# Patient Record
Sex: Female | Born: 1935
Health system: Southern US, Community
[De-identification: ages and names within clinical notes are randomized; demographics above are authoritative.]

## PROBLEM LIST (undated history)

## (undated) DIAGNOSIS — H3530 Unspecified macular degeneration: Secondary | ICD-10-CM

## (undated) DIAGNOSIS — N200 Calculus of kidney: Secondary | ICD-10-CM

## (undated) DIAGNOSIS — IMO0002 Reserved for concepts with insufficient information to code with codable children: Secondary | ICD-10-CM

## (undated) DIAGNOSIS — K219 Gastro-esophageal reflux disease without esophagitis: Secondary | ICD-10-CM

## (undated) DIAGNOSIS — R0600 Dyspnea, unspecified: Secondary | ICD-10-CM

## (undated) DIAGNOSIS — I4891 Unspecified atrial fibrillation: Secondary | ICD-10-CM

## (undated) DIAGNOSIS — N390 Urinary tract infection, site not specified: Secondary | ICD-10-CM

## (undated) DIAGNOSIS — I509 Heart failure, unspecified: Secondary | ICD-10-CM

## (undated) DIAGNOSIS — H35039 Hypertensive retinopathy, unspecified eye: Secondary | ICD-10-CM

## (undated) DIAGNOSIS — I10 Essential (primary) hypertension: Secondary | ICD-10-CM

## (undated) DIAGNOSIS — R609 Edema, unspecified: Secondary | ICD-10-CM

## (undated) DIAGNOSIS — K8020 Calculus of gallbladder without cholecystitis without obstruction: Secondary | ICD-10-CM

## (undated) DIAGNOSIS — R229 Localized swelling, mass and lump, unspecified: Secondary | ICD-10-CM

## (undated) DIAGNOSIS — E119 Type 2 diabetes mellitus without complications: Secondary | ICD-10-CM

## (undated) DIAGNOSIS — E785 Hyperlipidemia, unspecified: Secondary | ICD-10-CM

## (undated) HISTORY — DX: Unspecified macular degeneration: H35.30

## (undated) HISTORY — DX: Heart failure, unspecified: I50.9

## (undated) HISTORY — PX: KNEE ARTHROSCOPY: SUR90

## (undated) HISTORY — DX: Hypertensive retinopathy, unspecified eye: H35.039

## (undated) HISTORY — PX: TONSILLECTOMY: SUR1361

---

## 2003-08-04 ENCOUNTER — Other Ambulatory Visit: Payer: Self-pay

## 2004-03-19 ENCOUNTER — Ambulatory Visit: Payer: Self-pay | Admitting: Family Medicine

## 2005-04-02 ENCOUNTER — Ambulatory Visit: Payer: Self-pay | Admitting: Family Medicine

## 2005-04-21 ENCOUNTER — Ambulatory Visit: Payer: Self-pay | Admitting: Gastroenterology

## 2005-05-26 ENCOUNTER — Emergency Department: Payer: Self-pay | Admitting: Emergency Medicine

## 2005-05-26 ENCOUNTER — Other Ambulatory Visit: Payer: Self-pay

## 2006-04-06 ENCOUNTER — Ambulatory Visit: Payer: Self-pay | Admitting: Family Medicine

## 2007-04-08 ENCOUNTER — Ambulatory Visit: Payer: Self-pay | Admitting: Family Medicine

## 2008-04-11 ENCOUNTER — Ambulatory Visit: Payer: Self-pay | Admitting: Family Medicine

## 2009-04-25 ENCOUNTER — Ambulatory Visit: Payer: Self-pay | Admitting: Family Medicine

## 2010-01-10 ENCOUNTER — Ambulatory Visit: Payer: Self-pay | Admitting: Gastroenterology

## 2010-01-14 LAB — PATHOLOGY REPORT

## 2010-05-16 ENCOUNTER — Ambulatory Visit: Payer: Self-pay | Admitting: Family Medicine

## 2011-07-22 ENCOUNTER — Ambulatory Visit: Payer: Self-pay | Admitting: Family Medicine

## 2012-08-12 ENCOUNTER — Ambulatory Visit: Payer: Self-pay | Admitting: Family Medicine

## 2013-08-29 ENCOUNTER — Emergency Department: Payer: Self-pay | Admitting: Emergency Medicine

## 2013-08-30 LAB — URINALYSIS, COMPLETE
RBC,UR: 11700 /HPF (ref 0–5)
Specific Gravity: 1.011 (ref 1.003–1.030)
Squamous Epithelial: NONE SEEN
WBC UR: 526 /HPF (ref 0–5)

## 2013-08-30 LAB — CBC
HCT: 37.4 % (ref 35.0–47.0)
HGB: 12.4 g/dL (ref 12.0–16.0)
MCH: 29.5 pg (ref 26.0–34.0)
MCHC: 33.1 g/dL (ref 32.0–36.0)
MCV: 89 fL (ref 80–100)
Platelet: 230 10*3/uL (ref 150–440)
RBC: 4.21 10*6/uL (ref 3.80–5.20)
RDW: 14.2 % (ref 11.5–14.5)
WBC: 15.8 10*3/uL — ABNORMAL HIGH (ref 3.6–11.0)

## 2013-08-30 LAB — BASIC METABOLIC PANEL
Anion Gap: 4 — ABNORMAL LOW (ref 7–16)
BUN: 18 mg/dL (ref 7–18)
Calcium, Total: 8.9 mg/dL (ref 8.5–10.1)
Chloride: 96 mmol/L — ABNORMAL LOW (ref 98–107)
Co2: 32 mmol/L (ref 21–32)
Creatinine: 1.14 mg/dL (ref 0.60–1.30)
EGFR (African American): 53 — ABNORMAL LOW
EGFR (Non-African Amer.): 45 — ABNORMAL LOW
Glucose: 107 mg/dL — ABNORMAL HIGH (ref 65–99)
Osmolality: 267 (ref 275–301)
Potassium: 3.3 mmol/L — ABNORMAL LOW (ref 3.5–5.1)
Sodium: 132 mmol/L — ABNORMAL LOW (ref 136–145)

## 2013-09-01 LAB — URINE CULTURE

## 2013-11-24 ENCOUNTER — Ambulatory Visit: Payer: Self-pay | Admitting: Family Medicine

## 2014-05-08 DIAGNOSIS — E119 Type 2 diabetes mellitus without complications: Secondary | ICD-10-CM | POA: Insufficient documentation

## 2014-05-08 DIAGNOSIS — K219 Gastro-esophageal reflux disease without esophagitis: Secondary | ICD-10-CM | POA: Insufficient documentation

## 2014-11-06 ENCOUNTER — Other Ambulatory Visit: Payer: Self-pay | Admitting: Family Medicine

## 2014-11-06 DIAGNOSIS — Z1231 Encounter for screening mammogram for malignant neoplasm of breast: Secondary | ICD-10-CM

## 2014-11-29 ENCOUNTER — Other Ambulatory Visit: Payer: Self-pay | Admitting: Family Medicine

## 2014-11-29 ENCOUNTER — Ambulatory Visit
Admission: RE | Admit: 2014-11-29 | Discharge: 2014-11-29 | Disposition: A | Discharge status: Home / Self Care | Payer: Medicare Other | Source: Ambulatory Visit | Attending: Family Medicine | Admitting: Family Medicine

## 2014-11-29 DIAGNOSIS — Z1231 Encounter for screening mammogram for malignant neoplasm of breast: Secondary | ICD-10-CM | POA: Diagnosis present

## 2015-05-23 DIAGNOSIS — E785 Hyperlipidemia, unspecified: Secondary | ICD-10-CM | POA: Insufficient documentation

## 2015-10-14 ENCOUNTER — Encounter: Payer: Self-pay | Admitting: Emergency Medicine

## 2015-10-14 ENCOUNTER — Emergency Department: Payer: Medicare Other

## 2015-10-14 ENCOUNTER — Emergency Department
Admission: EM | Admit: 2015-10-14 | Discharge: 2015-10-14 | Disposition: A | Discharge status: Home / Self Care | Payer: Medicare Other | Attending: Emergency Medicine | Admitting: Emergency Medicine

## 2015-10-14 DIAGNOSIS — R531 Weakness: Secondary | ICD-10-CM | POA: Diagnosis not present

## 2015-10-14 DIAGNOSIS — E119 Type 2 diabetes mellitus without complications: Secondary | ICD-10-CM | POA: Diagnosis not present

## 2015-10-14 DIAGNOSIS — R197 Diarrhea, unspecified: Secondary | ICD-10-CM | POA: Insufficient documentation

## 2015-10-14 DIAGNOSIS — I10 Essential (primary) hypertension: Secondary | ICD-10-CM | POA: Insufficient documentation

## 2015-10-14 HISTORY — DX: Urinary tract infection, site not specified: N39.0

## 2015-10-14 HISTORY — DX: Type 2 diabetes mellitus without complications: E11.9

## 2015-10-14 HISTORY — DX: Calculus of kidney: N20.0

## 2015-10-14 HISTORY — DX: Essential (primary) hypertension: I10

## 2015-10-14 LAB — LIPASE, BLOOD: Lipase: 33 U/L (ref 11–51)

## 2015-10-14 LAB — URINALYSIS COMPLETE WITH MICROSCOPIC (ARMC ONLY)
Bacteria, UA: NONE SEEN
Bilirubin Urine: NEGATIVE
Glucose, UA: NEGATIVE mg/dL
Hgb urine dipstick: NEGATIVE
Ketones, ur: NEGATIVE mg/dL
Leukocytes, UA: NEGATIVE
Nitrite: NEGATIVE
Protein, ur: NEGATIVE mg/dL
Specific Gravity, Urine: 1.004 — ABNORMAL LOW (ref 1.005–1.030)
WBC, UA: NONE SEEN WBC/hpf (ref 0–5)
pH: 6 (ref 5.0–8.0)

## 2015-10-14 LAB — COMPREHENSIVE METABOLIC PANEL
ALT: 20 U/L (ref 14–54)
AST: 24 U/L (ref 15–41)
Albumin: 3.6 g/dL (ref 3.5–5.0)
Alkaline Phosphatase: 144 U/L — ABNORMAL HIGH (ref 38–126)
Anion gap: 10 (ref 5–15)
BUN: 16 mg/dL (ref 6–20)
CO2: 26 mmol/L (ref 22–32)
Calcium: 9.1 mg/dL (ref 8.9–10.3)
Chloride: 91 mmol/L — ABNORMAL LOW (ref 101–111)
Creatinine, Ser: 1.06 mg/dL — ABNORMAL HIGH (ref 0.44–1.00)
GFR calc Af Amer: 55 mL/min — ABNORMAL LOW (ref >60)
GFR calc non Af Amer: 48 mL/min — ABNORMAL LOW (ref >60)
Glucose, Bld: 151 mg/dL — ABNORMAL HIGH (ref 65–99)
Potassium: 3.7 mmol/L (ref 3.5–5.1)
Sodium: 127 mmol/L — ABNORMAL LOW (ref 135–145)
Total Bilirubin: 0.5 mg/dL (ref 0.3–1.2)
Total Protein: 7.2 g/dL (ref 6.5–8.1)

## 2015-10-14 LAB — CBC
HCT: 36 % (ref 35.0–47.0)
Hemoglobin: 12.4 g/dL (ref 12.0–16.0)
MCH: 29.6 pg (ref 26.0–34.0)
MCHC: 34.4 g/dL (ref 32.0–36.0)
MCV: 86.2 fL (ref 80.0–100.0)
Platelets: 498 10*3/uL — ABNORMAL HIGH (ref 150–440)
RBC: 4.18 MIL/uL (ref 3.80–5.20)
RDW: 14.4 % (ref 11.5–14.5)
WBC: 13.4 10*3/uL — ABNORMAL HIGH (ref 3.6–11.0)

## 2015-10-14 MED ORDER — SODIUM CHLORIDE 0.9 % IV SOLN
1000.0000 mL | Freq: Once | INTRAVENOUS | Status: AC
  Administered 2015-10-14: 1000 mL via INTRAVENOUS

## 2015-10-14 NOTE — ED Notes (Signed)
MD Kinner at bedside  

## 2015-10-14 NOTE — ED Provider Notes (Signed)
[facility] Emergency Department Provider Note  ____________________________________________    I have reviewed the triage vital signs and the nursing notes.   HISTORY  Chief Complaint Abdominal Pain and Diarrhea    HPI Victoria Dalton is a 82 y.o. female who presents with complaints of fatigue, malaise for nearly 3 weeks. She reports she seen her doctor twice for this and has been on antibiotics for possible urinary tract infection with little improvement. She notes over the last several days she has also had diarrhea. She denies abdominal pain, no cough, no chest pain, no shortness of breath. No rash. No dysuria. No recent travel.     Past Medical History  Diagnosis Date  . Hypertension   . Diabetes mellitus without complication ([place])   . Kidney stone   . UTI (lower urinary tract infection)     There are no active problems to display for this patient.   Past Surgical History  Procedure Laterality Date  . Knee arthroscopy Left   . Tonsillectomy      No current outpatient prescriptions on file.  Allergies Penicillins and Sulfa antibiotics  History reviewed. No pertinent family history.  Social History Social History  Substance Use Topics  . Smoking status: Never Smoker   . Smokeless tobacco: None  . Alcohol Use: No    Review of Systems  Constitutional: Positive for chills Eyes: Negative for redness ENT: Negative for sore throat Cardiovascular: Negative for chest pain Respiratory: Negative for shortness of breath. Negative for cough Gastrointestinal: Negative for abdominal pain, positive for diarrhea Genitourinary: Negative for dysuria. Musculoskeletal: Negative for back pain. Skin: Negative for rash. Neurological: Negative for focal weakness Psychiatric: no anxiety    ____________________________________________   PHYSICAL EXAM:  VITAL SIGNS: ED Triage Vitals  Enc Vitals Group     BP 10/14/15 0646 124/76 mmHg      Pulse Rate 10/14/15 0646 78     Resp 10/14/15 0646 18     Temp 10/14/15 0646 97.7 F (36.5 C)     Temp Source 10/14/15 0646 Oral     SpO2 10/14/15 0646 96 %     Weight 10/14/15 0646 244 lb (110.678 kg)     Height 10/14/15 0646 5\' 9"  (1.753 m)     Head Cir --      Peak Flow --      Pain Score 10/14/15 0647 0     Pain Loc --      Pain Edu? --      Excl. in [place]? --      Constitutional: Alert and oriented. No acute distress Eyes: Conjunctivae are normal. No erythema or injection ENT   Head: Normocephalic and atraumatic.   Mouth/Throat: Mucous membranes are moist. Cardiovascular: Normal rate, regular rhythm. Normal and symmetric distal pulses are present in the upper extremities.  Respiratory: Normal respiratory effort without tachypnea nor retractions. Breath sounds are clear and equal bilaterally.  Gastrointestinal: Soft and non-tender in all quadrants. No distention. There is no CVA tenderness. Genitourinary: deferred Musculoskeletal: Nontender with normal range of motion in all extremities. No lower extremity tenderness nor edema. Neurologic:  Normal speech and language. No gross focal neurologic deficits are appreciated. Skin:  Skin is warm, dry and intact. No rash noted. Psychiatric: Mood and affect are normal. Patient exhibits appropriate insight and judgment.  ____________________________________________    LABS (pertinent positives/negatives)  Labs Reviewed  CBC - Abnormal; Notable for the following:    WBC 13.4 (*)    Platelets 498 (*)  All other components within normal limits  GASTROINTESTINAL PANEL BY PCR, STOOL (REPLACES STOOL CULTURE)  LIPASE, BLOOD  COMPREHENSIVE METABOLIC PANEL  URINALYSIS COMPLETEWITH MICROSCOPIC ([facility] ONLY)  TROPONIN I    ____________________________________________   EKG  None  ____________________________________________    RADIOLOGY  Chest x-ray is  benign  ____________________________________________   PROCEDURES  Procedure(s) performed: none  Critical Care performed: none  ____________________________________________   INITIAL IMPRESSION / ASSESSMENT AND PLAN / ED COURSE  Pertinent labs & imaging results that were available during my care of the patient were reviewed by me and considered in my medical decision making (see chart for details).  Patient presented with complaints of generalized malaise for nearly 3 weeks. She is also having diarrhea, and has been on antibiotics for possible urinary tract infection. We will check labs, urine, x-ray, stool and reevaluate  On reevaluation patient is feeling well and improved from initial. Her lab work is overall unremarkable. She does have a mildly low sodium but this is in keeping with prior sodiums. No evidence of infection on chest x-ray or urinalysis. Patient was unable to give [place] any stool. She has follow-up with her PCP in one day. Feel this is appropriate for discharge  ____________________________________________   FINAL CLINICAL IMPRESSION(S) / ED DIAGNOSES  Final diagnoses:  Weakness  Diarrhea, unspecified type          Lavonia Drafts, MD 10/14/15 1536

## 2015-10-14 NOTE — Discharge Instructions (Signed)

## 2015-10-14 NOTE — ED Notes (Signed)
Pt assisted into wheelchair upon arrival; says she's been feeling bad since 09/25/15; has been seen by her provider and is on her second round of antibiotics for UTI; took Cipro first and now taking levaquin-2 doses left; pt says last night she began having liquid stools, has had multiple since it started; burning abd pain; c/o generalized weakness; pt pale in triage; talkative;

## 2015-10-14 NOTE — ED Notes (Signed)
Pt's daughter states the pt has had significant weight loss since 5/3 when she weighed 262  Now weights 244 today.

## 2015-11-12 ENCOUNTER — Encounter: Payer: Self-pay | Admitting: Emergency Medicine

## 2015-11-12 ENCOUNTER — Other Ambulatory Visit: Payer: Self-pay

## 2015-11-12 ENCOUNTER — Emergency Department
Admission: EM | Admit: 2015-11-12 | Discharge: 2015-11-12 | Disposition: A | Discharge status: Home / Self Care | Payer: Medicare Other | Attending: Emergency Medicine | Admitting: Emergency Medicine

## 2015-11-12 DIAGNOSIS — Z048 Encounter for examination and observation for other specified reasons: Secondary | ICD-10-CM | POA: Insufficient documentation

## 2015-11-12 DIAGNOSIS — E119 Type 2 diabetes mellitus without complications: Secondary | ICD-10-CM | POA: Diagnosis not present

## 2015-11-12 DIAGNOSIS — R0989 Other specified symptoms and signs involving the circulatory and respiratory systems: Secondary | ICD-10-CM | POA: Diagnosis not present

## 2015-11-12 DIAGNOSIS — I10 Essential (primary) hypertension: Secondary | ICD-10-CM | POA: Diagnosis not present

## 2015-11-12 DIAGNOSIS — R198 Other specified symptoms and signs involving the digestive system and abdomen: Secondary | ICD-10-CM

## 2015-11-12 DIAGNOSIS — R109 Unspecified abdominal pain: Secondary | ICD-10-CM | POA: Diagnosis present

## 2015-11-12 LAB — CBC
HCT: 35.1 % (ref 35.0–47.0)
Hemoglobin: 11.7 g/dL — ABNORMAL LOW (ref 12.0–16.0)
MCH: 28.6 pg (ref 26.0–34.0)
MCHC: 33.4 g/dL (ref 32.0–36.0)
MCV: 85.6 fL (ref 80.0–100.0)
Platelets: 380 10*3/uL (ref 150–440)
RBC: 4.1 MIL/uL (ref 3.80–5.20)
RDW: 14.3 % (ref 11.5–14.5)
WBC: 13.6 10*3/uL — ABNORMAL HIGH (ref 3.6–11.0)

## 2015-11-12 LAB — COMPREHENSIVE METABOLIC PANEL
ALT: 339 U/L — ABNORMAL HIGH (ref 14–54)
AST: 436 U/L — ABNORMAL HIGH (ref 15–41)
Albumin: 3.8 g/dL (ref 3.5–5.0)
Alkaline Phosphatase: 263 U/L — ABNORMAL HIGH (ref 38–126)
Anion gap: 10 (ref 5–15)
BUN: 22 mg/dL — ABNORMAL HIGH (ref 6–20)
CO2: 27 mmol/L (ref 22–32)
Calcium: 9.2 mg/dL (ref 8.9–10.3)
Chloride: 98 mmol/L — ABNORMAL LOW (ref 101–111)
Creatinine, Ser: 1.04 mg/dL — ABNORMAL HIGH (ref 0.44–1.00)
GFR calc Af Amer: 56 mL/min — ABNORMAL LOW (ref >60)
GFR calc non Af Amer: 49 mL/min — ABNORMAL LOW (ref >60)
Glucose, Bld: 158 mg/dL — ABNORMAL HIGH (ref 65–99)
Potassium: 3.3 mmol/L — ABNORMAL LOW (ref 3.5–5.1)
Sodium: 135 mmol/L (ref 135–145)
Total Bilirubin: 3.4 mg/dL — ABNORMAL HIGH (ref 0.3–1.2)
Total Protein: 7.2 g/dL (ref 6.5–8.1)

## 2015-11-12 LAB — TROPONIN I: Troponin I: <0.03 ng/mL (ref <0.031)

## 2015-11-12 LAB — LIPASE, BLOOD: Lipase: 33 U/L (ref 11–51)

## 2015-11-12 NOTE — ED Provider Notes (Signed)
[facility] Emergency Department Provider Note  ____________________________________________  Time seen: Approximately 6:37 PM  I have reviewed the triage vital signs and the nursing notes.   HISTORY  Chief Complaint Abdominal Pain    HPI Victoria Dalton is a 82 y.o. female with a history of frequent abdominal issues who presents for evaluation of intermittent episodes of burping for several months.  Her primary care physician is Dr. Gwynneth Aliment and she has seen Dr. Rayann Heman with gastroenterology in the past.  She reports that after eating at a cookout last night she had a "full" feeling which she tried drinking some Pepsi but it did not resolve her feeling of needing to belch.  After eating a biscuit this morning she felt the same sensation and had the feeling that something was caught in her throat.  She has not been interested in eating today as a result.  She describes the symptoms as severe although she is in no acute distress at the moment.  She denies abdominal pain, chest pain, shortness of breath, fever/chills, cough.  She states it is similar to the symptoms she has had multiple times in the past but with the added sensation that something was stuck in her throat.  That sensation has since passed.  Nothing particular makes her symptoms better nor worse.   Past Medical History  Diagnosis Date  . Hypertension   . Diabetes mellitus without complication ([place])   . Kidney stone   . UTI (lower urinary tract infection)     There are no active problems to display for this patient.   Past Surgical History  Procedure Laterality Date  . Knee arthroscopy Left   . Tonsillectomy      No current outpatient prescriptions on file.  Allergies Penicillins and Sulfa antibiotics  No family history on file.  Social History Social History  Substance Use Topics  . Smoking status: Never Smoker   . Smokeless tobacco: None  . Alcohol Use: No    Review of  Systems Constitutional: No fever/chills Eyes: No visual changes. ENT: No sore throat. Cardiovascular: Denies chest pain. Respiratory: Denies shortness of breath. Gastrointestinal: No abdominal pain.  No nausea, no vomiting.  No diarrhea.  No constipation.  Feels like something was stuck in her throat and that she needed to belch but could not.   Genitourinary: Negative for dysuria. Musculoskeletal: Negative for back pain. Skin: Negative for rash. Neurological: Negative for headaches, focal weakness or numbness.  10-point ROS otherwise negative.  ____________________________________________   PHYSICAL EXAM:  VITAL SIGNS: ED Triage Vitals  Enc Vitals Group     BP 11/12/15 1639 112/56 mmHg     Pulse Rate 11/12/15 1639 86     Resp 11/12/15 1639 18     Temp 11/12/15 1639 98.7 F (37.1 C)     Temp Source 11/12/15 1639 Oral     SpO2 11/12/15 1639 95 %     Weight 11/12/15 1639 243 lb (110.224 kg)     Height 11/12/15 1639 5\' 8"  (1.727 m)     Head Cir --      Peak Flow --      Pain Score 11/12/15 1650 0     Pain Loc --      Pain Edu? --      Excl. in [place]? --     Constitutional: Alert and oriented. Well appearing and in no acute distress. Eyes: Conjunctivae are normal. PERRL. EOMI. Head: Atraumatic. Nose: No congestion/rhinnorhea. Mouth/Throat: Mucous membranes  are moist.  Oropharynx non-erythematous. Neck: No stridor.  No meningeal signs.   Cardiovascular: Normal rate, regular rhythm. Good peripheral circulation. Grossly normal heart sounds.   Respiratory: Normal respiratory effort.  No retractions. Lungs CTAB. Gastrointestinal: Obese.  Soft and nontender. No distention.  Musculoskeletal: No lower extremity tenderness nor edema. No gross deformities of extremities. Neurologic:  Normal speech and language. No gross focal neurologic deficits are appreciated.  Skin:  Skin is warm, dry and intact. No rash noted. Psychiatric: Mood and affect are normal. Speech and behavior are  normal.  ____________________________________________   LABS (all labs ordered are listed, but only abnormal results are displayed)  Labs Reviewed  COMPREHENSIVE METABOLIC PANEL - Abnormal; Notable for the following:    Potassium 3.3 (*)    Chloride 98 (*)    Glucose, Bld 158 (*)    BUN 22 (*)    Creatinine, Ser 1.04 (*)    AST 436 (*)    ALT 339 (*)    Alkaline Phosphatase 263 (*)    Total Bilirubin 3.4 (*)    GFR calc non Af Amer 49 (*)    GFR calc Af Amer 56 (*)    All other components within normal limits  CBC - Abnormal; Notable for the following:    WBC 13.6 (*)    Hemoglobin 11.7 (*)    All other components within normal limits  LIPASE, BLOOD  TROPONIN I   ____________________________________________  EKG  ED ECG REPORT I, Tyjah Hai, the attending physician, personally viewed and interpreted this ECG.  Date: 11/12/2015 EKG Time: 17:04 Rate: 84 Rhythm: normal sinus rhythm QRS Axis: normal Intervals: normal ST/T Wave abnormalities: normal Conduction Disturbances: none Narrative Interpretation: unremarkable  ____________________________________________  RADIOLOGY   No results found.  ____________________________________________   PROCEDURES  Procedure(s) performed:   Procedures   ____________________________________________   INITIAL IMPRESSION / ASSESSMENT AND PLAN / ED COURSE  Pertinent labs & imaging results that were available during my care of the patient were reviewed by me and considered in my medical decision making (see chart for details).  The patient is in no acute distress, tolerating her own secretions without difficulty, and cheerful and appropriately interactive during our interview.  There is no evidence of any esophageal obstruction at this time.  He has no abdominal pain nor tenderness.  I encouraged her to stick with a soft or liquid diet and follow up with her primary care doctor at the next available opportunity.  I am  also providing the name and contact information for Dr. Allen Norris since Dr. Rayann Heman is no longer in the area.  I encouraged her to follow up at the next available opportunity.  She and her daughter understand and agree with the plan.  ____________________________________________  FINAL CLINICAL IMPRESSION(S) / ED DIAGNOSES  Final diagnoses:  Globus sensation     MEDICATIONS GIVEN DURING THIS VISIT:  Medications - No data to display   NEW OUTPATIENT MEDICATIONS STARTED DURING THIS VISIT:  New Prescriptions   No medications on file      Note:  This document was prepared using Dragon voice recognition software and may include unintentional dictation errors.   Hinda Kehr, MD 11/12/15 [phone]

## 2015-11-12 NOTE — Discharge Instructions (Signed)
As we discussed, your workup today was reassuring.  Though we do not know exactly what is causing your symptoms, it appears that you have no emergent medical condition at this time are safe to go home and follow up as recommended in this paperwork.  We encourage you to stick with a soft or liquid diet until you have the opportunity to follow up.  Continue taking your regular medications.  Please return immediately to the Emergency Department if you develop any new or worsening symptoms that concern you.   Dysphagia Swallowing problems (dysphagia) occur when solids and liquids seem to stick in your throat on the way down to your stomach, or the food takes longer to get to the stomach. Other symptoms include regurgitating food, noises coming from the throat, chest discomfort with swallowing, and a feeling of fullness or the feeling of something being stuck in your throat when swallowing. When blockage in your throat is complete, it may be associated with drooling. CAUSES  Problems with swallowing may occur because of problems with the muscles. The food cannot be propelled in the usual manner into your stomach. You may have ulcers, scar tissue, or inflammation in the tube down which food travels from your mouth to your stomach (esophagus), which blocks food from passing normally into the stomach. Causes of inflammation include:  Acid reflux from your stomach into your esophagus.  Infection.  Radiation treatment for cancer.  Medicines taken without enough fluids to wash them down into your stomach. You may have nerve problems that prevent signals from being sent to the muscles of your esophagus to contract and move your food down to your stomach. Globus pharyngeus is a relatively common problem in which there is a sense of an obstruction or difficulty in swallowing, without any physical abnormalities of the swallowing passages being found. This problem usually improves over time with reassurance and  testing to rule out other causes. DIAGNOSIS Dysphagia can be diagnosed and its cause can be determined by tests in which you swallow a white substance that helps illuminate the inside of your throat (contrast medium) while X-rays are taken. Sometimes a flexible telescope that is inserted down your throat (endoscopy) to look at your esophagus and stomach is used. TREATMENT   If the dysphagia is caused by acid reflux or infection, medicines may be used.  If the dysphagia is caused by problems with your swallowing muscles, swallowing therapy may be used to help you strengthen your swallowing muscles.  If the dysphagia is caused by a blockage or mass, procedures to remove the blockage may be done. HOME CARE INSTRUCTIONS  Try to eat soft food that is easier to swallow and check your weight on a daily basis to be sure that it is not decreasing.  Be sure to drink liquids when sitting upright (not lying down). SEEK MEDICAL CARE IF:  You are losing weight because you are unable to swallow.  You are coughing when you drink liquids (aspiration).  You are coughing up partially digested food. SEEK IMMEDIATE MEDICAL CARE IF:  You are unable to swallow your own saliva .  You are having shortness of breath or a fever, or both.  You have a hoarse voice along with difficulty swallowing. MAKE SURE YOU:  Understand these instructions.  Will watch your condition.  Will get help right away if you are not doing well or get worse.   This information is not intended to replace advice given to you by your health care provider.  Make sure you discuss any questions you have with your health care provider.   Document Released: 05/09/2000 Document Revised: 06/02/2014 Document Reviewed: 10/29/2012 Elsevier Interactive Patient Education [employer].

## 2015-11-12 NOTE — ED Notes (Signed)
Patient has been having intermittent episodes of burping for the past several months.  Has been seen by  Dr. Gwynneth Aliment who is following her.  Patient went to a cookout last night and ate, patient felt a "full" feeling and went home and had some Pepsi, which did not resolve the feeling of needing to belch.  This morning patient had some biscuit and experienced the same sensation of feeling full and like "something was stuck" in esophagus.    Patient continue to c/o feeling "full"

## 2015-11-16 ENCOUNTER — Emergency Department: Payer: Medicare Other

## 2015-11-16 ENCOUNTER — Encounter: Payer: Self-pay | Admitting: *Deleted

## 2015-11-16 ENCOUNTER — Emergency Department
Admission: EM | Admit: 2015-11-16 | Discharge: 2015-11-16 | Disposition: A | Discharge status: Home / Self Care | Payer: Medicare Other | Attending: Student | Admitting: Student

## 2015-11-16 DIAGNOSIS — E119 Type 2 diabetes mellitus without complications: Secondary | ICD-10-CM | POA: Insufficient documentation

## 2015-11-16 DIAGNOSIS — R1310 Dysphagia, unspecified: Secondary | ICD-10-CM | POA: Diagnosis not present

## 2015-11-16 DIAGNOSIS — I10 Essential (primary) hypertension: Secondary | ICD-10-CM | POA: Diagnosis not present

## 2015-11-16 LAB — COMPREHENSIVE METABOLIC PANEL
ALT: 158 U/L — ABNORMAL HIGH (ref 14–54)
AST: 103 U/L — ABNORMAL HIGH (ref 15–41)
Albumin: 3.4 g/dL — ABNORMAL LOW (ref 3.5–5.0)
Alkaline Phosphatase: 281 U/L — ABNORMAL HIGH (ref 38–126)
Anion gap: 10 (ref 5–15)
BUN: 16 mg/dL (ref 6–20)
CO2: 28 mmol/L (ref 22–32)
Calcium: 9.1 mg/dL (ref 8.9–10.3)
Chloride: 93 mmol/L — ABNORMAL LOW (ref 101–111)
Creatinine, Ser: 1.01 mg/dL — ABNORMAL HIGH (ref 0.44–1.00)
GFR calc Af Amer: 58 mL/min — ABNORMAL LOW (ref >60)
GFR calc non Af Amer: 50 mL/min — ABNORMAL LOW (ref >60)
Glucose, Bld: 156 mg/dL — ABNORMAL HIGH (ref 65–99)
Potassium: 3.3 mmol/L — ABNORMAL LOW (ref 3.5–5.1)
Sodium: 131 mmol/L — ABNORMAL LOW (ref 135–145)
Total Bilirubin: 2.4 mg/dL — ABNORMAL HIGH (ref 0.3–1.2)
Total Protein: 7.1 g/dL (ref 6.5–8.1)

## 2015-11-16 LAB — CBC WITH DIFFERENTIAL/PLATELET
Basophils Absolute: 0.1 10*3/uL (ref 0–0.1)
Basophils Relative: 1 %
Eosinophils Absolute: 0.2 10*3/uL (ref 0–0.7)
Eosinophils Relative: 2 %
HCT: 33.3 % — ABNORMAL LOW (ref 35.0–47.0)
Hemoglobin: 11.6 g/dL — ABNORMAL LOW (ref 12.0–16.0)
Lymphocytes Relative: 11 %
Lymphs Abs: 1.1 10*3/uL (ref 1.0–3.6)
MCH: 29.3 pg (ref 26.0–34.0)
MCHC: 34.8 g/dL (ref 32.0–36.0)
MCV: 84.3 fL (ref 80.0–100.0)
Monocytes Absolute: 0.5 10*3/uL (ref 0.2–0.9)
Monocytes Relative: 5 %
Neutro Abs: 8.1 10*3/uL — ABNORMAL HIGH (ref 1.4–6.5)
Neutrophils Relative %: 81 %
Platelets: 369 10*3/uL (ref 150–440)
RBC: 3.95 MIL/uL (ref 3.80–5.20)
RDW: 14.6 % — ABNORMAL HIGH (ref 11.5–14.5)
WBC: 10 10*3/uL (ref 3.6–11.0)

## 2015-11-16 MED ORDER — GI COCKTAIL ~~LOC~~
30.0000 mL | Freq: Once | ORAL | Status: AC
  Administered 2015-11-16: 30 mL via ORAL
  Filled 2015-11-16: qty 30

## 2015-11-16 MED ORDER — LIDOCAINE VISCOUS 2 % MT SOLN: 20.0000 mL | OROMUCOSAL | Status: DC | PRN

## 2015-11-16 MED ORDER — METOCLOPRAMIDE HCL 10 MG PO TABS: 10.0000 mg | ORAL_TABLET | Freq: Three times a day (TID) | ORAL | Status: DC | PRN

## 2015-11-16 NOTE — ED Notes (Addendum)
Pt c/o dysphagia x56month, was seen here Monday and d/c with follow up but can't get in quickly. Pt hasn't been eating or drinking all week. Pt can swallow denies food getting stuck while eating.  Pt denies any CP or SOB.

## 2015-11-16 NOTE — ED Provider Notes (Signed)
[facility] Emergency Department Provider Note        Time seen: ----------------------------------------- 1:03 PM on 11/16/2015 -----------------------------------------    I have reviewed the triage vital signs and the nursing notes.   HISTORY  Chief Complaint Dysphagia    HPI Victoria Dalton is a 82 y.o. female who presents to ER for dysphagia for the last month. Patient was seen here on Monday was diagnosed with foreign body sensation. She was referred to gastroenterology for follow-up but she couldn't get in until July. She hasn't been eating or drinking well all week. She can swallow but complains of discomfort with swallowing and feels like something is in her throat. She denies any chest pain or difficulty breathing.   Past Medical History  Diagnosis Date  . Hypertension   . Diabetes mellitus without complication ([place])   . Kidney stone   . UTI (lower urinary tract infection)     There are no active problems to display for this patient.   Past Surgical History  Procedure Laterality Date  . Knee arthroscopy Left   . Tonsillectomy      Allergies Penicillins and Sulfa antibiotics  Social History Social History  Substance Use Topics  . Smoking status: Never Smoker   . Smokeless tobacco: None  . Alcohol Use: No    Review of Systems Constitutional: Negative for fever. ENT: Positive for dysphagia Cardiovascular: Negative for chest pain. Respiratory: Negative for shortness of breath. Gastrointestinal: Negative for abdominal pain, vomiting and diarrhea. Genitourinary: Negative for dysuria. Musculoskeletal: Negative for back pain. Skin: Negative for rash. Neurological: Negative for headaches, focal weakness or numbness.  10-point ROS otherwise negative.  ____________________________________________   PHYSICAL EXAM:  VITAL SIGNS: ED Triage Vitals  Enc Vitals Group     BP 11/16/15 1250 121/103 mmHg     Pulse Rate 11/16/15  1250 72     Resp 11/16/15 1250 20     Temp 11/16/15 1250 98.1 F (36.7 C)     Temp Source 11/16/15 1250 Oral     SpO2 11/16/15 1250 96 %     Weight 11/16/15 1250 242 lb (109.77 kg)     Height 11/16/15 1250 5\' 8"  (1.727 m)     Head Cir --      Peak Flow --      Pain Score --      Pain Loc --      Pain Edu? --      Excl. in [place]? --     Constitutional: Alert and oriented. Well appearing and in no distress. Eyes: Conjunctivae are normal. PERRL. Normal extraocular movements. ENT   Head: Normocephalic and atraumatic.   Nose: No congestion/rhinnorhea.   Mouth/Throat: Mucous membranes are moist.   Neck: No stridor. Cardiovascular: Normal rate, regular rhythm. No murmurs, rubs, or gallops. Respiratory: Normal respiratory effort without tachypnea nor retractions. Breath sounds are clear and equal bilaterally. No wheezes/rales/rhonchi. Gastrointestinal: Soft and nontender. Normal bowel sounds Musculoskeletal: Nontender with normal range of motion in all extremities. No lower extremity tenderness nor edema. Neurologic:  Normal speech and language. No gross focal neurologic deficits are appreciated.  Skin:  Skin is warm, dry and intact. No rash noted. Psychiatric: Mood and affect are normal. Speech and behavior are normal.  ____________________________________________  ED COURSE:  Pertinent labs & imaging results that were available during my care of the patient were reviewed by me and considered in my medical decision making (see chart for details). Patient resents to ER with persistent  dysphagia and trouble swallowing. She'll receive a GI cocktail, I will assess with upper GI. ____________________________________________    LABS (pertinent positives/negatives)  Labs Reviewed  CBC WITH DIFFERENTIAL/PLATELET - Abnormal; Notable for the following:    Hemoglobin 11.6 (*)    HCT 33.3 (*)    RDW 14.6 (*)    Neutro Abs 8.1 (*)    All other components within normal limits   COMPREHENSIVE METABOLIC PANEL - Abnormal; Notable for the following:    Sodium 131 (*)    Potassium 3.3 (*)    Chloride 93 (*)    Glucose, Bld 156 (*)    Creatinine, Ser 1.01 (*)    Albumin 3.4 (*)    AST 103 (*)    ALT 158 (*)    Alkaline Phosphatase 281 (*)    Total Bilirubin 2.4 (*)    GFR calc non Af Amer 50 (*)    GFR calc Af Amer 58 (*)    All other components within normal limits    RADIOLOGY Images were viewed by me  Upper GI IMPRESSION: Normal barium swallow. ____________________________________________  FINAL ASSESSMENT AND PLAN  Dysphagia  Plan: Patient with labs and imaging as dictated above. Patient with dysphagia of uncertain etiology. It is possible that the etiology of something other than a physical issue. She is stable for outpatient follow-up with gastroenterology. I will prescribe viscous lidocaine and ensure that she is in proper and acids.   Earleen Newport, MD   Note: This dictation was prepared with Dragon dictation. Any transcriptional errors that result from this process are unintentional   Earleen Newport, MD 11/16/15 [phone]

## 2015-11-16 NOTE — ED Notes (Signed)
Pt. Going home with family. 

## 2015-11-16 NOTE — Discharge Instructions (Signed)

## 2015-11-16 NOTE — ED Notes (Signed)
Pt seen on Monday for possible esophogeal stricture, pt reports being unable to swallow food and liquid

## 2015-11-28 DIAGNOSIS — I10 Essential (primary) hypertension: Secondary | ICD-10-CM | POA: Diagnosis not present

## 2015-11-28 DIAGNOSIS — E119 Type 2 diabetes mellitus without complications: Secondary | ICD-10-CM | POA: Diagnosis not present

## 2015-11-28 DIAGNOSIS — E785 Hyperlipidemia, unspecified: Secondary | ICD-10-CM | POA: Diagnosis not present

## 2015-11-28 DIAGNOSIS — K219 Gastro-esophageal reflux disease without esophagitis: Secondary | ICD-10-CM | POA: Diagnosis not present

## 2015-12-06 ENCOUNTER — Ambulatory Visit: Payer: Self-pay | Admitting: Gastroenterology

## 2015-12-06 ENCOUNTER — Encounter: Payer: Self-pay | Admitting: Gastroenterology

## 2015-12-06 ENCOUNTER — Other Ambulatory Visit: Payer: Self-pay

## 2015-12-06 ENCOUNTER — Ambulatory Visit (INDEPENDENT_AMBULATORY_CARE_PROVIDER_SITE_OTHER): Payer: Medicare Other | Admitting: Gastroenterology

## 2015-12-06 VITALS — BP 141/69 | HR 67 | Temp 98.9°F | Ht 68.0 in | Wt 241.0 lb

## 2015-12-06 DIAGNOSIS — K219 Gastro-esophageal reflux disease without esophagitis: Secondary | ICD-10-CM | POA: Diagnosis not present

## 2015-12-06 DIAGNOSIS — F458 Other somatoform disorders: Secondary | ICD-10-CM | POA: Diagnosis not present

## 2015-12-06 DIAGNOSIS — I10 Essential (primary) hypertension: Secondary | ICD-10-CM | POA: Insufficient documentation

## 2015-12-06 MED ORDER — PANTOPRAZOLE SODIUM 40 MG PO TBEC: 40.0000 mg | DELAYED_RELEASE_TABLET | Freq: Every day | ORAL | Status: DC

## 2015-12-06 NOTE — Progress Notes (Signed)
Gastroenterology Consultation  Referring Provider:     Juluis Pitch, MD Primary Care Physician:  Juluis Pitch, MD Primary Gastroenterologist:  Dr. Allen Norris     Reason for Consultation:     Globus        HPI:   Victoria Dalton is a 82 y.o. y/o female referred for consultation & management of Globus by Dr. Juluis Pitch, MD.  This patient comes today after being seen in the emergency room for a feeling of fullness in her throat.  The patient has been seen by Dr. Gustavo Lah in the past and most recently by Dr. Rayann Heman.  The patient reports that she had lost approximately 20 pounds because of being scared to eat.  The patient also reports that she was having epigastric discomfort and nausea.  The patient had an upper GI that did not show any signs of reflux or abnormalities in the esophagus.  There is no report of any black stools or bloody stools. The patient is very frustrated because her symptoms have not been  Resolved despite being afflicted with them for the last 2 months.  She does report that she was started on Carafate but it was so expensive she did not get it filled.  The patient is presently on  Omeprazole.  She does report that she has been feeling better over the last week or so and has been starting to eat.  Past Medical History  Diagnosis Date  . Hypertension   . Diabetes mellitus without complication ([place])   . Kidney stone   . UTI (lower urinary tract infection)     Past Surgical History  Procedure Laterality Date  . Knee arthroscopy Left   . Tonsillectomy      Prior to Admission medications   Medication Sig Start Date End Date Taking? Authorizing Provider  glimepiride (AMARYL) 2 MG tablet Take by mouth. 12/22/14  Yes Historical Provider, MD  losartan-hydrochlorothiazide (HYZAAR) 100-25 MG tablet Take by mouth. 04/24/15  Yes Historical Provider, MD  metFORMIN (GLUCOPHAGE) 500 MG tablet Take by mouth. 11/02/15  Yes Historical Provider, MD  metFORMIN (GLUCOPHAGE) 500 MG  tablet Take 500 mg by mouth 3 (three) times daily. 11/02/15  Yes Historical Provider, MD  metoCLOPramide (REGLAN) 10 MG tablet Take 1 tablet (10 mg total) by mouth every 8 (eight) hours as needed for nausea or vomiting. 11/16/15  Yes Earleen Newport, MD  metoprolol succinate (TOPROL-XL) 100 MG 24 hr tablet Take by mouth. 11/06/15  Yes Historical Provider, MD  omeprazole (PRILOSEC) 20 MG capsule Take by mouth. 05/08/14  Yes Historical Provider, MD  ONE TOUCH ULTRA TEST test strip  11/02/15  Yes Historical Provider, MD  promethazine (PHENERGAN) 12.5 MG tablet Take by mouth. 09/29/15  Yes Historical Provider, MD  promethazine (PHENERGAN) 25 MG tablet take 1/2 tablet by mouth every 8 hours if needed for nausea or vomiting 09/29/15  Yes Historical Provider, MD  simvastatin (ZOCOR) 40 MG tablet Take by mouth. 10/12/15  Yes Historical Provider, MD  simvastatin (ZOCOR) 40 MG tablet Take 40 mg by mouth at bedtime. 10/12/15  Yes Historical Provider, MD  lidocaine (XYLOCAINE) 2 % solution Use as directed 20 mLs in the mouth or throat as needed (For throat pain). Patient not taking: Reported on 12/06/2015 11/16/15   Earleen Newport, MD  pantoprazole (PROTONIX) 40 MG tablet Take 1 tablet (40 mg total) by mouth daily. 12/06/15   Lucilla Lame, MD  sucralfate (CARAFATE) 1 GM/10ML suspension Take by mouth. Reported on  12/06/2015 11/28/15 11/27/16  Historical Provider, MD    History reviewed. No pertinent family history.   Social History  Substance Use Topics  . Smoking status: Never Smoker   . Smokeless tobacco: Never Used  . Alcohol Use: No    Allergies as of 12/06/2015 - Review Complete 12/06/2015  Allergen Reaction Noted  . Ramipril Other (See Comments) 12/06/2015  . Penicillins Rash 10/14/2015  . Sulfa antibiotics Rash 10/14/2015    Review of Systems:    All systems reviewed and negative except where noted in HPI.   Physical Exam:  BP 141/69 mmHg  Pulse 67  Temp(Src) 98.9 F (37.2 C) (Oral)  Ht 5\' 8"   (1.727 m)  Wt 241 lb (109.317 kg)  BMI 36.65 kg/m2 No LMP recorded. Patient is postmenopausal. Psych:  Alert and cooperative. Normal mood and affect. General:   Alert,  Well-developed, well-nourished, pleasant and cooperative in NAD Head:  Normocephalic and atraumatic. Eyes:  Sclera clear, no icterus.   Conjunctiva pink. Ears:  Normal auditory acuity. Nose:  No deformity, discharge, or lesions. Mouth:  No deformity or lesions,oropharynx pink & moist. Neck:  Supple; no masses or thyromegaly. Lungs:  Respirations even and unlabored.  Clear throughout to auscultation.   No wheezes, crackles, or rhonchi. No acute distress. Heart:  Regular rate and rhythm; no murmurs, clicks, rubs, or gallops. Abdomen:  Normal bowel sounds.  No bruits.  Soft, non-tender and non-distended without masses, hepatosplenomegaly or hernias noted.  No guarding or rebound tenderness.  Negative Carnett sign.   Rectal:  Deferred.  Msk:  Symmetrical without gross deformities.  Good, equal movement & strength bilaterally. Pulses:  Normal pulses noted. Extremities:  No clubbing or edema.  No cyanosis. Neurologic:  Alert and oriented x3;  grossly normal neurologically. Walks with a walker Skin:  Intact without significant lesions or rashes.  No jaundice. Lymph Nodes:  No significant cervical adenopathy. Psych:  Alert and cooperative. Normal mood and affect.  Imaging Studies: Dg Esophagus  11/16/2015  CLINICAL DATA:  Difficulty swallowing.  Unable to eat. EXAM: ESOPHOGRAM / BARIUM SWALLOW / BARIUM TABLET STUDY TECHNIQUE: Combined double contrast and single contrast examination performed using effervescent crystals, thick barium liquid, and thin barium liquid. The patient was observed with fluoroscopy swallowing a 13 mm barium sulphate tablet. FLUOROSCOPY TIME:  Radiation Exposure Index (as provided by the fluoroscopic device): 9.9 mGy COMPARISON:  None. FINDINGS: There was normal pharyngeal anatomy and motility. Contrast flowed  freely through the esophagus without evidence of stricture or mass. There was normal esophageal mucosa without evidence of irregularity or ulceration. Esophageal motility was normal. No evidence of reflux. No definite hiatal hernia was demonstrated. At the end of the examination a 13 mm barium tablet was administered which transited through the esophagus and esophagogastric junction without delay. IMPRESSION: Normal barium swallow. Electronically Signed   By: Kathreen Devoid   On: 11/16/2015 14:40    Assessment and Plan:   SELEN FESS is a 82 y.o. y/o female Who comes today with a history of a fullness in her throat.  The patient has been on Prilosec for reflux.  Her symptoms were evaluated with a esophageal barium swallow without any dysmotility ulcerations or irregularities seen.  There is also no hiatal hernia or reflux noted.  The patient will be started on a trial of Dexilant for 10 days and has been given a prescription for Protonix to see if these help her better than her omeprazole.  The patient does report that her  symptoms have been improving over the last week.  She will contact me if her symptoms do not continue to improve and if they do not she may need evaluation by ENT and possibly esophageal manometry.  The patient has been explained the plan and agrees with it.   Note: This dictation was prepared with Dragon dictation along with smaller phrase technology. Any transcriptional errors that result from this process are unintentional.

## 2015-12-13 DIAGNOSIS — R1310 Dysphagia, unspecified: Secondary | ICD-10-CM | POA: Diagnosis not present

## 2015-12-13 DIAGNOSIS — E119 Type 2 diabetes mellitus without complications: Secondary | ICD-10-CM | POA: Diagnosis not present

## 2015-12-13 DIAGNOSIS — Z882 Allergy status to sulfonamides status: Secondary | ICD-10-CM | POA: Diagnosis not present

## 2015-12-13 DIAGNOSIS — R12 Heartburn: Secondary | ICD-10-CM | POA: Diagnosis not present

## 2015-12-24 DIAGNOSIS — E119 Type 2 diabetes mellitus without complications: Secondary | ICD-10-CM | POA: Diagnosis not present

## 2015-12-24 DIAGNOSIS — T360X5S Adverse effect of penicillins, sequela: Secondary | ICD-10-CM | POA: Diagnosis not present

## 2015-12-24 DIAGNOSIS — E785 Hyperlipidemia, unspecified: Secondary | ICD-10-CM | POA: Diagnosis not present

## 2016-01-02 ENCOUNTER — Other Ambulatory Visit: Payer: Self-pay | Admitting: Internal Medicine

## 2016-01-02 DIAGNOSIS — Z1231 Encounter for screening mammogram for malignant neoplasm of breast: Secondary | ICD-10-CM

## 2016-01-09 DIAGNOSIS — K8020 Calculus of gallbladder without cholecystitis without obstruction: Secondary | ICD-10-CM | POA: Diagnosis not present

## 2016-01-09 DIAGNOSIS — K810 Acute cholecystitis: Secondary | ICD-10-CM | POA: Diagnosis not present

## 2016-01-10 ENCOUNTER — Observation Stay: Payer: Medicare Other

## 2016-01-10 ENCOUNTER — Emergency Department: Payer: Medicare Other

## 2016-01-10 ENCOUNTER — Other Ambulatory Visit: Payer: Self-pay | Admitting: Internal Medicine

## 2016-01-10 ENCOUNTER — Inpatient Hospital Stay
Admission: EM | Admit: 2016-01-10 | Discharge: 2016-01-15 | DRG: 444 | Disposition: A | Discharge status: Home / Self Care | Payer: Medicare Other | Attending: Surgery | Admitting: Surgery

## 2016-01-10 DIAGNOSIS — K219 Gastro-esophageal reflux disease without esophagitis: Secondary | ICD-10-CM | POA: Diagnosis not present

## 2016-01-10 DIAGNOSIS — K819 Cholecystitis, unspecified: Secondary | ICD-10-CM

## 2016-01-10 DIAGNOSIS — E876 Hypokalemia: Secondary | ICD-10-CM | POA: Diagnosis present

## 2016-01-10 DIAGNOSIS — R1013 Epigastric pain: Secondary | ICD-10-CM | POA: Diagnosis not present

## 2016-01-10 DIAGNOSIS — K8011 Calculus of gallbladder with chronic cholecystitis with obstruction: Principal | ICD-10-CM | POA: Diagnosis present

## 2016-01-10 DIAGNOSIS — K30 Functional dyspepsia: Secondary | ICD-10-CM | POA: Diagnosis not present

## 2016-01-10 DIAGNOSIS — E43 Unspecified severe protein-calorie malnutrition: Secondary | ICD-10-CM | POA: Diagnosis not present

## 2016-01-10 DIAGNOSIS — E669 Obesity, unspecified: Secondary | ICD-10-CM | POA: Diagnosis present

## 2016-01-10 DIAGNOSIS — I10 Essential (primary) hypertension: Secondary | ICD-10-CM | POA: Diagnosis present

## 2016-01-10 DIAGNOSIS — R1310 Dysphagia, unspecified: Secondary | ICD-10-CM | POA: Diagnosis present

## 2016-01-10 DIAGNOSIS — R109 Unspecified abdominal pain: Secondary | ICD-10-CM

## 2016-01-10 DIAGNOSIS — K8309 Other cholangitis: Secondary | ICD-10-CM

## 2016-01-10 DIAGNOSIS — K830 Cholangitis: Secondary | ICD-10-CM | POA: Diagnosis not present

## 2016-01-10 DIAGNOSIS — K810 Acute cholecystitis: Secondary | ICD-10-CM

## 2016-01-10 DIAGNOSIS — Z6836 Body mass index (BMI) 36.0-36.9, adult: Secondary | ICD-10-CM

## 2016-01-10 DIAGNOSIS — E119 Type 2 diabetes mellitus without complications: Secondary | ICD-10-CM | POA: Diagnosis present

## 2016-01-10 DIAGNOSIS — R634 Abnormal weight loss: Secondary | ICD-10-CM | POA: Diagnosis present

## 2016-01-10 DIAGNOSIS — Z7984 Long term (current) use of oral hypoglycemic drugs: Secondary | ICD-10-CM

## 2016-01-10 DIAGNOSIS — K8010 Calculus of gallbladder with chronic cholecystitis without obstruction: Secondary | ICD-10-CM | POA: Diagnosis not present

## 2016-01-10 DIAGNOSIS — R935 Abnormal findings on diagnostic imaging of other abdominal regions, including retroperitoneum: Secondary | ICD-10-CM | POA: Diagnosis not present

## 2016-01-10 DIAGNOSIS — R1011 Right upper quadrant pain: Secondary | ICD-10-CM | POA: Diagnosis not present

## 2016-01-10 DIAGNOSIS — K8020 Calculus of gallbladder without cholecystitis without obstruction: Secondary | ICD-10-CM | POA: Diagnosis not present

## 2016-01-10 DIAGNOSIS — R5381 Other malaise: Secondary | ICD-10-CM | POA: Diagnosis not present

## 2016-01-10 LAB — URINALYSIS COMPLETE WITH MICROSCOPIC (ARMC ONLY)
Bacteria, UA: NONE SEEN
Bilirubin Urine: NEGATIVE
Glucose, UA: NEGATIVE mg/dL
Hgb urine dipstick: NEGATIVE
Ketones, ur: NEGATIVE mg/dL
Leukocytes, UA: NEGATIVE
Nitrite: NEGATIVE
Protein, ur: 30 mg/dL — AB
Specific Gravity, Urine: 1.014 (ref 1.005–1.030)
pH: 5 (ref 5.0–8.0)

## 2016-01-10 LAB — COMPREHENSIVE METABOLIC PANEL
ALT: 108 U/L — ABNORMAL HIGH (ref 14–54)
AST: 81 U/L — ABNORMAL HIGH (ref 15–41)
Albumin: 3.7 g/dL (ref 3.5–5.0)
Alkaline Phosphatase: 242 U/L — ABNORMAL HIGH (ref 38–126)
Anion gap: 6 (ref 5–15)
BUN: 14 mg/dL (ref 6–20)
CO2: 26 mmol/L (ref 22–32)
Calcium: 8.6 mg/dL — ABNORMAL LOW (ref 8.9–10.3)
Chloride: 106 mmol/L (ref 101–111)
Creatinine, Ser: 1.07 mg/dL — ABNORMAL HIGH (ref 0.44–1.00)
GFR calc Af Amer: 54 mL/min — ABNORMAL LOW (ref >60)
GFR calc non Af Amer: 47 mL/min — ABNORMAL LOW (ref >60)
Glucose, Bld: 183 mg/dL — ABNORMAL HIGH (ref 65–99)
Potassium: 3.4 mmol/L — ABNORMAL LOW (ref 3.5–5.1)
Sodium: 138 mmol/L (ref 135–145)
Total Bilirubin: 3.5 mg/dL — ABNORMAL HIGH (ref 0.3–1.2)
Total Protein: 6.8 g/dL (ref 6.5–8.1)

## 2016-01-10 LAB — CBC
HCT: 32.6 % — ABNORMAL LOW (ref 35.0–47.0)
Hemoglobin: 11.2 g/dL — ABNORMAL LOW (ref 12.0–16.0)
MCH: 30 pg (ref 26.0–34.0)
MCHC: 34.4 g/dL (ref 32.0–36.0)
MCV: 87.1 fL (ref 80.0–100.0)
Platelets: 176 10*3/uL (ref 150–440)
RBC: 3.74 MIL/uL — ABNORMAL LOW (ref 3.80–5.20)
RDW: 17.1 % — ABNORMAL HIGH (ref 11.5–14.5)
WBC: 8.1 10*3/uL (ref 3.6–11.0)

## 2016-01-10 LAB — LIPASE, BLOOD: Lipase: 94 U/L — ABNORMAL HIGH (ref 11–51)

## 2016-01-10 MED ORDER — TRAMADOL HCL 50 MG PO TABS
50.0000 mg | ORAL_TABLET | Freq: Four times a day (QID) | ORAL | Status: DC | PRN
  Administered 2016-01-11: 50 mg via ORAL
  Filled 2016-01-10: qty 1

## 2016-01-10 MED ORDER — DIATRIZOATE MEGLUMINE & SODIUM 66-10 % PO SOLN
15.0000 mL | Freq: Once | ORAL | Status: AC
  Administered 2016-01-10: 15 mL via ORAL

## 2016-01-10 MED ORDER — IOPAMIDOL (ISOVUE-300) INJECTION 61%
75.0000 mL | Freq: Once | INTRAVENOUS | Status: AC | PRN
  Administered 2016-01-10: 75 mL via INTRAVENOUS
  Filled 2016-01-10: qty 75

## 2016-01-10 MED ORDER — CIPROFLOXACIN IN D5W 400 MG/200ML IV SOLN
400.0000 mg | Freq: Two times a day (BID) | INTRAVENOUS | Status: DC
  Administered 2016-01-11 – 2016-01-15 (×9): 400 mg via INTRAVENOUS
  Filled 2016-01-10 (×11): qty 200

## 2016-01-10 MED ORDER — LOSARTAN POTASSIUM-HCTZ 100-25 MG PO TABS: 1.0000 | ORAL_TABLET | Freq: Every day | ORAL | Status: DC

## 2016-01-10 MED ORDER — ACETAMINOPHEN 325 MG PO TABS: 650.0000 mg | ORAL_TABLET | Freq: Four times a day (QID) | ORAL | Status: DC | PRN

## 2016-01-10 MED ORDER — MORPHINE SULFATE (PF) 4 MG/ML IV SOLN
4.0000 mg | Freq: Once | INTRAVENOUS | Status: AC
  Administered 2016-01-10: 4 mg via INTRAVENOUS
  Filled 2016-01-10: qty 1

## 2016-01-10 MED ORDER — METRONIDAZOLE IN NACL 5-0.79 MG/ML-% IV SOLN
500.0000 mg | Freq: Three times a day (TID) | INTRAVENOUS | Status: DC
  Administered 2016-01-11 – 2016-01-15 (×13): 500 mg via INTRAVENOUS
  Filled 2016-01-10 (×17): qty 100

## 2016-01-10 MED ORDER — ACETAMINOPHEN 650 MG RE SUPP
650.0000 mg | Freq: Four times a day (QID) | RECTAL | Status: DC | PRN
  Filled 2016-01-10: qty 1

## 2016-01-10 MED ORDER — ONDANSETRON 4 MG PO TBDP: 4.0000 mg | ORAL_TABLET | Freq: Four times a day (QID) | ORAL | Status: DC | PRN

## 2016-01-10 MED ORDER — GLIMEPIRIDE 2 MG PO TABS
2.0000 mg | ORAL_TABLET | Freq: Every day | ORAL | Status: DC
Start: 2016-01-11 — End: 2016-01-11
  Filled 2016-01-10: qty 1

## 2016-01-10 MED ORDER — PANTOPRAZOLE SODIUM 40 MG PO TBEC
40.0000 mg | DELAYED_RELEASE_TABLET | Freq: Every day | ORAL | Status: DC
  Administered 2016-01-10 – 2016-01-15 (×6): 40 mg via ORAL
  Filled 2016-01-10 (×6): qty 1

## 2016-01-10 MED ORDER — SIMVASTATIN 20 MG PO TABS
20.0000 mg | ORAL_TABLET | Freq: Every day | ORAL | Status: DC
  Administered 2016-01-11 – 2016-01-14 (×4): 20 mg via ORAL
  Filled 2016-01-10 (×4): qty 1

## 2016-01-10 MED ORDER — METOPROLOL SUCCINATE ER 100 MG PO TB24
100.0000 mg | ORAL_TABLET | Freq: Every day | ORAL | Status: DC
  Administered 2016-01-10 – 2016-01-15 (×6): 100 mg via ORAL
  Filled 2016-01-10 (×7): qty 1

## 2016-01-10 MED ORDER — ONDANSETRON HCL 4 MG/2ML IJ SOLN
4.0000 mg | Freq: Four times a day (QID) | INTRAMUSCULAR | Status: DC | PRN
  Administered 2016-01-12: 4 mg via INTRAVENOUS
  Filled 2016-01-10: qty 2

## 2016-01-10 MED ORDER — HYDROCHLOROTHIAZIDE 25 MG PO TABS
25.0000 mg | ORAL_TABLET | Freq: Every day | ORAL | Status: DC
  Administered 2016-01-10 – 2016-01-15 (×5): 25 mg via ORAL
  Filled 2016-01-10 (×6): qty 1

## 2016-01-10 MED ORDER — ENOXAPARIN SODIUM 40 MG/0.4ML ~~LOC~~ SOLN
40.0000 mg | SUBCUTANEOUS | Status: DC
  Administered 2016-01-10 – 2016-01-14 (×5): 40 mg via SUBCUTANEOUS
  Filled 2016-01-10 (×6): qty 0.4

## 2016-01-10 MED ORDER — LOSARTAN POTASSIUM 50 MG PO TABS
100.0000 mg | ORAL_TABLET | Freq: Every day | ORAL | Status: DC
  Administered 2016-01-10 – 2016-01-15 (×6): 100 mg via ORAL
  Filled 2016-01-10 (×6): qty 2

## 2016-01-10 MED ORDER — SODIUM CHLORIDE 0.9 % IV BOLUS (SEPSIS)
500.0000 mL | Freq: Once | INTRAVENOUS | Status: AC
  Administered 2016-01-10: 500 mL via INTRAVENOUS

## 2016-01-10 MED ORDER — MORPHINE SULFATE (PF) 4 MG/ML IV SOLN: 4.0000 mg | INTRAVENOUS | Status: DC | PRN

## 2016-01-10 MED ORDER — POTASSIUM CHLORIDE IN NACL 40-0.9 MEQ/L-% IV SOLN
INTRAVENOUS | Status: DC
  Administered 2016-01-10 – 2016-01-14 (×6): 75 mL/h via INTRAVENOUS
  Filled 2016-01-10 (×9): qty 1000

## 2016-01-10 NOTE — ED Provider Notes (Addendum)
[facility] Emergency Department Provider Note  ____________________________________________   I have reviewed the triage vital signs and the nursing notes.   HISTORY  Chief Complaint Emesis    HPI Victoria Dalton is a 82 y.o. female who presents today complaining of epigastric abdominal discomfort, as well as nausea and "burping". Patient states that she has had these symptoms since Tuesday. She had a fever 100.7 and 1.. Denies flank pain or dysuria. Denies frank vomiting but does feel nauseated. Has had decreased by mouth the last 2 days. Denies any diarrhea. Had normal bowel movements until today. Denies any recent surgical history. Denies headache, denies chest pain or shortness of breath.She received nausea medication from her primary care doctor and was to have an outpatient ultrasound but still feels bad and would like to coming here. The pain as a tightness. It is a mild to moderate discomfort. Seems that he might be worse with food although difficult for her to say.    Past Medical History:  Diagnosis Date  . Diabetes mellitus without complication ([place])   . Hypertension   . Kidney stone   . UTI (lower urinary tract infection)     Patient Active Problem List   Diagnosis Date Noted  . Benign essential HTN 12/06/2015  . HLD (hyperlipidemia) 05/23/2015  . Gastro-esophageal reflux disease without esophagitis 05/08/2014  . Controlled type 2 diabetes mellitus without complication ([place]) [id number]    Past Surgical History:  Procedure Laterality Date  . KNEE ARTHROSCOPY Left   . TONSILLECTOMY      Current Outpatient Rx  . Order #: [id number] Class: Historical Med  . Order #: [id number] Class: Print  . Order #: [id number] Class: Historical Med  . Order #: [id number] Class: Historical Med  . Order #: [id number] Class: Historical Med  . Order #: [id number] Class: Print  . Order #: [id number] Class: Historical Med  . Order #: [id number] Class: Historical Med  .  Order #: [id number] Class: Historical Med  . Order #: [id number] Class: Normal  . Order #: [id number] Class: Historical Med  . Order #: [id number] Class: Historical Med  . Order #: [id number] Class: Historical Med  . Order #: [id number] Class: Historical Med  . Order #: [id number] Class: Historical Med    Allergies Ramipril; Penicillins; and Sulfa antibiotics  No family history on file.  Social History Social History  Substance Use Topics  . Smoking status: Never Smoker  . Smokeless tobacco: Never Used  . Alcohol use No    Review of Systems Constitutional: Positive fever/chills Eyes: No visual changes. ENT: No sore throat. No stiff neck no neck pain Cardiovascular: Denies chest pain. Respiratory: Denies shortness of breath. Gastrointestinal:  See history of present illness  No diarrhea.  No constipation. Genitourinary: Negative for dysuria. Musculoskeletal: Negative lower extremity swelling Skin: Negative for rash. Neurological: Negative for severe headaches, focal weakness or numbness. 10-point ROS otherwise negative.  ____________________________________________   PHYSICAL EXAM:  VITAL SIGNS: ED Triage Vitals  Enc Vitals Group     BP 01/10/16 1302 (!) 155/86     Pulse Rate 01/10/16 1302 63     Resp 01/10/16 1302 18     Temp 01/10/16 1302 98.4 F (36.9 C)     Temp Source 01/10/16 1302 Oral     SpO2 01/10/16 1302 95 %     Weight 01/10/16 1303 243 lb (110.2 kg)     Height 01/10/16 1303 5\' 8"  (1.727 m)     Head Circumference --      Peak Flow --      Pain  Score --      Pain Loc --      Pain Edu? --      Excl. in [place]? --     Constitutional: Alert and oriented. Well appearing and in no acute distress. Eyes: Conjunctivae are normal. PERRL. EOMI. Head: Atraumatic. Nose: No congestion/rhinnorhea. Mouth/Throat: Mucous membranes are moist.  Oropharynx non-erythematous. Neck: No stridor.   Nontender with no meningismus Cardiovascular: Normal rate, regular rhythm. Grossly  normal heart sounds.  Good peripheral circulation. Respiratory: Normal respiratory effort.  No retractions. Lungs CTAB. Abdominal: Soft Obese, there is tenderness palpation in epigastric right upper quadrant otherwise nontender. No distention. No guarding no rebound Back:  There is no focal tenderness or step off.  there is no midline tenderness there are no lesions noted. there is no CVA tenderness Musculoskeletal: No lower extremity tenderness, no upper extremity tenderness. No joint effusions, no DVT signs strong distal pulses no edema Neurologic:  Normal speech and language. No gross focal neurologic deficits are appreciated.  Skin:  Skin is warm, dry and intact. No rash noted. Psychiatric: Mood and affect are normal. Speech and behavior are normal.  ____________________________________________   LABS (all labs ordered are listed, but only abnormal results are displayed)  Labs Reviewed  LIPASE, BLOOD - Abnormal; Notable for the following:       Result Value   Lipase 94 (*)    All other components within normal limits  COMPREHENSIVE METABOLIC PANEL - Abnormal; Notable for the following:    Potassium 3.4 (*)    Glucose, Bld 183 (*)    Creatinine, Ser 1.07 (*)    Calcium 8.6 (*)    AST 81 (*)    ALT 108 (*)    Alkaline Phosphatase 242 (*)    Total Bilirubin 3.5 (*)    GFR calc non Af Amer 47 (*)    GFR calc Af Amer 54 (*)    All other components within normal limits  CBC - Abnormal; Notable for the following:    RBC 3.74 (*)    Hemoglobin 11.2 (*)    HCT 32.6 (*)    RDW 17.1 (*)    All other components within normal limits  URINALYSIS COMPLETEWITH MICROSCOPIC ([facility] ONLY)   ____________________________________________  EKG  I personally interpreted any EKGs ordered by me or triageSinus regarding rate 50 bpm no acute ST elevation or acute ST depression normal axis ____________________________________________  RADIOLOGY  I reviewed any imaging ordered by me or triage  that were performed during my shift and, if possible, patient and/or family made aware of any abnormal findings. ____________________________________________   PROCEDURES  Procedure(s) performed: None  Procedures  Critical Care performed: None  ____________________________________________   INITIAL IMPRESSION / ASSESSMENT AND PLAN / ED COURSE  Pertinent labs & imaging results that were available during my care of the patient were reviewed by me and considered in my medical decision making (see chart for details).  Patient with right upper quadrant epigastric discomfort and vomiting for several days. Nontoxic in appearance however liver function tests and lipase are elevated suggestive of probably a CBD stone. We will obtain ultrasound and reassessment no evidence of obstruction.  Clinical Course   _______ ----------------------------------------- 3:52 PM on 01/10/2016 -----------------------------------------  [place] noted. D/w Dr. Pat Patrick who will come see pt.  _____________________________________   FINAL CLINICAL IMPRESSION(S) / ED DIAGNOSES  Final diagnoses:  Abdominal pain      This chart was dictated using voice recognition software.  Despite best efforts to proofread,  errors can occur which can change meaning.      Schuyler Amor, MD 01/10/16 1445    Schuyler Amor, MD 01/10/16 [place], MD 01/10/16 [phone]

## 2016-01-10 NOTE — H&P (Signed)
Victoria Dalton is a 82 y.o. female  with a three-month history of abdominal pain.  HPI: She has a complicated presentation. She's been complaining of dysphagia upper abdominal pain for approximately 3-4 months. She was evaluated in May and June by her primary care physician and has been seen by the gastroenterologist. She was complaining of marked burping and indigestion. She was unable to eat much and lost approximately 25 pounds. She has CT scan performed in 2015 for kidney stones was noted to have gallstones at that time. A barium swallow with upper GI evaluation demonstrated no significant abnormalities. She was placed on a variety of PPI drugs without any real improvement in her symptoms. She did notice significant change in her urine during June with her urine became quite dark.  Over the last week to 10 days she's had increasing symptoms worsening in the last 24 hours. She presented to the emergency room for further evaluation. Ultrasound demonstrated multiple stones slightly elevated lipase and bilirubin of 3.5 elevated transaminase. There was no stitch change in bile duct size on ultrasound.  She denies any history of hepatitis yellow jaundice pancreatitis peptic ulcer disease previous diagnosis of gallbladder disease or diverticulitis. She has had diverticulosis. She has had a recent colonoscopy. She's had no previous abdominal surgery.  She has no cardiac disease or thyroid problems. She is hypertensive and diabetic currently on oral medications. Because of the findings demonstrated on ultrasound her clinical presentation and laboratory workup the surgical service was consulted to consider possible biliary tract disease possible acute cholecystitis.  Past Medical History:  Diagnosis Date  . Diabetes mellitus without complication ([place])   . Hypertension   . Kidney stone   . UTI (lower urinary tract infection)    Past Surgical History:  Procedure Laterality Date  . KNEE ARTHROSCOPY Left    . TONSILLECTOMY     Social History   Social History  . Marital status: Widowed    Spouse name: N/A  . Number of children: N/A  . Years of education: N/A   Social History Main Topics  . Smoking status: Never Smoker  . Smokeless tobacco: Never Used  . Alcohol use No  . Drug use: No  . Sexual activity: Not Asked   Other Topics Concern  . None   Social History Narrative  . None     Review of Systems  Constitutional: Positive for malaise/fatigue and weight loss. Negative for chills, diaphoresis and fever.  HENT: Negative.   Eyes: Negative.   Respiratory: Negative for cough, shortness of breath and wheezing.   Cardiovascular: Negative for chest pain and palpitations.  Gastrointestinal: Positive for abdominal pain, heartburn and nausea. Negative for constipation, diarrhea and vomiting.  Genitourinary: Negative for dysuria, frequency and urgency.  Musculoskeletal: Negative.   Skin: Positive for itching and rash.  Neurological: Negative.   Psychiatric/Behavioral: Negative.      PHYSICAL EXAM: BP (!) 155/86 (BP Location: Left Arm)   Pulse 63   Temp 98.4 F (36.9 C) (Oral)   Resp 18   Ht 5\' 8"  (1.727 m)   Wt 110.2 kg (243 lb)   SpO2 95%   BMI 36.95 kg/m   Physical Exam  Constitutional: She is oriented to person, place, and time. She appears well-developed and well-nourished. No distress.  HENT:  Head: Normocephalic and atraumatic.  Her sclerae do not appear icteric  Eyes: EOM are normal. Pupils are equal, round, and reactive to light. No scleral icterus.  Neck: Normal range of motion. Neck  supple.  Cardiovascular: Normal rate, regular rhythm and normal heart sounds.   Pulmonary/Chest: Effort normal and breath sounds normal. No respiratory distress.  Abdominal: Soft. Bowel sounds are normal. She exhibits no distension. There is tenderness. There is no rebound and no guarding. No hernia.  Musculoskeletal: Normal range of motion. She exhibits no edema or deformity.   Neurological: She is alert and oriented to person, place, and time.  Skin: Skin is warm and dry. She is not diaphoretic.  Mildly jaundiced  Psychiatric: She has a normal mood and affect. Her behavior is normal.   Her abdomen is generally soft with minimal abdominal distention. She has some mild midepigastric right upper quadrant sub costal tenderness. There is no guarding. She has no rebound. She does have active bowel sounds. I cannot palpate her gallbladder.    Impression/Plan: With her elevated bilirubin slightly elevated lipase pericholecystic fluid multiple stones working diagnosis here would be cholelithiasis with cholecystitis. Her common duct to the upper limits of normal at 6.8 mm. She is mildly jaundiced clinically but I do not see any evidence of jaundice in her sclerae. With his ongoing problem I suspect she's had subacute biliary tract disease for several months. We'll get a CT scan tonight to rule out any other significant abdominal pathology that might account for her weight loss and if nothing else is identified we will consider surgical intervention. This plans been discussed with the patient and she is in agreement. We'll keep her nothing by mouth while we complete this workup. We will also get an internal medicine consult to assist with her hypertension and diabetes.   Dia Crawford III, MD  01/10/2016, 4:45 PM

## 2016-01-10 NOTE — ED Notes (Signed)
Pt unable to urinate at this moment,given specimen cup for when is able to void. Pt sent back to lobby.

## 2016-01-10 NOTE — ED Notes (Signed)
Patient transported to Ultrasound 

## 2016-01-10 NOTE — ED Triage Notes (Signed)
Pt c/o belching with N/V.Marland Kitchen Denies diarrhea or abd pain.. States she has an apt for ultrasound on Tuesday but is feeling worse.Marland Kitchen

## 2016-01-10 NOTE — ED Notes (Signed)
Attempted to call report. Floor nurse in pt room.

## 2016-01-11 ENCOUNTER — Observation Stay: Payer: Medicare Other

## 2016-01-11 ENCOUNTER — Encounter: Payer: Self-pay | Admitting: Internal Medicine

## 2016-01-11 ENCOUNTER — Encounter
Admission: EM | Disposition: A | Discharge status: Home / Self Care | Payer: Self-pay | Source: Home / Self Care | Attending: Surgery

## 2016-01-11 DIAGNOSIS — K8020 Calculus of gallbladder without cholecystitis without obstruction: Secondary | ICD-10-CM | POA: Diagnosis not present

## 2016-01-11 DIAGNOSIS — R935 Abnormal findings on diagnostic imaging of other abdominal regions, including retroperitoneum: Secondary | ICD-10-CM | POA: Diagnosis not present

## 2016-01-11 DIAGNOSIS — K8010 Calculus of gallbladder with chronic cholecystitis without obstruction: Secondary | ICD-10-CM | POA: Diagnosis not present

## 2016-01-11 DIAGNOSIS — E43 Unspecified severe protein-calorie malnutrition: Secondary | ICD-10-CM | POA: Insufficient documentation

## 2016-01-11 LAB — GLUCOSE, CAPILLARY
Glucose-Capillary: 98 mg/dL (ref 65–99)
Glucose-Capillary: 117 mg/dL — ABNORMAL HIGH (ref 65–99)
Glucose-Capillary: 137 mg/dL — ABNORMAL HIGH (ref 65–99)

## 2016-01-11 LAB — CBC
HCT: 32 % — ABNORMAL LOW (ref 35.0–47.0)
Hemoglobin: 10.9 g/dL — ABNORMAL LOW (ref 12.0–16.0)
MCH: 29.9 pg (ref 26.0–34.0)
MCHC: 33.9 g/dL (ref 32.0–36.0)
MCV: 88.2 fL (ref 80.0–100.0)
Platelets: 163 10*3/uL (ref 150–440)
RBC: 3.62 MIL/uL — ABNORMAL LOW (ref 3.80–5.20)
RDW: 17 % — ABNORMAL HIGH (ref 11.5–14.5)
WBC: 6.9 10*3/uL (ref 3.6–11.0)

## 2016-01-11 LAB — COMPREHENSIVE METABOLIC PANEL
ALT: 90 U/L — ABNORMAL HIGH (ref 14–54)
AST: 68 U/L — ABNORMAL HIGH (ref 15–41)
Albumin: 3.6 g/dL (ref 3.5–5.0)
Alkaline Phosphatase: 216 U/L — ABNORMAL HIGH (ref 38–126)
Anion gap: 6 (ref 5–15)
BUN: 11 mg/dL (ref 6–20)
CO2: 28 mmol/L (ref 22–32)
Calcium: 8.7 mg/dL — ABNORMAL LOW (ref 8.9–10.3)
Chloride: 108 mmol/L (ref 101–111)
Creatinine, Ser: 1.02 mg/dL — ABNORMAL HIGH (ref 0.44–1.00)
GFR calc Af Amer: 58 mL/min — ABNORMAL LOW (ref >60)
GFR calc non Af Amer: 50 mL/min — ABNORMAL LOW (ref >60)
Glucose, Bld: 80 mg/dL (ref 65–99)
Potassium: 3.3 mmol/L — ABNORMAL LOW (ref 3.5–5.1)
Sodium: 142 mmol/L (ref 135–145)
Total Bilirubin: 2.6 mg/dL — ABNORMAL HIGH (ref 0.3–1.2)
Total Protein: 6.4 g/dL — ABNORMAL LOW (ref 6.5–8.1)

## 2016-01-11 LAB — PROTIME-INR
INR: 1.08
Prothrombin Time: 14 seconds (ref 11.4–15.2)

## 2016-01-11 SURGERY — LAPAROSCOPIC CHOLECYSTECTOMY WITH INTRAOPERATIVE CHOLANGIOGRAM
Anesthesia: Choice

## 2016-01-11 MED ORDER — HYDRALAZINE HCL 20 MG/ML IJ SOLN
5.0000 mg | Freq: Four times a day (QID) | INTRAMUSCULAR | Status: DC | PRN
  Administered 2016-01-11: 5 mg via INTRAVENOUS
  Filled 2016-01-11: qty 1

## 2016-01-11 MED ORDER — INSULIN ASPART 100 UNIT/ML ~~LOC~~ SOLN
0.0000 [IU] | Freq: Three times a day (TID) | SUBCUTANEOUS | Status: DC
  Administered 2016-01-12 (×2): 1 [IU] via SUBCUTANEOUS
  Administered 2016-01-13: 3 [IU] via SUBCUTANEOUS
  Administered 2016-01-13: 1 [IU] via SUBCUTANEOUS
  Administered 2016-01-14: 2 [IU] via SUBCUTANEOUS
  Administered 2016-01-14: 1 [IU] via SUBCUTANEOUS
  Administered 2016-01-14 – 2016-01-15 (×2): 2 [IU] via SUBCUTANEOUS
  Administered 2016-01-15: 3 [IU] via SUBCUTANEOUS
  Filled 2016-01-11: qty 3
  Filled 2016-01-11: qty 1
  Filled 2016-01-11: qty 3
  Filled 2016-01-11 (×2): qty 2
  Filled 2016-01-11 (×2): qty 1
  Filled 2016-01-11: qty 2
  Filled 2016-01-11: qty 1

## 2016-01-11 MED ORDER — GADOBENATE DIMEGLUMINE 529 MG/ML IV SOLN
20.0000 mL | Freq: Once | INTRAVENOUS | Status: AC | PRN
  Administered 2016-01-11: 20 mL via INTRAVENOUS

## 2016-01-11 MED ORDER — INSULIN ASPART 100 UNIT/ML ~~LOC~~ SOLN
0.0000 [IU] | Freq: Every day | SUBCUTANEOUS | Status: DC
  Administered 2016-01-12: 2 [IU] via SUBCUTANEOUS
  Filled 2016-01-11: qty 2

## 2016-01-11 NOTE — Consult Note (Signed)
[facility] NAME: Victoria Dalton    MR#:  [id number]  DATE OF BIRTH:  July 29, 1930  DATE OF ADMISSION:  01/10/2016  PRIMARY CARE PHYSICIAN: Juluis Pitch, MD   REQUESTING/REFERRING PHYSICIAN: Dia Crawford III, MD  CHIEF COMPLAINT:   Chief Complaint  Patient presents with  . Emesis   Medical management. HISTORY OF PRESENT ILLNESS:  Victoria Dalton  is a 82 y.o. female with a known history of Hypertension, diabetes kidney stone. The patient was admitted for gallbladder stones and biliary dilatation. Dr. Pat Patrick requested medical clearance for surgery and medical management. The patient has had dysphagia and upper abdominal fullness for 3-4 months. She denies any nausea vomiting or diarrhea.  PAST MEDICAL HISTORY:   Past Medical History:  Diagnosis Date  . Diabetes mellitus without complication ([place])   . Hypertension   . Kidney stone   . UTI (lower urinary tract infection)     PAST SURGICAL HISTOIRY:   Past Surgical History:  Procedure Laterality Date  . KNEE ARTHROSCOPY Left   . TONSILLECTOMY      SOCIAL HISTORY:   Social History  Substance Use Topics  . Smoking status: Never Smoker  . Smokeless tobacco: Never Used  . Alcohol use No    FAMILY HISTORY:   Family History  Problem Relation Age of Onset  . Cancer Father   . Heart attack Father     DRUG ALLERGIES:   Allergies  Allergen Reactions  . Ramipril Other (See Comments)    IRREGULAR HEART BEAT  . Penicillins Rash    Has patient had a PCN reaction causing immediate rash, facial/tongue/throat swelling, SOB or lightheadedness with hypotension: Yes Has patient had a PCN reaction causing severe rash involving mucus membranes or skin necrosis: Yes Has patient had a PCN reaction that required hospitalization No Has patient had a PCN reaction occurring within the last 10 years: No If all of the above answers are "NO", then may proceed with Cephalosporin  use.   . Sulfa Antibiotics Rash    REVIEW OF SYSTEMS:  CONSTITUTIONAL: No fever, fatigue or weakness.  EYES: No blurred or double vision.  EARS, NOSE, AND THROAT: No tinnitus or ear pain.  RESPIRATORY: No cough, shortness of breath, wheezing or hemoptysis.  CARDIOVASCULAR: No chest pain, orthopnea, edema.  GASTROINTESTINAL: No nausea, vomiting, diarrhea or abdominal pain.  GENITOURINARY: No dysuria, hematuria.  ENDOCRINE: No polyuria, nocturia,  HEMATOLOGY: No anemia, easy bruising or bleeding SKIN: No rash or lesion. MUSCULOSKELETAL: No joint pain or arthritis.   NEUROLOGIC: No tingling, numbness, weakness.  PSYCHIATRY: No anxiety or depression.   MEDICATIONS AT HOME:   Prior to Admission medications   Medication Sig Start Date End Date Taking? Authorizing Provider  aspirin 81 MG chewable tablet Chew 81 mg by mouth daily.   Yes Historical Provider, MD  glimepiride (AMARYL) 2 MG tablet Take 1 mg by mouth every morning.  12/22/14  Yes Historical Provider, MD  lidocaine (XYLOCAINE) 2 % solution Use as directed 20 mLs in the mouth or throat as needed (For throat pain). 11/16/15  Yes Earleen Newport, MD  losartan-hydrochlorothiazide [facility]) 100-25 MG tablet Take by mouth. 04/24/15  Yes Historical Provider, MD  metoprolol succinate (TOPROL-XL) 100 MG 24 hr tablet Take by mouth. 11/06/15  Yes Historical Provider, MD  Multiple Vitamins-Minerals (PRESERVISION AREDS PO) Take by mouth.   Yes Historical Provider, MD  pantoprazole (PROTONIX) 40 MG tablet Take 1 tablet (40 mg total)  by mouth daily. 12/06/15  Yes Lucilla Lame, MD  promethazine (PHENERGAN) 25 MG tablet take 1/2 tablet by mouth every 8 hours if needed for nausea or vomiting 09/29/15  Yes Historical Provider, MD  simvastatin (ZOCOR) 40 MG tablet Take 40 mg by mouth at bedtime. 10/12/15  Yes Historical Provider, MD  sitaGLIPtin (JANUVIA) 50 MG tablet Take 50 mg by mouth daily.   Yes Historical Provider, MD  metoCLOPramide (REGLAN) 10 MG  tablet Take 1 tablet (10 mg total) by mouth every 8 (eight) hours as needed for nausea or vomiting. Patient not taking: Reported on 01/10/2016 11/16/15   Earleen Newport, MD  ONE TOUCH ULTRA TEST test strip  11/02/15   Historical Provider, MD      VITAL SIGNS:  Blood pressure (!) 166/60, pulse (!) 53, temperature 98.1 F (36.7 C), temperature source Oral, resp. rate 17, height 5\' 8"  (1.727 m), weight 243 lb (110.2 kg), SpO2 95 %.  PHYSICAL EXAMINATION:  GENERAL:  82 y.o.-year-old patient lying in the bed with no acute distress. Obese. EYES: Pupils equal, round, reactive to light and accommodation. No scleral icterus. Extraocular muscles intact.  HEENT: Head atraumatic, normocephalic. Oropharynx and nasopharynx clear.  NECK:  Supple, no jugular venous distention. No thyroid enlargement, no tenderness.  LUNGS: Normal breath sounds bilaterally, no wheezing, rales,rhonchi or crepitation. No use of accessory muscles of respiration.  CARDIOVASCULAR: S1, S2 normal. No murmurs, rubs, or gallops.  ABDOMEN: Soft, nontender, nondistended. Bowel sounds present. No organomegaly or mass.  EXTREMITIES: No pedal edema, cyanosis, or clubbing.  NEUROLOGIC: Cranial nerves II through XII are intact. Muscle strength 5/5 in all extremities. Sensation intact. Gait not checked.  PSYCHIATRIC: The patient is alert and oriented x 3.  SKIN: No obvious rash, lesion, or ulcer.  LABORATORY PANEL:   CBC  Recent Labs Lab 01/11/16 0413  WBC 6.9  HGB 10.9*  HCT 32.0*  PLT 163   ------------------------------------------------------------------------------------------------------------------  Chemistries   Recent Labs Lab 01/11/16 0413  NA 142  K 3.3*  CL 108  CO2 28  GLUCOSE 80  BUN 11  CREATININE 1.02*  CALCIUM 8.7*  AST 68*  ALT 90*  ALKPHOS 216*  BILITOT 2.6*   ------------------------------------------------------------------------------------------------------------------  Cardiac  Enzymes No results for input(s): TROPONINI in the last 168 hours. ------------------------------------------------------------------------------------------------------------------  RADIOLOGY:    EKG:   Orders placed or performed during the hospital encounter of 01/10/16  . ED EKG  . ED EKG    IMPRESSION AND PLAN:    Cholelithiasis. Follow-up with Dr.Ely for possible surgery. The patient has low to moderate risk for surgery.  Hypokalemia. Give potassium supplement, follow-up BMP and magnesium level.  Abnormal liver function test. Possible due to Cholelithiasis.  Hypertension, controlled Continue hypertension medication.  Diabetes. Controlled, hold glimepiride and start sliding scale.   All the records are reviewed and case discussed with Consulting provider. Management plans discussed with the patient, Her daughter and they are in agreement.  CODE STATUS: Full code  TOTAL TIME TAKING CARE OF THIS PATIENT: 50 minutes.    Demetrios Loll M.D on 01/11/2016 at 5:48 PM  Between 7am to 6pm - Pager - [phone]  After 6pm go to [email or web address] - Proofreader  [facility]  Office  [phone]  CC: Primary care Physician: Juluis Pitch, MD   Note: This dictation was prepared with Dragon dictation along with smaller phrase technology. Any transcriptional errors that result from this process are unintentional.

## 2016-01-11 NOTE — Progress Notes (Signed)
Subjective:   She is feeling better this morning with minimal abdominal discomfort and no nausea. She still feels some dysphagia. Her bilirubin is down today to 2.5. CT scan last night demonstrated some changes in her bile duct which could be consistent with biliary obstruction on a bile duct level. Her gallbladder was small but still demonstrated signs of acute cholecystitis.  Vital signs in last 24 hours: Temp:  [97.5 F (36.4 C)-98.6 F (37 C)] 98.4 F (36.9 C) (08/18 0513) Pulse Rate:  [51-63] 57 (08/18 0700) Resp:  [18-20] 19 (08/18 0513) BP: (154-193)/(55-86) 193/55 (08/18 0700) SpO2:  [92 %-95 %] 92 % (08/18 0700) Weight:  [110.2 kg (243 lb)] 110.2 kg (243 lb) (08/17 1303) Last BM Date: 01/10/16  Intake/Output from previous day: 08/17 0701 - 08/18 0700 In: 348 [I.V.:248; IV Piggyback:100] Out: 2600 [Urine:2600]  Exam:  Her abdomen is soft nontender with minimal abdominal distention. She has some very mild right upper quadrant tenderness. She has active bowel sounds. She's breathing easily with no respiratory distress with no wheezing and no rales.  Lab Results:  CBC  Recent Labs  01/10/16 1305 01/11/16 0413  WBC 8.1 6.9  HGB 11.2* 10.9*  HCT 32.6* 32.0*  PLT 176 163   CMP     Component Value Date/Time   NA 142 01/11/2016 0413   NA 132 (L) 08/30/2013 0053   K 3.3 (L) 01/11/2016 0413   K 3.3 (L) 08/30/2013 0053   CL 108 01/11/2016 0413   CL 96 (L) 08/30/2013 0053   CO2 28 01/11/2016 0413   CO2 32 08/30/2013 0053   GLUCOSE 80 01/11/2016 0413   GLUCOSE 107 (H) 08/30/2013 0053   BUN 11 01/11/2016 0413   BUN 18 08/30/2013 0053   CREATININE 1.02 (H) 01/11/2016 0413   CREATININE 1.14 08/30/2013 0053   CALCIUM 8.7 (L) 01/11/2016 0413   CALCIUM 8.9 08/30/2013 0053   PROT 6.4 (L) 01/11/2016 0413   ALBUMIN 3.6 01/11/2016 0413   AST 68 (H) 01/11/2016 0413   ALT 90 (H) 01/11/2016 0413   ALKPHOS 216 (H) 01/11/2016 0413   BILITOT 2.6 (H) 01/11/2016 0413   GFRNONAA  50 (L) 01/11/2016 0413   GFRNONAA 45 (L) 08/30/2013 0053   GFRAA 58 (L) 01/11/2016 0413   GFRAA 53 (L) 08/30/2013 0053   PT/INR  Recent Labs  01/11/16 0413  LABPROT 14.0  INR 1.08    Studies/Results:   Assessment/Plan: I am a bit concerned about her biliary tree with the 25 pound weight loss to 4 month history of symptoms the small truck gallbladder and her biliary dilatation. We're obtaining an MRCP today to help retract our intervention. I discussed this plan with the patient and her daughter. They're in agreement.

## 2016-01-11 NOTE — Progress Notes (Signed)
Called Dr. Adonis Huguenin regarding patient's high blood pressure.  Appropriate orders were placed.  Christene Slates  01/11/2016 5:28 AM

## 2016-01-11 NOTE — Progress Notes (Signed)
Initial Nutrition Assessment  DOCUMENTATION CODES:   Severe malnutrition in context of acute illness/injury  INTERVENTION:  -Monitor diet progression and poc. -Once pt able to take po diet recommend boost breeze TID (appropriate on clear liquids) or Ensure Enlive po BID, each supplement provides 350 kcal and 20 grams of protein    NUTRITION DIAGNOSIS:   Malnutrition related to poor appetite, altered GI function as evidenced by percent weight loss, energy intake < or equal to 50% for > or equal to 5 days.    GOAL:   Patient will meet greater than or equal to 90% of their needs    MONITOR:   Diet advancement, Weight trends  REASON FOR ASSESSMENT:   Malnutrition Screening Tool    ASSESSMENT:      Pt admitted with abdominal pain for the past 3-4 months, burping, indigestion.  Has been seen by GI as an outpatient. Pt with cholelithasis with cholecystitis.    Past Medical History:  Diagnosis Date  . Diabetes mellitus without complication ([place])   . Hypertension   . Kidney stone   . UTI (lower urinary tract infection)    Pt reports since May decreased intake due to abdominal pain, bloating, unable to hold much food.  Reports has been eating bites of meals, jello, applesause. Has been drinking some boost just prior to admission but burped it up.  Medications reviewed: glimepiride, protonix, Ns with KCL at 20ml/hr Labs reviewed: K 3.3, creatinine 1.02, glucose 216  Nutrition-Focused physical exam completed. Findings are WDL for fat depletion, muscle depletion, and edema.    Diet Order:  Diet NPO time specified Except for: Ice Chips  Skin:  Reviewed, no issues  Last BM:  8/17  Height:   Ht Readings from Last 1 Encounters:  01/10/16 5\' 8"  (1.727 m)    Weight: 25 pound wt loss in the last 3 months (9% wt loss in the last 3 months)  Wt Readings from Last 1 Encounters:  01/10/16 243 lb (110.2 kg)    Ideal Body Weight:     BMI:  Body mass index is 36.95  kg/m.  Estimated Nutritional Needs:   Kcal:  1600-2000 kcals/d  Protein:  80-100 g/d  Fluid:  1600-2064ml/d  EDUCATION NEEDS:   No education needs identified at this time  Boris Engelmann B. Zenia Resides, [place], [place] (pager) Weekend/On-Call pager [phone])

## 2016-01-12 DIAGNOSIS — K819 Cholecystitis, unspecified: Secondary | ICD-10-CM | POA: Diagnosis not present

## 2016-01-12 DIAGNOSIS — R1011 Right upper quadrant pain: Secondary | ICD-10-CM

## 2016-01-12 DIAGNOSIS — K8010 Calculus of gallbladder with chronic cholecystitis without obstruction: Secondary | ICD-10-CM | POA: Diagnosis not present

## 2016-01-12 DIAGNOSIS — K830 Cholangitis: Secondary | ICD-10-CM | POA: Diagnosis not present

## 2016-01-12 LAB — GLUCOSE, CAPILLARY
Glucose-Capillary: 134 mg/dL — ABNORMAL HIGH (ref 65–99)
Glucose-Capillary: 126 mg/dL — ABNORMAL HIGH (ref 65–99)
Glucose-Capillary: 106 mg/dL — ABNORMAL HIGH (ref 65–99)
Glucose-Capillary: 120 mg/dL — ABNORMAL HIGH (ref 65–99)
Glucose-Capillary: 202 mg/dL — ABNORMAL HIGH (ref 65–99)

## 2016-01-12 LAB — HEMOGLOBIN A1C: Hgb A1c MFr Bld: 6 % (ref 4.0–6.0)

## 2016-01-12 MED ORDER — HYDRALAZINE HCL 20 MG/ML IJ SOLN
10.0000 mg | Freq: Four times a day (QID) | INTRAMUSCULAR | Status: DC | PRN
  Administered 2016-01-12: 10 mg via INTRAVENOUS
  Filled 2016-01-12: qty 1

## 2016-01-12 MED ORDER — POTASSIUM CHLORIDE 10 MEQ/100ML IV SOLN
10.0000 meq | INTRAVENOUS | Status: AC
  Administered 2016-01-12 (×3): 10 meq via INTRAVENOUS
  Filled 2016-01-12 (×3): qty 100

## 2016-01-12 NOTE — Consult Note (Signed)
Victoria Lame, MD Victoria Stratford., [place], Victoria Dalton Phone: [phone] Fax : [phone]  Consultation  Referring Provider:     No ref. provider found Primary Care Physician:  Victoria Pitch, MD Primary Gastroenterologist:  Dr. Allen Norris         Reason for Consultation:     Abnormal liver enzymes  Date of Admission:  01/10/2016 Date of Consultation:  01/12/2016         HPI:   Victoria Dalton is a 82 y.o. female who has been seeing me in the past for fullness in her throat. When she had seen me in the office she had reported 20 pound weight loss because she was afraid to eat.  The patient on the day of the office visit states that the symptoms had been getting better over the week prior to seeing me in the office and states that she was eating again.  The patient was switched to [place] as a trial and then given Protonix because it was generic.  The patient states that she had been doing well until recently when she started to have darkening of her urine.  The patient had an ultrasound that showed multiple stones with a slightly elevated lipase and a bilirubin of 3.5 on admission.  The patient also reports a lot of dyspepsia with constant belching.  The patient's CT scan showed a contracted gallbladder with a thickened wall.  The common bile duct Was 6.8 but her Bilirubin was elevated at 2.6 with increased AST and MALT at 68 and 90 respectively..  The patient alk phosphatase is also increased that to 116. The patient denies any history of hepatitis or alcohol abuse.  Past Medical History:  Diagnosis Date  . Diabetes mellitus without complication ([place])   . Hypertension   . Kidney stone   . UTI (lower urinary tract infection)     Past Surgical History:  Procedure Laterality Date  . KNEE ARTHROSCOPY Left   . TONSILLECTOMY      Prior to Admission medications   Medication Sig Start Date End Date Taking? Authorizing Provider  aspirin 81 MG chewable tablet Chew 81 mg by  mouth daily.   Yes Historical Provider, MD  glimepiride (AMARYL) 2 MG tablet Take 1 mg by mouth every morning.  12/22/14  Yes Historical Provider, MD  lidocaine (XYLOCAINE) 2 % solution Use as directed 20 mLs in the mouth or throat as needed (For throat pain). 11/16/15  Yes Earleen Newport, MD  losartan-hydrochlorothiazide [facility]) 100-25 MG tablet Take by mouth. 04/24/15  Yes Historical Provider, MD  metoprolol succinate (TOPROL-XL) 100 MG 24 hr tablet Take by mouth. 11/06/15  Yes Historical Provider, MD  Multiple Vitamins-Minerals (PRESERVISION AREDS PO) Take by mouth.   Yes Historical Provider, MD  pantoprazole (PROTONIX) 40 MG tablet Take 1 tablet (40 mg total) by mouth daily. 12/06/15  Yes Victoria Lame, MD  promethazine (PHENERGAN) 25 MG tablet take 1/2 tablet by mouth every 8 hours if needed for nausea or vomiting 09/29/15  Yes Historical Provider, MD  simvastatin (ZOCOR) 40 MG tablet Take 40 mg by mouth at bedtime. 10/12/15  Yes Historical Provider, MD  sitaGLIPtin (JANUVIA) 50 MG tablet Take 50 mg by mouth daily.   Yes Historical Provider, MD  metoCLOPramide (REGLAN) 10 MG tablet Take 1 tablet (10 mg total) by mouth every 8 (eight) hours as needed for nausea or vomiting. Patient not taking: Reported on 01/10/2016 11/16/15   Earleen Newport, MD  ONE  TOUCH ULTRA TEST test strip  11/02/15   Historical Provider, MD    Family History  Problem Relation Age of Onset  . Cancer Father   . Heart attack Father      Social History  Substance Use Topics  . Smoking status: Never Smoker  . Smokeless tobacco: Never Used  . Alcohol use No    Allergies as of 01/10/2016 - Review Complete 01/10/2016  Allergen Reaction Noted  . Ramipril Other (See Comments) 12/06/2015  . Penicillins Rash 10/14/2015  . Sulfa antibiotics Rash 10/14/2015    Review of Systems:    All systems reviewed and negative except where noted in HPI.   Physical Exam:  Vital signs in last 24 hours: Temp:  [97.6 F (36.4  C)-98.4 F (36.9 C)] 97.7 F (36.5 C) (08/19 1329) Pulse Rate:  [59-66] 66 (08/19 1329) Resp:  [16-20] 16 (08/19 1329) BP: (146-178)/(64-85) 146/78 (08/19 1329) SpO2:  [92 %-95 %] 95 % (08/19 1329) Last BM Date: 01/11/16 General:   Pleasant, cooperative in NAD Head:  Normocephalic and atraumatic. Eyes:   Positive scleral icterus.   Conjunctiva Jaundice. PERRLA. Ears:  Normal auditory acuity. Neck:  Supple; no masses or thyroidomegaly Lungs: Respirations even and unlabored. Lungs clear to auscultation bilaterally.   No wheezes, crackles, or rhonchi.  Heart:  Regular rate and rhythm;  Without murmur, clicks, rubs or gallops Abdomen:  Soft, nondistended, nontender. Normal bowel sounds. No appreciable masses or hepatomegaly.  No rebound or guarding.  Rectal:  Not performed. Msk:  Symmetrical without gross deformities.    Extremities:  Without edema, cyanosis or clubbing. Neurologic:  Alert and oriented x3;  grossly normal neurologically. Skin:  Intact without significant lesions or rashes. Cervical Nodes:  No significant cervical adenopathy. Psych:  Alert and cooperative. Normal affect.  LAB RESULTS:  Recent Labs  01/10/16 1305 01/11/16 0413  WBC 8.1 6.9  HGB 11.2* 10.9*  HCT 32.6* 32.0*  PLT 176 163   BMET  Recent Labs  01/10/16 1305 01/11/16 0413  NA 138 142  K 3.4* 3.3*  CL 106 108  CO2 26 28  GLUCOSE 183* 80  BUN 14 11  CREATININE 1.07* 1.02*  CALCIUM 8.6* 8.7*   LFT  Recent Labs  01/11/16 0413  PROT 6.4*  ALBUMIN 3.6  AST 68*  ALT 90*  ALKPHOS 216*  BILITOT 2.6*   PT/INR  Recent Labs  01/11/16 0413  LABPROT 14.0  INR 1.08    STUDIES:    Impression / Plan:   Victoria Dalton is a 82 y.o. y/o female with Who is seen me in the past for a globus sensation.  The patient had reported when she saw me that she was getting better and I had switched her medication with the instructions to contact me if she was not getting better.  At that time she  was recommended if she was not getting better to see ENT and possibly go through an esophageal manometry.  Now the patient gets admitted with symptoms of gallbladder disease.  The patient has an elevated bilirubin that has slightly gone down since admission.  The patient may need an ERCP in the future but I would recommend a laparoscopic cholecystectomy with IOC at the present time.  Shows the common bile duct to be normal then the patient may avoid having an ERCP.  If the [place] shows an abnormality than a ERCP can be done subsequent to the cholecystectomy.  The patient has been explained the plan and  agrees with it.   Thank you for involving me in the care of this patient.      LOS: 0 days   Victoria Lame, MD  01/12/2016, 2:43 PM   Note: This dictation was prepared with Dragon dictation along with smaller phrase technology. Any transcriptional errors that result from this process are unintentional.

## 2016-01-12 NOTE — Progress Notes (Signed)
[facility] - [place] at [facility]   PATIENT NAME: Nikisha Messmer    MR#:  [id number]  DATE OF BIRTH:  12/18/1930  SUBJECTIVE:   Pt admitted with ongoing symptoms of nause,burping, indigestion and weight for 2 months Feels better today Found to have gallstones REVIEW OF SYSTEMS:   Review of Systems  Constitutional: Positive for weight loss. Negative for chills and fever.  HENT: Negative for ear discharge, ear pain and nosebleeds.   Eyes: Negative for blurred vision, pain and discharge.  Respiratory: Negative for sputum production, shortness of breath, wheezing and stridor.   Cardiovascular: Negative for chest pain, palpitations, orthopnea and PND.  Gastrointestinal: Positive for nausea. Negative for abdominal pain, diarrhea and vomiting.  Genitourinary: Negative for frequency and urgency.  Musculoskeletal: Negative for back pain and joint pain.  Neurological: Positive for weakness. Negative for sensory change, speech change and focal weakness.  Psychiatric/Behavioral: Negative for depression and hallucinations. The patient is not nervous/anxious.    Tolerating Diet:yes Tolerating PT: pending  DRUG ALLERGIES:   Allergies  Allergen Reactions  . Ramipril Other (See Comments)    IRREGULAR HEART BEAT  . Penicillins Rash    Has patient had a PCN reaction causing immediate rash, facial/tongue/throat swelling, SOB or lightheadedness with hypotension: Yes Has patient had a PCN reaction causing severe rash involving mucus membranes or skin necrosis: Yes Has patient had a PCN reaction that required hospitalization No Has patient had a PCN reaction occurring within the last 10 years: No If all of the above answers are "NO", then may proceed with Cephalosporin use.   . Sulfa Antibiotics Rash    VITALS:  Blood pressure (!) 146/78, pulse 66, temperature 97.7 F (36.5 C), temperature source Oral, resp. rate 16, height 5\' 8"  (1.727 m), weight 243 lb (110.2 kg),  SpO2 95 %.  PHYSICAL EXAMINATION:   Physical Exam  GENERAL:  82 y.o.-year-old patient lying in the bed with no acute distress. obese EYES: Pupils equal, round, reactive to light and accommodation. No scleral icterus. Extraocular muscles intact.  HEENT: Head atraumatic, normocephalic. Oropharynx and nasopharynx clear.  NECK:  Supple, no jugular venous distention. No thyroid enlargement, no tenderness.  LUNGS: Normal breath sounds bilaterally, no wheezing, rales, rhonchi. No use of accessory muscles of respiration.  CARDIOVASCULAR: S1, S2 normal. No murmurs, rubs, or gallops.  ABDOMEN: Soft, nontender, nondistended. Bowel sounds present. No organomegaly or mass.  EXTREMITIES: No cyanosis, clubbing or edema b/l.    NEUROLOGIC: Cranial nerves II through XII are intact. No focal Motor or sensory deficits b/l.   PSYCHIATRIC:  patient is alert and oriented x 3.  SKIN: No obvious rash, lesion, or ulcer.   LABORATORY PANEL:  CBC  Recent Labs Lab 01/11/16 0413  WBC 6.9  HGB 10.9*  HCT 32.0*  PLT 163    Chemistries   Recent Labs Lab 01/11/16 0413  NA 142  K 3.3*  CL 108  CO2 28  GLUCOSE 80  BUN 11  CREATININE 1.02*  CALCIUM 8.7*  AST 68*  ALT 90*  ALKPHOS 216*  BILITOT 2.6*   Cardiac Enzymes No results for input(s): TROPONINI in the last 168 hours. RADIOLOGY:  Ct Abdomen Pelvis W Contrast  Addendum Date: 01/10/2016   ADDENDUM REPORT: 01/10/2016 21:03 ADDENDUM: Gallbladder wall thickening is more impressive on the coronal images. Stones are seen at the neck of the gallbladder. The common bile duct measures up to 11 mm, increased from the prior study. As suggested by ultrasound, this may  represent acute cholecystitis. At this age, gallbladder carcinoma is also considered. These results were called by telephone at the time of interpretation on 01/10/2016 at 8:59 pm to Dr. Tonita Cong , who verbally acknowledged these results. Electronically Signed   By: Marin Roberts M.D.    On: 01/10/2016 21:03   Result Date: 01/10/2016 CLINICAL DATA:  Epigastric abdominal pain. Nausea and burping. Symptoms for 2 days. Elevated temperature of 100.7 degrees. EXAM: CT ABDOMEN AND PELVIS WITH CONTRAST TECHNIQUE: Multidetector CT imaging of the abdomen and pelvis was performed using the standard protocol following bolus administration of intravenous contrast. CONTRAST:  75mL ISOVUE-300 IOPAMIDOL (ISOVUE-300) INJECTION 61% COMPARISON:  CT of the abdomen and pelvis 08/30/2013 FINDINGS: Lower chest: Small bilateral pleural effusions are present. There is associated atelectasis. Focal atelectasis is present in the lingula. No other focal nodule, mass, or airspace disease is present. The heart size is upper limits of normal. No significant pericardial effusion is present. Hepatobiliary: No focal hepatic lesions are present. Liver contour is within normal limits. The common bile duct is within normal limits. Multiple layering stones are again seen within the gallbladder. No inflammatory changes are present to suggest cholecystitis. Pancreas: No significant inflammatory changes are present. No solid or cystic mass lesion is present. There is no significant ductal dilation. Spleen: Within normal limits Adrenals/Urinary Tract: The adrenal glands are normal bilaterally. The kidneys are unremarkable. There are no renal calculi. There is no nephrolithiasis or ureteral obstruction. The urinary bladder is within normal limits. Stomach/Bowel: Stomach and duodenum are within normal limits. The small bowel is unremarkable. The appendix is visualized and normal. The ascending and transverse colon is within normal limits. Diverticular changes are present in the descending and sigmoid colon without focal inflammation to suggest diverticulitis. Vascular/Lymphatic: Minimal atherosclerotic calcifications are present in the aorta without aneurysm. No significant adenopathy is present. Reproductive: The uterus and adnexa are  within normal limits. Other: No significant free fluid or free air is present. Musculoskeletal: Rightward curvature of the lumbar spine is centered at L2-3. There is a vacuum disc at L2-3, L3-4,, L4-5, and L5-S1. Grade 1 anterolisthesis is present at L4-5 and L5-S1. Asymmetric endplate sclerotic changes are noted on the left at L2-3. Vertebral body heights are maintained. The bony pelvis is intact. Degenerative changes are noted at both hips, right greater than left. IMPRESSION: 1. No acute or focal lesion to explain the patient's symptoms. 2. Small bilateral pleural effusions and associated atelectasis. 3. Cholelithiasis without evidence for cholecystitis. 4. Sigmoid diverticulosis without diverticulitis. 5. Atherosclerosis. Electronically Signed: By: Marin Roberts M.D. On: 01/10/2016 17:58   Mr Roe Coombs W/wo Cm/mrcp  Result Date: 01/11/2016 CLINICAL DATA:  Hyperbilirubinemia. Dysphagia. Belching. Weight loss. EXAM: MRI ABDOMEN WITHOUT AND WITH CONTRAST (INCLUDING MRCP) TECHNIQUE: Multiplanar multisequence MR imaging of the abdomen was performed both before and after the administration of intravenous contrast. Heavily T2-weighted images of the biliary and pancreatic ducts were obtained, and three-dimensional MRCP images were rendered by post processing. CONTRAST:  20mL MULTIHANCE GADOBENATE DIMEGLUMINE 529 MG/ML IV SOLN COMPARISON:  01/10/2016 FINDINGS: Despite efforts by the technologist and patient, motion artifact is present on today's exam and could not be eliminated. The severely degraded images reduce exam sensitivity and specificity. Lower chest: Small bilateral pleural effusions with passive atelectasis. Mild cardiomegaly. Hepatobiliary: Features blurred or obscured by motion artifact on many sequences. Layering gallstones in the gallbladder. Irregular and somewhat thickened gallbladder wall suspected. There is probably a layer of sludge in the gallbladder. No biliary dilatation. No filling  defect in  the common bile duct or common hepatic duct. I do not see an obvious liver lesion, but sensitivity is dramatically reduced by the severe degree of motion artifact on the postcontrast images, causing a zebra-stripe pattern across the liver parenchyma which is severe. The patient was unable to breath hold on today's exam. Pancreas: 6 by 5 mm T2 hyperintense lesion medially in the pancreatic head on image 16/6, probably previously present and of similar size on 08/30/2013. Spleen: Unremarkable Adrenals/Urinary Tract: Unremarkable Stomach/Bowel: Unremarkable Vascular/Lymphatic: Unremarkable Other: No supplemental non-categorized findings. Musculoskeletal: Lumbar spondylosis. IMPRESSION: 1. Layering gallstones in the gallbladder with gallbladder wall thickening which is somewhat irregular, and likely a layer of sludge in the gallbladder. Findings are obscured by severe motion artifact. This could represent chronic cholecystitis, but could also be due to the patient's hypoproteinemia. Gallbladder mass is considered less likely but not totally excluded, particularly along the fundus. 2. Small bilateral pleural effusions with passive atelectasis. 3. Mild cardiomegaly. 4. Lumbar spondylosis. 5. Tiny fluid signal intensity lesion medially in the pancreatic head, about 5 mm diameter, probably stable from 08/30/2013 and likely clinically inconsequential. 6. Images on today's exam are severely degraded by motion artifact, reducing diagnostic sensitivity and specificity. The patient was unable to breath hold. Electronically Signed   By: Gaylyn Rong M.D.   On: 01/11/2016 12:26   US Abdomen Limited Ruq  Result Date: 01/10/2016 CLINICAL DATA:  Initial evaluation for acute abdominal pain for 2 months. History of cholelithiasis. EXAM: US ABDOMEN LIMITED - RIGHT UPPER QUADRANT COMPARISON:  Prior CT from 08/30/2016. FINDINGS: Gallbladder: Multiple calculi present within the gallbladder lumen, largest of which measured  approximately 1 cm. Gallbladder wall thickened up to 3.7 mm. Small amount of adjacent free fluid within the abdomen. No sonographic Murphy sign elicited on exam. Common bile duct: Diameter: 6.8 mm Liver: No focal lesion identified. Within normal limits in parenchymal echogenicity. No made of a right pleural effusion. IMPRESSION: 1. Cholelithiasis with mild gallbladder wall thickening. Small amount of adjacent free fluid within the abdomen. Unclear whether the free fluid is related to intrinsic gallbladder pathology or overall volume status. Correlation with physical exam and laboratory values for possible acute cholecystitis recommended. No biliary dilatation. 2. Small right pleural effusion. Electronically Signed   By: Rise Mu M.D.   On: 01/10/2016 15:35   ASSESSMENT AND PLAN:  Moselle Younghans  is a 82 y.o. female with a known history of Hypertension, diabetes and kidney stone  * Subacute Cholelithiasis. Follow-up with Dr.Ely for possible surgery. The patient has low to moderate risk for surgery.  *Hypokalemia. Give potassium supplement, follow-up BMP and magnesium level.  *Abnormal liver function test. Possible due to Cholelithiasis.  *Hypertension, controlled Continue home meds  *Diabetes. Controlled, hold glimepiride and start sliding scale.  Case discussed with Care Management/Social Worker. Management plans discussed with the patient, family and they are in agreement.  CODE STATUS: full  DVT Prophylaxis: lovenox  TOTAL TIME TAKING CARE OF THIS PATIENT:25 minutes.  >50% time spent on counselling and coordination of care  POSSIBLE D/C IN 2-3 DAYS, DEPENDING ON CLINICAL CONDITION.  Note: This dictation was prepared with Dragon dictation along with smaller phrase technology. Any transcriptional errors that result from this process are unintentional.  Tulsi Crossett M.D on 01/12/2016 at 3:20 PM  Between 7am to 6pm - Pager - [phone]  After 6pm go to [email or web address] -  password [email or web address] [facility]  [facility]  Office  [phone]  CC: Primary care physician; DAVID Terance Hart,  MD

## 2016-01-12 NOTE — Care Management Obs Status (Signed)
[place] NOTIFICATION   Patient Details  Name: [name] MRN: [id number] Date of Birth: 09-Jan-1931   Medicare Observation Status Notification Given:  Yes, discussed MOON with Ms Senko and with daughter [name] at bedside.     Eryn Krejci A, RN 01/12/2016, 1:06 PM

## 2016-01-12 NOTE — Progress Notes (Signed)
Subjective:   She is feeling much better today with less abdominal discomfort no fever and no dysphagia. She was up in a chair when I saw her. She's tolerating her medications without difficulty. Her lab work is pending from this morning.  Vital signs in last 24 hours: Temp:  [97.6 F (36.4 C)-98.4 F (36.9 C)] 97.6 F (36.4 C) (08/19 0438) Pulse Rate:  [53-64] 64 (08/19 0438) Resp:  [16-20] 16 (08/19 0438) BP: (158-178)/(60-85) 178/85 (08/19 0438) SpO2:  [92 %-95 %] 95 % (08/19 0438) Last BM Date: 01/11/16  Intake/Output from previous day: 08/18 0701 - 08/19 0700 In: 1793 [I.V.:1293; IV Piggyback:500] Out: 4250 [Urine:4250]  Exam:  Abdominal exam is unremarkable. She is slightly jaundiced although her skin changes have improved since hospitalization. She has no rebound or guarding. She's breathing easily with no rales or wheezing.  Lab Results:  CBC  Recent Labs  01/10/16 1305 01/11/16 0413  WBC 8.1 6.9  HGB 11.2* 10.9*  HCT 32.6* 32.0*  PLT 176 163   CMP     Component Value Date/Time   NA 142 01/11/2016 0413   NA 132 (L) 08/30/2013 0053   K 3.3 (L) 01/11/2016 0413   K 3.3 (L) 08/30/2013 0053   CL 108 01/11/2016 0413   CL 96 (L) 08/30/2013 0053   CO2 28 01/11/2016 0413   CO2 32 08/30/2013 0053   GLUCOSE 80 01/11/2016 0413   GLUCOSE 107 (H) 08/30/2013 0053   BUN 11 01/11/2016 0413   BUN 18 08/30/2013 0053   CREATININE 1.02 (H) 01/11/2016 0413   CREATININE 1.14 08/30/2013 0053   CALCIUM 8.7 (L) 01/11/2016 0413   CALCIUM 8.9 08/30/2013 0053   PROT 6.4 (L) 01/11/2016 0413   ALBUMIN 3.6 01/11/2016 0413   AST 68 (H) 01/11/2016 0413   ALT 90 (H) 01/11/2016 0413   ALKPHOS 216 (H) 01/11/2016 0413   BILITOT 2.6 (H) 01/11/2016 0413   GFRNONAA 50 (L) 01/11/2016 0413   GFRNONAA 45 (L) 08/30/2013 0053   GFRAA 58 (L) 01/11/2016 0413   GFRAA 53 (L) 08/30/2013 0053   PT/INR  Recent Labs  01/11/16 0413  LABPROT 14.0  INR 1.08     Studies/Results:   Assessment/Plan: We talked with her about the options this point. She's agreed to see Dr. Allen Norris gastroenterology again to discuss the need for possible ERCP. We've also talk with her about considering surgical intervention depending on the outcome of that procedure. I also discussed possibility bowel side referral. Her family was present for the interview.

## 2016-01-12 NOTE — Progress Notes (Signed)
I have discussed this case with Dr. Allen Norris of gastroenterology. He does not feel ERCP is the appropriate choice for primary procedure. I am very concerned about possibility of biliary cancer and the situation but she continues to be acutely symptomatic. For that reason we will plan cholecystectomy tomorrow initially with laparoscopy possible open cholecystectomy. We will attempt to cholangiogram possible. The risks of this procedure been outlined to the patient and her family detail. They are in agreement.

## 2016-01-13 ENCOUNTER — Inpatient Hospital Stay: Payer: Medicare Other | Admitting: Anesthesiology

## 2016-01-13 ENCOUNTER — Encounter
Admission: EM | Disposition: A | Discharge status: Home / Self Care | Payer: Self-pay | Source: Home / Self Care | Attending: Surgery

## 2016-01-13 ENCOUNTER — Encounter: Payer: Self-pay | Admitting: Anesthesiology

## 2016-01-13 DIAGNOSIS — K8011 Calculus of gallbladder with chronic cholecystitis with obstruction: Secondary | ICD-10-CM | POA: Diagnosis present

## 2016-01-13 DIAGNOSIS — E43 Unspecified severe protein-calorie malnutrition: Secondary | ICD-10-CM | POA: Diagnosis present

## 2016-01-13 DIAGNOSIS — R1310 Dysphagia, unspecified: Secondary | ICD-10-CM | POA: Diagnosis present

## 2016-01-13 DIAGNOSIS — E669 Obesity, unspecified: Secondary | ICD-10-CM | POA: Diagnosis present

## 2016-01-13 DIAGNOSIS — R634 Abnormal weight loss: Secondary | ICD-10-CM | POA: Diagnosis present

## 2016-01-13 DIAGNOSIS — K8010 Calculus of gallbladder with chronic cholecystitis without obstruction: Secondary | ICD-10-CM | POA: Diagnosis not present

## 2016-01-13 DIAGNOSIS — K830 Cholangitis: Secondary | ICD-10-CM | POA: Diagnosis present

## 2016-01-13 DIAGNOSIS — Z6836 Body mass index (BMI) 36.0-36.9, adult: Secondary | ICD-10-CM | POA: Diagnosis not present

## 2016-01-13 DIAGNOSIS — E119 Type 2 diabetes mellitus without complications: Secondary | ICD-10-CM | POA: Diagnosis present

## 2016-01-13 DIAGNOSIS — Z7984 Long term (current) use of oral hypoglycemic drugs: Secondary | ICD-10-CM | POA: Diagnosis not present

## 2016-01-13 DIAGNOSIS — R1011 Right upper quadrant pain: Secondary | ICD-10-CM | POA: Diagnosis present

## 2016-01-13 DIAGNOSIS — I10 Essential (primary) hypertension: Secondary | ICD-10-CM | POA: Diagnosis present

## 2016-01-13 DIAGNOSIS — K30 Functional dyspepsia: Secondary | ICD-10-CM | POA: Diagnosis present

## 2016-01-13 DIAGNOSIS — K8309 Other cholangitis: Secondary | ICD-10-CM

## 2016-01-13 DIAGNOSIS — K219 Gastro-esophageal reflux disease without esophagitis: Secondary | ICD-10-CM | POA: Diagnosis present

## 2016-01-13 DIAGNOSIS — E876 Hypokalemia: Secondary | ICD-10-CM | POA: Diagnosis present

## 2016-01-13 HISTORY — PX: ERCP: SHX5425

## 2016-01-13 LAB — CBC
HCT: 37 % (ref 35.0–47.0)
Hemoglobin: 12.5 g/dL (ref 12.0–16.0)
MCH: 29.4 pg (ref 26.0–34.0)
MCHC: 33.7 g/dL (ref 32.0–36.0)
MCV: 87.3 fL (ref 80.0–100.0)
Platelets: 234 10*3/uL (ref 150–440)
RBC: 4.24 MIL/uL (ref 3.80–5.20)
RDW: 16.8 % — ABNORMAL HIGH (ref 11.5–14.5)
WBC: 9.9 10*3/uL (ref 3.6–11.0)

## 2016-01-13 LAB — COMPREHENSIVE METABOLIC PANEL
ALT: 115 U/L — ABNORMAL HIGH (ref 14–54)
AST: 148 U/L — ABNORMAL HIGH (ref 15–41)
Albumin: 3.4 g/dL — ABNORMAL LOW (ref 3.5–5.0)
Alkaline Phosphatase: 371 U/L — ABNORMAL HIGH (ref 38–126)
Anion gap: 9 (ref 5–15)
BUN: 7 mg/dL (ref 6–20)
CO2: 24 mmol/L (ref 22–32)
Calcium: 8.5 mg/dL — ABNORMAL LOW (ref 8.9–10.3)
Chloride: 100 mmol/L — ABNORMAL LOW (ref 101–111)
Creatinine, Ser: 0.85 mg/dL (ref 0.44–1.00)
GFR calc Af Amer: >60 mL/min (ref >60)
GFR calc non Af Amer: >60 mL/min (ref >60)
Glucose, Bld: 156 mg/dL — ABNORMAL HIGH (ref 65–99)
Potassium: 3.9 mmol/L (ref 3.5–5.1)
Sodium: 133 mmol/L — ABNORMAL LOW (ref 135–145)
Total Bilirubin: 5.5 mg/dL — ABNORMAL HIGH (ref 0.3–1.2)
Total Protein: 6.6 g/dL (ref 6.5–8.1)

## 2016-01-13 LAB — GLUCOSE, CAPILLARY
Glucose-Capillary: 124 mg/dL — ABNORMAL HIGH (ref 65–99)
Glucose-Capillary: 234 mg/dL — ABNORMAL HIGH (ref 65–99)
Glucose-Capillary: 81 mg/dL (ref 65–99)

## 2016-01-13 SURGERY — ERCP, WITH INTERVENTION IF INDICATED
Anesthesia: General

## 2016-01-13 SURGERY — LAPAROSCOPIC CHOLECYSTECTOMY WITH INTRAOPERATIVE CHOLANGIOGRAM
Anesthesia: General

## 2016-01-13 MED ORDER — FENTANYL CITRATE (PF) 100 MCG/2ML IJ SOLN
25.0000 ug | INTRAMUSCULAR | Status: DC | PRN
Start: 2016-01-13 — End: 2016-01-13

## 2016-01-13 MED ORDER — SODIUM CHLORIDE 0.9 % IV SOLN
INTRAVENOUS | Status: DC | PRN
  Administered 2016-01-13: 11:00:00 via INTRAVENOUS

## 2016-01-13 MED ORDER — ONDANSETRON HCL 4 MG/2ML IJ SOLN: 4.0000 mg | Freq: Once | INTRAMUSCULAR | Status: DC | PRN

## 2016-01-13 MED ORDER — FENTANYL CITRATE (PF) 100 MCG/2ML IJ SOLN
INTRAMUSCULAR | Status: DC | PRN
  Administered 2016-01-13: 50 ug via INTRAVENOUS

## 2016-01-13 MED ORDER — SUGAMMADEX SODIUM 500 MG/5ML IV SOLN
INTRAVENOUS | Status: DC | PRN
  Administered 2016-01-13: 220 mg via INTRAVENOUS

## 2016-01-13 MED ORDER — INDOMETHACIN 50 MG RE SUPP
100.0000 mg | Freq: Once | RECTAL | Status: AC
  Administered 2016-01-13: 100 mg via RECTAL
  Filled 2016-01-13: qty 2

## 2016-01-13 MED ORDER — DIPHENHYDRAMINE HCL 25 MG PO CAPS
25.0000 mg | ORAL_CAPSULE | Freq: Four times a day (QID) | ORAL | Status: DC | PRN
  Administered 2016-01-13 – 2016-01-14 (×2): 25 mg via ORAL
  Filled 2016-01-13 (×2): qty 1

## 2016-01-13 MED ORDER — ROCURONIUM BROMIDE 100 MG/10ML IV SOLN
INTRAVENOUS | Status: DC | PRN
  Administered 2016-01-13: 20 mg via INTRAVENOUS

## 2016-01-13 MED ORDER — ONDANSETRON HCL 4 MG/2ML IJ SOLN
INTRAMUSCULAR | Status: DC | PRN
  Administered 2016-01-13: 4 mg via INTRAVENOUS

## 2016-01-13 MED ORDER — SUCCINYLCHOLINE CHLORIDE 20 MG/ML IJ SOLN
INTRAMUSCULAR | Status: DC | PRN
  Administered 2016-01-13: 100 mg via INTRAVENOUS

## 2016-01-13 MED ORDER — FENTANYL CITRATE (PF) 100 MCG/2ML IJ SOLN: 25.0000 ug | INTRAMUSCULAR | Status: DC | PRN

## 2016-01-13 MED ORDER — PROPOFOL 10 MG/ML IV BOLUS
INTRAVENOUS | Status: DC | PRN
  Administered 2016-01-13: 30 mg via INTRAVENOUS
  Administered 2016-01-13: 50 mg via INTRAVENOUS
  Administered 2016-01-13: 100 mg via INTRAVENOUS

## 2016-01-13 SURGICAL SUPPLY — 40 items
APPLIER CLIP ROT 10 11.4 M/L (STAPLE)
BAG COUNTER SPONGE EZ (MISCELLANEOUS) IMPLANT
CANISTER SUCT 1200ML W/VALVE (MISCELLANEOUS) IMPLANT
CATH REDDICK CHOLANGI 4FR 50CM (CATHETERS) IMPLANT
CHLORAPREP W/TINT 26ML (MISCELLANEOUS) IMPLANT
CLIP APPLIE ROT 10 11.4 M/L (STAPLE) IMPLANT
CONRAY 60ML FOR OR (MISCELLANEOUS) IMPLANT
COUNTER SPONGE BAG EZ (MISCELLANEOUS)
DRAPE SHEET LG 3/4 BI-LAMINATE (DRAPES) IMPLANT
DRSG TEGADERM 2-3/8X2-3/4 SM (GAUZE/BANDAGES/DRESSINGS) IMPLANT
DRSG TELFA 3X8 NADH (GAUZE/BANDAGES/DRESSINGS) IMPLANT
ELECT REM PT RETURN 9FT ADLT (ELECTROSURGICAL)
ELECTRODE REM PT RTRN 9FT ADLT (ELECTROSURGICAL) IMPLANT
GLOVE BIO SURGEON STRL SZ7.5 (GLOVE) IMPLANT
GLOVE INDICATOR 8.0 STRL GRN (GLOVE) IMPLANT
GOWN STRL REUS W/ TWL LRG LVL3 (GOWN DISPOSABLE) IMPLANT
GOWN STRL REUS W/TWL LRG LVL3 (GOWN DISPOSABLE)
GRASPER SUT TROCAR 14GX15 (MISCELLANEOUS) IMPLANT
IRRIGATION STRYKERFLOW (MISCELLANEOUS) IMPLANT
IRRIGATOR STRYKERFLOW (MISCELLANEOUS)
IV NS 1000ML (IV SOLUTION)
IV NS 1000ML BAXH (IV SOLUTION) IMPLANT
LABEL OR SOLS (LABEL) IMPLANT
NDL SAFETY 18GX1.5 (NEEDLE) IMPLANT
NEEDLE HYPO 25X1 1.5 SAFETY (NEEDLE) IMPLANT
NEEDLE INSUFFLATION 14GA 120MM (NEEDLE) IMPLANT
NS IRRIG 500ML POUR BTL (IV SOLUTION) IMPLANT
PACK LAP CHOLECYSTECTOMY (MISCELLANEOUS) IMPLANT
POUCH ENDO CATCH 10MM SPEC (MISCELLANEOUS) IMPLANT
SCISSORS METZENBAUM CVD 33 (INSTRUMENTS) IMPLANT
SEAL FOR SCOPE WARMER C3101 (MISCELLANEOUS) IMPLANT
SLEEVE ADV FIXATION 5X100MM (TROCAR) IMPLANT
SUT ETHILON 5-0 FS-2 18 BLK (SUTURE) IMPLANT
SUT VIC AB 0 CT2 27 (SUTURE) IMPLANT
SYR 3ML LL SCALE MARK (SYRINGE) IMPLANT
TROCAR Z-THREAD FIOS 11X100 BL (TROCAR) IMPLANT
TROCAR Z-THREAD OPTICAL 5X100M (TROCAR) IMPLANT
TROCAR Z-THREAD SLEEVE 11X100 (TROCAR) IMPLANT
TUBING INSUFFLATOR HI FLOW (MISCELLANEOUS) IMPLANT
WATER STERILE IRR 1000ML POUR (IV SOLUTION) IMPLANT

## 2016-01-13 NOTE — Anesthesia Procedure Notes (Signed)
Procedure Name: Intubation Date/Time: 01/13/2016 10:45 AM Performed by: Jonna Clark Pre-anesthesia Checklist: Patient identified, Patient being monitored, Timeout performed, Emergency Drugs available and Suction available Patient Re-evaluated:Patient Re-evaluated prior to inductionOxygen Delivery Method: Circle system utilized Preoxygenation: Pre-oxygenation with 100% oxygen Intubation Type: IV induction Ventilation: Mask ventilation without difficulty Laryngoscope Size: 3 and McGraph Grade View: Grade I Tube type: Oral Tube size: 7.0 mm Number of attempts: 1 Airway Equipment and Method: Stylet Placement Confirmation: ETT inserted through vocal cords under direct vision,  positive ETCO2 and breath sounds checked- equal and bilateral Secured at: 21 cm Tube secured with: Tape Dental Injury: Teeth and Oropharynx as per pre-operative assessment

## 2016-01-13 NOTE — Transfer of Care (Signed)
Immediate Anesthesia Transfer of Care Note  Patient: Victoria Dalton  Procedure(s) Performed: Procedure(s): ENDOSCOPIC RETROGRADE CHOLANGIOPANCREATOGRAPHY (ERCP) (N/A)  Patient Location: PACU  Anesthesia Type:General  Level of Consciousness: patient cooperative and lethargic  Airway & Oxygen Therapy: Patient Spontanous Breathing and Patient connected to face mask oxygen  Post-op Assessment: Report given to RN and Post -op Vital signs reviewed and stable  Post vital signs: Reviewed and stable  Last Vitals:  Vitals:   01/13/16 1001 01/13/16 1148  BP: (!) 166/66 (!) 125/55  Pulse: 62   Resp:  20  Temp: 36.6 C     Last Pain:  Vitals:   01/13/16 1001  TempSrc: Tympanic  PainSc:          Complications: No apparent anesthesia complications

## 2016-01-13 NOTE — Progress Notes (Signed)
NS IV fluids PACU total 50 ml

## 2016-01-13 NOTE — Anesthesia Postprocedure Evaluation (Signed)
Anesthesia Post Note  Patient: Victoria Dalton  Procedure(s) Performed: Procedure(s) (LRB): ENDOSCOPIC RETROGRADE CHOLANGIOPANCREATOGRAPHY (ERCP) (N/A)  Patient location during evaluation: PACU Anesthesia Type: General Level of consciousness: awake and alert Pain management: pain level controlled Vital Signs Assessment: post-procedure vital signs reviewed and stable Respiratory status: spontaneous breathing, nonlabored ventilation, respiratory function stable and patient connected to nasal cannula oxygen Cardiovascular status: blood pressure returned to baseline and stable Postop Assessment: no signs of nausea or vomiting Anesthetic complications: no    Last Vitals:  Vitals:   01/13/16 1242 01/13/16 1348  BP: (!) 159/61 (!) 150/50  Pulse: (!) 54 (!) 52  Resp: 18 18  Temp: 36.5 C 36.5 C    Last Pain:  Vitals:   01/13/16 1348  TempSrc: Oral  PainSc:                  Keshawn Sundberg S

## 2016-01-13 NOTE — Brief Op Note (Signed)
Pt. Intubated successfully for ERCP by J.Weatherly, Immunologist. Dr. Marcello Moores started new IV left lower arm.

## 2016-01-13 NOTE — Anesthesia Preprocedure Evaluation (Deleted)
Anesthesia Evaluation  Patient identified by MRN, date of birth, ID band Patient awake    Reviewed: Allergy & Precautions, NPO status , Patient's Chart, lab work & pertinent test results, reviewed documented beta blocker date and time   Airway Mallampati: III  TM Distance: >3 FB     Dental  (+) Chipped   Pulmonary           Cardiovascular hypertension, Pt. on medications and Pt. on home beta blockers      Neuro/Psych    GI/Hepatic GERD  ,  Endo/Other  diabetes, Type 2  Renal/GU Renal disease     Musculoskeletal   Abdominal   Peds  Hematology   Anesthesia Other Findings   Reproductive/Obstetrics                             Anesthesia Physical Anesthesia Plan  ASA: III  Anesthesia Plan: General   Post-op Pain Management:    Induction: Intravenous  Airway Management Planned: Oral ETT  Additional Equipment:   Intra-op Plan:   Post-operative Plan:   Informed Consent: I have reviewed the patients History and Physical, chart, labs and discussed the procedure including the risks, benefits and alternatives for the proposed anesthesia with the patient or authorized representative who has indicated his/her understanding and acceptance.     Plan Discussed with: CRNA  Anesthesia Plan Comments:         Anesthesia Quick Evaluation

## 2016-01-13 NOTE — Progress Notes (Signed)
[facility] [place] [facility]   PATIENT NAME: Victoria Dalton    MR#:  [id number]  DATE OF BIRTH:  June 29, 1930  SUBJECTIVE:   Pt admitted with ongoing symptoms of nause,burping, indigestion and weight for 2 months Feels better today Found to have gallstones REVIEW OF SYSTEMS:   Review of Systems  Constitutional: Positive for weight loss. Negative for chills and fever.  HENT: Negative for ear discharge, ear pain and nosebleeds.   Eyes: Negative for blurred vision, pain and discharge.  Respiratory: Negative for sputum production, shortness of breath, wheezing and stridor.   Cardiovascular: Negative for chest pain, palpitations, orthopnea and PND.  Gastrointestinal: Positive for nausea. Negative for abdominal pain, diarrhea and vomiting.  Genitourinary: Negative for frequency and urgency.  Musculoskeletal: Negative for back pain and joint pain.  Neurological: Positive for weakness. Negative for sensory change, speech change and focal weakness.  Psychiatric/Behavioral: Negative for depression and hallucinations. The patient is not nervous/anxious.    Tolerating Diet:yes Tolerating PT: pending  DRUG ALLERGIES:   Allergies  Allergen Reactions  . Ramipril Other (See Comments)    IRREGULAR HEART BEAT  . Penicillins Rash    Has patient had a PCN reaction causing immediate rash, facial/tongue/throat swelling, SOB or lightheadedness with hypotension: Yes Has patient had a PCN reaction causing severe rash involving mucus membranes or skin necrosis: Yes Has patient had a PCN reaction that required hospitalization No Has patient had a PCN reaction occurring within the last 10 years: No If all of the above answers are "NO", then may proceed with Cephalosporin use.   . Sulfa Antibiotics Rash    VITALS:  Blood pressure (!) 150/50, pulse (!) 52, temperature 97.7 F (36.5 C), temperature source Oral, resp. rate 18, height 5\' 8"  (1.727 m), weight 243 lb (110.2  kg), SpO2 91 %.  PHYSICAL EXAMINATION:   Physical Exam  GENERAL:  82 y.o.-year-old patient lying in the bed with no acute distress. obese EYES: Pupils equal, round, reactive to light and accommodation. No scleral icterus. Extraocular muscles intact.  HEENT: Head atraumatic, normocephalic. Oropharynx and nasopharynx clear.  NECK:  Supple, no jugular venous distention. No thyroid enlargement, no tenderness.  LUNGS: Normal breath sounds bilaterally, no wheezing, rales, rhonchi. No use of accessory muscles of respiration.  CARDIOVASCULAR: S1, S2 normal. No murmurs, rubs, or gallops.  ABDOMEN: Soft, nontender, nondistended. Bowel sounds present. No organomegaly or mass.  EXTREMITIES: No cyanosis, clubbing or edema b/l.    NEUROLOGIC: Cranial nerves II through XII are intact. No focal Motor or sensory deficits b/l.   PSYCHIATRIC:  patient is alert and oriented x 3.  SKIN: No obvious rash, lesion, or ulcer.   LABORATORY PANEL:  CBC  Recent Labs Lab 01/13/16 0625  WBC 9.9  HGB 12.5  HCT 37.0  PLT 234    Chemistries   Recent Labs Lab 01/13/16 0556  NA 133*  K 3.9  CL 100*  CO2 24  GLUCOSE 156*  BUN 7  CREATININE 0.85  CALCIUM 8.5*  AST 148*  ALT 115*  ALKPHOS 371*  BILITOT 5.5*   Cardiac Enzymes No results for input(s): TROPONINI in the last 168 hours. RADIOLOGY:  Ct Abdomen Pelvis W Contrast  Addendum Date: 01/10/2016   ADDENDUM REPORT: 01/10/2016 21:03 ADDENDUM: Gallbladder wall thickening is more impressive on the coronal images. Stones are seen at the neck of the gallbladder. The common bile duct measures up to 11 mm, increased from the prior study. As suggested by ultrasound, this  may represent acute cholecystitis. At 82 years old, gallbladder carcinoma is also considered. These results were called by telephone at the time of interpretation on 01/10/2016 at 8:59 pm to Dr. Tonita Cong , who verbally acknowledged these results. Electronically Signed   By: Marin Roberts  M.D.   On: 01/10/2016 21:03   Result Date: 01/10/2016 CLINICAL DATA:  Epigastric abdominal pain. Nausea and burping. Symptoms for 2 days. Elevated temperature of 100.7 degrees. EXAM: CT ABDOMEN AND PELVIS WITH CONTRAST TECHNIQUE: Multidetector CT imaging of the abdomen and pelvis was performed using the standard protocol following bolus administration of intravenous contrast. CONTRAST:  75mL ISOVUE-300 IOPAMIDOL (ISOVUE-300) INJECTION 61% COMPARISON:  CT of the abdomen and pelvis 08/30/2013 FINDINGS: Lower chest: Small bilateral pleural effusions are present. There is associated atelectasis. Focal atelectasis is present in the lingula. No other focal nodule, mass, or airspace disease is present. The heart size is upper limits of normal. No significant pericardial effusion is present. Hepatobiliary: No focal hepatic lesions are present. Liver contour is within normal limits. The common bile duct is within normal limits. Multiple layering stones are again seen within the gallbladder. No inflammatory changes are present to suggest cholecystitis. Pancreas: No significant inflammatory changes are present. No solid or cystic mass lesion is present. There is no significant ductal dilation. Spleen: Within normal limits Adrenals/Urinary Tract: The adrenal glands are normal bilaterally. The kidneys are unremarkable. There are no renal calculi. There is no nephrolithiasis or ureteral obstruction. The urinary bladder is within normal limits. Stomach/Bowel: Stomach and duodenum are within normal limits. The small bowel is unremarkable. The appendix is visualized and normal. The ascending and transverse colon is within normal limits. Diverticular changes are present in the descending and sigmoid colon without focal inflammation to suggest diverticulitis. Vascular/Lymphatic: Minimal atherosclerotic calcifications are present in the aorta without aneurysm. No significant adenopathy is present. Reproductive: The uterus and adnexa  are within normal limits. Other: No significant free fluid or free air is present. Musculoskeletal: Rightward curvature of the lumbar spine is centered at L2-3. There is a vacuum disc at L2-3, L3-4,, L4-5, and L5-S1. Grade 1 anterolisthesis is present at L4-5 and L5-S1. Asymmetric endplate sclerotic changes are noted on the left at L2-3. Vertebral body heights are maintained. The bony pelvis is intact. Degenerative changes are noted at both hips, right greater than left. IMPRESSION: 1. No acute or focal lesion to explain the patient's symptoms. 2. Small bilateral pleural effusions and associated atelectasis. 3. Cholelithiasis without evidence for cholecystitis. 4. Sigmoid diverticulosis without diverticulitis. 5. Atherosclerosis. Electronically Signed: By: Marin Roberts M.D. On: 01/10/2016 17:58   Mr Roe Coombs W/wo Cm/mrcp  Result Date: 01/11/2016 CLINICAL DATA:  Hyperbilirubinemia. Dysphagia. Belching. Weight loss. EXAM: MRI ABDOMEN WITHOUT AND WITH CONTRAST (INCLUDING MRCP) TECHNIQUE: Multiplanar multisequence MR imaging of the abdomen was performed both before and after the administration of intravenous contrast. Heavily T2-weighted images of the biliary and pancreatic ducts were obtained, and three-dimensional MRCP images were rendered by post processing. CONTRAST:  20mL MULTIHANCE GADOBENATE DIMEGLUMINE 529 MG/ML IV SOLN COMPARISON:  01/10/2016 FINDINGS: Despite efforts by the technologist and patient, motion artifact is present on today's exam and could not be eliminated. The severely degraded images reduce exam sensitivity and specificity. Lower chest: Small bilateral pleural effusions with passive atelectasis. Mild cardiomegaly. Hepatobiliary: Features blurred or obscured by motion artifact on many sequences. Layering gallstones in the gallbladder. Irregular and somewhat thickened gallbladder wall suspected. There is probably a layer of sludge in the gallbladder. No biliary dilatation. No  filling defect  in the common bile duct or common hepatic duct. I do not see an obvious liver lesion, but sensitivity is dramatically reduced by the severe degree of motion artifact on the postcontrast images, causing a zebra-stripe pattern across the liver parenchyma which is severe. The patient was unable to breath hold on today's exam. Pancreas: 6 by 5 mm T2 hyperintense lesion medially in the pancreatic head on image 16/6, probably previously present and of similar size on 08/30/2013. Spleen: Unremarkable Adrenals/Urinary Tract: Unremarkable Stomach/Bowel: Unremarkable Vascular/Lymphatic: Unremarkable Other: No supplemental non-categorized findings. Musculoskeletal: Lumbar spondylosis. IMPRESSION: 1. Layering gallstones in the gallbladder with gallbladder wall thickening which is somewhat irregular, and likely a layer of sludge in the gallbladder. Findings are obscured by severe motion artifact. This could represent chronic cholecystitis, but could also be due to the patient's hypoproteinemia. Gallbladder mass is considered less likely but not totally excluded, particularly along the fundus. 2. Small bilateral pleural effusions with passive atelectasis. 3. Mild cardiomegaly. 4. Lumbar spondylosis. 5. Tiny fluid signal intensity lesion medially in the pancreatic head, about 5 mm diameter, probably stable from 08/30/2013 and likely clinically inconsequential. 6. Images on today's exam are severely degraded by motion artifact, reducing diagnostic sensitivity and specificity. The patient was unable to breath hold. Electronically Signed   By: Gaylyn Rong M.D.   On: 01/11/2016 12:26   US Abdomen Limited Ruq  Result Date: 01/10/2016 CLINICAL DATA:  Initial evaluation for acute abdominal pain for 2 months. History of cholelithiasis. EXAM: US ABDOMEN LIMITED - RIGHT UPPER QUADRANT COMPARISON:  Prior CT from 08/30/2016. FINDINGS: Gallbladder: Multiple calculi present within the gallbladder lumen, largest of which measured  approximately 1 cm. Gallbladder wall thickened up to 3.7 mm. Small amount of adjacent free fluid within the abdomen. No sonographic Murphy sign elicited on exam. Common bile duct: Diameter: 6.8 mm Liver: No focal lesion identified. Within normal limits in parenchymal echogenicity. No made of a right pleural effusion. IMPRESSION: 1. Cholelithiasis with mild gallbladder wall thickening. Small amount of adjacent free fluid within the abdomen. Unclear whether the free fluid is related to intrinsic gallbladder pathology or overall volume status. Correlation with physical exam and laboratory values for possible acute cholecystitis recommended. No biliary dilatation. 2. Small right pleural effusion. Electronically Signed   By: Rise Mu M.D.   On: 01/10/2016 15:35   ASSESSMENT AND PLAN:  Mahveen Leeder  is a 82 y.o. female with a known history of Hypertension, diabetes and kidney stone  * Subacute Cholelithiasis. Follow-up with Dr.Ely for possible surgery. The patient has low to moderate risk for surgery.  *Hypokalemia. Give potassium supplement, follow-up BMP and magnesium level.  *Abnormal liver function test. Possible due to Cholelithiasis. -with rising bilirubin pt underwent ERCP that showed biliary sludge. Stent x1 was placed  *Hypertension, controlled Continue home meds Prn hydralazine  *Diabetes. Controlled, hold glimepiride and start sliding scale.  Case discussed with Care Management/Social Worker. Management plans discussed with the patient, family and they are in agreement.  CODE STATUS: full  DVT Prophylaxis: lovenox  TOTAL TIME TAKING CARE OF THIS PATIENT:25 minutes.  >50% time spent on counselling and coordination of care  POSSIBLE D/C IN 2-3 DAYS, DEPENDING ON CLINICAL CONDITION.  Note: This dictation was prepared with Dragon dictation along with smaller phrase technology. Any transcriptional errors that result from this process are unintentional.  Emya Picado  M.D on 01/13/2016 at 2:44 PM  Between 7am to 6pm - Pager - [phone]  After 6pm go to [email or web address] - password  [facility]  [facility]  Office  [phone]  CC: Primary care physician; Dorothey Baseman, MD

## 2016-01-13 NOTE — Anesthesia Preprocedure Evaluation (Addendum)
Anesthesia Evaluation  Patient identified by MRN, date of birth, ID band Patient awake    Reviewed: Allergy & Precautions, NPO status , Patient's Chart, lab work & pertinent test results, reviewed documented beta blocker date and time   Airway Mallampati: II  TM Distance: >3 FB     Dental  (+) Chipped, Poor Dentition, Dental Advisory Given   Pulmonary           Cardiovascular hypertension, Pt. on medications and Pt. on home beta blockers      Neuro/Psych    GI/Hepatic GERD  ,  Endo/Other  diabetes  Renal/GU Renal disease     Musculoskeletal   Abdominal   Peds  Hematology   Anesthesia Other Findings   Reproductive/Obstetrics                            Anesthesia Physical Anesthesia Plan  ASA: III  Anesthesia Plan: General   Post-op Pain Management:    Induction: Intravenous  Airway Management Planned: Oral ETT  Additional Equipment:   Intra-op Plan:   Post-operative Plan:   Informed Consent: I have reviewed the patients History and Physical, chart, labs and discussed the procedure including the risks, benefits and alternatives for the proposed anesthesia with the patient or authorized representative who has indicated his/her understanding and acceptance.     Plan Discussed with: CRNA  Anesthesia Plan Comments:         Anesthesia Quick Evaluation

## 2016-01-13 NOTE — Op Note (Addendum)
[facility] Patient Name: Victoria Dalton Procedure Date: 01/13/2016 10:33 AM MRN: [id number] Account #: [id number] Date of Birth: 03/28/1931 Admit Type: Inpatient Age: 82 Room: [facility] ENDO ROOM 4 Gender: Female Note Status: Supervisor Override Procedure:            ERCP Indications:          Suspected ascending cholangitis Providers:            Lucilla Lame MD, MD Medicines:            General Anesthesia Complications:        No immediate complications. Procedure:            Pre-Anesthesia Assessment:                       - Prior to the procedure, a History and Physical was                        performed, and patient medications and allergies were                        reviewed. The patient's tolerance of previous                        anesthesia was also reviewed. The risks and benefits of                        the procedure and the sedation options and risks were                        discussed with the patient. All questions were                        answered, and informed consent was obtained. Prior                        Anticoagulants: The patient has taken no previous                        anticoagulant or antiplatelet agents. ASA Grade                        Assessment: II - A patient with mild systemic disease.                        After reviewing the risks and benefits, the patient was                        deemed in satisfactory condition to undergo the                        procedure.                       After obtaining informed consent, the scope was passed                        under direct vision. Throughout the procedure, the                        patient's blood pressure, pulse, and  oxygen saturations                        were monitored continuously. The Endosonoscope was                        introduced through the mouth, and used to inject                        contrast into and used to cannulate the bile  duct. The                        ERCP was accomplished without difficulty. The patient                        tolerated the procedure well. Findings:      The scout film was normal. The esophagus was successfully intubated       under direct vision. The scope was advanced to a normal major papilla in       the descending duodenum without detailed examination of the pharynx,       larynx and associated structures, and upper GI tract. The upper GI tract       was grossly normal. The bile duct was deeply cannulated. Contrast was       injected. I personally interpreted the bile duct images. There was brisk       flow of contrast through the ducts. Image quality was excellent.       Contrast extended to the entire biliary tree. A wire was passed into the       biliary tree. Biliary sphincterotomy was made with a traction (standard)       sphincterotome using ERBE electrocautery. There was no       post-sphincterotomy bleeding. To discover objects, the biliary tree was       swept with a 15 mm balloon starting at the bifurcation. Sludge was swept       from the duct. One 10 Fr by 7 cm plastic stent with a single external       flap and a single internal flap was placed 5 cm into the common bile       duct. Bile flowed through the stent. The stent was in good position. Impression:           - A biliary sphincterotomy was performed.                       - The biliary tree was swept and sludge was found.                       - One plastic stent was placed into the common bile                        duct. Recommendation:       - Watch for pancreatitis, bleeding, perforation, and                        cholangitis.                       - Clear liquid diet. Procedure Code(s):    --- Professional ---  [id number], Endoscopic retrograde cholangiopancreatography                        (ERCP); with placement of endoscopic stent into biliary                        or pancreatic duct,  including pre- and post-dilation                        and guide wire passage, when performed, including                        sphincterotomy, when performed, each stent                       [id number], Endoscopic retrograde cholangiopancreatography                        (ERCP); with removal of calculi/debris from                        biliary/pancreatic duct(s)                       [id number], Endoscopic catheterization of the biliary ductal                        system, radiological supervision and interpretation Diagnosis Code(s):    --- Professional ---                       K83.0, Cholangitis CPT copyright 2016 American Medical Association. All rights reserved. The codes documented in this report are preliminary and upon coder review may  be revised to meet current compliance requirements. Lucilla Lame MD, MD 01/13/2016 11:39:56 AM This report has been signed electronically. Number of Addenda: 0 Note Initiated On: 01/13/2016 10:33 AM      [facility]

## 2016-01-13 NOTE — Progress Notes (Signed)
Subjective:   She had a episode of significant abdominal pain last evening with mild nausea. She became diaphoretic most uncomfortable and her symptoms resolved over a couple of hours. This morning her white blood cell count remains normal but her bilirubin is gone from 2.5-5.5. Clinically she feels better but the increase in her bilirubin is alarming.  Vital signs in last 24 hours: Temp:  [97.7 F (36.5 C)-99.4 F (37.4 C)] 99.4 F (37.4 C) (08/20 0618) Pulse Rate:  [63-76] 63 (08/20 0618) Resp:  [16-20] 20 (08/20 0618) BP: (146-180)/(60-78) 157/60 (08/20 0618) SpO2:  [94 %-96 %] 96 % (08/20 0618) Last BM Date: 01/11/16  Intake/Output from previous day: 08/19 0701 - 08/20 0700 In: 3244 [P.O.:240; I.V.:2503; IV Piggyback:501] Out: 3250 [Urine:3250]  Exam:  Her exam is unchanged. She has some mild right upper quadrant pain. She does appear to be jaundiced looking at her skin. Her lungs are clear with no adventitious sounds and no wheezing.  Lab Results:  CBC  Recent Labs  01/11/16 0413 01/13/16 0625  WBC 6.9 9.9  HGB 10.9* 12.5  HCT 32.0* 37.0  PLT 163 234   CMP     Component Value Date/Time   NA 133 (L) 01/13/2016 0556   NA 132 (L) 08/30/2013 0053   K 3.9 01/13/2016 0556   K 3.3 (L) 08/30/2013 0053   CL 100 (L) 01/13/2016 0556   CL 96 (L) 08/30/2013 0053   CO2 24 01/13/2016 0556   CO2 32 08/30/2013 0053   GLUCOSE 156 (H) 01/13/2016 0556   GLUCOSE 107 (H) 08/30/2013 0053   BUN 7 01/13/2016 0556   BUN 18 08/30/2013 0053   CREATININE 0.85 01/13/2016 0556   CREATININE 1.14 08/30/2013 0053   CALCIUM 8.5 (L) 01/13/2016 0556   CALCIUM 8.9 08/30/2013 0053   PROT 6.6 01/13/2016 0556   ALBUMIN 3.4 (L) 01/13/2016 0556   AST 148 (H) 01/13/2016 0556   ALT 115 (H) 01/13/2016 0556   ALKPHOS 371 (H) 01/13/2016 0556   BILITOT 5.5 (H) 01/13/2016 0556   GFRNONAA >60 01/13/2016 0556   GFRNONAA 45 (L) 08/30/2013 0053   GFRAA >60 01/13/2016 0556   GFRAA 53 (L) 08/30/2013 0053    PT/INR  Recent Labs  01/11/16 0413  LABPROT 14.0  INR 1.08    Studies/Results: Mr Abd W/wo Cm/mrcp  Result Date: 01/11/2016 CLINICAL DATA:  Hyperbilirubinemia. Dysphagia. Belching. Weight loss. EXAM: MRI ABDOMEN WITHOUT AND WITH CONTRAST (INCLUDING MRCP) TECHNIQUE: Multiplanar multisequence MR imaging of the abdomen was performed both before and after the administration of intravenous contrast. Heavily T2-weighted images of the biliary and pancreatic ducts were obtained, and three-dimensional MRCP images were rendered by post processing. CONTRAST:  29mL MULTIHANCE GADOBENATE DIMEGLUMINE 529 MG/ML IV SOLN COMPARISON:  01/10/2016 FINDINGS: Despite efforts by the technologist and patient, motion artifact is present on today's exam and could not be eliminated. The severely degraded images reduce exam sensitivity and specificity. Lower chest: Small bilateral pleural effusions with passive atelectasis. Mild cardiomegaly. Hepatobiliary: Features blurred or obscured by motion artifact on many sequences. Layering gallstones in the gallbladder. Irregular and somewhat thickened gallbladder wall suspected. There is probably a layer of sludge in the gallbladder. No biliary dilatation. No filling defect in the common bile duct or common hepatic duct. I do not see an obvious liver lesion, but sensitivity is dramatically reduced by the severe degree of motion artifact on the postcontrast images, causing a zebra-stripe pattern across the liver parenchyma which is severe. The patient was unable  to breath hold on today's exam. Pancreas: 6 by 5 mm T2 hyperintense lesion medially in the pancreatic head on image 16/6, probably previously present and of similar size on 08/30/2013. Spleen: Unremarkable Adrenals/Urinary Tract: Unremarkable Stomach/Bowel: Unremarkable Vascular/Lymphatic: Unremarkable Other: No supplemental non-categorized findings. Musculoskeletal: Lumbar spondylosis. IMPRESSION: 1. Layering gallstones in the  gallbladder with gallbladder wall thickening which is somewhat irregular, and likely a layer of sludge in the gallbladder. Findings are obscured by severe motion artifact. This could represent chronic cholecystitis, but could also be due to the patient's hypoproteinemia. Gallbladder mass is considered less likely but not totally excluded, particularly along the fundus. 2. Small bilateral pleural effusions with passive atelectasis. 3. Mild cardiomegaly. 4. Lumbar spondylosis. 5. Tiny fluid signal intensity lesion medially in the pancreatic head, about 5 mm diameter, probably stable from 08/30/2013 and likely clinically inconsequential. 6. Images on today's exam are severely degraded by motion artifact, reducing diagnostic sensitivity and specificity. The patient was unable to breath hold. Electronically Signed   By: Van Clines M.D.   On: 01/11/2016 12:26    Assessment/Plan: I have discussed situation with the patient and family and Dr. Allen Norris. I am concerned with the change in her bilirubin that she does have ductal obstruction. We will delay surgical intervention this morning and see if we can proceed with an ERCP. I talk to the family at length and I believe they are in agreement with this plan. Dr. Allen Norris will see her later this morning to make final decisions. We will then make a decision about surgical intervention in the next 24-48 hours depending on the results of her ERCP.

## 2016-01-14 ENCOUNTER — Encounter: Payer: Self-pay | Admitting: Gastroenterology

## 2016-01-14 LAB — CBC
HCT: 33.8 % — ABNORMAL LOW (ref 35.0–47.0)
Hemoglobin: 11.5 g/dL — ABNORMAL LOW (ref 12.0–16.0)
MCH: 30 pg (ref 26.0–34.0)
MCHC: 34.1 g/dL (ref 32.0–36.0)
MCV: 87.8 fL (ref 80.0–100.0)
Platelets: 197 10*3/uL (ref 150–440)
RBC: 3.85 MIL/uL (ref 3.80–5.20)
RDW: 16.8 % — ABNORMAL HIGH (ref 11.5–14.5)
WBC: 8.1 10*3/uL (ref 3.6–11.0)

## 2016-01-14 LAB — GLUCOSE, CAPILLARY
Glucose-Capillary: 128 mg/dL — ABNORMAL HIGH (ref 65–99)
Glucose-Capillary: 158 mg/dL — ABNORMAL HIGH (ref 65–99)
Glucose-Capillary: 154 mg/dL — ABNORMAL HIGH (ref 65–99)
Glucose-Capillary: 133 mg/dL — ABNORMAL HIGH (ref 65–99)

## 2016-01-14 LAB — COMPREHENSIVE METABOLIC PANEL
ALT: 101 U/L — ABNORMAL HIGH (ref 14–54)
AST: 108 U/L — ABNORMAL HIGH (ref 15–41)
Albumin: 3.1 g/dL — ABNORMAL LOW (ref 3.5–5.0)
Alkaline Phosphatase: 324 U/L — ABNORMAL HIGH (ref 38–126)
Anion gap: 6 (ref 5–15)
BUN: 9 mg/dL (ref 6–20)
CO2: 27 mmol/L (ref 22–32)
Calcium: 8.4 mg/dL — ABNORMAL LOW (ref 8.9–10.3)
Chloride: 103 mmol/L (ref 101–111)
Creatinine, Ser: 0.97 mg/dL (ref 0.44–1.00)
GFR calc Af Amer: >60 mL/min (ref >60)
GFR calc non Af Amer: 53 mL/min — ABNORMAL LOW (ref >60)
Glucose, Bld: 121 mg/dL — ABNORMAL HIGH (ref 65–99)
Potassium: 4 mmol/L (ref 3.5–5.1)
Sodium: 136 mmol/L (ref 135–145)
Total Bilirubin: 3.7 mg/dL — ABNORMAL HIGH (ref 0.3–1.2)
Total Protein: 6.3 g/dL — ABNORMAL LOW (ref 6.5–8.1)

## 2016-01-14 LAB — LIPASE, BLOOD: Lipase: 37 U/L (ref 11–51)

## 2016-01-14 NOTE — Progress Notes (Signed)
[facility] - [place] [facility]   PATIENT NAME: Victoria Dalton    MR#:  [id number]  DATE OF BIRTH:  04/26/1931  SUBJECTIVE:   Pt admitted with ongoing symptoms of nause,burping, indigestion and weight for 2 months Feels better today S/p CBD stent placement REVIEW OF SYSTEMS:   Review of Systems  Constitutional: Positive for weight loss. Negative for chills and fever.  HENT: Negative for ear discharge, ear pain and nosebleeds.   Eyes: Negative for blurred vision, pain and discharge.  Respiratory: Negative for sputum production, shortness of breath, wheezing and stridor.   Cardiovascular: Negative for chest pain, palpitations, orthopnea and PND.  Gastrointestinal: Positive for nausea. Negative for abdominal pain, diarrhea and vomiting.  Genitourinary: Negative for frequency and urgency.  Musculoskeletal: Negative for back pain and joint pain.  Neurological: Positive for weakness. Negative for sensory change, speech change and focal weakness.  Psychiatric/Behavioral: Negative for depression and hallucinations. The patient is not nervous/anxious.    Tolerating Diet:yes Tolerating PT: pending  DRUG ALLERGIES:   Allergies  Allergen Reactions  . Ramipril Other (See Comments)    IRREGULAR HEART BEAT  . Penicillins Rash    Has patient had a PCN reaction causing immediate rash, facial/tongue/throat swelling, SOB or lightheadedness with hypotension: Yes Has patient had a PCN reaction causing severe rash involving mucus membranes or skin necrosis: Yes Has patient had a PCN reaction that required hospitalization No Has patient had a PCN reaction occurring within the last 10 years: No If all of the above answers are "NO", then may proceed with Cephalosporin use.   . Sulfa Antibiotics Rash    VITALS:  Blood pressure (!) 117/36, pulse 60, temperature 97.8 F (36.6 C), temperature source Oral, resp. rate (!) 22, height 5\' 8"  (1.727 m), weight 243 lb (110.2  kg), SpO2 97 %.  PHYSICAL EXAMINATION:   Physical Exam  GENERAL:  82 y.o.-year-old patient lying in the bed with no acute distress. obese EYES: Pupils equal, round, reactive to light and accommodation. No scleral icterus. Extraocular muscles intact.  HEENT: Head atraumatic, normocephalic. Oropharynx and nasopharynx clear.  NECK:  Supple, no jugular venous distention. No thyroid enlargement, no tenderness.  LUNGS: Normal breath sounds bilaterally, no wheezing, rales, rhonchi. No use of accessory muscles of respiration.  CARDIOVASCULAR: S1, S2 normal. No murmurs, rubs, or gallops.  ABDOMEN: Soft, nontender, nondistended. Bowel sounds present. No organomegaly or mass.  EXTREMITIES: No cyanosis, clubbing or edema b/l.    NEUROLOGIC: Cranial nerves II through XII are intact. No focal Motor or sensory deficits b/l.   PSYCHIATRIC:  patient is alert and oriented x 3.  SKIN: No obvious rash, lesion, or ulcer.   LABORATORY PANEL:  CBC  Recent Labs Lab 01/14/16 0618  WBC 8.1  HGB 11.5*  HCT 33.8*  PLT 197    Chemistries   Recent Labs Lab 01/14/16 0618  NA 136  K 4.0  CL 103  CO2 27  GLUCOSE 121*  BUN 9  CREATININE 0.97  CALCIUM 8.4*  AST 108*  ALT 101*  ALKPHOS 324*  BILITOT 3.7*   Cardiac Enzymes No results for input(s): TROPONINI in the last 168 hours. RADIOLOGY:  Ct Abdomen Pelvis W Contrast  Addendum Date: 01/10/2016   ADDENDUM REPORT: 01/10/2016 21:03 ADDENDUM: Gallbladder wall thickening is more impressive on the coronal images. Stones are seen at the neck of the gallbladder. The common bile duct measures up to 11 mm, increased from the prior study. As suggested by ultrasound, this  may represent acute cholecystitis. At this age, gallbladder carcinoma is also considered. These results were called by telephone at the time of interpretation on 01/10/2016 at 8:59 pm to Dr. Tonita Cong , who verbally acknowledged these results. Electronically Signed   By: Marin Roberts  M.D.   On: 01/10/2016 21:03   Result Date: 01/10/2016 CLINICAL DATA:  Epigastric abdominal pain. Nausea and burping. Symptoms for 2 days. Elevated temperature of 100.7 degrees. EXAM: CT ABDOMEN AND PELVIS WITH CONTRAST TECHNIQUE: Multidetector CT imaging of the abdomen and pelvis was performed using the standard protocol following bolus administration of intravenous contrast. CONTRAST:  75mL ISOVUE-300 IOPAMIDOL (ISOVUE-300) INJECTION 61% COMPARISON:  CT of the abdomen and pelvis 08/30/2013 FINDINGS: Lower chest: Small bilateral pleural effusions are present. There is associated atelectasis. Focal atelectasis is present in the lingula. No other focal nodule, mass, or airspace disease is present. The heart size is upper limits of normal. No significant pericardial effusion is present. Hepatobiliary: No focal hepatic lesions are present. Liver contour is within normal limits. The common bile duct is within normal limits. Multiple layering stones are again seen within the gallbladder. No inflammatory changes are present to suggest cholecystitis. Pancreas: No significant inflammatory changes are present. No solid or cystic mass lesion is present. There is no significant ductal dilation. Spleen: Within normal limits Adrenals/Urinary Tract: The adrenal glands are normal bilaterally. The kidneys are unremarkable. There are no renal calculi. There is no nephrolithiasis or ureteral obstruction. The urinary bladder is within normal limits. Stomach/Bowel: Stomach and duodenum are within normal limits. The small bowel is unremarkable. The appendix is visualized and normal. The ascending and transverse colon is within normal limits. Diverticular changes are present in the descending and sigmoid colon without focal inflammation to suggest diverticulitis. Vascular/Lymphatic: Minimal atherosclerotic calcifications are present in the aorta without aneurysm. No significant adenopathy is present. Reproductive: The uterus and adnexa  are within normal limits. Other: No significant free fluid or free air is present. Musculoskeletal: Rightward curvature of the lumbar spine is centered at L2-3. There is a vacuum disc at L2-3, L3-4,, L4-5, and L5-S1. Grade 1 anterolisthesis is present at L4-5 and L5-S1. Asymmetric endplate sclerotic changes are noted on the left at L2-3. Vertebral body heights are maintained. The bony pelvis is intact. Degenerative changes are noted at both hips, right greater than left. IMPRESSION: 1. No acute or focal lesion to explain the patient's symptoms. 2. Small bilateral pleural effusions and associated atelectasis. 3. Cholelithiasis without evidence for cholecystitis. 4. Sigmoid diverticulosis without diverticulitis. 5. Atherosclerosis. Electronically Signed: By: Marin Roberts M.D. On: 01/10/2016 17:58   Mr Roe Coombs W/wo Cm/mrcp  Result Date: 01/11/2016 CLINICAL DATA:  Hyperbilirubinemia. Dysphagia. Belching. Weight loss. EXAM: MRI ABDOMEN WITHOUT AND WITH CONTRAST (INCLUDING MRCP) TECHNIQUE: Multiplanar multisequence MR imaging of the abdomen was performed both before and after the administration of intravenous contrast. Heavily T2-weighted images of the biliary and pancreatic ducts were obtained, and three-dimensional MRCP images were rendered by post processing. CONTRAST:  20mL MULTIHANCE GADOBENATE DIMEGLUMINE 529 MG/ML IV SOLN COMPARISON:  01/10/2016 FINDINGS: Despite efforts by the technologist and patient, motion artifact is present on today's exam and could not be eliminated. The severely degraded images reduce exam sensitivity and specificity. Lower chest: Small bilateral pleural effusions with passive atelectasis. Mild cardiomegaly. Hepatobiliary: Features blurred or obscured by motion artifact on many sequences. Layering gallstones in the gallbladder. Irregular and somewhat thickened gallbladder wall suspected. There is probably a layer of sludge in the gallbladder. No biliary dilatation. No  filling defect  in the common bile duct or common hepatic duct. I do not see an obvious liver lesion, but sensitivity is dramatically reduced by the severe degree of motion artifact on the postcontrast images, causing a zebra-stripe pattern across the liver parenchyma which is severe. The patient was unable to breath hold on today's exam. Pancreas: 6 by 5 mm T2 hyperintense lesion medially in the pancreatic head on image 16/6, probably previously present and of similar size on 08/30/2013. Spleen: Unremarkable Adrenals/Urinary Tract: Unremarkable Stomach/Bowel: Unremarkable Vascular/Lymphatic: Unremarkable Other: No supplemental non-categorized findings. Musculoskeletal: Lumbar spondylosis. IMPRESSION: 1. Layering gallstones in the gallbladder with gallbladder wall thickening which is somewhat irregular, and likely a layer of sludge in the gallbladder. Findings are obscured by severe motion artifact. This could represent chronic cholecystitis, but could also be due to the patient's hypoproteinemia. Gallbladder mass is considered less likely but not totally excluded, particularly along the fundus. 2. Small bilateral pleural effusions with passive atelectasis. 3. Mild cardiomegaly. 4. Lumbar spondylosis. 5. Tiny fluid signal intensity lesion medially in the pancreatic head, about 5 mm diameter, probably stable from 08/30/2013 and likely clinically inconsequential. 6. Images on today's exam are severely degraded by motion artifact, reducing diagnostic sensitivity and specificity. The patient was unable to breath hold. Electronically Signed   By: Gaylyn Rong M.D.   On: 01/11/2016 12:26   US Abdomen Limited Ruq  Result Date: 01/10/2016 CLINICAL DATA:  Initial evaluation for acute abdominal pain for 2 months. History of cholelithiasis. EXAM: US ABDOMEN LIMITED - RIGHT UPPER QUADRANT COMPARISON:  Prior CT from 08/30/2016. FINDINGS: Gallbladder: Multiple calculi present within the gallbladder lumen, largest of which measured  approximately 1 cm. Gallbladder wall thickened up to 3.7 mm. Small amount of adjacent free fluid within the abdomen. No sonographic Murphy sign elicited on exam. Common bile duct: Diameter: 6.8 mm Liver: No focal lesion identified. Within normal limits in parenchymal echogenicity. No made of a right pleural effusion. IMPRESSION: 1. Cholelithiasis with mild gallbladder wall thickening. Small amount of adjacent free fluid within the abdomen. Unclear whether the free fluid is related to intrinsic gallbladder pathology or overall volume status. Correlation with physical exam and laboratory values for possible acute cholecystitis recommended. No biliary dilatation. 2. Small right pleural effusion. Electronically Signed   By: Rise Mu M.D.   On: 01/10/2016 15:35   ASSESSMENT AND PLAN:  Tiphani Villasana  is a 82 y.o. female with a known history of Hypertension, diabetes and kidney stone  * Subacute Cholelithiasis. Follow-up with Dr.Ely for possible surgery. The patient has low to moderate risk for surgery.  *Hypokalemia. Give potassium supplement, follow-up BMP and magnesium level.  *Abnormal liver function test. With Obstructive jaundice -with rising bilirubin pt underwent ERCP that showed biliary sludge. Stent x1 was placed  *Hypertension, controlled Continue home meds Prn hydralazine  *Diabetes. Controlled, hold glimepiride and start sliding scale.  Case discussed with Care Management/Social Worker. Management plans discussed with the patient, family and they are in agreement.  CODE STATUS: full  DVT Prophylaxis: lovenox  TOTAL TIME TAKING CARE OF THIS PATIENT:25 minutes.  >50% time spent on counselling and coordination of care  POSSIBLE D/C IN 2-3 DAYS, DEPENDING ON CLINICAL CONDITION.  Note: This dictation was prepared with Dragon dictation along with smaller phrase technology. Any transcriptional errors that result from this process are unintentional.  Nieves Barberi M.D on  01/14/2016 at 4:34 PM  Between 7am to 6pm - Pager - [phone]  After 6pm go to [email or web address] - password [email or web address]  [facility]  [facility]  Office  [phone]  CC: Primary care physician; Dorothey Baseman, MD

## 2016-01-14 NOTE — Plan of Care (Signed)
Problem: Physical Regulation: Goal: Will remain free from infection Outcome: Progressing Pt receiving antibiotics  Problem: Activity: Goal: Risk for activity intolerance will decrease Outcome: Progressing Pt ambulating to the BR  Problem: Bowel/Gastric: Goal: Will not experience complications related to bowel motility Outcome: Progressing Pt had BM today

## 2016-01-14 NOTE — Care Management Important Message (Signed)
Important Message  Patient Details  Name: Victoria Dalton MRN: [id number] Date of Birth: 27-Mar-1931   Medicare Important Message Given:  Yes    Katrina Stack, RN 01/14/2016, 1:44 PM

## 2016-01-14 NOTE — Progress Notes (Addendum)
CC: sympt cholelithiaisis Subjective: ERCP w/o stones or obstruction She feels better, taking clears, no N/V  Objective: Vital signs in last 24 hours: Temp:  [97.8 F (36.6 C)-98 F (36.7 C)] 97.8 F (36.6 C) (08/21 1335) Pulse Rate:  [57-60] 60 (08/21 1335) Resp:  [19-22] 22 (08/21 1335) BP: (114-146)/(36-62) 117/36 (08/21 1335) SpO2:  [94 %-97 %] 97 % (08/21 1335) Last BM Date: 01/13/16  Intake/Output from previous day: 08/20 0701 - 08/21 0700 In: 4100.1 [P.O.:1290; I.V.:2366.3; IV Piggyback:443.8] Out: 3350 [Urine:3350] Intake/Output this shift: Total I/O In: 1368.6 [P.O.:960; I.V.:313.7; IV Piggyback:94.9] Out: 1950 [Urine:1950]  Physical exam: Elderly female in NAD, awake alert Abd: soft, NT, no peritontiis Ext: well perfused, warm to touch  Lab Results: CBC   Recent Labs  01/13/16 0625 01/14/16 0618  WBC 9.9 8.1  HGB 12.5 11.5*  HCT 37.0 33.8*  PLT 234 197   BMET  Recent Labs  01/13/16 0556 01/14/16 0618  NA 133* 136  K 3.9 4.0  CL 100* 103  CO2 24 27  GLUCOSE 156* 121*  BUN 7 9  CREATININE 0.85 0.97  CALCIUM 8.5* 8.4*   PT/INR No results for input(s): LABPROT, INR in the last 72 hours. ABG No results for input(s): PHART, HCO3 in the last 72 hours.  Invalid input(s): PCO2, PO2  Studies/Results: No results found.  Anti-infectives: Anti-infectives    Start     Dose/Rate Route Frequency Ordered Stop   01/10/16 1800  ciprofloxacin (CIPRO) IVPB 400 mg     400 mg 200 mL/hr over 60 Minutes Intravenous Every 12 hours 01/10/16 1643     01/10/16 1730  metroNIDAZOLE (FLAGYL) IVPB 500 mg     500 mg 100 mL/hr over 60 Minutes Intravenous Every 8 hours 01/10/16 1643        Assessment/Plan: Symptomatic cholelithiasis. Increased bilirubin but now trending down. We'll check indirect and direct bilirubin. Discussed with her in detail about the nature of her disease. ERCP did not show any evidence of bile duct stones or cholangitis. There is still a   question about whether these might be related to her gallbladder, where show malignancy versus cholelithiasis.. Discussed with the patient due to she was to wait for now . We will continue current therapy, will heplock her fluids and advance her diet. No need for immediate surgical intervention. Caroleen Hamman, MD, [facility]  01/14/2016

## 2016-01-14 NOTE — Care Management (Signed)
Patient originally placed in observation but admission order obtained 01/13/2016.  Had ERCP 8/20 with stent.  Patient tearful regarding  her current health status.  "Three weeks ago I felt great and now I can not even get out of bed by myself."  Discussed during progression  of need to mobilize patient outside of her room. CM obtained order for physical therapy evaluation.  Patient prior to this illness was independent in all her adls.  No issues accessing medical care, obtaining medications or with transportation.

## 2016-01-15 ENCOUNTER — Ambulatory Visit: Admission: RE | Admit: 2016-01-15 | Payer: Medicare Other | Source: Ambulatory Visit

## 2016-01-15 LAB — GLUCOSE, CAPILLARY
Glucose-Capillary: 165 mg/dL — ABNORMAL HIGH (ref 65–99)
Glucose-Capillary: 213 mg/dL — ABNORMAL HIGH (ref 65–99)

## 2016-01-15 LAB — CBC
HCT: 34.7 % — ABNORMAL LOW (ref 35.0–47.0)
Hemoglobin: 11.7 g/dL — ABNORMAL LOW (ref 12.0–16.0)
MCH: 30.1 pg (ref 26.0–34.0)
MCHC: 33.7 g/dL (ref 32.0–36.0)
MCV: 89.2 fL (ref 80.0–100.0)
Platelets: 217 10*3/uL (ref 150–440)
RBC: 3.89 MIL/uL (ref 3.80–5.20)
RDW: 16.9 % — ABNORMAL HIGH (ref 11.5–14.5)
WBC: 8.1 10*3/uL (ref 3.6–11.0)

## 2016-01-15 LAB — COMPREHENSIVE METABOLIC PANEL
ALT: 78 U/L — ABNORMAL HIGH (ref 14–54)
AST: 62 U/L — ABNORMAL HIGH (ref 15–41)
Albumin: 3.1 g/dL — ABNORMAL LOW (ref 3.5–5.0)
Alkaline Phosphatase: 292 U/L — ABNORMAL HIGH (ref 38–126)
Anion gap: 7 (ref 5–15)
BUN: 10 mg/dL (ref 6–20)
CO2: 27 mmol/L (ref 22–32)
Calcium: 8.7 mg/dL — ABNORMAL LOW (ref 8.9–10.3)
Chloride: 104 mmol/L (ref 101–111)
Creatinine, Ser: 0.96 mg/dL (ref 0.44–1.00)
GFR calc Af Amer: >60 mL/min (ref >60)
GFR calc non Af Amer: 54 mL/min — ABNORMAL LOW (ref >60)
Glucose, Bld: 176 mg/dL — ABNORMAL HIGH (ref 65–99)
Potassium: 4 mmol/L (ref 3.5–5.1)
Sodium: 138 mmol/L (ref 135–145)
Total Bilirubin: 1.8 mg/dL — ABNORMAL HIGH (ref 0.3–1.2)
Total Protein: 6.3 g/dL — ABNORMAL LOW (ref 6.5–8.1)

## 2016-01-15 NOTE — Progress Notes (Signed)
Okay Per Dr. Dahlia Byes to discontinue Simvastatin to try be able to print AVS

## 2016-01-15 NOTE — Progress Notes (Signed)
Pt A and O x 4. VSS. Pt tolerating diet well. No complaints of pain or nausea. IV removed intact, prescriptions given. Pt voiced understanding of discharge instructions with no further questions. Pt discharged via wheelchair with nursing student.   

## 2016-01-15 NOTE — Evaluation (Signed)
Physical Therapy Evaluation Patient Details Name: Victoria Dalton MRN: [id number] DOB: [birth year] Today's Date: 01/15/2016   History of Present Illness  Pt is a 82 y.o. female  with a three-month history of abdominal pain. Pt diagnosed with subacute cholelithiasis with abnormal liver tests/rising bilirubin.  Pt is s/p ERCP with bile duct stent placement wtih asymptomatic cholelithiasis.   Clinical Impression  Pt alert, pleasant, and in good spirits and reports feeling much better this date.  Pt independent with bed mobility, transfers, and gait and was able to ambulate 100' with RW independently without SOB or instability. Pt and daughter agree that pt is currently at baseline functionally and pt requests no further PT services.  Pt's orders completed at this time and will reassess pt as requested pending a change in status.     Follow Up Recommendations No PT follow up    Equipment Recommendations       Recommendations for Other Services       Precautions / Restrictions Precautions Precautions: Fall Restrictions Weight Bearing Restrictions: No      Mobility  Bed Mobility Overal bed mobility: Independent                Transfers Overall transfer level: Modified independent Equipment used: Rolling walker (2 wheeled)                Ambulation/Gait Ambulation/Gait assistance: Modified independent (Device/Increase time) Ambulation Distance (Feet): 100 Feet Assistive device: Rolling walker (2 wheeled) Gait Pattern/deviations: WFL(Within Functional Limits)        Stairs            Wheelchair Mobility    Modified Rankin (Stroke Patients Only)       Balance Overall balance assessment: No apparent balance deficits (not formally assessed)                                           Pertinent Vitals/Pain Pain Assessment: No/denies pain    Home Living Family/patient expects to be discharged to:: Private residence Living  Arrangements: Alone Available Help at Discharge: Family Type of Home: House Home Access: Ramped entrance     Home Layout: One level Home Equipment: Other (comment) (rollator)      Prior Function Level of Independence: Independent               Hand Dominance        Extremity/Trunk Assessment   Upper Extremity Assessment: Overall WFL for tasks assessed           Lower Extremity Assessment: Overall WFL for tasks assessed         Communication   Communication: No difficulties  Cognition Arousal/Alertness: Awake/alert Behavior During Therapy: WFL for tasks assessed/performed Overall Cognitive Status: Within Functional Limits for tasks assessed                      General Comments      Exercises        Assessment/Plan    PT Assessment Patent does not need any further PT services  PT Diagnosis     PT Problem List    PT Treatment Interventions     PT Goals (Current goals can be found in the Care Plan section)      Frequency     Barriers to discharge        Co-evaluation  End of Session Equipment Utilized During Treatment: Gait belt Activity Tolerance: Patient tolerated treatment well Patient left: in bed;with call bell/phone within reach;with family/visitor present           Time: 1545-1610 PT Time Calculation (min) (ACUTE ONLY): 25 min   Charges:   PT Evaluation $PT Eval Low Complexity: 1 Procedure     PT G Codes:        DRoyetta Asal PT, DPT 01/15/16, 4:52 PM

## 2016-01-15 NOTE — Progress Notes (Signed)
[facility] [place] [facility]   PATIENT NAME: Victoria Dalton    MR#:  [id number]  DATE OF BIRTH:  1930/12/03  SUBJECTIVE:   Pt admitted with ongoing symptoms of nause,burping, indigestion and weight for 2 months Feels better today S/p CBD stent placement day 2 No abd pain REVIEW OF SYSTEMS:   Review of Systems  Constitutional: Positive for weight loss. Negative for chills and fever.  HENT: Negative for ear discharge, ear pain and nosebleeds.   Eyes: Negative for blurred vision, pain and discharge.  Respiratory: Negative for sputum production, shortness of breath, wheezing and stridor.   Cardiovascular: Negative for chest pain, palpitations, orthopnea and PND.  Gastrointestinal: Negative for abdominal pain, diarrhea and vomiting.  Genitourinary: Negative for frequency and urgency.  Musculoskeletal: Negative for back pain and joint pain.  Neurological: Positive for weakness. Negative for sensory change, speech change and focal weakness.  Psychiatric/Behavioral: Negative for depression and hallucinations. The patient is not nervous/anxious.    Tolerating Diet:yes  DRUG ALLERGIES:   Allergies  Allergen Reactions  . Ramipril Other (See Comments)    IRREGULAR HEART BEAT  . Penicillins Rash    Has patient had a PCN reaction causing immediate rash, facial/tongue/throat swelling, SOB or lightheadedness with hypotension: Yes Has patient had a PCN reaction causing severe rash involving mucus membranes or skin necrosis: Yes Has patient had a PCN reaction that required hospitalization No Has patient had a PCN reaction occurring within the last 10 years: No If all of the above answers are "NO", then may proceed with Cephalosporin use.   . Sulfa Antibiotics Rash    VITALS:  Blood pressure (!) 141/43, pulse 68, temperature 98 F (36.7 C), temperature source Oral, resp. rate 17, height 5\' 8"  (1.727 m), weight 243 lb (110.2 kg), SpO2 95 %.  PHYSICAL  EXAMINATION:   Physical Exam  GENERAL:  82 y.o.-year-old patient lying in the bed with no acute distress. obese EYES: Pupils equal, round, reactive to light and accommodation. No scleral icterus. Extraocular muscles intact.  HEENT: Head atraumatic, normocephalic. Oropharynx and nasopharynx clear.  NECK:  Supple, no jugular venous distention. No thyroid enlargement, no tenderness.  LUNGS: Normal breath sounds bilaterally, no wheezing, rales, rhonchi. No use of accessory muscles of respiration.  CARDIOVASCULAR: S1, S2 normal. No murmurs, rubs, or gallops.  ABDOMEN: Soft, nontender, nondistended. Bowel sounds present. No organomegaly or mass.  EXTREMITIES: No cyanosis, clubbing or edema b/l.    NEUROLOGIC: Cranial nerves II through XII are intact. No focal Motor or sensory deficits b/l.   PSYCHIATRIC:  patient is alert and oriented x 3.  SKIN: No obvious rash, lesion, or ulcer.   LABORATORY PANEL:  CBC  Recent Labs Lab 01/15/16 0647  WBC 8.1  HGB 11.7*  HCT 34.7*  PLT 217    Chemistries   Recent Labs Lab 01/15/16 0647  NA 138  K 4.0  CL 104  CO2 27  GLUCOSE 176*  BUN 10  CREATININE 0.96  CALCIUM 8.7*  AST 62*  ALT 78*  ALKPHOS 292*  BILITOT 1.8*   Cardiac Enzymes No results for input(s): TROPONINI in the last 168 hours. RADIOLOGY:  Ct Abdomen Pelvis W Contrast  Addendum Date: 01/10/2016   ADDENDUM REPORT: 01/10/2016 21:03 ADDENDUM: Gallbladder wall thickening is more impressive on the coronal images. Stones are seen at the neck of the gallbladder. The common bile duct measures up to 11 mm, increased from the prior study. As suggested by ultrasound, this may represent  acute cholecystitis. At this age, gallbladder carcinoma is also considered. These results were called by telephone at the time of interpretation on 01/10/2016 at 8:59 pm to Dr. Tonita Cong , who verbally acknowledged these results. Electronically Signed   By: Marin Roberts M.D.   On: 01/10/2016 21:03    Result Date: 01/10/2016 CLINICAL DATA:  Epigastric abdominal pain. Nausea and burping. Symptoms for 2 days. Elevated temperature of 100.7 degrees. EXAM: CT ABDOMEN AND PELVIS WITH CONTRAST TECHNIQUE: Multidetector CT imaging of the abdomen and pelvis was performed using the standard protocol following bolus administration of intravenous contrast. CONTRAST:  75mL ISOVUE-300 IOPAMIDOL (ISOVUE-300) INJECTION 61% COMPARISON:  CT of the abdomen and pelvis 08/30/2013 FINDINGS: Lower chest: Small bilateral pleural effusions are present. There is associated atelectasis. Focal atelectasis is present in the lingula. No other focal nodule, mass, or airspace disease is present. The heart size is upper limits of normal. No significant pericardial effusion is present. Hepatobiliary: No focal hepatic lesions are present. Liver contour is within normal limits. The common bile duct is within normal limits. Multiple layering stones are again seen within the gallbladder. No inflammatory changes are present to suggest cholecystitis. Pancreas: No significant inflammatory changes are present. No solid or cystic mass lesion is present. There is no significant ductal dilation. Spleen: Within normal limits Adrenals/Urinary Tract: The adrenal glands are normal bilaterally. The kidneys are unremarkable. There are no renal calculi. There is no nephrolithiasis or ureteral obstruction. The urinary bladder is within normal limits. Stomach/Bowel: Stomach and duodenum are within normal limits. The small bowel is unremarkable. The appendix is visualized and normal. The ascending and transverse colon is within normal limits. Diverticular changes are present in the descending and sigmoid colon without focal inflammation to suggest diverticulitis. Vascular/Lymphatic: Minimal atherosclerotic calcifications are present in the aorta without aneurysm. No significant adenopathy is present. Reproductive: The uterus and adnexa are within normal limits.  Other: No significant free fluid or free air is present. Musculoskeletal: Rightward curvature of the lumbar spine is centered at L2-3. There is a vacuum disc at L2-3, L3-4,, L4-5, and L5-S1. Grade 1 anterolisthesis is present at L4-5 and L5-S1. Asymmetric endplate sclerotic changes are noted on the left at L2-3. Vertebral body heights are maintained. The bony pelvis is intact. Degenerative changes are noted at both hips, right greater than left. IMPRESSION: 1. No acute or focal lesion to explain the patient's symptoms. 2. Small bilateral pleural effusions and associated atelectasis. 3. Cholelithiasis without evidence for cholecystitis. 4. Sigmoid diverticulosis without diverticulitis. 5. Atherosclerosis. Electronically Signed: By: Marin Roberts M.D. On: 01/10/2016 17:58   Mr Roe Coombs W/wo Cm/mrcp  Result Date: 01/11/2016 CLINICAL DATA:  Hyperbilirubinemia. Dysphagia. Belching. Weight loss. EXAM: MRI ABDOMEN WITHOUT AND WITH CONTRAST (INCLUDING MRCP) TECHNIQUE: Multiplanar multisequence MR imaging of the abdomen was performed both before and after the administration of intravenous contrast. Heavily T2-weighted images of the biliary and pancreatic ducts were obtained, and three-dimensional MRCP images were rendered by post processing. CONTRAST:  20mL MULTIHANCE GADOBENATE DIMEGLUMINE 529 MG/ML IV SOLN COMPARISON:  01/10/2016 FINDINGS: Despite efforts by the technologist and patient, motion artifact is present on today's exam and could not be eliminated. The severely degraded images reduce exam sensitivity and specificity. Lower chest: Small bilateral pleural effusions with passive atelectasis. Mild cardiomegaly. Hepatobiliary: Features blurred or obscured by motion artifact on many sequences. Layering gallstones in the gallbladder. Irregular and somewhat thickened gallbladder wall suspected. There is probably a layer of sludge in the gallbladder. No biliary dilatation. No filling defect  in the common bile duct  or common hepatic duct. I do not see an obvious liver lesion, but sensitivity is dramatically reduced by the severe degree of motion artifact on the postcontrast images, causing a zebra-stripe pattern across the liver parenchyma which is severe. The patient was unable to breath hold on today's exam. Pancreas: 6 by 5 mm T2 hyperintense lesion medially in the pancreatic head on image 16/6, probably previously present and of similar size on 08/30/2013. Spleen: Unremarkable Adrenals/Urinary Tract: Unremarkable Stomach/Bowel: Unremarkable Vascular/Lymphatic: Unremarkable Other: No supplemental non-categorized findings. Musculoskeletal: Lumbar spondylosis. IMPRESSION: 1. Layering gallstones in the gallbladder with gallbladder wall thickening which is somewhat irregular, and likely a layer of sludge in the gallbladder. Findings are obscured by severe motion artifact. This could represent chronic cholecystitis, but could also be due to the patient's hypoproteinemia. Gallbladder mass is considered less likely but not totally excluded, particularly along the fundus. 2. Small bilateral pleural effusions with passive atelectasis. 3. Mild cardiomegaly. 4. Lumbar spondylosis. 5. Tiny fluid signal intensity lesion medially in the pancreatic head, about 5 mm diameter, probably stable from 08/30/2013 and likely clinically inconsequential. 6. Images on today's exam are severely degraded by motion artifact, reducing diagnostic sensitivity and specificity. The patient was unable to breath hold. Electronically Signed   By: Gaylyn Rong M.D.   On: 01/11/2016 12:26   US Abdomen Limited Ruq  Result Date: 01/10/2016 CLINICAL DATA:  Initial evaluation for acute abdominal pain for 2 months. History of cholelithiasis. EXAM: US ABDOMEN LIMITED - RIGHT UPPER QUADRANT COMPARISON:  Prior CT from 08/30/2016. FINDINGS: Gallbladder: Multiple calculi present within the gallbladder lumen, largest of which measured approximately 1 cm.  Gallbladder wall thickened up to 3.7 mm. Small amount of adjacent free fluid within the abdomen. No sonographic Murphy sign elicited on exam. Common bile duct: Diameter: 6.8 mm Liver: No focal lesion identified. Within normal limits in parenchymal echogenicity. No made of a right pleural effusion. IMPRESSION: 1. Cholelithiasis with mild gallbladder wall thickening. Small amount of adjacent free fluid within the abdomen. Unclear whether the free fluid is related to intrinsic gallbladder pathology or overall volume status. Correlation with physical exam and laboratory values for possible acute cholecystitis recommended. No biliary dilatation. 2. Small right pleural effusion. Electronically Signed   By: Rise Mu M.D.   On: 01/10/2016 15:35   ASSESSMENT AND PLAN:  Carinne Barina  is a 82 y.o. female with a known history of Hypertension, diabetes and kidney stone  * Subacute Cholelithiasis. -asymptomatic  *Abnormal liver function test. With Obstructive jaundice -with rising bilirubin pt underwent ERCP that showed biliary sludge. Stent x1 was placed -bilirubin down to 1.8 (was 5.5) -no surgical plans to remove GB for now per Dr Everlene Farrier  *Hypertension, controlled Continue home meds Prn hydralazine  *Diabetes. Resumed home meds and cont ssi  Medically stable for d/c Will sign off  Call if needed. D/w pt and family  Case discussed with Care Management/Social Worker. Management plans discussed with the patient, family and they are in agreement.  CODE STATUS: full  DVT Prophylaxis: lovenox  TOTAL TIME TAKING CARE OF THIS PATIENT:25 minutes.  >50% time spent on counselling and coordination of care   Note: This dictation was prepared with Dragon dictation along with smaller phrase technology. Any transcriptional errors that result from this process are unintentional.  Richardine Peppers M.D on 01/15/2016 at 12:29 PM  Between 7am to 6pm - Pager - [phone]  After 6pm go to  [email or web address] - password [email or web address] [facility]  [facility]  Office  [phone]  CC: Primary care physician; Dorothey Baseman, MD

## 2016-01-15 NOTE — Progress Notes (Signed)
Nutrition Follow-up  DOCUMENTATION CODES:   Severe malnutrition in context of acute illness/injury  INTERVENTION:  -Monitor intake and cater to pt preferences. Discussed small frequent meals with pt and family.  -Recommend adding boost BID between meals. Family will bring into pt as only have ensure and pt does not like.   NUTRITION DIAGNOSIS:   Malnutrition related to poor appetite, altered GI function as evidenced by percent weight loss, energy intake < or equal to 50% for > or equal to 5 days.    GOAL:   Patient will meet greater than or equal to 90% of their needs    MONITOR:   Diet advancement, Weight trends  REASON FOR ASSESSMENT:   Malnutrition Screening Tool    ASSESSMENT:     Pt s/p ERCP with CBD stent placement. No surgical plans to remove GB at this time  Pt reports tolerated ate least 2/3s of lunch today.   Medications reviewed: aspart, protonix Labs reviewed: glucose 176    Diet Order:  Diet Carb Modified Fluid consistency: Thin; Room service appropriate? Yes  Skin:  Reviewed, no issues  Last BM:  8/17  Height:   Ht Readings from Last 1 Encounters:  01/10/16 5\' 8"  (1.727 m)    Weight:   Wt Readings from Last 1 Encounters:  01/10/16 243 lb (110.2 kg)    Ideal Body Weight:     BMI:  Body mass index is 36.95 kg/m.  Estimated Nutritional Needs:   Kcal:  1600-2000 kcals/d  Protein:  80-100 g/d  Fluid:  1600-2058ml/d  EDUCATION NEEDS:   No education needs identified at this time  Achol Azpeitia B. Zenia Resides, [place], [place] (pager) Weekend/On-Call pager [phone])

## 2016-01-15 NOTE — Plan of Care (Signed)
Problem: Activity: Goal: Risk for activity intolerance will decrease Outcome: Progressing Pt walked to the bathroom this morning with stand by assist

## 2016-01-15 NOTE — Plan of Care (Signed)
Problem: Bowel/Gastric: Goal: Will not experience complications related to bowel motility Outcome: Progressing Pt passed a bowel movement this morning

## 2016-01-16 LAB — GLUCOSE, CAPILLARY: Glucose-Capillary: 137 mg/dL — ABNORMAL HIGH (ref 65–99)

## 2016-01-16 NOTE — Discharge Summary (Signed)
Patient ID: CHARELL PIEDRAHITA MRN: [id number] DOB/AGE: 07/17/1930 82 y.o.  Admit date: 01/10/2016 Discharge date: 01/16/2016   Discharge Diagnoses:  Active Problems:   Cholecystitis with cholelithiasis   Protein-calorie malnutrition, severe   Cholecystitis   Right upper quadrant pain   Hyperbilirubinemia   Cholangitis   Procedures:ERCP by Dr. Shary Key Course: This is an 82 year old female admitted I Dr. Pat Patrick for an abdominal pain workup revealed evidence of cholelithiasis and the patient became jaundiced suspected common bile duct obstruction. 40 workup including an MRCP that showed a potential artifact within the gallbladder towards deftly stones and her common bile duct was dilated. She also underwent an ERCP by [facility] without any evidence of obstructive lesions. The stent was placed. She did very well after the ERCP than did not develop any complications. She was On antibiotics which she responded very well. Her diet was advanced to clear liquid diet and regular diet and her bilirubin trended back to normal as well as her alkaline phosphatase. I her LFTs were also improving. Data a lengthy discussion with the family and with the patient about whether or not to perform a laparoscopic cholecystectomy during this admission. She was pain free when I examined her and found before attempting any cholecystectomy I wanted to delineate more her BUN anatomy to make sure this was not a gallbladder cancer. I'll see her back in the office in 2 weeks and I will repeat an ultrasound and I also will repeat an MRCP probably in the next few weeks this will give me a better idea about whether this beery disease was caused by gallstones or is in fact a more serious in nature such as cholangiocarcinoma that may need a tertiary referral. I did explain this to the patient in detail and as I stated above at the time of discharge she was asymptomatic with clinical improvement of  her condition and without any  evidence of cholangitis. I will see her back in the office in an a couple weeks and the antibiotics are to be discontinued. Her physical exam at the time of discharge she was awake alert and in no acute distress her abdomen was soft, nontender no evidence of peritonitis. And she was anicteric. Condition of the patient AT DC is stable  Consults: Dr. Allen Norris  Disposition: 01-Home or Self Care  Discharge Instructions    Call MD for:  difficulty breathing, headache or visual disturbances    Complete by:  As directed   Call MD for:  extreme fatigue    Complete by:  As directed   Call MD for:  hives    Complete by:  As directed   Call MD for:  persistant dizziness or light-headedness    Complete by:  As directed   Call MD for:  persistant nausea and vomiting    Complete by:  As directed   Call MD for:  redness, tenderness, or signs of infection (pain, swelling, redness, odor or green/yellow discharge around incision site)    Complete by:  As directed   Call MD for:  severe uncontrolled pain    Complete by:  As directed   Call MD for:  temperature >100.4    Complete by:  As directed   Diet - low sodium heart healthy    Complete by:  As directed   Increase activity slowly    Complete by:  As directed       Medication List    TAKE these medications   aspirin  81 MG chewable tablet Chew 81 mg by mouth daily.   glimepiride 2 MG tablet Commonly known as:  AMARYL Take 1 mg by mouth every morning.   lidocaine 2 % solution Commonly known as:  XYLOCAINE Use as directed 20 mLs in the mouth or throat as needed (For throat pain).   losartan-hydrochlorothiazide 100-25 MG tablet Commonly known as:  HYZAAR Take by mouth.   metoCLOPramide 10 MG tablet Commonly known as:  REGLAN Take 1 tablet (10 mg total) by mouth every 8 (eight) hours as needed for nausea or vomiting.   metoprolol succinate 100 MG 24 hr tablet Commonly known as:  TOPROL-XL Take by mouth.   ONE TOUCH ULTRA TEST test strip Generic  drug:  glucose blood   pantoprazole 40 MG tablet Commonly known as:  PROTONIX Take 1 tablet (40 mg total) by mouth daily.   PRESERVISION AREDS PO Take by mouth.   promethazine 25 MG tablet Commonly known as:  PHENERGAN take 1/2 tablet by mouth every 8 hours if needed for nausea or vomiting   simvastatin 40 MG tablet Commonly known as:  ZOCOR Take 40 mg by mouth at bedtime.   sitaGLIPtin 50 MG tablet Commonly known as:  JANUVIA Take 50 mg by mouth daily.      Follow-up Information    Jules Husbands, MD. Go on 02/04/2016.   Specialty:  General Surgery Why:  @10 :45am Contact information: [address] [place]  [address] [phone]            Caroleen Hamman, MD FACS

## 2016-01-17 ENCOUNTER — Ambulatory Visit: Payer: Medicare Other

## 2016-01-21 ENCOUNTER — Telehealth: Payer: Self-pay

## 2016-01-21 ENCOUNTER — Other Ambulatory Visit: Payer: Self-pay

## 2016-01-21 NOTE — Telephone Encounter (Signed)
Left vm letting pt know she needs to be scheduled for a ERCP stent removal. Advised pt this will be on a Tuesday in November.

## 2016-01-24 ENCOUNTER — Other Ambulatory Visit: Payer: Self-pay

## 2016-01-29 DIAGNOSIS — R1310 Dysphagia, unspecified: Secondary | ICD-10-CM | POA: Diagnosis not present

## 2016-01-29 DIAGNOSIS — Z23 Encounter for immunization: Secondary | ICD-10-CM | POA: Diagnosis not present

## 2016-01-29 DIAGNOSIS — Z882 Allergy status to sulfonamides status: Secondary | ICD-10-CM | POA: Diagnosis not present

## 2016-01-29 DIAGNOSIS — E119 Type 2 diabetes mellitus without complications: Secondary | ICD-10-CM | POA: Diagnosis not present

## 2016-01-29 DIAGNOSIS — R12 Heartburn: Secondary | ICD-10-CM | POA: Diagnosis not present

## 2016-02-04 ENCOUNTER — Encounter: Payer: Self-pay | Admitting: Surgery

## 2016-02-04 ENCOUNTER — Ambulatory Visit (INDEPENDENT_AMBULATORY_CARE_PROVIDER_SITE_OTHER): Payer: Medicare Other | Admitting: Surgery

## 2016-02-04 VITALS — BP 169/82 | HR 57 | Temp 98.2°F | Ht 68.0 in | Wt 235.0 lb

## 2016-02-04 DIAGNOSIS — K8020 Calculus of gallbladder without cholecystitis without obstruction: Secondary | ICD-10-CM

## 2016-02-04 NOTE — Progress Notes (Signed)
Ms. Victoria Dalton is an 82 year old female recently admitted to the hospital for, bile duct stones. She did have an ERCP but no evidence of stones were found, bile duct. Her symptoms improved and she was placed on IV antibiotics and nothing by mouth. An she was discharged home and previous studies including MRCP and ultrasound show evidence of cholelithiasis but unable to definitively exclude any gallbladder malignancy. The MRCP study was severely limited due to motion artifact. Clinically she has been doing well with no evidence of biliary obstruction. No fevers no chills and no abdominal pain.  ROS: full ROS performed and is otherwise negative  PE NAD CHEst: CTA, NSR, s1,s2 Abd: soft, NT, no peritonitis Ext: well perfused , no edema  A/p hx CBD stones and cholecystitis on octagenarian. We will repeat U/S to evaluate her biliary anatomy once again and r/o GB malignancy. We will see her again in a few weeks and at that time d/w her about possible chole vs observation. Extensive counseling provided. Time spent in the encounter was 25 minutes with more than 50% of the time spent in coordinating her care and counseling.

## 2016-02-04 NOTE — Patient Instructions (Addendum)
We have scheduled you for an Ultrasound of your Abdomen. This has been scheduled for 02/06/16 at 1015am. Arrive at 1000am at our [facility] off of [address].   We will see you back in office right after your Ultrasound. Please see appointment below.  Bring a list of medications with you to your appointment.  Nothing to eat or drink after midnight prior to your Ultrasound.   If you need to reschedule your Scan, you may do so by calling [phone].

## 2016-02-06 ENCOUNTER — Ambulatory Visit
Admission: RE | Admit: 2016-02-06 | Discharge: 2016-02-06 | Disposition: A | Discharge status: Home / Self Care | Payer: Medicare Other | Source: Ambulatory Visit | Attending: Surgery | Admitting: Surgery

## 2016-02-06 ENCOUNTER — Encounter: Payer: Self-pay | Admitting: Surgery

## 2016-02-06 ENCOUNTER — Ambulatory Visit (INDEPENDENT_AMBULATORY_CARE_PROVIDER_SITE_OTHER): Payer: Medicare Other | Admitting: Surgery

## 2016-02-06 VITALS — BP 147/77 | HR 59 | Temp 98.1°F | Ht 69.0 in | Wt 228.0 lb

## 2016-02-06 DIAGNOSIS — K8020 Calculus of gallbladder without cholecystitis without obstruction: Secondary | ICD-10-CM

## 2016-02-06 NOTE — Patient Instructions (Signed)
We will Schedule your surgery to be done on 02/19/2016. If this date does not work, once [employer] gives you a call to confirm the date please let her know.  Please refer to your blue sheet if you have any questions.

## 2016-02-06 NOTE — Progress Notes (Signed)
Outpatient Surgical Follow Up  02/06/2016  Victoria Dalton is an 82 y.o. female.   Chief Complaint  Patient presents with  . Follow-up    Acute Cholecystitis ([place] scheduled 9/13)    HPI: Ms. Victoria Dalton is an 82 year old female recently admitted to the hospital for, bile duct stones. She did have an ERCP but no evidence of stones were found, a stent was placed. Her symptoms improved and she was placed on IV antibiotics and nothing by mouth.  she was discharged home and previous studies including MRCP and ultrasound showed evidence of cholelithiasis but unable to definitively exclude any gallbladder malignancy. The MRCP study was severely limited due to motion artifact. Clinically she has been doing well with no evidence of biliary obstruction. No fevers no chills and no abdominal pain. I have obtained a more recent U/S showing no evidence of GB mass or cancer, some persistent GS and mild GB thickening. NML CBD ( improvement when comprared to previous studies. Currently no evidence of cholangitis or any abdominal pain.     Past Medical History:  Diagnosis Date  . Diabetes mellitus without complication ([place])   . Hypertension   . Kidney stone   . UTI (lower urinary tract infection)     Past Surgical History:  Procedure Laterality Date  . ERCP N/A 01/13/2016   Procedure: ENDOSCOPIC RETROGRADE CHOLANGIOPANCREATOGRAPHY (ERCP);  Surgeon: Lucilla Lame, MD;  Location: [facility] ENDOSCOPY;  Service: Endoscopy;  Laterality: N/A;  . KNEE ARTHROSCOPY Left   . TONSILLECTOMY      Family History  Problem Relation Age of Onset  . Cancer Father   . Heart attack Father     Social History:  reports that she has never smoked. She has never used smokeless tobacco. She reports that she does not drink alcohol or use drugs.  Allergies:  Allergies  Allergen Reactions  . Ramipril Other (See Comments)    IRREGULAR HEART BEAT  . Penicillins Rash    Has patient had a PCN reaction causing immediate rash,  facial/tongue/throat swelling, SOB or lightheadedness with hypotension: Yes Has patient had a PCN reaction causing severe rash involving mucus membranes or skin necrosis: Yes Has patient had a PCN reaction that required hospitalization No Has patient had a PCN reaction occurring within the last 10 years: No If all of the above answers are "NO", then may proceed with Cephalosporin use.   . Sulfa Antibiotics Rash    Medications reviewed.    ROS  ROS: full ROS performed and is otherwise negative other than what is stated on the HPI    BP (!) 147/77   Pulse (!) 59   Temp 98.1 F (36.7 C) (Oral)   Ht 5\' 9"  (1.753 m)   Wt 103.4 kg (228 lb)   BMI 33.67 kg/m   Physical Exam  Constitutional: She is oriented to person, place, and time and well-developed, well-nourished, and in no distress. No distress.  Eyes: Right eye exhibits no discharge. Left eye exhibits no discharge. No scleral icterus.  Neck: Neck supple. No JVD present. No tracheal deviation present. No thyromegaly present.  Cardiovascular: Normal rate.  Exam reveals no friction rub.   Pulmonary/Chest: Effort normal and breath sounds normal. No respiratory distress.  Abdominal: Soft. She exhibits no distension and no mass. There is no tenderness. There is no rebound and no guarding.  Musculoskeletal: Normal range of motion. She exhibits no deformity.  Neurological: She is alert and oriented to person, place, and time. Gait normal.  Skin: Skin  is warm and dry.  Psychiatric: Mood, memory, affect and judgment normal.  Nursing note and vitals reviewed.     No results found for this or any previous visit (from the past 48 hour(s)). US Abdomen Limited Ruq  Result Date: 02/06/2016 CLINICAL DATA:  No cholecystitis. EXAM: US ABDOMEN LIMITED - RIGHT UPPER QUADRANT COMPARISON:  MRI and CT 18 2017 FINDINGS: Gallbladder: Small gallstones, the largest 7 mm. Gallbladder wall thickening, measuring 4 mm. Negative sonographic Murphy's.  Common bile duct: Diameter: Normal caliber, 4 mm Liver: No focal lesion identified. Within normal limits in parenchymal echogenicity. IMPRESSION: Cholelithiasis. Mild gallbladder wall thickening without sonographic Murphy sign. These findings could be reflective of chronic cholecystitis. Electronically Signed   By: Rolm Baptise M.D.   On: 02/06/2016 11:09    Assessment/Plan: Sympt cholelithiasis and a previous episode of CBD stones requiring ERCP/ I have personally reviewed all her images studies and d/w them with Dr. Ardeen Garland from radiology. We agree that GB malignancy would be extremely rare. D/W the pt in detail about results and options given of referral to tertiary center vs attempting a lap chole . She wishes to have her GB removed. I discussed the procedure in detail.  The patient was given Neurosurgeon.  We discussed the risks and benefits of a laparoscopic cholecystectomy and possible cholangiogram including, but not limited to bleeding, infection, injury to surrounding structures such as the intestine or liver, bile leak, retained gallstones, need to convert to an open procedure, prolonged diarrhea, blood clots such as  DVT, common bile duct injury, anesthesia risks, and possible need for additional procedures.  The likelihood of improvement in symptoms and return to the patient's normal status is good. We discussed the typical post-operative recovery course. I also d/w her specifically that if we found some concerns for malignancy I would probably do a biopsy and terminate the case. She understands. Extensive counseling provided. I have spent at least 40 min in this encounter with the majority of time spent in coordination of care and counseling. Caroleen Hamman, MD FACS General Surgeon  02/06/2016,12:19 PM

## 2016-02-07 ENCOUNTER — Telehealth: Payer: Self-pay | Admitting: Surgery

## 2016-02-07 NOTE — Telephone Encounter (Addendum)
Pt advised of pre op date/time and sx date. Sx: 02/28/16  With Dr Pabon--Laparoscopic cholecystectomy.  Pre op: 02/12/16 @ 11:15am--Office.   Patient made aware to call [phone], between 1-3:00pm the day before surgery, to find out what time to arrive.

## 2016-02-12 ENCOUNTER — Encounter
Admission: RE | Admit: 2016-02-12 | Discharge: 2016-02-12 | Disposition: A | Discharge status: Home / Self Care | Payer: Medicare Other | Source: Ambulatory Visit | Attending: Surgery | Admitting: Surgery

## 2016-02-12 HISTORY — DX: Hyperlipidemia, unspecified: E78.5

## 2016-02-12 NOTE — Patient Instructions (Addendum)
  Your procedure is scheduled on: February 28, 2016 Report to Same Day Surgery 2nd floor medical mall To find out your arrival time please call [phone] between 1PM - 3PM on February 27, 2016  Remember: Instructions that are not followed completely may result in serious medical risk, up to and including death, or upon the discretion of your surgeon and anesthesiologist your surgery may need to be rescheduled.    _x___ 1. Do not eat food or drink liquids after midnight. No gum chewing or hard candies.     __x__ 2. No Alcohol for 24 hours before or after surgery.   __x__3. No Smoking for 24 prior to surgery.   ____  4. Bring all medications with you on the day of surgery if instructed.    __x__ 5. Notify your doctor if there is any change in your medical condition     (cold, fever, infections).     Do not wear jewelry, make-up, hairpins, clips or nail polish.  Do not wear lotions, powders, or perfumes. You may wear deodorant.  Do not shave 48 hours prior to surgery. Men may shave face and neck.  Do not bring valuables to the hospital.    [facility] is not responsible for any belongings or valuables.               Contacts, dentures or bridgework may not be worn into surgery.  Leave your suitcase in the car. After surgery it may be brought to your room.  For patients admitted to the hospital, discharge time is determined by your treatment team.   Patients discharged the day of surgery will not be allowed to drive home.    Please read over the following fact sheets that you were given:   [facility] Preparing for Surgery   _x___ Take these medicines the morning of surgery with A SIP OF WATER:    1. losartan  2. metoprolol  3. pantoprazole  4.  5.  6.  ____ Fleet Enema (as directed)   _x___ Use CHG Soap or sage wipes as directed on instruction sheet   ____ Use inhalers on the day of surgery and bring to hospital day of surgery  ____ Stop metformin 2 days prior to  surgery    ____ Take 1/2 of usual insulin dose the night before surgery and none on the morning of  surgery.   __X__ Stop aspirin or coumadin, or plavix 7 days prior to surgery  _X__ Stop Anti-inflammatories such as Advil, Aleve, Ibuprofen, Motrin, Naproxen,          Naprosyn, Goodies powders or aspirin products. Ok to take Tylenol.   __X__ Stop supplements 1 week prior to surgery.    ____ Bring C-Pap to the hospital.

## 2016-02-28 ENCOUNTER — Encounter
Admission: RE | Disposition: A | Discharge status: Home / Self Care | Payer: Self-pay | Source: Ambulatory Visit | Attending: Surgery

## 2016-02-28 ENCOUNTER — Ambulatory Visit: Payer: Medicare Other | Admitting: Anesthesiology

## 2016-02-28 ENCOUNTER — Ambulatory Visit
Admission: RE | Admit: 2016-02-28 | Discharge: 2016-02-28 | Disposition: A | Discharge status: Home / Self Care | Payer: Medicare Other | Source: Ambulatory Visit | Attending: Surgery | Admitting: Surgery

## 2016-02-28 ENCOUNTER — Encounter: Payer: Self-pay | Admitting: *Deleted

## 2016-02-28 DIAGNOSIS — I10 Essential (primary) hypertension: Secondary | ICD-10-CM | POA: Insufficient documentation

## 2016-02-28 DIAGNOSIS — K828 Other specified diseases of gallbladder: Secondary | ICD-10-CM | POA: Diagnosis not present

## 2016-02-28 DIAGNOSIS — E119 Type 2 diabetes mellitus without complications: Secondary | ICD-10-CM | POA: Diagnosis not present

## 2016-02-28 DIAGNOSIS — K8070 Calculus of gallbladder and bile duct without cholecystitis without obstruction: Secondary | ICD-10-CM | POA: Diagnosis not present

## 2016-02-28 DIAGNOSIS — K219 Gastro-esophageal reflux disease without esophagitis: Secondary | ICD-10-CM | POA: Diagnosis not present

## 2016-02-28 DIAGNOSIS — K8020 Calculus of gallbladder without cholecystitis without obstruction: Secondary | ICD-10-CM | POA: Diagnosis not present

## 2016-02-28 DIAGNOSIS — K819 Cholecystitis, unspecified: Secondary | ICD-10-CM | POA: Diagnosis not present

## 2016-02-28 DIAGNOSIS — K660 Peritoneal adhesions (postprocedural) (postinfection): Secondary | ICD-10-CM | POA: Diagnosis not present

## 2016-02-28 HISTORY — PX: LAPAROSCOPY: SHX197

## 2016-02-28 HISTORY — PX: DIAGNOSTIC LAPAROSCOPIC LIVER BIOPSY: SHX5797

## 2016-02-28 LAB — GLUCOSE, CAPILLARY
Glucose-Capillary: 139 mg/dL — ABNORMAL HIGH (ref 65–99)
Glucose-Capillary: 141 mg/dL — ABNORMAL HIGH (ref 65–99)

## 2016-02-28 SURGERY — LAPAROSCOPY, DIAGNOSTIC
Anesthesia: General | Wound class: Clean Contaminated

## 2016-02-28 MED ORDER — LIDOCAINE HCL (CARDIAC) 20 MG/ML IV SOLN
INTRAVENOUS | Status: DC | PRN
  Administered 2016-02-28: 60 mg via INTRAVENOUS

## 2016-02-28 MED ORDER — ROCURONIUM BROMIDE 100 MG/10ML IV SOLN
INTRAVENOUS | Status: DC | PRN
  Administered 2016-02-28: 5 mg via INTRAVENOUS
  Administered 2016-02-28: 30 mg via INTRAVENOUS

## 2016-02-28 MED ORDER — BUPIVACAINE-EPINEPHRINE 0.25% -1:200000 IJ SOLN
INTRAMUSCULAR | Status: DC | PRN
  Administered 2016-02-28: 30 mL

## 2016-02-28 MED ORDER — ACETAMINOPHEN 10 MG/ML IV SOLN
INTRAVENOUS | Status: AC
  Filled 2016-02-28: qty 100

## 2016-02-28 MED ORDER — CHLORHEXIDINE GLUCONATE CLOTH 2 % EX PADS: 6.0000 | MEDICATED_PAD | Freq: Once | CUTANEOUS | Status: DC

## 2016-02-28 MED ORDER — ONDANSETRON HCL 4 MG/2ML IJ SOLN: 4.0000 mg | Freq: Once | INTRAMUSCULAR | Status: DC | PRN

## 2016-02-28 MED ORDER — PROPOFOL 10 MG/ML IV BOLUS
INTRAVENOUS | Status: DC | PRN
  Administered 2016-02-28: 90 mg via INTRAVENOUS

## 2016-02-28 MED ORDER — SUGAMMADEX SODIUM 200 MG/2ML IV SOLN
INTRAVENOUS | Status: DC | PRN
  Administered 2016-02-28: 204.2 mg via INTRAVENOUS

## 2016-02-28 MED ORDER — LACTATED RINGERS IV SOLN
INTRAVENOUS | Status: DC | PRN
  Administered 2016-02-28: 14:00:00 via INTRAVENOUS

## 2016-02-28 MED ORDER — SUCCINYLCHOLINE CHLORIDE 20 MG/ML IJ SOLN
INTRAMUSCULAR | Status: DC | PRN
  Administered 2016-02-28: 100 mg via INTRAVENOUS

## 2016-02-28 MED ORDER — FENTANYL CITRATE (PF) 100 MCG/2ML IJ SOLN: 25.0000 ug | INTRAMUSCULAR | Status: DC | PRN

## 2016-02-28 MED ORDER — METRONIDAZOLE IN NACL 5-0.79 MG/ML-% IV SOLN
500.0000 mg | INTRAVENOUS | Status: AC
  Administered 2016-02-28: 500 mg via INTRAVENOUS
  Filled 2016-02-28: qty 100

## 2016-02-28 MED ORDER — ONDANSETRON HCL 4 MG/2ML IJ SOLN
INTRAMUSCULAR | Status: DC | PRN
  Administered 2016-02-28: 4 mg via INTRAVENOUS

## 2016-02-28 MED ORDER — FENTANYL CITRATE (PF) 100 MCG/2ML IJ SOLN
INTRAMUSCULAR | Status: DC | PRN
  Administered 2016-02-28: 50 ug via INTRAVENOUS

## 2016-02-28 MED ORDER — EPHEDRINE SULFATE 50 MG/ML IJ SOLN
INTRAMUSCULAR | Status: DC | PRN
  Administered 2016-02-28: 10 mg via INTRAVENOUS

## 2016-02-28 MED ORDER — CIPROFLOXACIN IN D5W 400 MG/200ML IV SOLN
INTRAVENOUS | Status: AC
  Filled 2016-02-28: qty 200

## 2016-02-28 MED ORDER — SODIUM CHLORIDE 0.9 % IV SOLN
INTRAVENOUS | Status: DC
  Administered 2016-02-28: 12:00:00 via INTRAVENOUS

## 2016-02-28 MED ORDER — DEXAMETHASONE SODIUM PHOSPHATE 10 MG/ML IJ SOLN
INTRAMUSCULAR | Status: DC | PRN
  Administered 2016-02-28: 4 mg via INTRAVENOUS

## 2016-02-28 MED ORDER — CIPROFLOXACIN IN D5W 400 MG/200ML IV SOLN
400.0000 mg | INTRAVENOUS | Status: AC
  Administered 2016-02-28: 400 mg via INTRAVENOUS

## 2016-02-28 MED ORDER — HYDROCODONE-ACETAMINOPHEN 5-325 MG PO TABS: 1.0000 | ORAL_TABLET | Freq: Four times a day (QID) | ORAL | 0 refills | Status: DC | PRN

## 2016-02-28 MED ORDER — BUPIVACAINE-EPINEPHRINE (PF) 0.25% -1:200000 IJ SOLN
INTRAMUSCULAR | Status: AC
  Filled 2016-02-28: qty 30

## 2016-02-28 SURGICAL SUPPLY — 48 items
APPLICATOR COTTON TIP 6IN STRL (MISCELLANEOUS) ×4 IMPLANT
APPLIER CLIP 5 13 M/L LIGAMAX5 (MISCELLANEOUS) ×4
BLADE SURG 15 STRL LF DISP TIS (BLADE) ×2 IMPLANT
BLADE SURG 15 STRL SS (BLADE) ×2
CANISTER SUCT 1200ML W/VALVE (MISCELLANEOUS) ×4 IMPLANT
CHLORAPREP W/TINT 26ML (MISCELLANEOUS) ×4 IMPLANT
CHOLANGIOGRAM CATH TAUT (CATHETERS) IMPLANT
CLEANER CAUTERY TIP 5X5 PAD (MISCELLANEOUS) IMPLANT
CLIP APPLIE 5 13 M/L LIGAMAX5 (MISCELLANEOUS) ×2 IMPLANT
DECANTER SPIKE VIAL GLASS SM (MISCELLANEOUS) ×4 IMPLANT
DERMABOND ADVANCED (GAUZE/BANDAGES/DRESSINGS) ×2
DERMABOND ADVANCED .7 DNX12 (GAUZE/BANDAGES/DRESSINGS) ×2 IMPLANT
DEVICE TROCAR PUNCTURE CLOSURE (ENDOMECHANICALS) IMPLANT
DRAPE C-ARM XRAY 36X54 (DRAPES) IMPLANT
DRSG TELFA 3X8 NADH (GAUZE/BANDAGES/DRESSINGS) ×4 IMPLANT
ELECT REM PT RETURN 9FT ADLT (ELECTROSURGICAL) ×4
ELECTRODE REM PT RTRN 9FT ADLT (ELECTROSURGICAL) ×2 IMPLANT
ENDOPOUCH RETRIEVER 10 (MISCELLANEOUS) ×4 IMPLANT
GLOVE BIO SURGEON STRL SZ7 (GLOVE) ×20 IMPLANT
GOWN STRL REUS W/ TWL LRG LVL3 (GOWN DISPOSABLE) ×6 IMPLANT
GOWN STRL REUS W/TWL LRG LVL3 (GOWN DISPOSABLE) ×6
IRRIGATION STRYKERFLOW (MISCELLANEOUS) ×2 IMPLANT
IRRIGATOR STRYKERFLOW (MISCELLANEOUS) ×4
IV CATH ANGIO 12GX3 LT BLUE (NEEDLE) ×4 IMPLANT
IV NS 1000ML (IV SOLUTION) ×2
IV NS 1000ML BAXH (IV SOLUTION) ×2 IMPLANT
L-HOOK LAP DISP 36CM (ELECTROSURGICAL) ×8
LHOOK LAP DISP 36CM (ELECTROSURGICAL) ×4 IMPLANT
LIQUID BAND (GAUZE/BANDAGES/DRESSINGS) IMPLANT
NEEDLE HYPO 22GX1.5 SAFETY (NEEDLE) ×4 IMPLANT
PACK LAP CHOLECYSTECTOMY (MISCELLANEOUS) ×4 IMPLANT
PAD CLEANER CAUTERY TIP 5X5 (MISCELLANEOUS)
PENCIL ELECTRO HAND CTR (MISCELLANEOUS) ×4 IMPLANT
SCISSORS METZENBAUM CVD 33 (INSTRUMENTS) ×4 IMPLANT
SLEEVE ENDOPATH XCEL 5M (ENDOMECHANICALS) ×8 IMPLANT
SOL ANTI-FOG 6CC FOG-OUT (MISCELLANEOUS) ×2 IMPLANT
SOL FOG-OUT ANTI-FOG 6CC (MISCELLANEOUS) ×2
SPONGE LAP 18X18 5 PK (GAUZE/BANDAGES/DRESSINGS) IMPLANT
STOPCOCK 3 WAY  REPLAC (MISCELLANEOUS) IMPLANT
SUT ETHIBOND 0 MO6 C/R (SUTURE) IMPLANT
SUT MNCRL AB 4-0 PS2 18 (SUTURE) ×8 IMPLANT
SUT VIC AB 0 CT2 27 (SUTURE) IMPLANT
SUT VICRYL 0 AB UR-6 (SUTURE) ×4 IMPLANT
SYR 20CC LL (SYRINGE) IMPLANT
TROCAR XCEL BLUNT TIP 100MML (ENDOMECHANICALS) ×4 IMPLANT
TROCAR XCEL NON-BLD 5MMX100MML (ENDOMECHANICALS) ×4 IMPLANT
TUBING INSUFFLATOR HI FLOW (MISCELLANEOUS) ×4 IMPLANT
WATER STERILE IRR 1000ML POUR (IV SOLUTION) IMPLANT

## 2016-02-28 NOTE — Progress Notes (Signed)
Laparoscopic Cholecystectomy  Pre-operative Diagnosis: Symptomatic cholelithiasis  Post-operative Diagnosis: Gallbladder mass neoplastic vs Inflammatory  Procedure:1.  Laparoscopic Lysis of adhesions                     2 . Laparoscopic Liver biopsy, laparoscopic GB/ omental  biopsy  Surgeon: Caroleen Hamman, MD FACS  Anesthesia: Gen. with endotracheal tube  Findings: GB mass inflammatory vs neoplastic  Estimated Blood Loss: 10cc                 Specimens: Biopsy Liver and GB  Complications: none   Procedure Details  The patient was seen again in the Holding Room. The benefits, complications, treatment options, and expected outcomes were discussed with the patient. The risks of bleeding, infection, recurrence of symptoms, failure to resolve symptoms, bile duct damage, bile duct leak, retained common bile duct stone, bowel injury, any of which could require further surgery and/or ERCP, stent, or papillotomy were reviewed with the patient. The likelihood of improving the patient's symptoms with return to their baseline status is good.  The patient and/or family concurred with the proposed plan, giving informed consent.  The patient was taken to Operating Room, identified as RIELEY SNUFFER and the procedure verified as Laparoscopic Cholecystectomy.  A Time Out was held and the above information confirmed.  Prior to the induction of general anesthesia, antibiotic prophylaxis was administered. VTE prophylaxis was in place. General endotracheal anesthesia was then administered and tolerated well. After the induction, the abdomen was prepped with Chloraprep and draped in the sterile fashion. The patient was positioned in the supine position.  Local anesthetic  was injected into the skin near the umbilicus and an incision made. Cut down technique was used to enter the abdominal cavity and a Hasson trochar was placed after two vicryl stitches were anchored to the fascia. Pneumoperitoneum was then  created with CO2 and tolerated well without any adverse changes in the patient's vital signs.  Three 5-mm ports were placed in the right upper quadrant all under direct vision. All skin incisions  were infiltrated with a local anesthetic agent before making the incision and placing the trocars.   The patient was positioned  in reverse Trendelenburg, tilted slightly to the patient's left.  Significant inflammatory adhesions from the omentum all the way start to the gallbladder. There also significant adhesions from midline to the gallbladder wall. Doing laparoscopic adhesion with a combination of scissors and blunt dissection were able to take down some of the adhesions from the gallbladder to the duodenum. Some of the adhesions from the omentum to the gallbladder. However today is chronic inflammatory mass questionable neoplastic versus inflammatory in the gallbladder. There was really no good plane to proceed with cholecystectomy and given the fact that the chance of gallbladder cancer cyst I decided not to proceed with cholecystectomy but only to perform biopsies of both the liver on the gallbladder/omentum tissue. This was performed with electrocautery and also with biopsy forceps in the standard fashion. Electrocautery was used to obtain hemostasis. There is no evidence of metastatic disease or any Gross Liver involvement  Inspection of the right upper quadrant was performed. No bleeding, bile duct injury or leak, or bowel injury was noted. Pneumoperitoneum was released.  The periumbilical port site was closed with figure-of-eight 0 Vicryl sutures. 4-0 subcuticular Monocryl was used to close the skin. Dermabond was  applied.  The patient was then extubated and brought to the recovery room in stable condition. Sponge, lap, and  needle counts were correct at closure and at the conclusion of the case.               Caroleen Hamman, MD, FACS

## 2016-02-28 NOTE — Transfer of Care (Signed)
Immediate Anesthesia Transfer of Care Note  Patient: Victoria Dalton  Procedure(s) Performed: Procedure(s): Diagnotic laparoscopy with omental biopsy DIAGNOSTIC LAPAROSCOPIC LIVER BIOPSY  Patient Location: PACU  Anesthesia Type:General  Level of Consciousness: sedated  Airway & Oxygen Therapy: Patient connected to face mask oxygen  Post-op Assessment: Post -op Vital signs reviewed and stable  Post vital signs: stable  Last Vitals:  Vitals:   02/28/16 1128 02/28/16 1452  BP: (!) 147/83 (!) 157/62  Pulse:  60  Resp: 16 20  Temp: 36.6 C     Last Pain:  Vitals:   02/28/16 1128  TempSrc: Oral         Complications: No apparent anesthesia complications

## 2016-02-28 NOTE — Interval H&P Note (Signed)
History and Physical Interval Note:  02/28/2016 11:17 AM  [name]  has presented today for surgery, with the diagnosis of GS  The various methods of treatment have been discussed with the patient and family. After consideration of risks, benefits and other options for treatment, the patient has consented to  Procedure(s): LAPAROSCOPIC CHOLECYSTECTOMY (N/A) as a surgical intervention .  The patient's history has been reviewed, patient examined, no change in status, stable for surgery.  I have reviewed the patient's chart and labs.  Questions were answered to the patient's satisfaction.     Victoria Dalton

## 2016-02-28 NOTE — Anesthesia Preprocedure Evaluation (Signed)
Anesthesia Evaluation  Patient identified by MRN, date of birth, ID band Patient awake    Reviewed: Allergy & Precautions, NPO status , Patient's Chart, lab work & pertinent test results, reviewed documented beta blocker date and time   Airway Mallampati: III  TM Distance: <3 FB     Dental  (+) Chipped, Caps   Pulmonary neg pulmonary ROS,    Pulmonary exam normal        Cardiovascular hypertension, Pt. on medications and Pt. on home beta blockers Normal cardiovascular exam     Neuro/Psych negative neurological ROS     GI/Hepatic GERD  ,  Endo/Other  diabetes, Well Controlled, Type 2  Renal/GU stone     Musculoskeletal negative musculoskeletal ROS (+)   Abdominal Normal abdominal exam  (+)   Peds negative pediatric ROS (+)  Hematology negative hematology ROS (+)   Anesthesia Other Findings   Reproductive/Obstetrics                             Anesthesia Physical Anesthesia Plan  ASA: II  Anesthesia Plan: General   Post-op Pain Management:    Induction: Intravenous  Airway Management Planned: Oral ETT  Additional Equipment:   Intra-op Plan:   Post-operative Plan: Extubation in OR  Informed Consent: I have reviewed the patients History and Physical, chart, labs and discussed the procedure including the risks, benefits and alternatives for the proposed anesthesia with the patient or authorized representative who has indicated his/her understanding and acceptance.   Dental advisory given  Plan Discussed with: CRNA and Surgeon  Anesthesia Plan Comments:         Anesthesia Quick Evaluation

## 2016-02-28 NOTE — Anesthesia Postprocedure Evaluation (Signed)
Anesthesia Post Note  Patient: Victoria Dalton  Procedure(s) Performed: Procedure(s): Diagnotic laparoscopy with omental biopsy DIAGNOSTIC LAPAROSCOPIC LIVER BIOPSY  Patient location during evaluation: PACU Anesthesia Type: General Level of consciousness: awake and alert and oriented Pain management: pain level controlled Vital Signs Assessment: post-procedure vital signs reviewed and stable Respiratory status: spontaneous breathing Cardiovascular status: blood pressure returned to baseline Anesthetic complications: no    Last Vitals:  Vitals:   02/28/16 1450 02/28/16 1452  BP: (!) 157/62 (!) 157/62  Pulse: 64 60  Resp: 20 20  Temp: 36.8 C     Last Pain:  Vitals:   02/28/16 1128  TempSrc: Oral                 Hunter Bachar

## 2016-02-28 NOTE — Progress Notes (Signed)
chlorhexidine wash made patient skin itch. No skin reddness or patches noted. 2 cloth pre-op not done due to itching.

## 2016-02-28 NOTE — Anesthesia Procedure Notes (Signed)
Procedure Name: Intubation Date/Time: 02/28/2016 1:47 PM Performed by: [facility] Pre-anesthesia Checklist: Patient identified, Emergency Drugs available, Suction available and Patient being monitored Patient Re-evaluated:Patient Re-evaluated prior to inductionOxygen Delivery Method: Circle system utilized Preoxygenation: Pre-oxygenation with 100% oxygen Intubation Type: IV induction Ventilation: Mask ventilation without difficulty Laryngoscope Size: Mac and 3 Grade View: Grade I Tube type: Oral Tube size: 7.0 mm Number of attempts: 1 Airway Equipment and Method: Stylet Placement Confirmation: ETT inserted through vocal cords under direct vision,  positive ETCO2,  CO2 detector and breath sounds checked- equal and bilateral Secured at: 21 cm Tube secured with: Tape Dental Injury: Teeth and Oropharynx as per pre-operative assessment  Future Recommendations: Recommend- induction with short-acting agent, and alternative techniques readily available

## 2016-02-28 NOTE — H&P (View-Only) (Signed)
Outpatient Surgical Follow Up  02/06/2016  Victoria Dalton is an 82 y.o. female.   Chief Complaint  Patient presents with  . Follow-up    Acute Cholecystitis ([place] scheduled 9/13)    HPI: Ms. Maiorano is an 82 year old female recently admitted to the hospital for, bile duct stones. She did have an ERCP but no evidence of stones were found, a stent was placed. Her symptoms improved and she was placed on IV antibiotics and nothing by mouth.  she was discharged home and previous studies including MRCP and ultrasound showed evidence of cholelithiasis but unable to definitively exclude any gallbladder malignancy. The MRCP study was severely limited due to motion artifact. Clinically she has been doing well with no evidence of biliary obstruction. No fevers no chills and no abdominal pain. I have obtained a more recent U/S showing no evidence of GB mass or cancer, some persistent GS and mild GB thickening. NML CBD ( improvement when comprared to previous studies. Currently no evidence of cholangitis or any abdominal pain.     Past Medical History:  Diagnosis Date  . Diabetes mellitus without complication ([place])   . Hypertension   . Kidney stone   . UTI (lower urinary tract infection)     Past Surgical History:  Procedure Laterality Date  . ERCP N/A 01/13/2016   Procedure: ENDOSCOPIC RETROGRADE CHOLANGIOPANCREATOGRAPHY (ERCP);  Surgeon: Lucilla Lame, MD;  Location[facility] ENDOSCOPY;  Service: Endoscopy;  Laterality: N/A;  . KNEE ARTHROSCOPY Left   . TONSILLECTOMY      Family History  Problem Relation Age of Onset  . Cancer Father   . Heart attack Father     Social History:  reports that she has never smoked. She has never used smokeless tobacco. She reports that she does not drink alcohol or use drugs.  Allergies:  Allergies  Allergen Reactions  . Ramipril Other (See Comments)    IRREGULAR HEART BEAT  . Penicillins Rash    Has patient had a PCN reaction causing immediate rash,  facial/tongue/throat swelling, SOB or lightheadedness with hypotension: Yes Has patient had a PCN reaction causing severe rash involving mucus membranes or skin necrosis: Yes Has patient had a PCN reaction that required hospitalization No Has patient had a PCN reaction occurring within the last 10 years: No If all of the above answers are "NO", then may proceed with Cephalosporin use.   . Sulfa Antibiotics Rash    Medications reviewed.    ROS  ROS: full ROS performed and is otherwise negative other than what is stated on the HPI    BP (!) 147/77   Pulse (!) 59   Temp 98.1 F (36.7 C) (Oral)   Ht 5\' 9"  (1.753 m)   Wt 103.4 kg (228 lb)   BMI 33.67 kg/m   Physical Exam  Constitutional: She is oriented to person, place, and time and well-developed, well-nourished, and in no distress. No distress.  Eyes: Right eye exhibits no discharge. Left eye exhibits no discharge. No scleral icterus.  Neck: Neck supple. No JVD present. No tracheal deviation present. No thyromegaly present.  Cardiovascular: Normal rate.  Exam reveals no friction rub.   Pulmonary/Chest: Effort normal and breath sounds normal. No respiratory distress.  Abdominal: Soft. She exhibits no distension and no mass. There is no tenderness. There is no rebound and no guarding.  Musculoskeletal: Normal range of motion. She exhibits no deformity.  Neurological: She is alert and oriented to person, place, and time. Gait normal.  Skin: Skin  is warm and dry.  Psychiatric: Mood, memory, affect and judgment normal.  Nursing note and vitals reviewed.     No results found for this or any previous visit (from the past 48 hour(s)). US Abdomen Limited Ruq  Result Date: 02/06/2016 CLINICAL DATA:  No cholecystitis. EXAM: US ABDOMEN LIMITED - RIGHT UPPER QUADRANT COMPARISON:  MRI and CT 18 2017 FINDINGS: Gallbladder: Small gallstones, the largest 7 mm. Gallbladder wall thickening, measuring 4 mm. Negative sonographic Murphy's.  Common bile duct: Diameter: Normal caliber, 4 mm Liver: No focal lesion identified. Within normal limits in parenchymal echogenicity. IMPRESSION: Cholelithiasis. Mild gallbladder wall thickening without sonographic Murphy sign. These findings could be reflective of chronic cholecystitis. Electronically Signed   By: Rolm Baptise M.D.   On: 02/06/2016 11:09    Assessment/Plan: Sympt cholelithiasis and a previous episode of CBD stones requiring ERCP/ I have personally reviewed all her images studies and d/w them with Dr. Ardeen Garland from radiology. We agree that GB malignancy would be extremely rare. D/W the pt in detail about results and options given of referral to tertiary center vs attempting a lap chole . She wishes to have her GB removed. I discussed the procedure in detail.  The patient was given Neurosurgeon.  We discussed the risks and benefits of a laparoscopic cholecystectomy and possible cholangiogram including, but not limited to bleeding, infection, injury to surrounding structures such as the intestine or liver, bile leak, retained gallstones, need to convert to an open procedure, prolonged diarrhea, blood clots such as  DVT, common bile duct injury, anesthesia risks, and possible need for additional procedures.  The likelihood of improvement in symptoms and return to the patient's normal status is good. We discussed the typical post-operative recovery course. I also d/w her specifically that if we found some concerns for malignancy I would probably do a biopsy and terminate the case. She understands. Extensive counseling provided. I have spent at least 40 min in this encounter with the majority of time spent in coordination of care and counseling. Caroleen Hamman, MD FACS General Surgeon  02/06/2016,12:19 PM

## 2016-02-28 NOTE — Discharge Instructions (Signed)
AMBULATORY SURGERY  °DISCHARGE INSTRUCTIONS ° ° °1) The drugs that you were given will stay in your system until tomorrow so for the next 24 hours you should not: ° °A) Drive an automobile °B) Make any legal decisions °C) Drink any alcoholic beverage ° ° °2) You may resume regular meals tomorrow.  Today it is better to start with liquids and gradually work up to solid foods. ° °You may eat anything you prefer, but it is better to start with liquids, then soup and crackers, and gradually work up to solid foods. ° ° °3) Please notify your doctor immediately if you have any unusual bleeding, trouble breathing, redness and pain at the surgery site, drainage, fever, or pain not relieved by medication. ° ° ° °4) Additional Instructions: ° ° ° ° ° ° ° °Please contact your physician with any problems or Same Day Surgery at [phone], Monday through Friday 6 am to 4 pm, or [place] at [place] Main number at [phone]. °

## 2016-02-29 NOTE — Op Note (Signed)
Laparoscopic Cholecystectomy  Pre-operative Diagnosis: Symptomatic cholelithiasis  Post-operative Diagnosis: Gallbladder mass neoplastic vs Inflammatory  Procedure:1.  Laparoscopic Lysis of adhesions                     2 . Laparoscopic Liver biopsy, laparoscopic GB/ omental  biopsy  Surgeon: Caroleen Hamman, MD FACS  Anesthesia: Gen. with endotracheal tube  Findings: GB mass inflammatory vs neoplastic Unable to perform cholecystectomy because of concern of malignancy  Estimated Blood Loss: 10cc                 Specimens: Biopsy Liver and GB  Complications: none   Procedure Details  The patient was seen again in the Holding Room. The benefits, complications, treatment options, and expected outcomes were discussed with the patient. The risks of bleeding, infection, recurrence of symptoms, failure to resolve symptoms, bile duct damage, bile duct leak, retained common bile duct stone, bowel injury, any of which could require further surgery and/or ERCP, stent, or papillotomy were reviewed with the patient. The likelihood of improving the patient's symptoms with return to their baseline status is good.  The patient and/or family concurred with the proposed plan, giving informed consent.  The patient was taken to Operating Room, identified as Victoria Dalton and the procedure verified as Laparoscopic Cholecystectomy.  A Time Out was held and the above information confirmed.  Prior to the induction of general anesthesia, antibiotic prophylaxis was administered. VTE prophylaxis was in place. General endotracheal anesthesia was then administered and tolerated well. After the induction, the abdomen was prepped with Chloraprep and draped in the sterile fashion. The patient was positioned in the supine position.  Local anesthetic  was injected into the skin near the umbilicus and an incision made. Cut down technique was used to enter the abdominal cavity and a Hasson trochar was placed  after two vicryl stitches were anchored to the fascia. Pneumoperitoneum was then created with CO2 and tolerated well without any adverse changes in the patient's vital signs.  Three 5-mm ports were placed in the right upper quadrant all under direct vision. All skin incisions  were infiltrated with a local anesthetic agent before making the incision and placing the trocars.   The patient was positioned  in reverse Trendelenburg, tilted slightly to the patient's left.  Significant inflammatory adhesions from the omentum all the way start to the gallbladder. There also significant adhesions from midline to the gallbladder wall. Doing laparoscopic adhesion with a combination of scissors and blunt dissection were able to take down some of the adhesions from the gallbladder to the duodenum. Some of the adhesions from the omentum to the gallbladder. However today is chronic inflammatory mass questionable neoplastic versus inflammatory in the gallbladder. There was really no good plane to proceed with cholecystectomy and given the fact that the chance of gallbladder cancer cyst I decided not to proceed with cholecystectomy but only to perform biopsies of both the liver on the gallbladder/omentum tissue. This was performed with electrocautery and also with biopsy forceps in the standard fashion. Electrocautery was used to obtain hemostasis. There is no evidence of metastatic disease or any Gross Liver involvement  Inspection of the right upper quadrant was performed. No bleeding, bile duct injury or leak, or bowel injury was noted. Pneumoperitoneum was released.  The periumbilical port site was closed with figure-of-eight 0 Vicryl sutures. 4-0 subcuticular Monocryl was used to close the skin. Dermabond was  applied.  The patient was then extubated and brought to  the recovery room in stable condition. Sponge, lap, and needle counts were correct at closure and at the conclusion of the case.               Caroleen Hamman,  MD, FACS

## 2016-03-05 LAB — SURGICAL PATHOLOGY

## 2016-03-10 ENCOUNTER — Ambulatory Visit (INDEPENDENT_AMBULATORY_CARE_PROVIDER_SITE_OTHER): Payer: Medicare Other | Admitting: Surgery

## 2016-03-10 ENCOUNTER — Encounter: Payer: Self-pay | Admitting: Surgery

## 2016-03-10 VITALS — BP 142/67 | HR 59 | Temp 98.1°F | Wt 228.0 lb

## 2016-03-10 DIAGNOSIS — Z09 Encounter for follow-up examination after completed treatment for conditions other than malignant neoplasm: Secondary | ICD-10-CM

## 2016-03-10 DIAGNOSIS — K828 Other specified diseases of gallbladder: Secondary | ICD-10-CM

## 2016-03-10 MED ORDER — METRONIDAZOLE 500 MG PO TABS: 500.0000 mg | ORAL_TABLET | Freq: Three times a day (TID) | ORAL | 0 refills | Status: DC

## 2016-03-10 MED ORDER — CIPROFLOXACIN HCL 500 MG PO TABS: 500.0000 mg | ORAL_TABLET | Freq: Two times a day (BID) | ORAL | 0 refills | Status: DC

## 2016-03-10 NOTE — Progress Notes (Signed)
S/p dx lap with biopsies and LOA Doing well Biopsies failed to show any Cancer She is taking PO, afebrile  PE NAD Abd: soft, incisions healing well, no infection  A/p Chronic cholecystitis vs GB mass CT A/P to evaluate for mass ( last U/S failed to show mass but during operation it felt very hard) She will need an open chole but we will perform a CT A/P if GB cancer she will need to be referred to tertiary center for Radial cholecystectomy We will provide short course of cipro and flagyl Counseling provided

## 2016-03-10 NOTE — Patient Instructions (Addendum)
Please go and have your CT Scan. Then Dr. Dahlia Byes Would see you after.  CT Scan will be on 03/18/2016 at 10:00 AM but you will need to arrive at 9:45 AM. Nothing to eat or drink 4 hours prior. This will be at the [place] location. Please make sure to pick up your prep-kit before.

## 2016-03-17 ENCOUNTER — Telehealth: Payer: Self-pay | Admitting: Surgery

## 2016-03-17 NOTE — Telephone Encounter (Signed)
Victoria Dalton [phone], left a voice message for Cerinity Gadomski. She is taking pain medication for a abscess on her neck. Will it affect her CT scan tomorrow? Please call.

## 2016-03-17 NOTE — Telephone Encounter (Signed)
Patient called and is having A Ct scan tomorrow. However she has a tooth ache and wanted to know if taking the pain medicine that Dr.Pabon prescribed would interfere with the CT scan.  Patient was told no it would not, but do not have anything by mouth 4 hours prior to the CT scan.

## 2016-03-18 ENCOUNTER — Ambulatory Visit
Admission: RE | Admit: 2016-03-18 | Discharge: 2016-03-18 | Disposition: A | Discharge status: Home / Self Care | Payer: Medicare Other | Source: Ambulatory Visit | Attending: Surgery | Admitting: Surgery

## 2016-03-18 DIAGNOSIS — K828 Other specified diseases of gallbladder: Secondary | ICD-10-CM

## 2016-03-18 DIAGNOSIS — K8020 Calculus of gallbladder without cholecystitis without obstruction: Secondary | ICD-10-CM | POA: Diagnosis not present

## 2016-03-18 DIAGNOSIS — K829 Disease of gallbladder, unspecified: Secondary | ICD-10-CM | POA: Diagnosis present

## 2016-03-18 DIAGNOSIS — K449 Diaphragmatic hernia without obstruction or gangrene: Secondary | ICD-10-CM | POA: Diagnosis not present

## 2016-03-18 DIAGNOSIS — K838 Other specified diseases of biliary tract: Secondary | ICD-10-CM | POA: Diagnosis not present

## 2016-03-18 DIAGNOSIS — I700 Atherosclerosis of aorta: Secondary | ICD-10-CM | POA: Insufficient documentation

## 2016-03-18 DIAGNOSIS — K5730 Diverticulosis of large intestine without perforation or abscess without bleeding: Secondary | ICD-10-CM | POA: Diagnosis not present

## 2016-03-18 DIAGNOSIS — R933 Abnormal findings on diagnostic imaging of other parts of digestive tract: Secondary | ICD-10-CM | POA: Insufficient documentation

## 2016-03-18 LAB — POCT I-STAT CREATININE: Creatinine, Ser: 1.1 mg/dL — ABNORMAL HIGH (ref 0.44–1.00)

## 2016-03-18 MED ORDER — IOPAMIDOL (ISOVUE-300) INJECTION 61%
100.0000 mL | Freq: Once | INTRAVENOUS | Status: AC | PRN
  Administered 2016-03-18: 100 mL via INTRAVENOUS

## 2016-03-20 NOTE — Telephone Encounter (Signed)
Spoke with patient at this time and let her know the CT scan did not show cancer per Dr.Pabon. Patient stated she visited her dentist and he felt a swollen lymph node in her neck and referred her to her PCP, of which she will be seeing him tomorrow 03/21/16. She was reminded of her follow up appointment with Dr.Pabon on 04/02/16. Patient verbalized understanding.

## 2016-03-20 NOTE — Telephone Encounter (Signed)
LVM for patient to call office. Per Dr.Pabon please let patient know CT scan did not show cancer. She has a follow up appointment on 04/02/16.

## 2016-03-21 DIAGNOSIS — T360X5S Adverse effect of penicillins, sequela: Secondary | ICD-10-CM | POA: Diagnosis not present

## 2016-03-21 DIAGNOSIS — E119 Type 2 diabetes mellitus without complications: Secondary | ICD-10-CM | POA: Diagnosis not present

## 2016-03-21 DIAGNOSIS — E785 Hyperlipidemia, unspecified: Secondary | ICD-10-CM | POA: Diagnosis not present

## 2016-03-21 DIAGNOSIS — Z87448 Personal history of other diseases of urinary system: Secondary | ICD-10-CM | POA: Diagnosis not present

## 2016-03-28 DIAGNOSIS — Z882 Allergy status to sulfonamides status: Secondary | ICD-10-CM | POA: Diagnosis not present

## 2016-03-28 DIAGNOSIS — E119 Type 2 diabetes mellitus without complications: Secondary | ICD-10-CM | POA: Diagnosis not present

## 2016-03-28 DIAGNOSIS — N760 Acute vaginitis: Secondary | ICD-10-CM | POA: Diagnosis not present

## 2016-03-28 DIAGNOSIS — E785 Hyperlipidemia, unspecified: Secondary | ICD-10-CM | POA: Diagnosis not present

## 2016-04-02 ENCOUNTER — Encounter: Payer: Self-pay | Admitting: Surgery

## 2016-04-02 ENCOUNTER — Ambulatory Visit (INDEPENDENT_AMBULATORY_CARE_PROVIDER_SITE_OTHER): Payer: Medicare Other | Admitting: Surgery

## 2016-04-02 VITALS — BP 162/78 | HR 59 | Temp 98.8°F | Ht 69.0 in | Wt 226.4 lb

## 2016-04-02 DIAGNOSIS — Z09 Encounter for follow-up examination after completed treatment for conditions other than malignant neoplasm: Secondary | ICD-10-CM

## 2016-04-02 NOTE — Patient Instructions (Signed)
Continue with your ERCP and stent removal with Dr. Allen Norris on 04/08/16 as scheduled.  We will arrange your Gallbladder removal on 04/30/16 at [facility] with Dr. Dahlia Byes. Please see (blue) pre-care sheet for further information.  Call our office if you have any questions or concerns.    Open Cholecystectomy Open cholecystectomy is surgery to remove the gallbladder. The gallbladder is located in the upper right part of the abdomen, behind the liver. It is a storage sac for the bile produced in the liver. Bile aids in the digestion and absorption of fats. Cholecystectomy is often done for inflammation of the gallbladder (cholecystitis). This condition is usually caused by a buildup of gallstones (cholelithiasis) in your gallbladder. Gallstones can block the flow of bile, resulting in inflammation, pain, and possibly infection. In severe cases, emergency surgery may be required. When emergency surgery is not required, you will have time to prepare for the procedure. LET [facility] CARE PROVIDER KNOW ABOUT:  Any allergies you have.  All medicines you are taking, including vitamins, herbs, eye drops, creams, and over-the-counter medicines.  Previous problems you or members of your family have had with the use of anesthetics.  Any blood disorders you have.  Previous surgeries you have had.  Medical conditions you have. RISKS AND COMPLICATIONS Generally, this is a safe procedure. However, as with any procedure, complications can occur. Possible complications include:  Infection.  Damage to thecommon bile duct, nerves, arteries, veins, or other internal organs such as the stomach or intestines.  Bleeding after surgery.  A stone may remain in the common bile duct. BEFORE THE PROCEDURE  Ask your health care provider about changing or stopping any regular medicines. You will need to stop taking aspirin or blood thinners at least 5 days prior to surgery.  Do not eat or drink anything after midnight  the night before surgery.  Let your health care provider know if you develop a cold or other infectious problem before surgery. PROCEDURE   You will be given medicine that makes you sleep through the procedure (general anesthetic).  When you are asleep, your surgeon will make a cut (incision) in the upper abdomen to access your gallbladder.  The surgeon will examine the inside of your abdomen and make sure there are no other problems. Your gallbladder will then be removed.  An X-ray exam of your common bile duct may be done to look for stones that may have fallen into this duct.  After the removal of your gallbladder, your abdomen will be closed with stitches (sutures) or staples. AFTER THE PROCEDURE  You will be taken to a recovery area where your progress will be checked often.  You will likely need to stay in the hospital for 3-5 days.   This information is not intended to replace advice given to you by your health care provider. Make sure you discuss any questions you have with your health care provider.   Document Released: 02/01/2002 Document Revised: 03/02/2013 Document Reviewed: 12/22/2012 Elsevier Interactive Patient Education 2016 Elsevier Inc[employer]   Open Cholecystectomy, Care After Refer to this sheet in the next few weeks. These instructions provide you with information on caring for yourself after your procedure. Your health care provider may also give you more specific instructions. Your treatment has been planned according to current medical practices, but problems sometimes occur. Call your health care provider if you have any problems or questions after your procedure. WHAT TO EXPECT AFTER THE PROCEDURE After your procedure, it is typical to  have the following:  Pain at your incision site. You will be given medicines to control this pain.  Constipation. You may be given stool softeners to help prevent this. HOME CARE INSTRUCTIONS   Change bandages (dressings) as  directed by your health care provider.  Keep the wound dry and clean. You may wash the wound gently with soap and water. Gently blot or dab the wound dry.  It is okay to take showers 48 hours after surgery. Do not take baths or use swimming pools or hot tubs for 2 weeks or until your health care provider approves.  Only take over-the-counter or prescription medicines as directed by your health care provider.  Continue your normal diet as directed by your health care provider. Eat plenty of fruits and vegetables to help prevent constipation.  Drink enough fluids to keep your urine clear or pale yellow. This also helps prevent constipation.  Do not lift anything heavier than 10 pounds (4.5 kg) for 1 month or until your health care provider approves.  Do not play contact sports for 1 month or until your health care provider approves.  Do not return to work or school for 4 weeks or until your health care provider approves. SEEK MEDICAL CARE IF:   You have redness, swelling, or increasing pain in the wound.  You see a yellowish-white fluid (pus) coming from the wound.  You have drainage from the wound that lasts longer than 1 day.  You notice a bad smell coming from the wound or dressing.  Your wound breaks open after the stitches (sutures) or staples have been removed.  You have increasing pain in the shoulders (shoulder strap areas).  You have dizzy episodes or faint while standing.  You feel sick to your stomach (nauseous) or throw up (vomit). SEEK IMMEDIATE MEDICAL CARE IF:   You have a fever.  You develop a rash.  You have difficulty breathing.  You have chest pain.  You have severe abdominal pain.  You have leg pain.   This information is not intended to replace advice given to you by your health care provider. Make sure you discuss any questions you have with your health care provider.   Document Released: 08/28/2003 Document Revised: 03/02/2013 Document Reviewed:  12/22/2012 Elsevier Interactive Patient Education [employer].

## 2016-04-02 NOTE — Progress Notes (Signed)
Patient ID: Victoria Dalton, female   DOB: [birth year], 82 y.o.   MRN: [id number]  HPI Victoria Dalton is a 82 y.o. female f/u after  dx laparoscopy and biopsies showed no evidence of malignancy. Repeat CT scan showed evidence of chronic cholecystitis but no evidence of either gallbladder or liver mass 9 personally reviewed, stent in place and CBD only mildly dilated. The patient is feeling well and her only recent complaint was left submandibular swelling and pain. She reports that her symptoms are getting better and she had recently had an some dental treatment performed. She is able to eat and does not have any abdominal pain or complication for her diagnostic laparoscopy. No evidence of obstructive jaundice or cholangitis.  HPI  Past Medical History:  Diagnosis Date  . Diabetes mellitus without complication ([place])   . Hyperlipidemia   . Hypertension   . Kidney stone   . UTI (lower urinary tract infection)     Past Surgical History:  Procedure Laterality Date  . DIAGNOSTIC LAPAROSCOPIC LIVER BIOPSY  02/28/2016   Procedure: DIAGNOSTIC LAPAROSCOPIC LIVER BIOPSY;  Surgeon: Jules Husbands, MD;  Location: [facility] ORS;  Service: General;;  . ERCP N/A 01/13/2016   Procedure: ENDOSCOPIC RETROGRADE CHOLANGIOPANCREATOGRAPHY (ERCP);  Surgeon: Lucilla Lame, MD;  Location: [facility] ENDOSCOPY;  Service: Endoscopy;  Laterality: N/A;  . KNEE ARTHROSCOPY Left   . LAPAROSCOPY  02/28/2016   Procedure: Diagnotic laparoscopy with omental biopsy;  Surgeon: Jules Husbands, MD;  Location: [facility] ORS;  Service: General;;  . TONSILLECTOMY      Family History  Problem Relation Age of Onset  . Cancer Father   . Heart attack Father     Social History Social History  Substance Use Topics  . Smoking status: Never Smoker  . Smokeless tobacco: Never Used  . Alcohol use No    Allergies  Allergen Reactions  . Chlorhexidine Itching  . [place] Extract]   . Ramipril Other (See Comments)   IRREGULAR HEART BEAT  . Penicillins Rash    Has patient had a PCN reaction causing immediate rash, facial/tongue/throat swelling, SOB or lightheadedness with hypotension: Yes Has patient had a PCN reaction causing severe rash involving mucus membranes or skin necrosis: Yes Has patient had a PCN reaction that required hospitalization No Has patient had a PCN reaction occurring within the last 10 years: No If all of the above answers are "NO", then may proceed with Cephalosporin use.   . Sulfa Antibiotics Rash    Current Outpatient Prescriptions  Medication Sig Dispense Refill  . glimepiride (AMARYL) 2 MG tablet Take 1 mg by mouth every morning.     Marland Kitchen losartan (COZAAR) 100 MG tablet Take 100 mg by mouth daily.    . metoprolol succinate (TOPROL-XL) 100 MG 24 hr tablet Take 100 mg by mouth daily.     . Multiple Vitamins-Minerals (PRESERVISION AREDS PO) Take 1 capsule by mouth 2 (two) times daily.     Marland Kitchen nystatin (MYCOSTATIN) 100000 UNIT/ML suspension Take 15 mLs by mouth 3 (three) times daily.    . ONE TOUCH ULTRA TEST test strip   0  . pantoprazole (PROTONIX) 40 MG tablet Take 1 tablet (40 mg total) by mouth daily. 30 tablet 6  . simvastatin (ZOCOR) 40 MG tablet Take 40 mg by mouth at bedtime.  0  . sitaGLIPtin (JANUVIA) 50 MG tablet Take 50 mg by mouth daily.     No current facility-administered medications for this visit.  Review of Systems A 10 point review of systems was asked and was negative except for the information on the HPI  Physical Exam Blood pressure (!) 162/78, pulse (!) 59, temperature 98.8 F (37.1 C), temperature source Oral, height 5\' 9"  (1.753 m), weight 102.7 kg (226 lb 6.4 oz). CONSTITUTIONAL: NAD EYES: Pupils are equal, round, and reactive to light, Sclera are non-icteric. EARS, NOSE, MOUTH AND THROAT: The oropharynx is clear. The oral mucosa is pink and moist. Hearing is intact to voice. LYMPH NODES:  Lymph nodes in the neck are normal. RESPIRATORY:  Lungs  are clear. There is normal respiratory effort, with equal breath sounds bilaterally, and without pathologic use of accessory muscles. CARDIOVASCULAR: Heart is regular without murmurs, gallops, or rubs. GI: The abdomen is soft, nontender, and nondistended. There are no palpable masses. There is no hepatosplenomegaly. There are normal bowel sounds in all quadrants. Laparoscopic scars healing well, no infection. GU: Rectal deferred.   MUSCULOSKELETAL: Normal muscle strength and tone. No cyanosis or edema.   SKIN: Turgor is good and there are no pathologic skin lesions or ulcers. NEUROLOGIC: Motor and sensation is grossly normal. Cranial nerves are grossly intact. PSYCH:  Oriented to person, place and time. Affect is normal.  Data Reviewed  I have personally reviewed the patient's imaging, laboratory findings and medical records.    Assessment/ Plan Complicated case of chronic cholecystitis with a  questionable gallbladder mass. We have done everything from a diagnostic perspective to prove there is no gallbladder malignancy. Given previous laparoscopic findings and evidence of inflammation I think she is better served with an open cholecystectomy. Discussed with the patient in detail about my thought process. We will wait until she completes her ERCP and stent removal by Dr.Wohl. D/w her in detail about the risks of surgery, benefits and possible complications ( hearing but not limited to: Bleeding, infection, re-interventions, common bile duct exploration and common bile duct injury). She understands and wishes to proceed.   Caroleen Hamman, MD FACS General Surgeon 04/02/2016, 10:01 AM

## 2016-04-04 ENCOUNTER — Telehealth: Payer: Self-pay | Admitting: Surgery

## 2016-04-04 NOTE — Telephone Encounter (Signed)
Pt advised of pre op date/time and sx date. Sx: 04/30/16 with Dr Pabon--Open Cholecystectomy.  Pre op: 04/21/16 @ 9:30am--Office.   Patient made aware to call [phone], between 1-3:00pm the day before surgery, to find out what time to arrive.

## 2016-04-07 ENCOUNTER — Encounter: Payer: Self-pay | Admitting: *Deleted

## 2016-04-08 ENCOUNTER — Ambulatory Visit
Admission: RE | Admit: 2016-04-08 | Discharge: 2016-04-08 | Disposition: A | Discharge status: Home / Self Care | Payer: Medicare Other | Source: Ambulatory Visit | Attending: Gastroenterology | Admitting: Gastroenterology

## 2016-04-08 ENCOUNTER — Encounter
Admission: RE | Disposition: A | Discharge status: Home / Self Care | Payer: Self-pay | Source: Ambulatory Visit | Attending: Gastroenterology

## 2016-04-08 ENCOUNTER — Ambulatory Visit: Payer: Medicare Other | Admitting: Registered Nurse

## 2016-04-08 DIAGNOSIS — K219 Gastro-esophageal reflux disease without esophagitis: Secondary | ICD-10-CM | POA: Diagnosis not present

## 2016-04-08 DIAGNOSIS — Z6832 Body mass index (BMI) 32.0-32.9, adult: Secondary | ICD-10-CM | POA: Diagnosis not present

## 2016-04-08 DIAGNOSIS — I252 Old myocardial infarction: Secondary | ICD-10-CM | POA: Insufficient documentation

## 2016-04-08 DIAGNOSIS — Z4659 Encounter for fitting and adjustment of other gastrointestinal appliance and device: Secondary | ICD-10-CM | POA: Insufficient documentation

## 2016-04-08 DIAGNOSIS — E785 Hyperlipidemia, unspecified: Secondary | ICD-10-CM | POA: Insufficient documentation

## 2016-04-08 DIAGNOSIS — E119 Type 2 diabetes mellitus without complications: Secondary | ICD-10-CM | POA: Diagnosis not present

## 2016-04-08 DIAGNOSIS — I10 Essential (primary) hypertension: Secondary | ICD-10-CM | POA: Diagnosis not present

## 2016-04-08 DIAGNOSIS — K921 Melena: Secondary | ICD-10-CM

## 2016-04-08 DIAGNOSIS — K8030 Calculus of bile duct with cholangitis, unspecified, without obstruction: Secondary | ICD-10-CM | POA: Diagnosis not present

## 2016-04-08 DIAGNOSIS — K8050 Calculus of bile duct without cholangitis or cholecystitis without obstruction: Secondary | ICD-10-CM | POA: Insufficient documentation

## 2016-04-08 DIAGNOSIS — Z9689 Presence of other specified functional implants: Secondary | ICD-10-CM | POA: Insufficient documentation

## 2016-04-08 HISTORY — PX: ERCP: SHX5425

## 2016-04-08 HISTORY — DX: Gastro-esophageal reflux disease without esophagitis: K21.9

## 2016-04-08 LAB — GLUCOSE, CAPILLARY: Glucose-Capillary: 109 mg/dL — ABNORMAL HIGH (ref 65–99)

## 2016-04-08 SURGERY — ERCP, WITH INTERVENTION IF INDICATED
Anesthesia: General

## 2016-04-08 MED ORDER — SUGAMMADEX SODIUM 200 MG/2ML IV SOLN
INTRAVENOUS | Status: DC | PRN
  Administered 2016-04-08: 200 mg via INTRAVENOUS

## 2016-04-08 MED ORDER — ROCURONIUM BROMIDE 100 MG/10ML IV SOLN
INTRAVENOUS | Status: DC | PRN
  Administered 2016-04-08: 20 mg via INTRAVENOUS

## 2016-04-08 MED ORDER — SODIUM CHLORIDE 0.9 % IV SOLN
INTRAVENOUS | Status: DC
Start: 2016-04-08 — End: 2016-04-08

## 2016-04-08 MED ORDER — ACETAMINOPHEN 500 MG PO TABS: 1000.0000 mg | ORAL_TABLET | ORAL | Status: DC

## 2016-04-08 MED ORDER — EPHEDRINE SULFATE 50 MG/ML IJ SOLN
INTRAMUSCULAR | Status: DC | PRN
Start: 2016-04-08 — End: 2016-04-08
  Administered 2016-04-08 (×3): 5 mg via INTRAVENOUS

## 2016-04-08 MED ORDER — SODIUM CHLORIDE 0.9 % IV SOLN
INTRAVENOUS | Status: DC
  Administered 2016-04-08: 10:00:00 via INTRAVENOUS

## 2016-04-08 MED ORDER — GABAPENTIN 300 MG PO CAPS: 300.0000 mg | ORAL_CAPSULE | ORAL | Status: DC

## 2016-04-08 MED ORDER — PHENYLEPHRINE HCL 10 MG/ML IJ SOLN
INTRAMUSCULAR | Status: DC | PRN
  Administered 2016-04-08: 100 ug via INTRAVENOUS

## 2016-04-08 MED ORDER — GENTAMICIN SULFATE 40 MG/ML IJ SOLN
5.0000 mg/kg | INTRAVENOUS | Status: DC
  Filled 2016-04-08: qty 12.75

## 2016-04-08 MED ORDER — SUCCINYLCHOLINE CHLORIDE 20 MG/ML IJ SOLN
INTRAMUSCULAR | Status: DC | PRN
  Administered 2016-04-08: 100 mg via INTRAVENOUS

## 2016-04-08 MED ORDER — PROPOFOL 10 MG/ML IV BOLUS
INTRAVENOUS | Status: DC | PRN
  Administered 2016-04-08: 150 mg via INTRAVENOUS

## 2016-04-08 MED ORDER — ONDANSETRON HCL 4 MG/2ML IJ SOLN
INTRAMUSCULAR | Status: DC | PRN
Start: 2016-04-08 — End: 2016-04-08
  Administered 2016-04-08: 4 mg via INTRAVENOUS

## 2016-04-08 MED ORDER — CLINDAMYCIN PHOSPHATE 900 MG/50ML IV SOLN
900.0000 mg | INTRAVENOUS | Status: DC
  Filled 2016-04-08: qty 50

## 2016-04-08 MED ORDER — FENTANYL CITRATE (PF) 100 MCG/2ML IJ SOLN
INTRAMUSCULAR | Status: DC | PRN
  Administered 2016-04-08: 50 ug via INTRAVENOUS

## 2016-04-08 MED ORDER — LIDOCAINE HCL (CARDIAC) 20 MG/ML IV SOLN
INTRAVENOUS | Status: DC | PRN
  Administered 2016-04-08: 60 mg via INTRAVENOUS

## 2016-04-08 NOTE — OR Nursing (Signed)
Pt received from pacu at 1320.Marland Kitchen Report from emily rn

## 2016-04-08 NOTE — Brief Op Note (Signed)
Pt extubated successfully by D. Lorenza Chick, CRNA.

## 2016-04-08 NOTE — Transfer of Care (Signed)
Immediate Anesthesia Transfer of Care Note  Patient: Victoria Dalton  Procedure(s) Performed: Procedure(s): ENDOSCOPIC RETROGRADE CHOLANGIOPANCREATOGRAPHY (ERCP) Stent removal (N/A)  Patient Location: PACU  Anesthesia Type:General  Level of Consciousness: awake and alert   Airway & Oxygen Therapy: Patient Spontanous Breathing and Patient connected to face mask oxygen  Post-op Assessment: Report given to RN and Post -op Vital signs reviewed and stable  Post vital signs: Reviewed and stable  Last Vitals:  Vitals:   04/08/16 1031  BP: (!) 153/95  Pulse: (!) 59  Resp: 16  Temp: 36.2 C    Last Pain:  Vitals:   04/08/16 1031  TempSrc: Oral  PainSc: 2          Complications: No apparent anesthesia complications

## 2016-04-08 NOTE — Anesthesia Postprocedure Evaluation (Signed)
Anesthesia Post Note  Patient: Victoria Dalton  Procedure(s) Performed: Procedure(s) (LRB): ENDOSCOPIC RETROGRADE CHOLANGIOPANCREATOGRAPHY (ERCP) Stent removal (N/A)  Patient location during evaluation: PACU Anesthesia Type: General Level of consciousness: awake Pain management: satisfactory to patient Vital Signs Assessment: post-procedure vital signs reviewed and stable Respiratory status: nonlabored ventilation Cardiovascular status: stable Anesthetic complications: no    Last Vitals:  Vitals:   04/08/16 1300 04/08/16 1320  BP: (!) 159/58 130/65  Pulse: 60 62  Resp: 20 20  Temp: 36.6 C     Last Pain:  Vitals:   04/08/16 1031  TempSrc: Oral  PainSc: 2                  VAN STAVEREN,Eulogia Dismore

## 2016-04-08 NOTE — Anesthesia Preprocedure Evaluation (Signed)
Anesthesia Evaluation  Patient identified by MRN, date of birth, ID band Patient awake    Airway Mallampati: II       Dental  (+) Teeth Intact   Pulmonary neg pulmonary ROS,     + decreased breath sounds      Cardiovascular Exercise Tolerance: Poor hypertension, + Past MI   Rhythm:Regular Rate:Normal     Neuro/Psych negative neurological ROS  negative psych ROS   GI/Hepatic Neg liver ROS, GERD  Medicated,  Endo/Other  diabetes, Type 2Morbid obesity  Renal/GU      Musculoskeletal   Abdominal (+) + obese,   Peds negative pediatric ROS (+)  Hematology   Anesthesia Other Findings   Reproductive/Obstetrics                             Anesthesia Physical Anesthesia Plan  ASA: III  Anesthesia Plan: General   Post-op Pain Management:    Induction: Intravenous  Airway Management Planned: Natural Airway and Nasal Cannula  Additional Equipment:   Intra-op Plan:   Post-operative Plan:   Informed Consent: I have reviewed the patients History and Physical, chart, labs and discussed the procedure including the risks, benefits and alternatives for the proposed anesthesia with the patient or authorized representative who has indicated his/her understanding and acceptance.     Plan Discussed with: CRNA  Anesthesia Plan Comments:         Anesthesia Quick Evaluation

## 2016-04-08 NOTE — H&P (Signed)
Lucilla Lame, MD [facility] [address], [place], [place] [address] Phone: [phone] Fax : [phone]  Primary Care Physician:  Cletis Athens, MD Primary Gastroenterologist:  Dr. Allen Norris  Pre-Procedure History & Physical: HPI:  Victoria Dalton is a 82 y.o. female is here for an ERCP.   Past Medical History:  Diagnosis Date  . Diabetes mellitus without complication ([place])   . GERD (gastroesophageal reflux disease)   . Hyperlipidemia   . Hypertension   . Kidney stone   . UTI (lower urinary tract infection)     Past Surgical History:  Procedure Laterality Date  . DIAGNOSTIC LAPAROSCOPIC LIVER BIOPSY  02/28/2016   Procedure: DIAGNOSTIC LAPAROSCOPIC LIVER BIOPSY;  Surgeon: Jules Husbands, MD;  Location: [facility] ORS;  Service: General;;  . ERCP N/A 01/13/2016   Procedure: ENDOSCOPIC RETROGRADE CHOLANGIOPANCREATOGRAPHY (ERCP);  Surgeon: Lucilla Lame, MD;  Location: [facility] ENDOSCOPY;  Service: Endoscopy;  Laterality: N/A;  . KNEE ARTHROSCOPY Left   . LAPAROSCOPY  02/28/2016   Procedure: Diagnotic laparoscopy with omental biopsy;  Surgeon: Jules Husbands, MD;  Location: [facility] ORS;  Service: General;;  . TONSILLECTOMY      Prior to Admission medications   Medication Sig Start Date End Date Taking? Authorizing Provider  glimepiride (AMARYL) 2 MG tablet Take 1 mg by mouth every morning.  12/22/14  Yes Historical Provider, MD  losartan (COZAAR) 100 MG tablet Take 100 mg by mouth daily.   Yes Historical Provider, MD  metoprolol succinate (TOPROL-XL) 100 MG 24 hr tablet Take 100 mg by mouth daily.  11/06/15  Yes Historical Provider, MD  Multiple Vitamins-Minerals (PRESERVISION AREDS PO) Take 1 capsule by mouth 2 (two) times daily.    Yes Historical Provider, MD  nystatin (MYCOSTATIN) 100000 UNIT/ML suspension Take 15 mLs by mouth 3 (three) times daily. 03/21/16  Yes Historical Provider, MD  pantoprazole (PROTONIX) 40 MG tablet Take 1 tablet (40 mg total) by mouth daily. 12/06/15  Yes Lucilla Lame, MD   simvastatin (ZOCOR) 40 MG tablet Take 40 mg by mouth at bedtime. 10/12/15  Yes Historical Provider, MD  sitaGLIPtin (JANUVIA) 50 MG tablet Take 50 mg by mouth daily.   Yes Historical Provider, MD  ONE TOUCH ULTRA TEST test strip  11/02/15   Historical Provider, MD    Allergies as of 01/21/2016 - Review Complete 01/15/2016  Allergen Reaction Noted  . Ramipril Other (See Comments) 12/06/2015  . Penicillins Rash 10/14/2015  . Sulfa antibiotics Rash 10/14/2015    Family History  Problem Relation Age of Onset  . Cancer Father   . Heart attack Father     Social History   Social History  . Marital status: Widowed    Spouse name: N/A  . Number of children: N/A  . Years of education: N/A   Occupational History  . Not on file.   Social History Main Topics  . Smoking status: Never Smoker  . Smokeless tobacco: Never Used  . Alcohol use No  . Drug use: No  . Sexual activity: Not on file   Other Topics Concern  . Not on file   Social History Narrative  . No narrative on file    Review of Systems: See HPI, otherwise negative ROS  Physical Exam: BP (!) 153/95   Pulse (!) 59   Temp 97.1 F (36.2 C) (Oral)   Resp 16   Ht 5\' 9"  (1.753 m)   Wt 223 lb (101.2 kg)   SpO2 95%   BMI 32.93 kg/m  General:  Alert,  pleasant and cooperative in NAD Head:  Normocephalic and atraumatic. Neck:  Supple; no masses or thyromegaly. Lungs:  Clear throughout to auscultation.    Heart:  Regular rate and rhythm. Abdomen:  Soft, nontender and nondistended. Normal bowel sounds, without guarding, and without rebound.   Neurologic:  Alert and  oriented x4;  grossly normal neurologically.  Impression/Plan: Victoria Dalton is here for an ERCP to be performed for stent removal  Risks, benefits, limitations, and alternatives regarding  ERCP have been reviewed with the patient.  Questions have been answered.  All parties agreeable.   Lucilla Lame, MD  04/08/2016, 11:17 AM

## 2016-04-08 NOTE — Op Note (Signed)
[facility] Gastroenterology Patient Name: Victoria Dalton Procedure Date: 04/08/2016 11:31 AM MRN: [id number] Account #: [id number] Date of Birth: 1930/08/02 Admit Type: Outpatient Age: 82 Room: [facility] ENDO ROOM 4 Gender: Female Note Status: Finalized Procedure:            ERCP Indications:          Stent removal Providers:            Lucilla Lame MD, MD Referring MD:         Cletis Athens, MD (Referring MD) Medicines:            General Anesthesia Complications:        No immediate complications. Procedure:            Pre-Anesthesia Assessment:                       - Prior to the procedure, a History and Physical was                        performed, and patient medications and allergies were                        reviewed. The patient's tolerance of previous                        anesthesia was also reviewed. The risks and benefits of                        the procedure and the sedation options and risks were                        discussed with the patient. All questions were                        answered, and informed consent was obtained. Prior                        Anticoagulants: The patient has taken no previous                        anticoagulant or antiplatelet agents. ASA Grade                        Assessment: II - A patient with mild systemic disease.                        After reviewing the risks and benefits, the patient was                        deemed in satisfactory condition to undergo the                        procedure.                       After obtaining informed consent, the scope was passed                        under direct vision. Throughout the procedure, the  patient's blood pressure, pulse, and oxygen saturations                        were monitored continuously. The Endoscope was                        introduced through the mouth, and used to inject                        contrast into and used  to inject contrast into the bile                        duct. The ERCP was accomplished without difficulty. The                        patient tolerated the procedure well. Findings:      A biliary stent was visible on the scout film. One plastic stent       originating in the biliary tree was emerging from the major papilla. The       bile duct was deeply cannulated. Contrast was injected. I personally       interpreted the bile duct images. There was brisk flow of contrast       through the ducts. Image quality was excellent. Contrast extended to the       hepatic ducts. The lower third of the main bile duct contained Eight mm.       The main bile duct was diffusely dilated. One stent was removed from the       biliary tree using a snare. The biliary tree was swept with a 15 mm       balloon starting at the bifurcation. Eight stones were removed. No       stones remained. Impression:           - One stent from the biliary tree was seen in the major                        papilla.                       - The entire main bile duct was dilated.                       - Choledocholithiasis was found. Complete removal was                        accomplished by balloon extraction.                       - One stent was removed from the biliary tree.                       - The biliary tree was swept. Recommendation:       - Watch for pancreatitis, bleeding, perforation, and                        cholangitis. Procedure Code(s):    --- Professional ---                       [phone], Endoscopic retrograde cholangiopancreatography                        (  ERCP); with removal of foreign body(s) or stent(s)                        from biliary/pancreatic duct(s)                       43264, Endoscopic retrograde cholangiopancreatography                        (ERCP); with removal of calculi/debris from                        biliary/pancreatic duct(s)                       [phone], Endoscopic  catheterization of the biliary ductal                        system, radiological supervision and interpretation Diagnosis Code(s):    --- Professional ---                       K80.50, Calculus of bile duct without cholangitis or                        cholecystitis without obstruction                       Z96.89, Presence of other specified functional implants                       Z46.59, Encounter for fitting and adjustment of other                        gastrointestinal appliance and device                       K83.8, Other specified diseases of biliary tract CPT copyright 2016 American Medical Association. All rights reserved. The codes documented in this report are preliminary and upon coder review may  be revised to meet current compliance requirements. Lucilla Lame MD, MD 04/08/2016 12:15:50 PM This report has been signed electronically. Number of Addenda: 0 Note Initiated On: 04/08/2016 11:31 AM      [facility]

## 2016-04-08 NOTE — Brief Op Note (Signed)
Pt intubated for ERCP without difficulty by D. Lorenza Chick, CRNA and Dr. Boston Service.

## 2016-04-08 NOTE — Anesthesia Procedure Notes (Signed)
Procedure Name: Intubation Date/Time: 04/08/2016 11:36 AM Performed by: Hedda Slade Pre-anesthesia Checklist: Patient identified, Emergency Drugs available, Suction available, Patient being monitored and Timeout performed Patient Re-evaluated:Patient Re-evaluated prior to inductionOxygen Delivery Method: Circle system utilized Preoxygenation: Pre-oxygenation with 100% oxygen Intubation Type: IV induction Ventilation: Mask ventilation without difficulty Laryngoscope Size: Mac and 3 Grade View: Grade II Tube type: Oral Tube size: 7.0 mm Number of attempts: 1 Airway Equipment and Method: Stylet Placement Confirmation: ETT inserted through vocal cords under direct vision,  positive ETCO2 and breath sounds checked- equal and bilateral Secured at: 22 cm Tube secured with: Tape Dental Injury: Teeth and Oropharynx as per pre-operative assessment

## 2016-04-09 ENCOUNTER — Encounter: Payer: Self-pay | Admitting: Gastroenterology

## 2016-04-09 LAB — GLUCOSE, CAPILLARY: Glucose-Capillary: 104 mg/dL — ABNORMAL HIGH (ref 65–99)

## 2016-04-11 ENCOUNTER — Other Ambulatory Visit (HOSPITAL_COMMUNITY): Payer: Self-pay | Admitting: Pharmacy Technician

## 2016-04-21 ENCOUNTER — Encounter
Admission: RE | Admit: 2016-04-21 | Discharge: 2016-04-21 | Disposition: A | Discharge status: Home / Self Care | Payer: Medicare Other | Source: Ambulatory Visit | Attending: Surgery | Admitting: Surgery

## 2016-04-21 ENCOUNTER — Other Ambulatory Visit: Payer: Self-pay

## 2016-04-21 DIAGNOSIS — Z01812 Encounter for preprocedural laboratory examination: Secondary | ICD-10-CM | POA: Diagnosis not present

## 2016-04-21 DIAGNOSIS — K811 Chronic cholecystitis: Secondary | ICD-10-CM | POA: Insufficient documentation

## 2016-04-21 HISTORY — DX: Calculus of gallbladder without cholecystitis without obstruction: K80.20

## 2016-04-21 HISTORY — DX: Dyspnea, unspecified: R06.00

## 2016-04-21 HISTORY — DX: Reserved for concepts with insufficient information to code with codable children: IMO0002

## 2016-04-21 HISTORY — DX: Localized swelling, mass and lump, unspecified: R22.9

## 2016-04-21 HISTORY — DX: Edema, unspecified: R60.9

## 2016-04-21 LAB — CBC WITH DIFFERENTIAL/PLATELET
Basophils Absolute: 0.1 10*3/uL (ref 0–0.1)
Basophils Relative: 1 %
Eosinophils Absolute: 0.1 10*3/uL (ref 0–0.7)
Eosinophils Relative: 1 %
HCT: 42.8 % (ref 35.0–47.0)
Hemoglobin: 14.6 g/dL (ref 12.0–16.0)
Lymphocytes Relative: 19 %
Lymphs Abs: 1.9 10*3/uL (ref 1.0–3.6)
MCH: 29.1 pg (ref 26.0–34.0)
MCHC: 34.1 g/dL (ref 32.0–36.0)
MCV: 85.3 fL (ref 80.0–100.0)
Monocytes Absolute: 0.8 10*3/uL (ref 0.2–0.9)
Monocytes Relative: 7 %
Neutro Abs: 7.3 10*3/uL — ABNORMAL HIGH (ref 1.4–6.5)
Neutrophils Relative %: 72 %
Platelets: 224 10*3/uL (ref 150–440)
RBC: 5.01 MIL/uL (ref 3.80–5.20)
RDW: 14.6 % — ABNORMAL HIGH (ref 11.5–14.5)
WBC: 10.1 10*3/uL (ref 3.6–11.0)

## 2016-04-21 LAB — COMPREHENSIVE METABOLIC PANEL
ALT: 25 U/L (ref 14–54)
AST: 27 U/L (ref 15–41)
Albumin: 4.2 g/dL (ref 3.5–5.0)
Alkaline Phosphatase: 147 U/L — ABNORMAL HIGH (ref 38–126)
Anion gap: 7 (ref 5–15)
BUN: 21 mg/dL — ABNORMAL HIGH (ref 6–20)
CO2: 27 mmol/L (ref 22–32)
Calcium: 9.4 mg/dL (ref 8.9–10.3)
Chloride: 104 mmol/L (ref 101–111)
Creatinine, Ser: 1.03 mg/dL — ABNORMAL HIGH (ref 0.44–1.00)
GFR calc Af Amer: 57 mL/min — ABNORMAL LOW (ref >60)
GFR calc non Af Amer: 49 mL/min — ABNORMAL LOW (ref >60)
Glucose, Bld: 105 mg/dL — ABNORMAL HIGH (ref 65–99)
Potassium: 4.4 mmol/L (ref 3.5–5.1)
Sodium: 138 mmol/L (ref 135–145)
Total Bilirubin: 0.7 mg/dL (ref 0.3–1.2)
Total Protein: 7.5 g/dL (ref 6.5–8.1)

## 2016-04-21 NOTE — Patient Instructions (Signed)
  Your procedure is scheduled on: 04/30/16 Wed Report to Same Day Surgery 2nd floor medical mall [facility] Entrance-take elevator on left to 2nd floor.  Check in with surgery information desk.) To find out your arrival time please call [phone] between 1PM - 3PM on 04/29/16 Tues  Remember: Instructions that are not followed completely may result in serious medical risk, up to and including death, or upon the discretion of your surgeon and anesthesiologist your surgery may need to be rescheduled.    _x___ 1. Do not eat food or drink liquids after midnight. No gum chewing or hard candies.     __x__ 2. No Alcohol for 24 hours before or after surgery.   __x__3. No Smoking for 24 prior to surgery.   ____  4. Bring all medications with you on the day of surgery if instructed.    __x__ 5. Notify your doctor if there is any change in your medical condition     (cold, fever, infections).     Do not wear jewelry, make-up, hairpins, clips or nail polish.  Do not wear lotions, powders, or perfumes. You may wear deodorant.  Do not shave 48 hours prior to surgery. Men may shave face and neck.  Do not bring valuables to the hospital.    [facility] is not responsible for any belongings or valuables.               Contacts, dentures or bridgework may not be worn into surgery.  Leave your suitcase in the car. After surgery it may be brought to your room.  For patients admitted to the hospital, discharge time is determined by your treatment team.   Patients discharged the day of surgery will not be allowed to drive home.  You will need someone to drive you home and stay with you the night of your procedure.    Please read over the following fact sheets that you were given:   [facility] Preparing for Surgery and or MRSA Information   _x___ Take these medicines the morning of surgery with A SIP OF WATER:    1. losartan (COZAAR) 100 MG tablet  2.metoprolol succinate (TOPROL-XL) 100 MG 24 hr  tablet  3.pantoprazole (PROTONIX) 40 MG tablet  4.  5.  6.  ____Fleets enema or Magnesium Citrate as directed.   _x___ Use CHG Soap or sage wipes as directed on instruction sheet   ____ Use inhalers on the day of surgery and bring to hospital day of surgery  ____ Stop metformin 2 days prior to surgery    ____ Take 1/2 of usual insulin dose the night before surgery and none on the morning of           surgery.   ____ Stop Aspirin, Coumadin, Pllavix ,Eliquis, Effient, or Pradaxa  x__ Stop Anti-inflammatories such as Advil, Aleve, Ibuprofen, Motrin, Naproxen,          Naprosyn, Goodies powders or aspirin products. Ok to take Tylenol.   ____ Stop supplements until after surgery.    ____ Bring C-Pap to the hospital.

## 2016-04-22 NOTE — Progress Notes (Signed)
Reviewed Pre-op Labs. GFR is decreased to 49. Spoke with Dr. Dahlia Byes in regards to this. All other labs look great.  He has ordered to keep Clindamycin 600mg  IV x 1 for pre-op antibtiotic.

## 2016-04-29 DIAGNOSIS — K8020 Calculus of gallbladder without cholecystitis without obstruction: Secondary | ICD-10-CM | POA: Diagnosis not present

## 2016-04-29 DIAGNOSIS — A09 Infectious gastroenteritis and colitis, unspecified: Secondary | ICD-10-CM | POA: Diagnosis not present

## 2016-04-29 DIAGNOSIS — E785 Hyperlipidemia, unspecified: Secondary | ICD-10-CM | POA: Diagnosis not present

## 2016-04-29 DIAGNOSIS — E119 Type 2 diabetes mellitus without complications: Secondary | ICD-10-CM | POA: Diagnosis not present

## 2016-04-30 ENCOUNTER — Encounter: Payer: Self-pay | Admitting: Certified Registered Nurse Anesthetist

## 2016-04-30 ENCOUNTER — Inpatient Hospital Stay
Admission: AD | Admit: 2016-04-30 | Discharge: 2016-05-01 | DRG: 416 | Disposition: A | Discharge status: Home / Self Care | Payer: Medicare Other | Source: Ambulatory Visit | Attending: Surgery | Admitting: Surgery

## 2016-04-30 ENCOUNTER — Ambulatory Visit: Payer: Medicare Other

## 2016-04-30 ENCOUNTER — Ambulatory Visit: Payer: Medicare Other | Admitting: Certified Registered Nurse Anesthetist

## 2016-04-30 ENCOUNTER — Encounter
Admission: AD | Disposition: A | Discharge status: Home / Self Care | Payer: Self-pay | Source: Ambulatory Visit | Attending: Surgery

## 2016-04-30 DIAGNOSIS — I10 Essential (primary) hypertension: Secondary | ICD-10-CM | POA: Diagnosis present

## 2016-04-30 DIAGNOSIS — K219 Gastro-esophageal reflux disease without esophagitis: Secondary | ICD-10-CM | POA: Diagnosis present

## 2016-04-30 DIAGNOSIS — E785 Hyperlipidemia, unspecified: Secondary | ICD-10-CM | POA: Diagnosis present

## 2016-04-30 DIAGNOSIS — Z7984 Long term (current) use of oral hypoglycemic drugs: Secondary | ICD-10-CM

## 2016-04-30 DIAGNOSIS — K660 Peritoneal adhesions (postprocedural) (postinfection): Secondary | ICD-10-CM | POA: Diagnosis present

## 2016-04-30 DIAGNOSIS — Z0389 Encounter for observation for other suspected diseases and conditions ruled out: Secondary | ICD-10-CM | POA: Diagnosis not present

## 2016-04-30 DIAGNOSIS — E119 Type 2 diabetes mellitus without complications: Secondary | ICD-10-CM | POA: Diagnosis present

## 2016-04-30 DIAGNOSIS — K8018 Calculus of gallbladder with other cholecystitis without obstruction: Secondary | ICD-10-CM | POA: Diagnosis not present

## 2016-04-30 DIAGNOSIS — K811 Chronic cholecystitis: Secondary | ICD-10-CM | POA: Diagnosis not present

## 2016-04-30 DIAGNOSIS — K819 Cholecystitis, unspecified: Secondary | ICD-10-CM | POA: Diagnosis not present

## 2016-04-30 HISTORY — PX: INTRAOPERATIVE CHOLANGIOGRAM: SHX5230

## 2016-04-30 HISTORY — PX: CHOLECYSTECTOMY: SHX55

## 2016-04-30 LAB — CBC
HCT: 40.6 % (ref 35.0–47.0)
Hemoglobin: 13.6 g/dL (ref 12.0–16.0)
MCH: 29 pg (ref 26.0–34.0)
MCHC: 33.5 g/dL (ref 32.0–36.0)
MCV: 86.6 fL (ref 80.0–100.0)
Platelets: 163 10*3/uL (ref 150–440)
RBC: 4.69 MIL/uL (ref 3.80–5.20)
RDW: 14.8 % — ABNORMAL HIGH (ref 11.5–14.5)
WBC: 13.6 10*3/uL — ABNORMAL HIGH (ref 3.6–11.0)

## 2016-04-30 LAB — GLUCOSE, CAPILLARY
Glucose-Capillary: 120 mg/dL — ABNORMAL HIGH (ref 65–99)
Glucose-Capillary: 171 mg/dL — ABNORMAL HIGH (ref 65–99)
Glucose-Capillary: 218 mg/dL — ABNORMAL HIGH (ref 65–99)
Glucose-Capillary: 228 mg/dL — ABNORMAL HIGH (ref 65–99)
Glucose-Capillary: 163 mg/dL — ABNORMAL HIGH (ref 65–99)

## 2016-04-30 LAB — CREATININE, SERUM
Creatinine, Ser: 1.09 mg/dL — ABNORMAL HIGH (ref 0.44–1.00)
GFR calc Af Amer: 53 mL/min — ABNORMAL LOW (ref >60)
GFR calc non Af Amer: 46 mL/min — ABNORMAL LOW (ref >60)

## 2016-04-30 SURGERY — CHOLECYSTECTOMY
Anesthesia: General | Wound class: Clean Contaminated

## 2016-04-30 MED ORDER — MIDAZOLAM HCL 2 MG/2ML IJ SOLN
INTRAMUSCULAR | Status: DC | PRN
  Administered 2016-04-30: 1 mg via INTRAVENOUS

## 2016-04-30 MED ORDER — ONDANSETRON HCL 4 MG/2ML IJ SOLN: 4.0000 mg | Freq: Four times a day (QID) | INTRAMUSCULAR | Status: DC | PRN

## 2016-04-30 MED ORDER — GLYCOPYRROLATE 0.2 MG/ML IJ SOLN
INTRAMUSCULAR | Status: DC | PRN
  Administered 2016-04-30: 0.2 mg via INTRAVENOUS

## 2016-04-30 MED ORDER — LACTATED RINGERS IV SOLN
INTRAVENOUS | Status: DC
  Administered 2016-04-30: 14:00:00 via INTRAVENOUS

## 2016-04-30 MED ORDER — BUPIVACAINE LIPOSOME 1.3 % IJ SUSP
INTRAMUSCULAR | Status: AC
Start: 2016-04-30 — End: 2016-04-30
  Filled 2016-04-30: qty 20

## 2016-04-30 MED ORDER — GLIMEPIRIDE 2 MG PO TABS
2.0000 mg | ORAL_TABLET | Freq: Every day | ORAL | Status: DC
  Administered 2016-05-01: 2 mg via ORAL
  Filled 2016-04-30 (×2): qty 1

## 2016-04-30 MED ORDER — BUPIVACAINE-EPINEPHRINE (PF) 0.25% -1:200000 IJ SOLN
INTRAMUSCULAR | Status: AC
  Filled 2016-04-30: qty 30

## 2016-04-30 MED ORDER — INSULIN ASPART 100 UNIT/ML ~~LOC~~ SOLN: 0.0000 [IU] | Freq: Every day | SUBCUTANEOUS | Status: DC

## 2016-04-30 MED ORDER — INSULIN ASPART 100 UNIT/ML ~~LOC~~ SOLN: 0.0000 [IU] | Freq: Three times a day (TID) | SUBCUTANEOUS | Status: DC

## 2016-04-30 MED ORDER — SUGAMMADEX SODIUM 200 MG/2ML IV SOLN
INTRAVENOUS | Status: DC | PRN
  Administered 2016-04-30: 205 mg via INTRAVENOUS

## 2016-04-30 MED ORDER — SODIUM CHLORIDE 0.9 % IJ SOLN
INTRAMUSCULAR | Status: AC
  Filled 2016-04-30: qty 50

## 2016-04-30 MED ORDER — FENTANYL CITRATE (PF) 100 MCG/2ML IJ SOLN
25.0000 ug | INTRAMUSCULAR | Status: DC | PRN
  Administered 2016-04-30: 25 ug via INTRAVENOUS

## 2016-04-30 MED ORDER — BUPIVACAINE LIPOSOME 1.3 % IJ SUSP
INTRAMUSCULAR | Status: DC | PRN
  Administered 2016-04-30: 20 mL

## 2016-04-30 MED ORDER — SODIUM CHLORIDE 0.9 % IV SOLN
INTRAVENOUS | Status: DC
  Administered 2016-04-30: 08:00:00 via INTRAVENOUS

## 2016-04-30 MED ORDER — KETOROLAC TROMETHAMINE 30 MG/ML IJ SOLN
INTRAMUSCULAR | Status: AC
  Administered 2016-04-30: 15 mg
  Filled 2016-04-30: qty 1

## 2016-04-30 MED ORDER — OXYCODONE HCL 5 MG PO TABS: 5.0000 mg | ORAL_TABLET | ORAL | Status: DC | PRN

## 2016-04-30 MED ORDER — LINAGLIPTIN 5 MG PO TABS
5.0000 mg | ORAL_TABLET | Freq: Every day | ORAL | Status: DC
  Administered 2016-05-01: 5 mg via ORAL
  Filled 2016-04-30: qty 1

## 2016-04-30 MED ORDER — CLINDAMYCIN PHOSPHATE 600 MG/50ML IV SOLN
INTRAVENOUS | Status: AC
  Administered 2016-04-30: 600 mg via INTRAVENOUS
  Filled 2016-04-30: qty 50

## 2016-04-30 MED ORDER — FENTANYL CITRATE (PF) 100 MCG/2ML IJ SOLN
INTRAMUSCULAR | Status: DC | PRN
  Administered 2016-04-30 (×4): 50 ug via INTRAVENOUS

## 2016-04-30 MED ORDER — DEXTROSE IN LACTATED RINGERS 5 % IV SOLN
INTRAVENOUS | Status: DC
  Administered 2016-04-30: 1000 mL via INTRAVENOUS

## 2016-04-30 MED ORDER — SUCCINYLCHOLINE CHLORIDE 20 MG/ML IJ SOLN
INTRAMUSCULAR | Status: DC | PRN
  Administered 2016-04-30: 100 mg via INTRAVENOUS

## 2016-04-30 MED ORDER — ONDANSETRON 4 MG PO TBDP: 4.0000 mg | ORAL_TABLET | Freq: Four times a day (QID) | ORAL | Status: DC | PRN

## 2016-04-30 MED ORDER — PROPOFOL 10 MG/ML IV BOLUS
INTRAVENOUS | Status: DC | PRN
  Administered 2016-04-30: 120 mg via INTRAVENOUS
  Administered 2016-04-30: 80 mg via INTRAVENOUS

## 2016-04-30 MED ORDER — ONDANSETRON HCL 4 MG/2ML IJ SOLN
INTRAMUSCULAR | Status: DC | PRN
  Administered 2016-04-30: 4 mg via INTRAVENOUS

## 2016-04-30 MED ORDER — ENOXAPARIN SODIUM 40 MG/0.4ML ~~LOC~~ SOLN
40.0000 mg | SUBCUTANEOUS | Status: DC
  Administered 2016-05-01: 40 mg via SUBCUTANEOUS
  Filled 2016-04-30: qty 0.4

## 2016-04-30 MED ORDER — KETOROLAC TROMETHAMINE 15 MG/ML IJ SOLN
15.0000 mg | Freq: Four times a day (QID) | INTRAMUSCULAR | Status: DC
  Administered 2016-04-30 – 2016-05-01 (×3): 15 mg via INTRAVENOUS
  Filled 2016-04-30 (×3): qty 1

## 2016-04-30 MED ORDER — ROCURONIUM BROMIDE 100 MG/10ML IV SOLN
INTRAVENOUS | Status: DC | PRN
  Administered 2016-04-30: 30 mg via INTRAVENOUS
  Administered 2016-04-30: 5 mg via INTRAVENOUS
  Administered 2016-04-30: 15 mg via INTRAVENOUS
  Administered 2016-04-30 (×2): 10 mg via INTRAVENOUS

## 2016-04-30 MED ORDER — METOPROLOL TARTRATE 50 MG PO TABS
50.0000 mg | ORAL_TABLET | Freq: Two times a day (BID) | ORAL | Status: DC
  Filled 2016-04-30: qty 1

## 2016-04-30 MED ORDER — SODIUM CHLORIDE 0.9 % IR SOLN
Status: DC | PRN
Start: 2016-04-30 — End: 2016-04-30
  Administered 2016-04-30: 50 mL

## 2016-04-30 MED ORDER — ACETAMINOPHEN 10 MG/ML IV SOLN
INTRAVENOUS | Status: AC
  Filled 2016-04-30: qty 100

## 2016-04-30 MED ORDER — DEXAMETHASONE SODIUM PHOSPHATE 10 MG/ML IJ SOLN
INTRAMUSCULAR | Status: DC | PRN
  Administered 2016-04-30: 10 mg via INTRAVENOUS

## 2016-04-30 MED ORDER — ACETAMINOPHEN 10 MG/ML IV SOLN
INTRAVENOUS | Status: DC | PRN
  Administered 2016-04-30: 1000 mg via INTRAVENOUS

## 2016-04-30 MED ORDER — INSULIN ASPART 100 UNIT/ML ~~LOC~~ SOLN
0.0000 [IU] | Freq: Three times a day (TID) | SUBCUTANEOUS | Status: DC
  Administered 2016-04-30 (×2): 5 [IU] via SUBCUTANEOUS
  Filled 2016-04-30: qty 3
  Filled 2016-04-30: qty 2
  Filled 2016-04-30: qty 5

## 2016-04-30 MED ORDER — ONDANSETRON HCL 4 MG/2ML IJ SOLN: 4.0000 mg | Freq: Once | INTRAMUSCULAR | Status: DC | PRN

## 2016-04-30 MED ORDER — LIDOCAINE HCL (CARDIAC) 20 MG/ML IV SOLN
INTRAVENOUS | Status: DC | PRN
  Administered 2016-04-30: 50 mg via INTRAVENOUS

## 2016-04-30 MED ORDER — FENTANYL CITRATE (PF) 100 MCG/2ML IJ SOLN
INTRAMUSCULAR | Status: AC
  Filled 2016-04-30: qty 2

## 2016-04-30 MED ORDER — EPHEDRINE SULFATE 50 MG/ML IJ SOLN
INTRAMUSCULAR | Status: DC | PRN
  Administered 2016-04-30 (×4): 5 mg via INTRAVENOUS
  Administered 2016-04-30: 2.5 mg via INTRAVENOUS
  Administered 2016-04-30: 5 mg via INTRAVENOUS

## 2016-04-30 MED ORDER — BUPIVACAINE-EPINEPHRINE 0.25% -1:200000 IJ SOLN
INTRAMUSCULAR | Status: DC | PRN
  Administered 2016-04-30: 30 mL

## 2016-04-30 MED ORDER — CLINDAMYCIN PHOSPHATE 600 MG/50ML IV SOLN
600.0000 mg | Freq: Once | INTRAVENOUS | Status: AC
  Administered 2016-04-30: 600 mg via INTRAVENOUS

## 2016-04-30 MED ORDER — ACETAMINOPHEN 500 MG PO TABS
1000.0000 mg | ORAL_TABLET | Freq: Four times a day (QID) | ORAL | Status: DC
  Administered 2016-04-30 – 2016-05-01 (×3): 1000 mg via ORAL
  Filled 2016-04-30 (×3): qty 2

## 2016-04-30 MED ORDER — PANTOPRAZOLE SODIUM 40 MG IV SOLR
40.0000 mg | Freq: Every day | INTRAVENOUS | Status: DC
  Administered 2016-04-30: 40 mg via INTRAVENOUS
  Filled 2016-04-30: qty 40

## 2016-04-30 MED ORDER — MORPHINE SULFATE (PF) 4 MG/ML IV SOLN
2.0000 mg | INTRAVENOUS | Status: DC | PRN
  Administered 2016-04-30: 2 mg via INTRAVENOUS
  Filled 2016-04-30: qty 1

## 2016-04-30 SURGICAL SUPPLY — 53 items
BLADE SURG 15 STRL LF DISP TIS (BLADE) ×2 IMPLANT
BLADE SURG 15 STRL SS (BLADE) ×2
BULB RESERV EVAC DRAIN JP 100C (MISCELLANEOUS) IMPLANT
CATH REDDICK CHOLANGI 4FR 50CM (CATHETERS) ×4 IMPLANT
CATH ROBINSON RED A/P 16FR (CATHETERS) ×4 IMPLANT
CHLORAPREP W/TINT 26ML (MISCELLANEOUS) ×4 IMPLANT
CHOLANGIOGRAM CATH TAUT (CATHETERS) IMPLANT
CLIP TI LARGE 6 (CLIP) IMPLANT
CLIP TI MEDIUM 6 (CLIP) IMPLANT
CNTNR SPEC 2.5X3XGRAD LEK (MISCELLANEOUS) ×2
CONRAY 60ML FOR OR (MISCELLANEOUS) IMPLANT
CONT SPEC 4OZ STER OR WHT (MISCELLANEOUS) ×2
CONTAINER SPEC 2.5X3XGRAD LEK (MISCELLANEOUS) ×2 IMPLANT
DRAIN CHANNEL JP 19F (MISCELLANEOUS) IMPLANT
DRAPE C-ARM XRAY 36X54 (DRAPES) ×4 IMPLANT
DRAPE LAPAROTOMY TRNSV 106X77 (MISCELLANEOUS) ×4 IMPLANT
DRSG TELFA 3X8 NADH (GAUZE/BANDAGES/DRESSINGS) ×8 IMPLANT
ELECT BLADE 6.5 EXT (BLADE) ×4 IMPLANT
ELECT CAUTERY BLADE 6.4 (BLADE) ×4 IMPLANT
ELECT REM PT RETURN 9FT ADLT (ELECTROSURGICAL) ×4
ELECTRODE REM PT RTRN 9FT ADLT (ELECTROSURGICAL) ×2 IMPLANT
GAUZE SPONGE 4X4 12PLY STRL (GAUZE/BANDAGES/DRESSINGS) ×8 IMPLANT
GLOVE BIO SURGEON STRL SZ7 (GLOVE) ×12 IMPLANT
GLOVE BIOGEL PI IND STRL 7.0 (GLOVE) ×4 IMPLANT
GLOVE BIOGEL PI INDICATOR 7.0 (GLOVE) ×4
GOWN STRL REUS W/ TWL LRG LVL3 (GOWN DISPOSABLE) ×6 IMPLANT
GOWN STRL REUS W/TWL LRG LVL3 (GOWN DISPOSABLE) ×6
HANDLE YANKAUER SUCT BULB TIP (MISCELLANEOUS) ×4 IMPLANT
HEMOSTAT SURGICEL 2X14 (HEMOSTASIS) ×4 IMPLANT
HOLDER FOLEY CATH W/STRAP (MISCELLANEOUS) ×4 IMPLANT
KIT RM TURNOVER STRD PROC AR (KITS) ×4 IMPLANT
LABEL OR SOLS (LABEL) ×4 IMPLANT
NEEDLE HYPO 22GX1.5 SAFETY (NEEDLE) ×4 IMPLANT
NS IRRIG 1000ML POUR BTL (IV SOLUTION) ×4 IMPLANT
PACK BASIN MAJOR ARMC (MISCELLANEOUS) ×4 IMPLANT
SPONGE DRAIN TRACH 4X4 STRL 2S (GAUZE/BANDAGES/DRESSINGS) ×4 IMPLANT
SPONGE KITTNER 5P (MISCELLANEOUS) IMPLANT
SPONGE LAP 18X18 5 PK (GAUZE/BANDAGES/DRESSINGS) ×4 IMPLANT
STAPLER SKIN PROX 35W (STAPLE) ×4 IMPLANT
SUT ETHILON 3-0 FS-10 30 BLK (SUTURE)
SUT PDS PLUS 0 (SUTURE) ×4
SUT PDS PLUS AB 0 CT-2 (SUTURE) ×4 IMPLANT
SUT SILK 2 0 (SUTURE) ×2
SUT SILK 2 0SH CR/8 30 (SUTURE) ×8 IMPLANT
SUT SILK 2-0 18XBRD TIE 12 (SUTURE) ×2 IMPLANT
SUT SILK 3 0 (SUTURE) ×2
SUT SILK 3-0 18XBRD TIE 12 (SUTURE) ×2 IMPLANT
SUT VIC AB 2-0 CT2 27 (SUTURE) ×4 IMPLANT
SUTURE EHLN 3-0 FS-10 30 BLK (SUTURE) IMPLANT
SYR 20CC LL (SYRINGE) ×12 IMPLANT
TRAY FOLEY CATH SILVER 16FR LF (SET/KITS/TRAYS/PACK) ×4 IMPLANT
TUBING CONNECTING 10 (TUBING) ×6 IMPLANT
TUBING CONNECTING 10' (TUBING) ×2

## 2016-04-30 NOTE — Anesthesia Procedure Notes (Signed)
Procedure Name: Intubation Date/Time: 04/30/2016 9:07 AM Performed by: Johnna Acosta Pre-anesthesia Checklist: Patient identified, Emergency Drugs available, Suction available, Patient being monitored and Timeout performed Patient Re-evaluated:Patient Re-evaluated prior to inductionOxygen Delivery Method: Circle system utilized Preoxygenation: Pre-oxygenation with 100% oxygen Intubation Type: IV induction Ventilation: Mask ventilation with difficulty and Oral airway inserted - appropriate to patient size Laryngoscope Size: Miller, 2, Mac, 4, McGraph and 3 Grade View: Grade III Tube type: Oral Tube size: 7.0 mm Number of attempts: 3 Airway Equipment and Method: Stylet,  Video-laryngoscopy and Bougie stylet Placement Confirmation: ETT inserted through vocal cords under direct vision,  positive ETCO2 and breath sounds checked- equal and bilateral Secured at: 20 cm Tube secured with: Tape Dental Injury: Teeth and Oropharynx as per pre-operative assessment  Difficulty Due To: Difficulty was anticipated and Difficult Airway- due to anterior larynx Future Recommendations: Recommend- induction with short-acting agent, and alternative techniques readily available Comments: DVL x1 unable to visualize mask ventilation with oral airway. Dvl X1 with mac 4 by Dr Janeann Forehand with esophageal intubation  Mask ventilation with oral airway macgrath 3 vcv unable to pass tube  Bougie passed with ETT passed over top.  +etco2 +bbs

## 2016-04-30 NOTE — Op Note (Signed)
Open Cholecystectomy w IOC and extensive lysis of adhesions  Pre-operative Diagnosis: Chronic cholecystitis possible GB mass  Post-operative Diagnosis: Same  Procedure: Open chole, extensive LOA and IOC.  Surgeon: Caroleen Hamman, MD FACS  Anesthesia: Gen. with endotracheal tube  Assistant:Dr. Oaks  Findings: Chronic Cholecystitis  Dense and extensive adhesions from the omentum to the gallbladder, from the duodenum to the gallbladder. There was really no discernible plane of the gallbladder wall. There was significant and extensive thickening of the gallbladder wall. At cholangiogram revealed no evidence of common bile duct injury and contrast reached the duodenum and opacify the left and right hepatic ducts. Frozen section from the gallbladder revealed no evidence of cancer with only chronic inflammatory component. Complex and difficult case given the extensive nature of the adhesions and chronic inflammatory response. Approximately total operative time 2 1/2 hours  Estimated Blood Loss: 50cc         Drains: #19 FR drain RUQ         Specimens: Gallbladder           Complications: none   Procedure Details  The patient was seen again in the Holding Room. The benefits, complications, treatment options, and expected outcomes were discussed with the patient. The risks of bleeding, infection, recurrence of symptoms, failure to resolve symptoms, bile duct damage, bile duct leak, retained common bile duct stone, bowel injury, any of which could require further surgery and/or ERCP, stent, or papillotomy were reviewed with the patient. The likelihood of improving the patient's symptoms with return to their baseline status is good.  The patient and/or family concurred with the proposed plan, giving informed consent.  The patient was taken to Operating Room, identified as Victoria Dalton and the procedure verified as Cholecystectomy.  A Time Out was held and the above information  confirmed.  Prior to the induction of general anesthesia, antibiotic prophylaxis was administered. VTE prophylaxis was in place. General endotracheal anesthesia was then administered and tolerated well. After the induction, the abdomen was prepped with Chloraprep and draped in the sterile fashion. The patient was positioned in the supine position.  Right subcostal incision was created with a 10 blade knife and electrocautery was used to dissect the subcutaneous tissue the anterior fascia and the posterior fascia, we did a muscle sparing incision and a lid retracted the and rectus abdominal muscles and medially and lateral. The abdomen was entered under direct visualization. The patient was positioned  in reverse Trendelenburg, tilted slightly to the patient's left.    There was extensive inflammatory response on the gallbladder with significant thickened wall and distorted anatomy. The omentum was plastered to the gallbladder and the duodenum was also in close proximity. Using a combination of electrocautery and Metzenbaum scissors were able to lyse the adhesions that these were extensive. It took [place] at least 60 minutes 40 adhesions and to order to have adequate exposure. We did have to put the Bookwalter retractor to gain adequate exposure and and the portion of the gallbladder was intrahepatic. Once we identified the fundus were able to do a dome down technique and an identify the cystic duct. We stay away from the midline structures were the common bile duct lives and were able to open the gallbladder and follow its wall to prevent any injury to the common bile duct. Again her anatomy was really distorted this was a small and very thickened gallbladder. Frozen section was taken from the fundus of the gallbladder to make sure this was not a  cholangiocarcinoma. We identified the cystic duct and Perform a cholangiogram after injecting contrast into the biliary structures. We did see adequate opacification  antegrade and retrograde of the contrast. Contrast reached the duodenum without evidence of. Obstruction. There was a generous common bile duct measuring approximately 8 mm in size. There was no evidence of common bile duct leak or any other biliary leaks. The cholangiogram was removed and we actually closed the cystic duct using a pursestring suture because the stump was short and severely inflamed and I was afraid that a simple tie will not be able to hold it. And we did place some Surgicel in the liver bed and also cauterized liver bed was was seen. After making sure that we felt comfortable with the anatomy we irrigated the wound and perform a second look showing no evidence of active bleeding or bile leak. A 19 [place] and Blake drain was placed in the right upper quadrant under direct visualization. An ex Georges Lynch was injected in the incision and along the abdominal wall musculature. The anterior and posterior sheaths were closed with a running 0 PDS suture in standard fashion. Scarpa's fascia was closed with a 2-0 Vicryl in a running fashion. Subcutaneous this tissue was irrigated and the skin was closed with staples The patient was then extubated and brought to the recovery room in stable condition. Sponge, lap, and needle counts were correct at closure and at the conclusion of the case.               Caroleen Hamman, MD, FACS

## 2016-04-30 NOTE — Anesthesia Postprocedure Evaluation (Signed)
Anesthesia Post Note  Patient: Victoria Dalton  Procedure(s) Performed: Procedure(s) (LRB): CHOLECYSTECTOMY (N/A) INTRAOPERATIVE CHOLANGIOGRAM  Patient location during evaluation: PACU Anesthesia Type: General Level of consciousness: awake Pain management: pain level controlled Vital Signs Assessment: post-procedure vital signs reviewed and stable Respiratory status: spontaneous breathing Cardiovascular status: stable Anesthetic complications: no    Last Vitals:  Vitals:   04/30/16 0751 04/30/16 1204  BP: (!) 170/90 (!) 146/62  Pulse: 60 66  Resp: 20 19  Temp: 36.6 C 36.6 C    Last Pain:  Vitals:   04/30/16 1204  TempSrc:   PainSc: Asleep                 VAN STAVEREN,Raneisha Bress

## 2016-04-30 NOTE — Anesthesia Preprocedure Evaluation (Signed)
Anesthesia Evaluation  Patient identified by MRN, date of birth, ID band Patient awake    Reviewed: Allergy & Precautions, NPO status , Patient's Chart, lab work & pertinent test results  Airway Mallampati: II       Dental  (+) Teeth Intact   Pulmonary shortness of breath,     + decreased breath sounds      Cardiovascular Exercise Tolerance: Good hypertension, Pt. on home beta blockers  Rhythm:Regular Rate:Normal     Neuro/Psych negative neurological ROS  negative psych ROS   GI/Hepatic Neg liver ROS, GERD  Medicated,  Endo/Other  diabetes, Well Controlled, Type 2, Oral Hypoglycemic Agents  Renal/GU      Musculoskeletal   Abdominal (+) + obese,   Peds  Hematology negative hematology ROS (+)   Anesthesia Other Findings   Reproductive/Obstetrics                             Anesthesia Physical Anesthesia Plan  ASA: III  Anesthesia Plan: General   Post-op Pain Management:    Induction: Intravenous  Airway Management Planned: Natural Airway and Nasal Cannula  Additional Equipment:   Intra-op Plan:   Post-operative Plan: Extubation in OR  Informed Consent: I have reviewed the patients History and Physical, chart, labs and discussed the procedure including the risks, benefits and alternatives for the proposed anesthesia with the patient or authorized representative who has indicated his/her understanding and acceptance.     Plan Discussed with: CRNA  Anesthesia Plan Comments:         Anesthesia Quick Evaluation

## 2016-04-30 NOTE — Interval H&P Note (Signed)
History and Physical Interval Note:  04/30/2016 8:40 AM  Victoria Dalton  has presented today for surgery, with the diagnosis of Chronic cholecystitis  The various methods of treatment have been discussed with the patient and family. After consideration of risks, benefits and other options for treatment, the patient has consented to  Procedure(s): CHOLECYSTECTOMY (N/A) as a surgical intervention .  The patient's history has been reviewed, patient examined, no change in status, stable for surgery.  I have reviewed the patient's chart and labs.  Questions were answered to the patient's satisfaction.     [place]

## 2016-04-30 NOTE — H&P (View-Only) (Signed)
Patient ID: Victoria Dalton, female   DOB: 07-18-1930, 82 y.o.   MRN: [id number]  HPI Victoria Dalton is a 82 y.o. female f/u after  dx laparoscopy and biopsies showed no evidence of malignancy. Repeat CT scan showed evidence of chronic cholecystitis but no evidence of either gallbladder or liver mass 9 personally reviewed, stent in place and CBD only mildly dilated. The patient is feeling well and her only recent complaint was left submandibular swelling and pain. She reports that her symptoms are getting better and she had recently had an some dental treatment performed. She is able to eat and does not have any abdominal pain or complication for her diagnostic laparoscopy. No evidence of obstructive jaundice or cholangitis.  HPI  Past Medical History:  Diagnosis Date  . Diabetes mellitus without complication ([place])   . Hyperlipidemia   . Hypertension   . Kidney stone   . UTI (lower urinary tract infection)     Past Surgical History:  Procedure Laterality Date  . DIAGNOSTIC LAPAROSCOPIC LIVER BIOPSY  02/28/2016   Procedure: DIAGNOSTIC LAPAROSCOPIC LIVER BIOPSY;  Surgeon: Jules Husbands, MD;  Location: [facility] ORS;  Service: General;;  . ERCP N/A 01/13/2016   Procedure: ENDOSCOPIC RETROGRADE CHOLANGIOPANCREATOGRAPHY (ERCP);  Surgeon: Lucilla Lame, MD;  Location: [facility] ENDOSCOPY;  Service: Endoscopy;  Laterality: N/A;  . KNEE ARTHROSCOPY Left   . LAPAROSCOPY  02/28/2016   Procedure: Diagnotic laparoscopy with omental biopsy;  Surgeon: Jules Husbands, MD;  Location: [facility] ORS;  Service: General;;  . TONSILLECTOMY      Family History  Problem Relation Age of Onset  . Cancer Father   . Heart attack Father     Social History Social History  Substance Use Topics  . Smoking status: Never Smoker  . Smokeless tobacco: Never Used  . Alcohol use No    Allergies  Allergen Reactions  . Chlorhexidine Itching  . [place] Extract]   . Ramipril Other (See Comments)   IRREGULAR HEART BEAT  . Penicillins Rash    Has patient had a PCN reaction causing immediate rash, facial/tongue/throat swelling, SOB or lightheadedness with hypotension: Yes Has patient had a PCN reaction causing severe rash involving mucus membranes or skin necrosis: Yes Has patient had a PCN reaction that required hospitalization No Has patient had a PCN reaction occurring within the last 10 years: No If all of the above answers are "NO", then may proceed with Cephalosporin use.   . Sulfa Antibiotics Rash    Current Outpatient Prescriptions  Medication Sig Dispense Refill  . glimepiride (AMARYL) 2 MG tablet Take 1 mg by mouth every morning.     Marland Kitchen losartan (COZAAR) 100 MG tablet Take 100 mg by mouth daily.    . metoprolol succinate (TOPROL-XL) 100 MG 24 hr tablet Take 100 mg by mouth daily.     . Multiple Vitamins-Minerals (PRESERVISION AREDS PO) Take 1 capsule by mouth 2 (two) times daily.     Marland Kitchen nystatin (MYCOSTATIN) 100000 UNIT/ML suspension Take 15 mLs by mouth 3 (three) times daily.    . ONE TOUCH ULTRA TEST test strip   0  . pantoprazole (PROTONIX) 40 MG tablet Take 1 tablet (40 mg total) by mouth daily. 30 tablet 6  . simvastatin (ZOCOR) 40 MG tablet Take 40 mg by mouth at bedtime.  0  . sitaGLIPtin (JANUVIA) 50 MG tablet Take 50 mg by mouth daily.     No current facility-administered medications for this visit.  Review of Systems A 10 point review of systems was asked and was negative except for the information on the HPI  Physical Exam Blood pressure (!) 162/78, pulse (!) 59, temperature 98.8 F (37.1 C), temperature source Oral, height 5\' 9"  (1.753 m), weight 102.7 kg (226 lb 6.4 oz). CONSTITUTIONAL: NAD EYES: Pupils are equal, round, and reactive to light, Sclera are non-icteric. EARS, NOSE, MOUTH AND THROAT: The oropharynx is clear. The oral mucosa is pink and moist. Hearing is intact to voice. LYMPH NODES:  Lymph nodes in the neck are normal. RESPIRATORY:  Lungs  are clear. There is normal respiratory effort, with equal breath sounds bilaterally, and without pathologic use of accessory muscles. CARDIOVASCULAR: Heart is regular without murmurs, gallops, or rubs. GI: The abdomen is soft, nontender, and nondistended. There are no palpable masses. There is no hepatosplenomegaly. There are normal bowel sounds in all quadrants. Laparoscopic scars healing well, no infection. GU: Rectal deferred.   MUSCULOSKELETAL: Normal muscle strength and tone. No cyanosis or edema.   SKIN: Turgor is good and there are no pathologic skin lesions or ulcers. NEUROLOGIC: Motor and sensation is grossly normal. Cranial nerves are grossly intact. PSYCH:  Oriented to person, place and time. Affect is normal.  Data Reviewed  I have personally reviewed the patient's imaging, laboratory findings and medical records.    Assessment/ Plan Complicated case of chronic cholecystitis with a  questionable gallbladder mass. We have done everything from a diagnostic perspective to prove there is no gallbladder malignancy. Given previous laparoscopic findings and evidence of inflammation I think she is better served with an open cholecystectomy. Discussed with the patient in detail about my thought process. We will wait until she completes her ERCP and stent removal by Dr.Wohl. D/w her in detail about the risks of surgery, benefits and possible complications ( hearing but not limited to: Bleeding, infection, re-interventions, common bile duct exploration and common bile duct injury). She understands and wishes to proceed.   Caroleen Hamman, MD FACS General Surgeon 04/02/2016, 10:01 AM

## 2016-04-30 NOTE — Transfer of Care (Signed)
Immediate Anesthesia Transfer of Care Note  Patient: Victoria Dalton  Procedure(s) Performed: Procedure(s): CHOLECYSTECTOMY (N/A) INTRAOPERATIVE CHOLANGIOGRAM  Patient Location: PACU  Anesthesia Type:General  Level of Consciousness: sedated  Airway & Oxygen Therapy: Patient Spontanous Breathing and Patient connected to face mask oxygen  Post-op Assessment: Report given to RN and Post -op Vital signs reviewed and stable  Post vital signs: Reviewed and stable  Last Vitals:  Vitals:   04/30/16 0751  BP: (!) 170/90  Pulse: 60  Resp: 20  Temp: 36.6 C    Last Pain:  Vitals:   04/30/16 0751  TempSrc: Oral         Complications: No apparent anesthesia complications

## 2016-04-30 NOTE — Progress Notes (Signed)
Okay to D/c fluids with dextrose and make maintenance fluids LR at 48ml/hr

## 2016-05-01 LAB — CBC
HCT: 34.9 % — ABNORMAL LOW (ref 35.0–47.0)
Hemoglobin: 11.7 g/dL — ABNORMAL LOW (ref 12.0–16.0)
MCH: 29 pg (ref 26.0–34.0)
MCHC: 33.5 g/dL (ref 32.0–36.0)
MCV: 86.5 fL (ref 80.0–100.0)
Platelets: 153 10*3/uL (ref 150–440)
RBC: 4.03 MIL/uL (ref 3.80–5.20)
RDW: 14.6 % — ABNORMAL HIGH (ref 11.5–14.5)
WBC: 15.5 10*3/uL — ABNORMAL HIGH (ref 3.6–11.0)

## 2016-05-01 LAB — COMPREHENSIVE METABOLIC PANEL
ALT: 64 U/L — ABNORMAL HIGH (ref 14–54)
AST: 65 U/L — ABNORMAL HIGH (ref 15–41)
Albumin: 3.3 g/dL — ABNORMAL LOW (ref 3.5–5.0)
Alkaline Phosphatase: 104 U/L (ref 38–126)
Anion gap: 5 (ref 5–15)
BUN: 22 mg/dL — ABNORMAL HIGH (ref 6–20)
CO2: 26 mmol/L (ref 22–32)
Calcium: 8.6 mg/dL — ABNORMAL LOW (ref 8.9–10.3)
Chloride: 104 mmol/L (ref 101–111)
Creatinine, Ser: 1.05 mg/dL — ABNORMAL HIGH (ref 0.44–1.00)
GFR calc Af Amer: 55 mL/min — ABNORMAL LOW (ref >60)
GFR calc non Af Amer: 48 mL/min — ABNORMAL LOW (ref >60)
Glucose, Bld: 135 mg/dL — ABNORMAL HIGH (ref 65–99)
Potassium: 4.2 mmol/L (ref 3.5–5.1)
Sodium: 135 mmol/L (ref 135–145)
Total Bilirubin: 0.9 mg/dL (ref 0.3–1.2)
Total Protein: 5.7 g/dL — ABNORMAL LOW (ref 6.5–8.1)

## 2016-05-01 LAB — SURGICAL PATHOLOGY

## 2016-05-01 LAB — GLUCOSE, CAPILLARY: Glucose-Capillary: 120 mg/dL — ABNORMAL HIGH (ref 65–99)

## 2016-05-01 MED ORDER — HYDROCODONE-ACETAMINOPHEN 5-325 MG PO TABS
1.0000 | ORAL_TABLET | Freq: Four times a day (QID) | ORAL | 0 refills | Status: DC | PRN
Start: 2016-05-01 — End: 2016-05-08

## 2016-05-01 NOTE — Progress Notes (Signed)
Patient A&O, VSS.  Dressings intact.  JP drain with minimal serosanguinous drainage.  Discharge instructions reviewed with patient and daughter.  JP maintenance and draining taught in detail to patient and daughter; written instructions provided as well.  Understanding was verbalized and all questions were answered.  Patient discharged home via wheelchair in stable condition escorted by volunteers.

## 2016-05-01 NOTE — Care Management (Signed)
Patient admitted status post  Open Cholecystectomy  Per Nursing bedside nurse patient is mobile and tolerating diet.  Patient has JP drain in place.  Bedside RN to instruct patient and family on care.  RNCM signing off

## 2016-05-01 NOTE — Discharge Summary (Signed)
Patient ID: Victoria Dalton MRN: [id number] DOB/AGE: Jun 03, 1930 82 y.o.  Admit date: 04/30/2016 Discharge date: 05/01/2016   Discharge Diagnoses:  Active Problems:   Cholecystitis   Procedures:Open chole w drain placement  Hospital Course: 82-year-old with a history of chronic cholecystitis and questionable gallbladder mass. She was scheduled for an open cholecystectomy due to the fact that previously and laparoscopically was extremely difficult. She underwent an open cholecystectomy showing severe adhesions that were able to perform a cholecystectomy with cholangiogram in a safe fashion. And she was kept overnight and she did great postoperatively. At the time of discharge she was ambulating, she was taking regular diet. Her vital signs were stable and she was afebrile. Her physical exam reveals a female in no acute distress, awake, alert. Abdomen: Soft. Incision healing well without evidence of infection. JP was only serous fluid. No peritonitis. Extremities: Well perfused and warm to touch. Condition of the patient and time of discharge is stable   Disposition: 01-Home or Self Care  Discharge Instructions    Call MD for:  difficulty breathing, headache or visual disturbances    Complete by:  As directed    Call MD for:  extreme fatigue    Complete by:  As directed    Call MD for:  hives    Complete by:  As directed    Call MD for:  persistant dizziness or light-headedness    Complete by:  As directed    Call MD for:  persistant nausea and vomiting    Complete by:  As directed    Call MD for:  redness, tenderness, or signs of infection (pain, swelling, redness, odor or green/yellow discharge around incision site)    Complete by:  As directed    Call MD for:  severe uncontrolled pain    Complete by:  As directed    Call MD for:  temperature >100.4    Complete by:  As directed    Diet - low sodium heart healthy    Complete by:  As directed    Discharge instructions    Complete  by:  As directed    Please teach pt about JP care, shower tomorrow   Increase activity slowly    Complete by:  As directed    Lifting restrictions    Complete by:  As directed    20 lbs x 6 weeks       Medication List    TAKE these medications   acetaminophen 500 MG tablet Commonly known as:  TYLENOL Take 500 mg by mouth every 6 (six) hours as needed for mild pain.   calcium carbonate 500 MG chewable tablet Commonly known as:  TUMS - dosed in mg elemental calcium Chew 2 tablets by mouth daily as needed for indigestion or heartburn.   COZAAR 100 MG tablet Generic drug:  losartan Take 100 mg by mouth daily.   glimepiride 2 MG tablet Commonly known as:  AMARYL Take 1 mg by mouth every morning.   HYDROcodone-acetaminophen 5-325 MG tablet Commonly known as:  NORCO/VICODIN Take 1 tablet by mouth every 6 (six) hours as needed for moderate pain. What changed:  Another medication with the same name was added. Make sure you understand how and when to take each.   HYDROcodone-acetaminophen 5-325 MG tablet Commonly known as:  NORCO/VICODIN Take 1-2 tablets by mouth every 6 (six) hours as needed for moderate pain. What changed:  You were already taking a medication with the same name, and this prescription  was added. Make sure you understand how and when to take each.   metoprolol succinate 100 MG 24 hr tablet Commonly known as:  TOPROL-XL Take 100 mg by mouth daily.   ONE TOUCH ULTRA TEST test strip Generic drug:  glucose blood   pantoprazole 40 MG tablet Commonly known as:  PROTONIX Take 1 tablet (40 mg total) by mouth daily.   PRESERVISION AREDS PO Take 1 capsule by mouth 2 (two) times daily.   simvastatin 40 MG tablet Commonly known as:  ZOCOR Take 40 mg by mouth at bedtime.   sitaGLIPtin 50 MG tablet Commonly known as:  JANUVIA Take 50 mg by mouth daily.      Follow-up Information    Jules Husbands, MD. Go on 05/08/2016.   Specialty:  General Surgery Why:   Thursday at 1:30pm for hospital follow-up Contact information: [address] [place] [id number] [phone]            Caroleen Hamman, MD FACS

## 2016-05-08 ENCOUNTER — Encounter: Payer: Self-pay | Admitting: Surgery

## 2016-05-08 ENCOUNTER — Ambulatory Visit (INDEPENDENT_AMBULATORY_CARE_PROVIDER_SITE_OTHER): Payer: Medicare Other | Admitting: Surgery

## 2016-05-08 VITALS — BP 119/74 | HR 69 | Temp 98.3°F | Wt 235.0 lb

## 2016-05-08 DIAGNOSIS — Z09 Encounter for follow-up examination after completed treatment for conditions other than malignant neoplasm: Secondary | ICD-10-CM

## 2016-05-08 NOTE — Patient Instructions (Signed)

## 2016-05-08 NOTE — Progress Notes (Signed)
S/p open chole 12/6 Path XANTHOGRANULOMATOUS CHOLECYSTITIS  D/w pt  Doing well, taking Po, pain is controlled Minimal output from JP (serous)  PE NAD Abd: soft, staples removed and JP removed. No infection.   A/p doing well No heavy lifting Low fat diet No complications RTC prn

## 2016-06-05 ENCOUNTER — Emergency Department
Admission: EM | Admit: 2016-06-05 | Discharge: 2016-06-05 | Disposition: A | Discharge status: Home / Self Care | Payer: Medicare Other | Attending: Emergency Medicine | Admitting: Emergency Medicine

## 2016-06-05 ENCOUNTER — Encounter: Payer: Self-pay | Admitting: Occupational Medicine

## 2016-06-05 ENCOUNTER — Emergency Department: Payer: Medicare Other

## 2016-06-05 DIAGNOSIS — Z7984 Long term (current) use of oral hypoglycemic drugs: Secondary | ICD-10-CM | POA: Insufficient documentation

## 2016-06-05 DIAGNOSIS — I10 Essential (primary) hypertension: Secondary | ICD-10-CM | POA: Diagnosis not present

## 2016-06-05 DIAGNOSIS — R112 Nausea with vomiting, unspecified: Secondary | ICD-10-CM | POA: Diagnosis not present

## 2016-06-05 DIAGNOSIS — R197 Diarrhea, unspecified: Secondary | ICD-10-CM

## 2016-06-05 DIAGNOSIS — Z79899 Other long term (current) drug therapy: Secondary | ICD-10-CM | POA: Insufficient documentation

## 2016-06-05 DIAGNOSIS — E119 Type 2 diabetes mellitus without complications: Secondary | ICD-10-CM | POA: Insufficient documentation

## 2016-06-05 DIAGNOSIS — Z7982 Long term (current) use of aspirin: Secondary | ICD-10-CM | POA: Diagnosis not present

## 2016-06-05 DIAGNOSIS — H524 Presbyopia: Secondary | ICD-10-CM | POA: Diagnosis not present

## 2016-06-05 DIAGNOSIS — K529 Noninfective gastroenteritis and colitis, unspecified: Secondary | ICD-10-CM | POA: Diagnosis not present

## 2016-06-05 DIAGNOSIS — H353132 Nonexudative age-related macular degeneration, bilateral, intermediate dry stage: Secondary | ICD-10-CM | POA: Diagnosis not present

## 2016-06-05 DIAGNOSIS — R55 Syncope and collapse: Secondary | ICD-10-CM

## 2016-06-05 DIAGNOSIS — H2513 Age-related nuclear cataract, bilateral: Secondary | ICD-10-CM | POA: Diagnosis not present

## 2016-06-05 DIAGNOSIS — H18423 Band keratopathy, bilateral: Secondary | ICD-10-CM | POA: Diagnosis not present

## 2016-06-05 LAB — URINALYSIS, COMPLETE (UACMP) WITH MICROSCOPIC
Bacteria, UA: NONE SEEN
Bilirubin Urine: NEGATIVE
Glucose, UA: NEGATIVE mg/dL
Hgb urine dipstick: NEGATIVE
Ketones, ur: NEGATIVE mg/dL
Nitrite: NEGATIVE
Protein, ur: NEGATIVE mg/dL
Specific Gravity, Urine: 1.014 (ref 1.005–1.030)
pH: 5 (ref 5.0–8.0)

## 2016-06-05 LAB — LIPASE, BLOOD: Lipase: 32 U/L (ref 11–51)

## 2016-06-05 LAB — BASIC METABOLIC PANEL
Anion gap: 6 (ref 5–15)
BUN: 20 mg/dL (ref 6–20)
CO2: 27 mmol/L (ref 22–32)
Calcium: 9 mg/dL (ref 8.9–10.3)
Chloride: 106 mmol/L (ref 101–111)
Creatinine, Ser: 1.07 mg/dL — ABNORMAL HIGH (ref 0.44–1.00)
GFR calc Af Amer: 54 mL/min — ABNORMAL LOW (ref >60)
GFR calc non Af Amer: 47 mL/min — ABNORMAL LOW (ref >60)
Glucose, Bld: 127 mg/dL — ABNORMAL HIGH (ref 65–99)
Potassium: 4.5 mmol/L (ref 3.5–5.1)
Sodium: 139 mmol/L (ref 135–145)

## 2016-06-05 LAB — CBC
HCT: 40.6 % (ref 35.0–47.0)
Hemoglobin: 13.6 g/dL (ref 12.0–16.0)
MCH: 29.8 pg (ref 26.0–34.0)
MCHC: 33.5 g/dL (ref 32.0–36.0)
MCV: 89.1 fL (ref 80.0–100.0)
Platelets: 158 10*3/uL (ref 150–440)
RBC: 4.55 MIL/uL (ref 3.80–5.20)
RDW: 14.5 % (ref 11.5–14.5)
WBC: 20.1 10*3/uL — ABNORMAL HIGH (ref 3.6–11.0)

## 2016-06-05 LAB — TROPONIN I: Troponin I: <0.03 ng/mL (ref <0.03)

## 2016-06-05 MED ORDER — SODIUM CHLORIDE 0.9 % IV BOLUS (SEPSIS)
1000.0000 mL | Freq: Once | INTRAVENOUS | Status: AC
  Administered 2016-06-05: 1000 mL via INTRAVENOUS

## 2016-06-05 MED ORDER — ONDANSETRON HCL 4 MG PO TABS: 4.0000 mg | ORAL_TABLET | Freq: Three times a day (TID) | ORAL | 0 refills | Status: DC | PRN

## 2016-06-05 MED ORDER — ONDANSETRON HCL 4 MG/2ML IJ SOLN
4.0000 mg | Freq: Once | INTRAMUSCULAR | Status: AC
  Administered 2016-06-05: 4 mg via INTRAVENOUS
  Filled 2016-06-05: qty 2

## 2016-06-05 NOTE — ED Notes (Signed)
Pt resting in bed, eyes are closed, respirations even and unlabored. Denies any needs at this time. Will continue to monitor for further patient needs. Pt's family at bedside. This RN apologized for and explained delay to patient's family. Pt's family states understanding at this time.

## 2016-06-05 NOTE — ED Notes (Signed)
This RN to bedside to assist patient with ambulating to the bathroom. Pt c/o feeling weak at this time but is able to ambulate with assistance to the bathroom. Pt tolerated going to the bathroom well.

## 2016-06-05 NOTE — ED Notes (Signed)
Pt visualized in NAD at this time. Pt continues to receive 2nd liter bolus of fluids. Pt family remains at bedside. Will continue to monitor for further patient needs.

## 2016-06-05 NOTE — ED Notes (Signed)
Per daughter patient passed out again in front of ems after vomiting.

## 2016-06-05 NOTE — ED Provider Notes (Signed)
[facility] Emergency Department Provider Note  ____________________________________________  Time seen: Approximately 7:45 AM  I have reviewed the triage vital signs and the nursing notes.   HISTORY  Chief Complaint Loss of Consciousness and Emesis   HPI Victoria Dalton is a 83 y.o. female history of diabetes, hypertension, hyperlipidemia who presents for evaluation of a syncopal episode. Patient reports that she woke up at 4 AM with vomiting and diarrhea. She has had several episodes of nonbloody nonbilious emesis and watery diarrhea. No melena, hematemesis, hematochezia, chills, fever, abdominal pain. Patient reports that she was in the bathroom having a bowel movement and she noticed that the trashcan was far away. She got up and went to get the trash can. She was over the trash can and vomiting when she felt like she was going to pass out. She then woke up on the floor with vomit around her. Patient denies headache, neck pain, back pain, chest pain, abdominal pain, pain in her extremities. She is not any blood thinners. She reports that she feels very weak and dehydrated.  Past Medical History:  Diagnosis Date  . Diabetes mellitus without complication ([place])   . Dyspnea   . Gallbladder calculus   . GERD (gastroesophageal reflux disease)   . Hyperlipidemia   . Hypertension   . Kidney stone   . Mass   . Parotid swelling   . UTI (lower urinary tract infection)     Patient Active Problem List   Diagnosis Date Noted  . Blood in stool   . Gallbladder mass   . Cholangitis   . Cholecystitis   . Right upper quadrant pain   . Hyperbilirubinemia   . Protein-calorie malnutrition, severe 01/11/2016  . Cholecystitis with cholelithiasis 01/10/2016  . Benign essential HTN 12/06/2015  . HLD (hyperlipidemia) 05/23/2015  . Gastro-esophageal reflux disease without esophagitis 05/08/2014  . Controlled type 2 diabetes mellitus without complication ([place])  [id number]  . Type 2 diabetes mellitus without complication ([place]) [id number]    Past Surgical History:  Procedure Laterality Date  . CHOLECYSTECTOMY N/A 04/30/2016   Procedure: CHOLECYSTECTOMY;  Surgeon: Jules Husbands, MD;  Location: [facility] ORS;  Service: General;  Laterality: N/A;  . DIAGNOSTIC LAPAROSCOPIC LIVER BIOPSY  02/28/2016   Procedure: DIAGNOSTIC LAPAROSCOPIC LIVER BIOPSY;  Surgeon: Jules Husbands, MD;  Location: [facility] ORS;  Service: General;;  . ERCP N/A 01/13/2016   Procedure: ENDOSCOPIC RETROGRADE CHOLANGIOPANCREATOGRAPHY (ERCP);  Surgeon: Lucilla Lame, MD;  Location: [facility] ENDOSCOPY;  Service: Endoscopy;  Laterality: N/A;  . ERCP N/A 04/08/2016   Procedure: ENDOSCOPIC RETROGRADE CHOLANGIOPANCREATOGRAPHY (ERCP) Stent removal;  Surgeon: Lucilla Lame, MD;  Location: [facility] ENDOSCOPY;  Service: Endoscopy;  Laterality: N/A;  . INTRAOPERATIVE CHOLANGIOGRAM  04/30/2016   Procedure: INTRAOPERATIVE CHOLANGIOGRAM;  Surgeon: Jules Husbands, MD;  Location: [facility] ORS;  Service: General;;  . KNEE ARTHROSCOPY Left   . LAPAROSCOPY  02/28/2016   Procedure: Diagnotic laparoscopy with omental biopsy;  Surgeon: Jules Husbands, MD;  Location: [facility] ORS;  Service: General;;  . TONSILLECTOMY      Prior to Admission medications   Medication Sig Start Date End Date Taking? Authorizing Provider  aspirin EC 81 MG tablet Take 81 mg by mouth daily.   Yes Historical Provider, MD  glimepiride (AMARYL) 2 MG tablet Take 1 mg by mouth every morning.  12/22/14  Yes Historical Provider, MD  losartan (COZAAR) 100 MG tablet Take 100 mg by mouth daily.   Yes Historical Provider, MD  metoprolol succinate (  TOPROL-XL) 100 MG 24 hr tablet Take 100 mg by mouth daily.  11/06/15  Yes Historical Provider, MD  Multiple Vitamins-Minerals (PRESERVISION AREDS PO) Take 1 capsule by mouth 2 (two) times daily.    Yes Historical Provider, MD  pantoprazole (PROTONIX) 40 MG tablet Take 1 tablet (40 mg total) by mouth daily. 12/06/15  Yes Lucilla Lame,  MD  simvastatin (ZOCOR) 40 MG tablet Take 40 mg by mouth at bedtime. 10/12/15  Yes Historical Provider, MD  sitaGLIPtin (JANUVIA) 50 MG tablet Take 50 mg by mouth daily.   Yes Historical Provider, MD  acetaminophen (TYLENOL) 500 MG tablet Take 500 mg by mouth every 6 (six) hours as needed for mild pain.    Historical Provider, MD  calcium carbonate (TUMS - DOSED IN MG ELEMENTAL CALCIUM) 500 MG chewable tablet Chew 2 tablets by mouth daily as needed for indigestion or heartburn.    Historical Provider, MD  ondansetron (ZOFRAN) 4 MG tablet Take 1 tablet (4 mg total) by mouth every 8 (eight) hours as needed for nausea or vomiting. 06/05/16   Rudene Re, MD  ONE TOUCH ULTRA TEST test strip  11/02/15   Historical Provider, MD    Allergies Chlorhexidine; Poison oak extract [poison oak extract]; Ramipril; Penicillins; and Sulfa antibiotics  Family History  Problem Relation Age of Onset  . Cancer Father   . Heart attack Father   . Cancer Brother     Social History Social History  Substance Use Topics  . Smoking status: Never Smoker  . Smokeless tobacco: Never Used  . Alcohol use No    Review of Systems  Constitutional: Negative for fever. + syncope Eyes: Negative for visual changes. ENT: Negative for sore throat. Neck: No neck pain  Cardiovascular: Negative for chest pain. Respiratory: Negative for shortness of breath. Gastrointestinal: Negative for abdominal pain. + vomiting and diarrhea. Genitourinary: Negative for dysuria. Musculoskeletal: Negative for back pain. Skin: Negative for rash. Neurological: Negative for headaches, weakness or numbness. Psych: No SI or HI  ____________________________________________   PHYSICAL EXAM:  VITAL SIGNS: ED Triage Vitals  Enc Vitals Group     BP 06/05/16 0642 (!) 159/92     Pulse Rate 06/05/16 0642 68     Resp 06/05/16 0642 18     Temp 06/05/16 0642 98 F (36.7 C)     Temp Source 06/05/16 0642 Oral     SpO2 06/05/16 0632 94 %       Weight 06/05/16 0643 237 lb 9.6 oz (107.8 kg)     Height 06/05/16 0643 5\' 9"  (1.753 m)     Head Circumference --      Peak Flow --      Pain Score --      Pain Loc --      Pain Edu? --      Excl. in [place]? --     Constitutional: Alert and oriented. Well appearing and in no apparent distress. HEENT:      Head: Normocephalic and atraumatic.         Eyes: Conjunctivae are normal. Sclera is non-icteric. EOMI. PERRL      Mouth/Throat: Mucous membranes are moist.       Neck: Supple with no signs of meningismus.No C-spine tenderness Cardiovascular: Regular rate and rhythm. No murmurs, gallops, or rubs. 2+ symmetrical distal pulses are present in all extremities. No JVD. Respiratory: Normal respiratory effort. Lungs are clear to auscultation bilaterally. No wheezes, crackles, or rhonchi.  Gastrointestinal: Soft, non tender, and non distended  with positive bowel sounds. No rebound or guarding. Genitourinary: No CVA tenderness. Musculoskeletal: Nontender with normal range of motion in all extremities. No edema, cyanosis, or erythema of extremities. No tenderness in tenderness Neurologic: Normal speech and language. Face is symmetric. Moving all extremities. No gross focal neurologic deficits are appreciated. Skin: Skin is warm, dry and intact. No rash noted. Psychiatric: Mood and affect are normal. Speech and behavior are normal.  ____________________________________________   LABS (all labs ordered are listed, but only abnormal results are displayed)  Labs Reviewed  BASIC METABOLIC PANEL - Abnormal; Notable for the following:       Result Value   Glucose, Bld 127 (*)    Creatinine, Ser 1.07 (*)    GFR calc non Af Amer 47 (*)    GFR calc Af Amer 54 (*)    All other components within normal limits  CBC - Abnormal; Notable for the following:    WBC 20.1 (*)    All other components within normal limits  URINALYSIS, COMPLETE (UACMP) WITH MICROSCOPIC - Abnormal; Notable for the following:     Color, Urine YELLOW (*)    APPearance CLEAR (*)    Leukocytes, UA TRACE (*)    Squamous Epithelial / LPF 0-5 (*)    All other components within normal limits  TROPONIN I  LIPASE, BLOOD  CBG MONITORING, ED   ____________________________________________  EKG  ED ECG REPORT I, Rudene Re, the attending physician, personally viewed and interpreted this ECG.  Normal sinus rhythm, rate of 64, normal intervals, normal axis, no ST elevations or depressions. Unchanged from prior  ____________________________________________  RADIOLOGY  Head CT: Negative  ____________________________________________   PROCEDURES  Procedure(s) performed: None Procedures Critical Care performed:  None ____________________________________________   INITIAL IMPRESSION / ASSESSMENT AND PLAN / ED COURSE  83 y.o. female history of diabetes, hypertension, hyperlipidemia who presents for evaluation of a syncopal episode in the setting of multiple episodes of watery diarrhea nonbloody nonbilious emesis. Patient's vital signs are within normal limits. Physical exam with no acute findings. We'll check electrolytes, kidney function, EKG and troponin, head CT is negative, for any acute injuries. We'll give IV fluids and IV Zofran for gastroenteritis.  Clinical Course    Patient received 2 L of normal saline and feels markedly improved. She is tolerating by mouth. No longer having vomiting and diarrhea in the emergency room. Negative orthostatic vital signs after fluids. Patient is ambulating without difficulty. Urinalysis with no evidence of infection. CBC shows leukocytosis with a white count of 20K. Patient remained with no abdominal pain, no tenderness on serial exam. CMP, lipase, troponin all WNL. Head CT with no acute findings. Will dc home on zofran, close follow-up with primary care doctor, increase oral hydration, I also recommended that patient returns to the emergency room if she spikes a fever or  if she develops abdominal pain.  Pertinent labs & imaging results that were available during my care of the patient were reviewed by me and considered in my medical decision making (see chart for details).    ____________________________________________   FINAL CLINICAL IMPRESSION(S) / ED DIAGNOSES  Final diagnoses:  Nausea vomiting and diarrhea  Syncope, unspecified syncope type  Gastroenteritis      NEW MEDICATIONS STARTED DURING THIS VISIT:  New Prescriptions   ONDANSETRON (ZOFRAN) 4 MG TABLET    Take 1 tablet (4 mg total) by mouth every 8 (eight) hours as needed for nausea or vomiting.     Note:  This document was  prepared using Systems analyst and may include unintentional dictation errors.    Rudene Re, MD 06/05/16 1420

## 2016-06-05 NOTE — ED Notes (Signed)
Report given to Amy, RN.

## 2016-06-05 NOTE — ED Notes (Signed)
NAD Noted at this time. Pt resting in bed with family at bedside at this time. Denies any needs at this time. Will continue to monitor for further patient needs. This RN explained and apologized for delay to patient, patient states understanding at this time. VSS and WNL.

## 2016-06-05 NOTE — ED Triage Notes (Addendum)
Pt presents via ems from home with nvd since 4am 3-4 times vomiting and diarrhea. Pt report calling ems for public assist from falling after passing out into floor.  Pt on [place]. Unknown if hit head. EMS helped get up out of floor. Pt denies any pain. Pt denies any nasuea just having burping. Pt had gallbladder removed Dec6.

## 2016-06-05 NOTE — ED Notes (Signed)
Pt given a cup of water with MD permission. Will continue to monitor for further patient needs at this time.

## 2016-06-05 NOTE — ED Notes (Signed)
Patient transported to CT 

## 2016-06-05 NOTE — ED Notes (Signed)
Pt resting in bed, eyes are noted to be closed, respirations even and unlabored. Pt's family remains at bedside at this time. Will continue to monitor for further patient needs.

## 2016-06-10 DIAGNOSIS — E119 Type 2 diabetes mellitus without complications: Secondary | ICD-10-CM | POA: Diagnosis not present

## 2016-06-10 DIAGNOSIS — A09 Infectious gastroenteritis and colitis, unspecified: Secondary | ICD-10-CM | POA: Diagnosis not present

## 2016-06-10 DIAGNOSIS — R12 Heartburn: Secondary | ICD-10-CM | POA: Diagnosis not present

## 2016-06-10 DIAGNOSIS — E785 Hyperlipidemia, unspecified: Secondary | ICD-10-CM | POA: Diagnosis not present

## 2016-07-08 ENCOUNTER — Other Ambulatory Visit: Payer: Self-pay | Admitting: Gastroenterology

## 2016-07-30 ENCOUNTER — Ambulatory Visit
Admission: RE | Admit: 2016-07-30 | Discharge: 2016-07-30 | Disposition: A | Discharge status: Home / Self Care | Payer: Medicare Other | Source: Ambulatory Visit | Attending: Internal Medicine | Admitting: Internal Medicine

## 2016-07-30 DIAGNOSIS — Z1231 Encounter for screening mammogram for malignant neoplasm of breast: Secondary | ICD-10-CM | POA: Insufficient documentation

## 2016-07-31 ENCOUNTER — Telehealth: Payer: Self-pay

## 2016-07-31 NOTE — Telephone Encounter (Signed)
I spoke with Lic at [place] regarding The reconsideration request Letter received per [facility] office) and she stated [id number] has been paid to DR.Pabon for CPT [id number]. She stated someone by the name Coralyn Mark from [facility] had faxed in additional medical information for services rendered at the facility. No further action needed on our part.

## 2016-08-08 DIAGNOSIS — E119 Type 2 diabetes mellitus without complications: Secondary | ICD-10-CM | POA: Diagnosis not present

## 2016-08-08 DIAGNOSIS — T360X5S Adverse effect of penicillins, sequela: Secondary | ICD-10-CM | POA: Diagnosis not present

## 2016-08-08 DIAGNOSIS — I10 Essential (primary) hypertension: Secondary | ICD-10-CM | POA: Diagnosis not present

## 2016-08-08 DIAGNOSIS — E785 Hyperlipidemia, unspecified: Secondary | ICD-10-CM | POA: Diagnosis not present

## 2016-11-04 DIAGNOSIS — E119 Type 2 diabetes mellitus without complications: Secondary | ICD-10-CM | POA: Diagnosis not present

## 2016-11-04 DIAGNOSIS — E785 Hyperlipidemia, unspecified: Secondary | ICD-10-CM | POA: Diagnosis not present

## 2016-11-04 DIAGNOSIS — I10 Essential (primary) hypertension: Secondary | ICD-10-CM | POA: Diagnosis not present

## 2017-01-24 ENCOUNTER — Other Ambulatory Visit: Payer: Self-pay | Admitting: Gastroenterology

## 2017-03-16 ENCOUNTER — Emergency Department: Payer: Medicare Other

## 2017-03-16 ENCOUNTER — Emergency Department
Admission: EM | Admit: 2017-03-16 | Discharge: 2017-03-16 | Disposition: A | Discharge status: Home / Self Care | Payer: Medicare Other | Attending: Emergency Medicine | Admitting: Emergency Medicine

## 2017-03-16 DIAGNOSIS — N3001 Acute cystitis with hematuria: Secondary | ICD-10-CM | POA: Insufficient documentation

## 2017-03-16 DIAGNOSIS — I10 Essential (primary) hypertension: Secondary | ICD-10-CM | POA: Diagnosis not present

## 2017-03-16 DIAGNOSIS — R310 Gross hematuria: Secondary | ICD-10-CM | POA: Insufficient documentation

## 2017-03-16 DIAGNOSIS — Z7984 Long term (current) use of oral hypoglycemic drugs: Secondary | ICD-10-CM | POA: Insufficient documentation

## 2017-03-16 DIAGNOSIS — E119 Type 2 diabetes mellitus without complications: Secondary | ICD-10-CM | POA: Diagnosis not present

## 2017-03-16 DIAGNOSIS — Z7982 Long term (current) use of aspirin: Secondary | ICD-10-CM | POA: Diagnosis not present

## 2017-03-16 DIAGNOSIS — K5730 Diverticulosis of large intestine without perforation or abscess without bleeding: Secondary | ICD-10-CM | POA: Diagnosis not present

## 2017-03-16 DIAGNOSIS — R300 Dysuria: Secondary | ICD-10-CM | POA: Diagnosis not present

## 2017-03-16 DIAGNOSIS — Z79899 Other long term (current) drug therapy: Secondary | ICD-10-CM | POA: Insufficient documentation

## 2017-03-16 DIAGNOSIS — R319 Hematuria, unspecified: Secondary | ICD-10-CM | POA: Diagnosis present

## 2017-03-16 LAB — URINALYSIS, COMPLETE (UACMP) WITH MICROSCOPIC
Bacteria, UA: NONE SEEN
Specific Gravity, Urine: 1.02 (ref 1.005–1.030)
Squamous Epithelial / LPF: NONE SEEN

## 2017-03-16 LAB — CBC WITH DIFFERENTIAL/PLATELET
Basophils Absolute: 0.1 10*3/uL (ref 0–0.1)
Basophils Relative: 1 %
Eosinophils Absolute: 0.1 10*3/uL (ref 0–0.7)
Eosinophils Relative: 1 %
HCT: 43.1 % (ref 35.0–47.0)
Hemoglobin: 14.2 g/dL (ref 12.0–16.0)
Lymphocytes Relative: 11 %
Lymphs Abs: 1.4 10*3/uL (ref 1.0–3.6)
MCH: 30.4 pg (ref 26.0–34.0)
MCHC: 33 g/dL (ref 32.0–36.0)
MCV: 92 fL (ref 80.0–100.0)
Monocytes Absolute: 0.6 10*3/uL (ref 0.2–0.9)
Monocytes Relative: 5 %
Neutro Abs: 10.3 10*3/uL — ABNORMAL HIGH (ref 1.4–6.5)
Neutrophils Relative %: 82 %
Platelets: 186 10*3/uL (ref 150–440)
RBC: 4.68 MIL/uL (ref 3.80–5.20)
RDW: 13.3 % (ref 11.5–14.5)
WBC: 12.5 10*3/uL — ABNORMAL HIGH (ref 3.6–11.0)

## 2017-03-16 LAB — COMPREHENSIVE METABOLIC PANEL
ALT: 14 U/L (ref 14–54)
AST: 21 U/L (ref 15–41)
Albumin: 4.1 g/dL (ref 3.5–5.0)
Alkaline Phosphatase: 130 U/L — ABNORMAL HIGH (ref 38–126)
Anion gap: 5 (ref 5–15)
BUN: 20 mg/dL (ref 6–20)
CO2: 29 mmol/L (ref 22–32)
Calcium: 9.4 mg/dL (ref 8.9–10.3)
Chloride: 105 mmol/L (ref 101–111)
Creatinine, Ser: 1.2 mg/dL — ABNORMAL HIGH (ref 0.44–1.00)
GFR calc Af Amer: 47 mL/min — ABNORMAL LOW (ref >60)
GFR calc non Af Amer: 40 mL/min — ABNORMAL LOW (ref >60)
Glucose, Bld: 135 mg/dL — ABNORMAL HIGH (ref 65–99)
Potassium: 4.7 mmol/L (ref 3.5–5.1)
Sodium: 139 mmol/L (ref 135–145)
Total Bilirubin: 0.8 mg/dL (ref 0.3–1.2)
Total Protein: 7.4 g/dL (ref 6.5–8.1)

## 2017-03-16 LAB — LIPASE, BLOOD: Lipase: 40 U/L (ref 11–51)

## 2017-03-16 MED ORDER — OXYCODONE-ACETAMINOPHEN 5-325 MG PO TABS
1.0000 | ORAL_TABLET | Freq: Once | ORAL | Status: AC
  Administered 2017-03-16: 1 via ORAL
  Filled 2017-03-16: qty 1

## 2017-03-16 MED ORDER — SODIUM CHLORIDE 0.9 % IV BOLUS (SEPSIS)
1000.0000 mL | Freq: Once | INTRAVENOUS | Status: AC
  Administered 2017-03-16: 1000 mL via INTRAVENOUS

## 2017-03-16 MED ORDER — PHENAZOPYRIDINE HCL 100 MG PO TABS: 100.0000 mg | ORAL_TABLET | Freq: Three times a day (TID) | ORAL | 0 refills | Status: AC | PRN

## 2017-03-16 MED ORDER — CIPROFLOXACIN HCL 500 MG PO TABS: 500.0000 mg | ORAL_TABLET | Freq: Two times a day (BID) | ORAL | 0 refills | Status: DC

## 2017-03-16 MED ORDER — CIPROFLOXACIN HCL 500 MG PO TABS
500.0000 mg | ORAL_TABLET | Freq: Once | ORAL | Status: AC
Start: 2017-03-16 — End: 2017-03-16
  Administered 2017-03-16: 500 mg via ORAL
  Filled 2017-03-16: qty 1

## 2017-03-16 NOTE — ED Notes (Signed)
Pt sitting on toilet att; pt reports bloody clots when urinating and pain, started at 0300 today, daughter at bedside

## 2017-03-16 NOTE — Discharge Instructions (Signed)
Please follow up with urology for evaluation of your hematuria

## 2017-03-16 NOTE — ED Triage Notes (Signed)
Patient reports symptoms began approx 2 hours ago.  Reports burning and pain with urination.

## 2017-03-16 NOTE — ED Provider Notes (Signed)
[facility] Emergency Department Provider Note   ____________________________________________   First MD Initiated Contact with Patient 03/16/17 [phone]     (approximate)  I have reviewed the triage vital signs and the nursing notes.   HISTORY  Chief Complaint Dysuria    HPI Victoria Dalton is a 83 y.o. female who comes into the hospital today with hematuria with clots. The patient states it started at 3 AM. She called her daughter and told her that she needed to come in.the patient reports that she's been shaking and having some burning and pain with urination. She reports that she has had kidney stones in the past but she denies any back pain at this time. The patient has pelvic and genital pain. She denies any nausea or vomiting no dizziness or lightheadedness. She reports that she was taking due to the pain but the shaking has improved. She has not had any fevers and did well today. The patient rates her pain as 8 out of 10 in intensity. She does endorse some urgency.   Past Medical History:  Diagnosis Date  . Diabetes mellitus without complication ([place])   . Dyspnea   . Gallbladder calculus   . GERD (gastroesophageal reflux disease)   . Hyperlipidemia   . Hypertension   . Kidney stone   . Mass   . Parotid swelling   . UTI (lower urinary tract infection)     Patient Active Problem List   Diagnosis Date Noted  . Blood in stool   . Gallbladder mass   . Cholangitis   . Cholecystitis   . Right upper quadrant pain   . Hyperbilirubinemia   . Protein-calorie malnutrition, severe 01/11/2016  . Cholecystitis with cholelithiasis 01/10/2016  . Benign essential HTN 12/06/2015  . HLD (hyperlipidemia) 05/23/2015  . Gastro-esophageal reflux disease without esophagitis 05/08/2014  . Controlled type 2 diabetes mellitus without complication ([place]) 29/47/6546  . Type 2 diabetes mellitus without complication ([place]) 50/35/4656    Past Surgical History:    Procedure Laterality Date  . CHOLECYSTECTOMY N/A 04/30/2016   Procedure: CHOLECYSTECTOMY;  Surgeon: Jules Husbands, MD;  Location: [facility] ORS;  Service: General;  Laterality: N/A;  . DIAGNOSTIC LAPAROSCOPIC LIVER BIOPSY  02/28/2016   Procedure: DIAGNOSTIC LAPAROSCOPIC LIVER BIOPSY;  Surgeon: Jules Husbands, MD;  Location: [facility] ORS;  Service: General;;  . ERCP N/A 01/13/2016   Procedure: ENDOSCOPIC RETROGRADE CHOLANGIOPANCREATOGRAPHY (ERCP);  Surgeon: Lucilla Lame, MD;  Location: [facility] ENDOSCOPY;  Service: Endoscopy;  Laterality: N/A;  . ERCP N/A 04/08/2016   Procedure: ENDOSCOPIC RETROGRADE CHOLANGIOPANCREATOGRAPHY (ERCP) Stent removal;  Surgeon: Lucilla Lame, MD;  Location: [facility] ENDOSCOPY;  Service: Endoscopy;  Laterality: N/A;  . INTRAOPERATIVE CHOLANGIOGRAM  04/30/2016   Procedure: INTRAOPERATIVE CHOLANGIOGRAM;  Surgeon: Jules Husbands, MD;  Location: [facility] ORS;  Service: General;;  . KNEE ARTHROSCOPY Left   . LAPAROSCOPY  02/28/2016   Procedure: Diagnotic laparoscopy with omental biopsy;  Surgeon: Jules Husbands, MD;  Location: [facility] ORS;  Service: General;;  . TONSILLECTOMY      Prior to Admission medications   Medication Sig Start Date End Date Taking? Authorizing Provider  acetaminophen (TYLENOL) 500 MG tablet Take 500 mg by mouth every 6 (six) hours as needed for mild pain.   Yes [provider]  aspirin EC 81 MG tablet Take 81 mg by mouth daily.   Yes [provider]  calcium carbonate (TUMS - DOSED IN MG ELEMENTAL CALCIUM) 500 MG chewable tablet Chew 2 tablets by  mouth daily as needed for indigestion or heartburn.   Yes [provider]  glimepiride (AMARYL) 2 MG tablet Take 1 mg by mouth every morning.  12/22/14  Yes [provider]  losartan (COZAAR) 100 MG tablet Take 100 mg by mouth daily.   Yes [provider]  metoprolol succinate (TOPROL-XL) 100 MG 24 hr tablet Take 100 mg by mouth daily.  11/06/15  Yes [provider]  Multiple  Vitamins-Minerals (PRESERVISION AREDS PO) Take 1 capsule by mouth 2 (two) times daily.    Yes [provider]  ondansetron (ZOFRAN) 4 MG tablet Take 1 tablet (4 mg total) by mouth every 8 (eight) hours as needed for nausea or vomiting. 06/05/16  Yes Alfred Levins, [place], MD  ONE TOUCH ULTRA TEST test strip  11/02/15  Yes [provider]  pantoprazole (PROTONIX) 40 MG tablet take 1 tablet by mouth once daily 07/09/16  Yes Lucilla Lame, MD  simvastatin (ZOCOR) 40 MG tablet Take 40 mg by mouth at bedtime. 10/12/15  Yes [provider]  sitaGLIPtin (JANUVIA) 50 MG tablet Take 25 mg by mouth daily.    Yes [provider]  ciprofloxacin (CIPRO) 500 MG tablet Take 1 tablet (500 mg total) by mouth 2 (two) times daily. 03/16/17   Loney Hering, MD  phenazopyridine (PYRIDIUM) 100 MG tablet Take 1 tablet (100 mg total) by mouth 3 (three) times daily as needed for pain. 03/16/17 03/16/18  Loney Hering, MD    Allergies Chlorhexidine; Poison oak extract [poison oak extract]; Ramipril; Penicillins; and Sulfa antibiotics  Family History  Problem Relation Age of Onset  . Cancer Father   . Heart attack Father   . Cancer Brother     Social History Social History  Substance Use Topics  . Smoking status: Never Smoker  . Smokeless tobacco: Never Used  . Alcohol use No    Review of Systems  Constitutional: No fever/chills Eyes: No visual changes. ENT: No sore throat. Cardiovascular: Denies chest pain. Respiratory: Denies shortness of breath. Gastrointestinal: No abdominal pain.  No nausea, no vomiting.  No diarrhea.  No constipation. Genitourinary: he material and dysuria, pelvic pain Musculoskeletal: Negative for back pain. Skin: Negative for rash. Neurological: Negative for headaches, focal weakness or numbness.   ____________________________________________   PHYSICAL EXAM:  VITAL SIGNS: ED Triage Vitals  Enc Vitals Group     BP 03/16/17 0514 (!)  146/74     Pulse Rate 03/16/17 0514 (!) 56     Resp 03/16/17 0514 20     Temp 03/16/17 0514 98.8 F (37.1 C)     Temp Source 03/16/17 0514 Oral     SpO2 03/16/17 0514 95 %     Weight 03/16/17 0455 243 lb (110.2 kg)     Height 03/16/17 0455 5\' 8"  (1.727 m)     Head Circumference --      [place] --      Pain Score 03/16/17 0514 10     Pain Loc --      Pain Edu? --      Excl. in [place]? --     Constitutional: Alert and oriented. Well appearing and in mild distress. Eyes: Conjunctivae are normal. PERRL. EOMI. Head: Atraumatic. Nose: No congestion/rhinnorhea. Mouth/Throat: Mucous membranes are moist.  Oropharynx non-erythematous. Cardiovascular: Normal rate, regular rhythm. Grossly normal heart sounds.  Good peripheral circulation. Respiratory: Normal respiratory effort.  No retractions. Lungs CTAB. Gastrointestinal: Soft and nontender. No distention. positive bowel sounds Musculoskeletal: No lower extremity  tenderness nor edema.   Neurologic:  Normal speech and language.  Skin:  Skin is warm, dry and intact.  Psychiatric: Mood and affect are normal.   ____________________________________________   LABS (all labs ordered are listed, but only abnormal results are displayed)  Labs Reviewed  URINALYSIS, COMPLETE (UACMP) WITH MICROSCOPIC - Abnormal; Notable for the following:       Result Value   Color, Urine RED (*)    APPearance TURBID (*)    Glucose, UA   (*)    Value: TEST NOT REPORTED DUE TO COLOR INTERFERENCE OF URINE PIGMENT   Hgb urine dipstick   (*)    Value: TEST NOT REPORTED DUE TO COLOR INTERFERENCE OF URINE PIGMENT   Bilirubin Urine   (*)    Value: TEST NOT REPORTED DUE TO COLOR INTERFERENCE OF URINE PIGMENT   Ketones, ur   (*)    Value: TEST NOT REPORTED DUE TO COLOR INTERFERENCE OF URINE PIGMENT   Protein, ur   (*)    Value: TEST NOT REPORTED DUE TO COLOR INTERFERENCE OF URINE PIGMENT   Nitrite   (*)    Value: TEST NOT REPORTED DUE TO COLOR INTERFERENCE OF URINE  PIGMENT   Leukocytes, UA   (*)    Value: TEST NOT REPORTED DUE TO COLOR INTERFERENCE OF URINE PIGMENT   All other components within normal limits  COMPREHENSIVE METABOLIC PANEL - Abnormal; Notable for the following:    Glucose, Bld 135 (*)    Creatinine, Ser 1.20 (*)    Alkaline Phosphatase 130 (*)    GFR calc non Af Amer 40 (*)    GFR calc Af Amer 47 (*)    All other components within normal limits  CBC WITH DIFFERENTIAL/PLATELET - Abnormal; Notable for the following:    WBC 12.5 (*)    Neutro Abs 10.3 (*)    All other components within normal limits  LIPASE, BLOOD   ____________________________________________  EKG  none ____________________________________________  RADIOLOGY  Ct Renal Stone Study  Result Date: 03/16/2017 CLINICAL DATA:  Acute onset of dysuria. Bloody clots in urine. Initial encounter. EXAM: CT ABDOMEN AND PELVIS WITHOUT CONTRAST TECHNIQUE: Multidetector CT imaging of the abdomen and pelvis was performed following the standard protocol without IV contrast. COMPARISON:  MRI of the abdomen performed 01/11/2016, and CT of the abdomen and pelvis from 03/18/2016 FINDINGS: Lower chest: Scattered coronary artery calcifications are seen. A tiny hiatal hernia is noted. The visualized lung bases are grossly clear. Hepatobiliary: The liver is unremarkable in appearance. The patient is status post cholecystectomy. The common bile duct remains normal in caliber. Pancreas: The pancreas is within normal limits. Spleen: The spleen is unremarkable in appearance. Adrenals/Urinary Tract: The adrenal glands are unremarkable in appearance. The kidneys are within normal limits. There is no evidence of hydronephrosis. No renal or ureteral stones are identified. No perinephric stranding is seen. Set Stomach/Bowel: The stomach is unremarkable in appearance. The small bowel is within normal limits. The appendix is normal in caliber, without evidence of appendicitis. Mild diffuse diverticulosis  is noted along the entirety of the colon, without evidence of diverticulitis. Vascular/Lymphatic: Minimal calcification is seen along the abdominal aorta and its branches. No retroperitoneal or pelvic sidewall lymphadenopathy is seen. Reproductive: A 3.2 cm focus of high attenuation at the right side of the base of the bladder may reflect clot. Underlying mass cannot be excluded. The uterus is unremarkable in appearance. The ovaries are relatively symmetric. No suspicious adnexal masses are seen. Other: No  additional soft tissue abnormalities are seen. Musculoskeletal: No acute osseous abnormalities are identified. There is grade 1 anterolisthesis of L4 on L5, and of L5 on S1. Multilevel vacuum phenomenon is noted along the lumbar spine, with underlying facet disease. The visualized musculature is unremarkable in appearance. IMPRESSION: 1. 3.2 cm focus of high attenuation at the right side of the base of the bladder may reflect clot. Underlying mass cannot be excluded. Cystoscopy could be considered for further evaluation, as deemed clinically appropriate. 2. Mild diffuse diverticulosis along the entirety of the colon, without evidence of diverticulitis. 3. Scattered coronary artery calcifications seen. 4. Tiny hiatal hernia noted. 5. Mild degenerative change along the lumbar spine. Electronically Signed   By: Garald Balding M.D.   On: 03/16/2017 07:00    ____________________________________________   PROCEDURES  Procedure(s) performed: None  Procedures  Critical Care performed: No  ____________________________________________   INITIAL IMPRESSION / ASSESSMENT AND PLAN / ED COURSE  As part of my medical decision making, I reviewed the following data within the electronic MEDICAL RECORD NUMBER Notes from prior ED visits and [place] Controlled Substance Database   this is an 83 year old female who comes into the hospital today with hematuria dysuria and urgency.  The patient's differential diagnosis  includes kidney stone, bladder infection, bladder mass, kidney mass.  We did check some blood work which was unremarkable. The patient's white blood cell count is 12.5. I gave the patient a liter of normal saline as well as a dose of Percocet. The patient's creatinine is 1.2. I sent her for a CT scan and she does have an area of increased attenuation in her bladder which is consistent with either clot or a mass. I contacted Dr. Junious Silk who was on-call for urology. I did suggest treating the patient for possible infection given her symptoms and having her follow-up in the office. The patient's vital signs are unremarkable. He agreed with that plan. I did encourage the patient to continue drinking at home and to follow up. The patient does not have a kidney stone. She will be discharged. I will give her dose of ciprofloxacin in the emergency department.      ____________________________________________   FINAL CLINICAL IMPRESSION(S) / ED DIAGNOSES  Final diagnoses:  Gross hematuria  Acute cystitis with hematuria      NEW MEDICATIONS STARTED DURING THIS VISIT:  New Prescriptions   CIPROFLOXACIN (CIPRO) 500 MG TABLET    Take 1 tablet (500 mg total) by mouth 2 (two) times daily.   PHENAZOPYRIDINE (PYRIDIUM) 100 MG TABLET    Take 1 tablet (100 mg total) by mouth 3 (three) times daily as needed for pain.     Note:  This document was prepared using Dragon voice recognition software and may include unintentional dictation errors.    Loney Hering, MD 03/16/17 [id number]

## 2017-03-19 ENCOUNTER — Encounter: Payer: Self-pay | Admitting: Urology

## 2017-03-19 ENCOUNTER — Ambulatory Visit (INDEPENDENT_AMBULATORY_CARE_PROVIDER_SITE_OTHER): Payer: Medicare Other | Admitting: Urology

## 2017-03-19 VITALS — BP 133/71 | HR 64 | Ht 68.0 in | Wt 248.0 lb

## 2017-03-19 DIAGNOSIS — R310 Gross hematuria: Secondary | ICD-10-CM | POA: Diagnosis not present

## 2017-03-19 NOTE — Progress Notes (Signed)
03/19/2017 3:13 PM   [name] 10-27-1930 [id number]  Referring provider: Cletis Athens, MD [address] [place], [place] [address]  Chief Complaint  Patient presents with  . Hematuria    New Patient    HPI: Victoria Dalton is an 83 y.o. female who presented to the [facility] ED on 03/16/2017 with gross hematuria and dysuria associated with pelvic pain.  She noted occasional clots.  Denied fever though had some chills.  Noted no nausea, vomiting.  A renal stone protocol CT of the abdomen pelvis was performed which showed normal-appearing kidneys without masses, calculi or hydronephrosis.  There was a 3 cm hyperattenuating area at the right bladder base.  Urine culture was not performed.  She was discharged on empiric oral ciprofloxacin with urology follow-up recommended.  Since her discharge she is feeling much better.  Her hematuria has resolved.  Her dysuria has significantly improved.  She does have a prior history of nephrolithiasis that has not required surgical intervention.   PMH: Past Medical History:  Diagnosis Date  . Diabetes mellitus without complication ([place])   . Dyspnea   . Gallbladder calculus   . GERD (gastroesophageal reflux disease)   . Hyperlipidemia   . Hypertension   . Kidney stone   . Mass   . Parotid swelling   . UTI (lower urinary tract infection)     Surgical History: Past Surgical History:  Procedure Laterality Date  . CHOLECYSTECTOMY N/A 04/30/2016   Procedure: CHOLECYSTECTOMY;  Surgeon: Jules Husbands, MD;  Location: [facility] ORS;  Service: General;  Laterality: N/A;  . DIAGNOSTIC LAPAROSCOPIC LIVER BIOPSY  02/28/2016   Procedure: DIAGNOSTIC LAPAROSCOPIC LIVER BIOPSY;  Surgeon: Jules Husbands, MD;  Location: [facility] ORS;  Service: General;;  . ERCP N/A 01/13/2016   Procedure: ENDOSCOPIC RETROGRADE CHOLANGIOPANCREATOGRAPHY (ERCP);  Surgeon: Lucilla Lame, MD;  Location: [facility] ENDOSCOPY;  Service: Endoscopy;  Laterality: N/A;  . ERCP N/A 04/08/2016    Procedure: ENDOSCOPIC RETROGRADE CHOLANGIOPANCREATOGRAPHY (ERCP) Stent removal;  Surgeon: Lucilla Lame, MD;  Location: [facility] ENDOSCOPY;  Service: Endoscopy;  Laterality: N/A;  . INTRAOPERATIVE CHOLANGIOGRAM  04/30/2016   Procedure: INTRAOPERATIVE CHOLANGIOGRAM;  Surgeon: Jules Husbands, MD;  Location: [facility] ORS;  Service: General;;  . KNEE ARTHROSCOPY Left   . LAPAROSCOPY  02/28/2016   Procedure: Diagnotic laparoscopy with omental biopsy;  Surgeon: Jules Husbands, MD;  Location: [facility] ORS;  Service: General;;  . TONSILLECTOMY      Home Medications:  Allergies as of 03/19/2017      Reactions   Chlorhexidine Itching   Poison Oak Extract [poison Oak Extract]    Ramipril Other (See Comments)   IRREGULAR HEART BEAT   Penicillins Rash   Has patient had a PCN reaction causing immediate rash, facial/tongue/throat swelling, SOB or lightheadedness with hypotension: Yes Has patient had a PCN reaction causing severe rash involving mucus membranes or skin necrosis: Yes Has patient had a PCN reaction that required hospitalization No Has patient had a PCN reaction occurring within the last 10 years: No If all of the above answers are "NO", then may proceed with Cephalosporin use.   Sulfa Antibiotics Rash      Medication List       Accurate as of 03/19/17  3:13 PM. Always use your most recent med list.          acetaminophen 500 MG tablet Commonly known as:  TYLENOL Take 500 mg by mouth every 6 (six) hours as needed for mild pain.   aspirin  EC 81 MG tablet Take 81 mg by mouth daily.   calcium carbonate 500 MG chewable tablet Commonly known as:  TUMS - dosed in mg elemental calcium Chew 2 tablets by mouth daily as needed for indigestion or heartburn.   ciprofloxacin 500 MG tablet Commonly known as:  CIPRO Take 1 tablet (500 mg total) by mouth 2 (two) times daily.   COZAAR 100 MG tablet Generic drug:  losartan Take 100 mg by mouth daily.   glimepiride 2 MG tablet Commonly known as:   AMARYL Take 1 mg by mouth every morning.   metoprolol succinate 100 MG 24 hr tablet Commonly known as:  TOPROL-XL Take 100 mg by mouth daily.   ondansetron 4 MG tablet Commonly known as:  ZOFRAN Take 1 tablet (4 mg total) by mouth every 8 (eight) hours as needed for nausea or vomiting.   ONE TOUCH ULTRA TEST test strip Generic drug:  glucose blood   pantoprazole 40 MG tablet Commonly known as:  PROTONIX take 1 tablet by mouth once daily   phenazopyridine 100 MG tablet Commonly known as:  PYRIDIUM Take 1 tablet (100 mg total) by mouth 3 (three) times daily as needed for pain.   PRESERVISION AREDS PO Take 1 capsule by mouth 2 (two) times daily.   simvastatin 40 MG tablet Commonly known as:  ZOCOR Take 40 mg by mouth at bedtime.   sitaGLIPtin 50 MG tablet Commonly known as:  JANUVIA Take 25 mg by mouth daily.       Allergies:  Allergies  Allergen Reactions  . Chlorhexidine Itching  . [place] Extract]   . Ramipril Other (See Comments)    IRREGULAR HEART BEAT  . Penicillins Rash    Has patient had a PCN reaction causing immediate rash, facial/tongue/throat swelling, SOB or lightheadedness with hypotension: Yes Has patient had a PCN reaction causing severe rash involving mucus membranes or skin necrosis: Yes Has patient had a PCN reaction that required hospitalization No Has patient had a PCN reaction occurring within the last 10 years: No If all of the above answers are "NO", then may proceed with Cephalosporin use.   . Sulfa Antibiotics Rash    Family History: Family History  Problem Relation Age of Onset  . Cancer Father   . Heart attack Father   . Cancer Brother     Social History:  reports that she has never smoked. She has never used smokeless tobacco. She reports that she does not drink alcohol or use drugs.  ROS: UROLOGY Frequent Urination?: Yes Hard to postpone urination?: No Burning/pain with urination?: Yes Get up at  night to urinate?: Yes Leakage of urine?: Yes Urine stream starts and stops?: No Trouble starting stream?: Yes Do you have to strain to urinate?: No Blood in urine?: Yes Urinary tract infection?: Yes Sexually transmitted disease?: No Injury to kidneys or bladder?: No Painful intercourse?: No Weak stream?: No Currently pregnant?: No Vaginal bleeding?: No Last menstrual period?: n  Gastrointestinal Nausea?: No Vomiting?: No Indigestion/heartburn?: No Diarrhea?: No Constipation?: Yes  Constitutional Fever: No Night sweats?: No Weight loss?: No Fatigue?: Yes  Skin Skin rash/lesions?: No Itching?: No  Eyes Blurred vision?: No Double vision?: No  Ears/Nose/Throat Sore throat?: No Sinus problems?: No  Hematologic/Lymphatic Swollen glands?: Yes Easy bruising?: No  Cardiovascular Leg swelling?: No Chest pain?: No  Respiratory Cough?: No Shortness of breath?: Yes  Endocrine Excessive thirst?: No  Musculoskeletal Back pain?: No Joint pain?: Yes  Neurological Headaches?: No  Dizziness?: Yes  Psychologic Depression?: Yes Anxiety?: Yes  Physical Exam: BP 133/71   Pulse 64   Ht 5\' 8"  (1.727 m)   Wt 248 lb (112.5 kg)   BMI 37.71 kg/m   Constitutional:  Alert and oriented, No acute distress. HEENT: [place] AT, moist mucus membranes.  Trachea midline, no masses. Cardiovascular: No clubbing, cyanosis, or edema. Respiratory: Normal respiratory effort, no increased work of breathing. GI: Abdomen is soft, nontender, nondistended, no abdominal masses GU: No CVA tenderness.  Skin: No rashes, bruises or suspicious lesions. Lymph: No cervical or inguinal adenopathy. Neurologic: Grossly intact, no focal deficits, moving all 4 extremities. Psychiatric: Normal mood and affect.  Laboratory Data: Lab Results  Component Value Date   WBC 12.5 (H) 03/16/2017   HGB 14.2 03/16/2017   HCT 43.1 03/16/2017   MCV 92.0 03/16/2017   PLT 186 03/16/2017    Lab Results    Component Value Date   CREATININE 1.20 (H) 03/16/2017    No results found for: PSA1  No results found for: TESTOSTERONE  Lab Results  Component Value Date   HGBA1C 6.0 01/12/2016    Urinalysis Microscopy negative  Lab Results  Component Value Date   APPEARANCEUR TURBID (A) 03/16/2017   LEUKOCYTESUR (A) 03/16/2017    TEST NOT REPORTED DUE TO COLOR INTERFERENCE OF URINE PIGMENT   PROTEINUR (A) 03/16/2017    TEST NOT REPORTED DUE TO COLOR INTERFERENCE OF URINE PIGMENT   GLUCOSEU (A) 03/16/2017    TEST NOT REPORTED DUE TO COLOR INTERFERENCE OF URINE PIGMENT   RBCU TOO NUMEROUS TO COUNT 03/16/2017   BILIRUBINUR (A) 03/16/2017    TEST NOT REPORTED DUE TO COLOR INTERFERENCE OF URINE PIGMENT   NITRITE (A) 03/16/2017    TEST NOT REPORTED DUE TO COLOR INTERFERENCE OF URINE PIGMENT     Pertinent Imaging: CT images were personally reviewed  Results for orders placed during the hospital encounter of 03/16/17  CT Renal Stone Study   Narrative CLINICAL DATA:  Acute onset of dysuria. Bloody clots in urine. Initial encounter.  EXAM: CT ABDOMEN AND PELVIS WITHOUT CONTRAST  TECHNIQUE: Multidetector CT imaging of the abdomen and pelvis was performed following the standard protocol without IV contrast.  COMPARISON:  MRI of the abdomen performed 01/11/2016, and CT of the abdomen and pelvis from 03/18/2016  FINDINGS: Lower chest: Scattered coronary artery calcifications are seen. A tiny hiatal hernia is noted. The visualized lung bases are grossly clear.  Hepatobiliary: The liver is unremarkable in appearance. The patient is status post cholecystectomy. The common bile duct remains normal in caliber.  Pancreas: The pancreas is within normal limits.  Spleen: The spleen is unremarkable in appearance.  Adrenals/Urinary Tract: The adrenal glands are unremarkable in appearance. The kidneys are within normal limits. There is no evidence of hydronephrosis. No renal or ureteral  stones are identified. No perinephric stranding is seen. Set  Stomach/Bowel: The stomach is unremarkable in appearance. The small bowel is within normal limits. The appendix is normal in caliber, without evidence of appendicitis.  Mild diffuse diverticulosis is noted along the entirety of the colon, without evidence of diverticulitis.  Vascular/Lymphatic: Minimal calcification is seen along the abdominal aorta and its branches. No retroperitoneal or pelvic sidewall lymphadenopathy is seen.  Reproductive: A 3.2 cm focus of high attenuation at the right side of the base of the bladder may reflect clot. Underlying mass cannot be excluded. The uterus is unremarkable in appearance. The ovaries are relatively symmetric. No suspicious adnexal masses are seen.  Other: No additional soft tissue abnormalities are seen.  Musculoskeletal: No acute osseous abnormalities are identified. There is grade 1 anterolisthesis of L4 on L5, and of L5 on S1. Multilevel vacuum phenomenon is noted along the lumbar spine, with underlying facet disease. The visualized musculature is unremarkable in appearance.  IMPRESSION: 1. 3.2 cm focus of high attenuation at the right side of the base of the bladder may reflect clot. Underlying mass cannot be excluded. Cystoscopy could be considered for further evaluation, as deemed clinically appropriate. 2. Mild diffuse diverticulosis along the entirety of the colon, without evidence of diverticulitis. 3. Scattered coronary artery calcifications seen. 4. Tiny hiatal hernia noted. 5. Mild degenerative change along the lumbar spine.   Electronically Signed   By: Garald Balding M.D.   On: 03/16/2017 07:00     Assessment & Plan:   1. Gross hematuria Resolved.  Most likely secondary to UTI however urine culture was not ordered.  The bladder mass seen on CT may represent clot.  Cystoscopy recommended for full evaluation of the lower urinary tract.  She is in  agreement and desires to schedule.  - Urinalysis, Complete   Abbie Sons, MD  [facility] [address] [place], [place] [address] [phone]

## 2017-03-20 LAB — URINALYSIS, COMPLETE
Bilirubin, UA: NEGATIVE
Glucose, UA: NEGATIVE
Ketones, UA: NEGATIVE
Nitrite, UA: NEGATIVE
Protein, UA: NEGATIVE
Specific Gravity, UA: 1.015 (ref 1.005–1.030)
Urobilinogen, Ur: 0.2 mg/dL (ref 0.2–1.0)
pH, UA: 5.5 (ref 5.0–7.5)

## 2017-03-25 ENCOUNTER — Ambulatory Visit: Payer: Medicare Other | Admitting: Urology

## 2017-03-25 ENCOUNTER — Encounter: Payer: Self-pay | Admitting: Urology

## 2017-03-25 VITALS — BP 143/74 | HR 58 | Ht 68.0 in | Wt 248.6 lb

## 2017-03-25 DIAGNOSIS — R310 Gross hematuria: Secondary | ICD-10-CM

## 2017-03-25 LAB — URINALYSIS, COMPLETE
Bilirubin, UA: NEGATIVE
Glucose, UA: NEGATIVE
Ketones, UA: NEGATIVE
Leukocytes, UA: NEGATIVE
Nitrite, UA: NEGATIVE
Protein, UA: NEGATIVE
Specific Gravity, UA: 1.02 (ref 1.005–1.030)
Urobilinogen, Ur: 0.2 mg/dL (ref 0.2–1.0)
pH, UA: 5.5 (ref 5.0–7.5)

## 2017-03-25 MED ORDER — LIDOCAINE HCL 2 % EX GEL: 1.0000 "application " | Freq: Once | CUTANEOUS | Status: DC

## 2017-03-25 MED ORDER — CIPROFLOXACIN HCL 500 MG PO TABS: 500.0000 mg | ORAL_TABLET | Freq: Once | ORAL | Status: DC

## 2017-03-25 NOTE — Progress Notes (Signed)
   03/25/17  CC:  Chief Complaint  Patient presents with  . Cysto    HPI: Gross hematuria with bladder mass.  See office note 10/25.  Denies recurrent gross hematuria.  Blood pressure (!) 143/74, pulse (!) 58, height 5\' 8"  (1.727 m), weight 248 lb 9.6 oz (112.8 kg). NED. A&Ox3.   No respiratory distress   Abd soft, NT, ND Normal external genitalia with patent urethral meatus  Cystoscopy Procedure Note  Patient identification was confirmed, informed consent was obtained, and patient was prepped using Betadine solution.  Lidocaine jelly was administered per urethral meatus.    Preoperative abx where received prior to procedure.    Procedure: - Flexible cystoscope introduced, without any difficulty.   - Thorough search of the bladder revealed:    normal urethral meatus    Diffuse resolving hemorrhagic areas consistent with resolving cystitis    no stones    no ulcers     no tumors-the 3 cm mass is not present    no urethral polyps    no trabeculation  - Ureteral orifices were normal in position and appearance.  Post-Procedure: - Patient tolerated the procedure well  Assessment/ Plan: Gross hematuria secondary to hemorrhagic cystitis.  CT finding not present on cystoscopy today and consistent with clot.  Follow-up prn recurrent hematuria or recurrent UTI    Abbie Sons, MD

## 2017-03-30 ENCOUNTER — Encounter: Payer: Self-pay | Admitting: Urology

## 2017-05-13 DIAGNOSIS — Z87448 Personal history of other diseases of urinary system: Secondary | ICD-10-CM | POA: Diagnosis not present

## 2017-05-13 DIAGNOSIS — I10 Essential (primary) hypertension: Secondary | ICD-10-CM | POA: Diagnosis not present

## 2017-05-13 DIAGNOSIS — T360X5S Adverse effect of penicillins, sequela: Secondary | ICD-10-CM | POA: Diagnosis not present

## 2017-05-13 DIAGNOSIS — E785 Hyperlipidemia, unspecified: Secondary | ICD-10-CM | POA: Diagnosis not present

## 2017-06-11 DIAGNOSIS — E785 Hyperlipidemia, unspecified: Secondary | ICD-10-CM | POA: Diagnosis not present

## 2017-06-11 DIAGNOSIS — Z87448 Personal history of other diseases of urinary system: Secondary | ICD-10-CM | POA: Diagnosis not present

## 2017-06-11 DIAGNOSIS — J209 Acute bronchitis, unspecified: Secondary | ICD-10-CM | POA: Diagnosis not present

## 2017-06-11 DIAGNOSIS — I10 Essential (primary) hypertension: Secondary | ICD-10-CM | POA: Diagnosis not present

## 2017-07-15 DIAGNOSIS — H524 Presbyopia: Secondary | ICD-10-CM | POA: Diagnosis not present

## 2017-07-15 DIAGNOSIS — H353132 Nonexudative age-related macular degeneration, bilateral, intermediate dry stage: Secondary | ICD-10-CM | POA: Diagnosis not present

## 2017-07-15 DIAGNOSIS — H25043 Posterior subcapsular polar age-related cataract, bilateral: Secondary | ICD-10-CM | POA: Diagnosis not present

## 2017-07-15 DIAGNOSIS — H2513 Age-related nuclear cataract, bilateral: Secondary | ICD-10-CM | POA: Diagnosis not present

## 2017-07-15 DIAGNOSIS — E119 Type 2 diabetes mellitus without complications: Secondary | ICD-10-CM | POA: Diagnosis not present

## 2017-08-12 DIAGNOSIS — E785 Hyperlipidemia, unspecified: Secondary | ICD-10-CM | POA: Diagnosis not present

## 2017-08-12 DIAGNOSIS — B3781 Candidal esophagitis: Secondary | ICD-10-CM | POA: Diagnosis not present

## 2017-08-12 DIAGNOSIS — I10 Essential (primary) hypertension: Secondary | ICD-10-CM | POA: Diagnosis not present

## 2017-08-12 DIAGNOSIS — B370 Candidal stomatitis: Secondary | ICD-10-CM | POA: Diagnosis not present

## 2017-08-13 ENCOUNTER — Other Ambulatory Visit: Payer: Self-pay | Admitting: Internal Medicine

## 2017-08-13 DIAGNOSIS — Z1231 Encounter for screening mammogram for malignant neoplasm of breast: Secondary | ICD-10-CM

## 2017-08-17 ENCOUNTER — Other Ambulatory Visit: Payer: Self-pay

## 2017-08-17 NOTE — Patient Outreach (Signed)
[facility]) Care Management  08/17/2017  LADYE MACNAUGHTON December 26, 1930 [id number]   Medication Adherence call to Mrs. Freddi Forster patient is showing past due under [place] health care Ins.on Losartan 100 mg patient said she pick  up on 08/13/17 for a 30 days supply but,was in need of Metoprolol 100 mg patient ask if we can call [facility] and order this medication for her,call pharmacy they said they will have it ready for patient.Mrs. Biasi said she will pick up after her dentist appointment the next day.  [place] Management Direct Dial [phone]  Fax [phone] [email or web address]

## 2017-08-18 IMAGING — RF DG ESOPHAGUS
6 of 7 series · 14 of 19 positions shown · non-contrast
Comparison: None.

CLINICAL DATA: Difficulty swallowing.  Unable to eat.

EXAM:
ESOPHOGRAM / BARIUM SWALLOW / BARIUM TABLET STUDY
TECHNIQUE: Combined double contrast and single contrast examination performed
using effervescent crystals, thick barium liquid, and thin barium
liquid. The patient was observed with fluoroscopy swallowing a 13 mm
barium sulphate tablet.
FLUOROSCOPY TIME:  Radiation Exposure Index (as provided by the
fluoroscopic device): 9.9 mGy

[Series 1: cp_standard · 0.51mm/px · 3 of 47 frames shown (1 of 6)]
[frame 8/47]
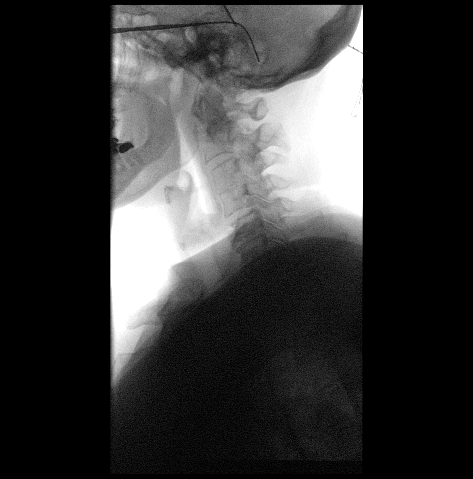
[frame 40/47]
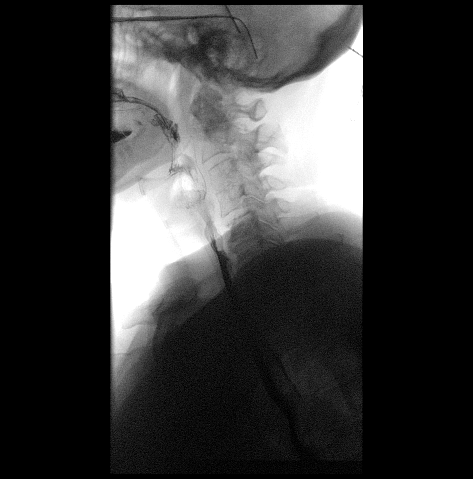
[frame 47/47]
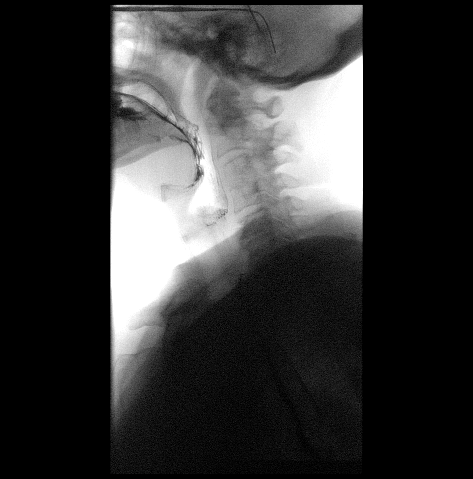

[Series 2: cp_standard · 0.51mm/px · 3 of 59 frames shown (2 of 6)]
[frame 9/59]
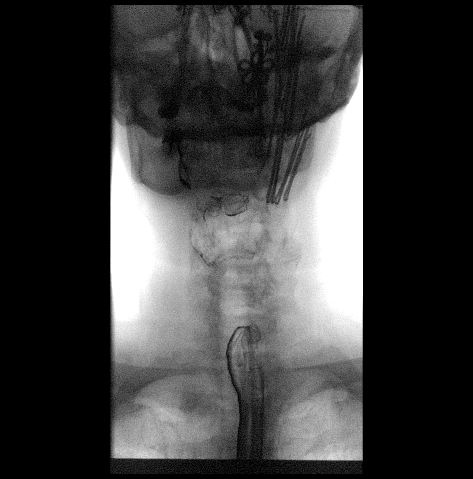
[frame 30/59]
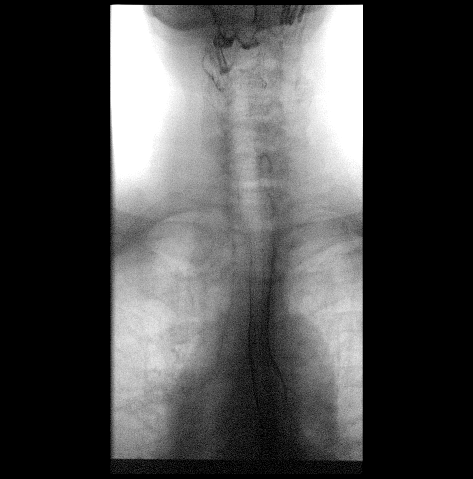
[frame 51/59]
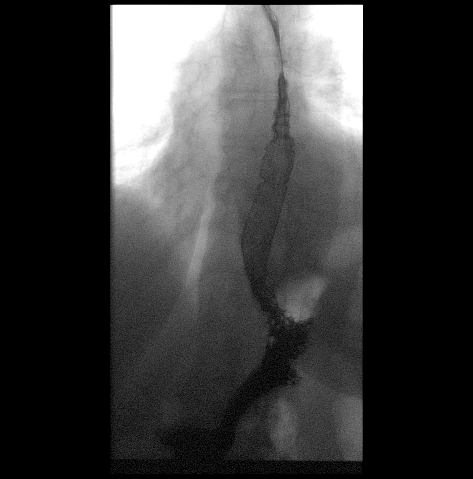

[Series 3: cp_standard · 0.51mm/px · 3 of 28 frames shown (3 of 6)]
[frame 5/28]
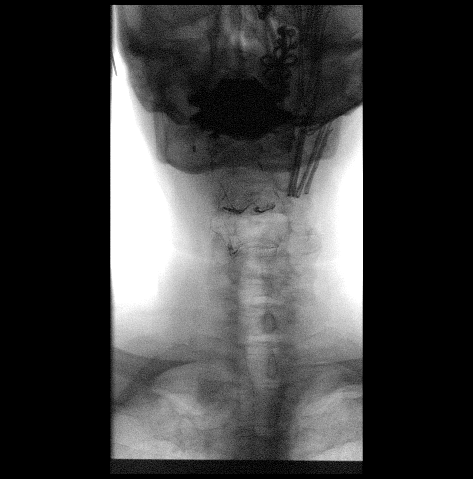
[frame 24/28]
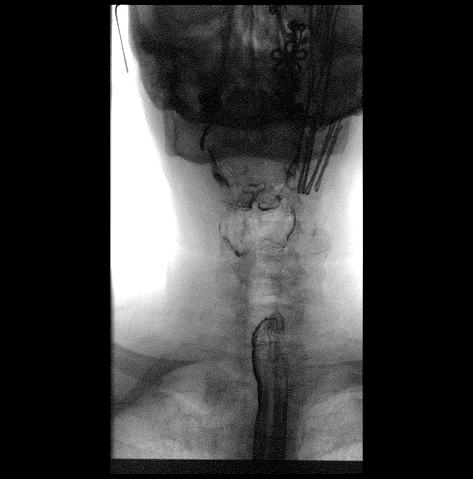
[frame 28/28]
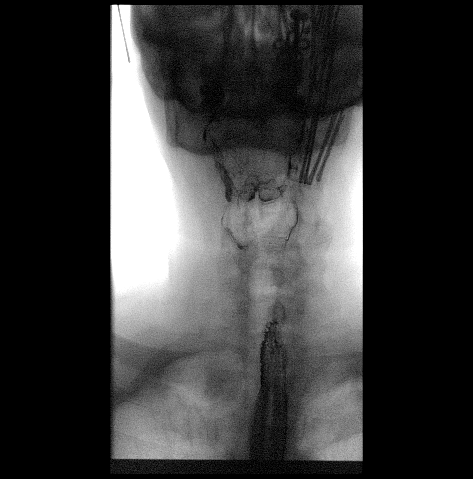

[Series 4: cp_standard · 0.25mm/px · 1 of 1 slices shown (4 of 6)]
[im 1/1]
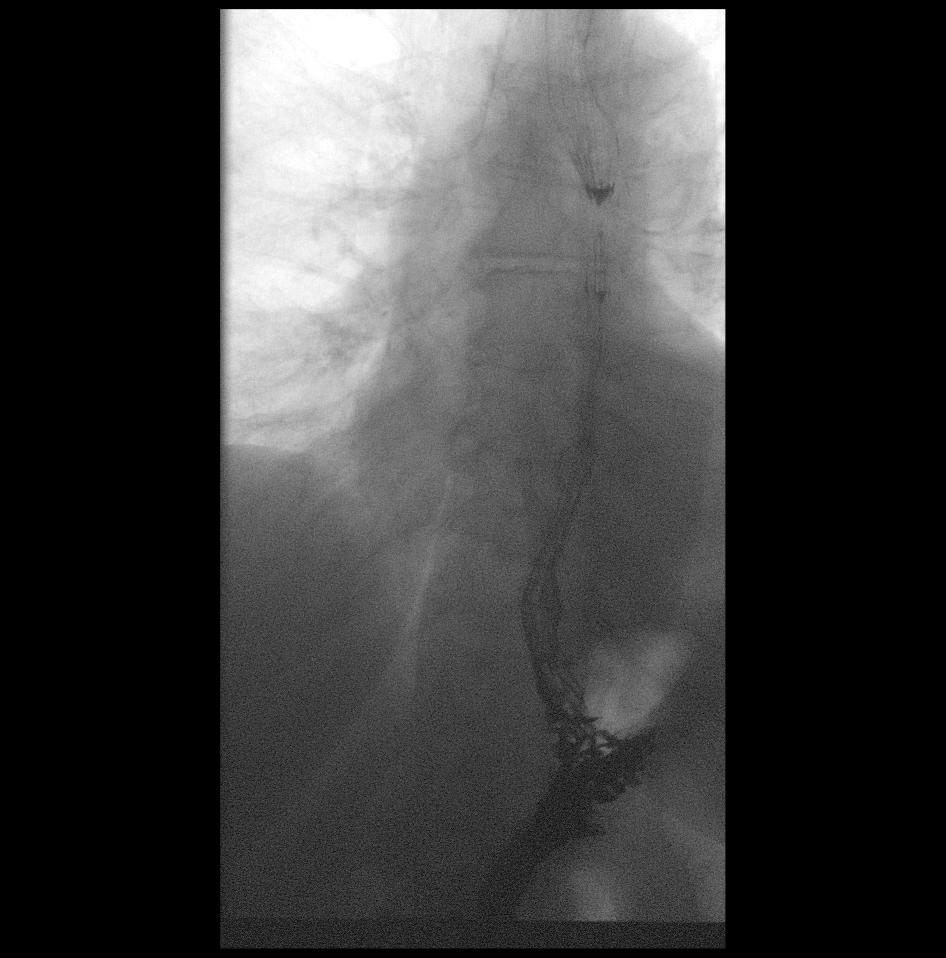

[Series 5: cp_standard · 0.51mm/px · 3 of 170 frames shown (5 of 6)]
[frame 86/170]
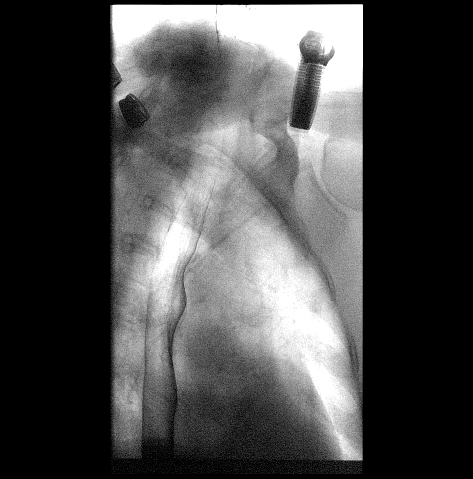
[frame 93/170]
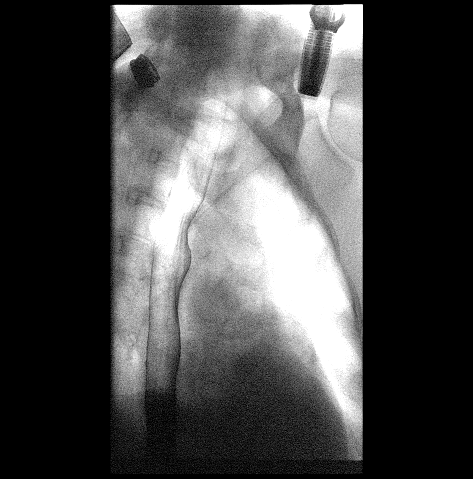
[frame 145/170]
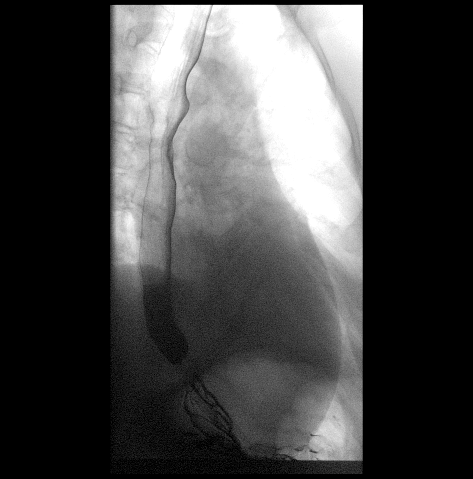

[Series 7: cp_standard · 0.25mm/px · 1 of 1 slices shown (6 of 6)]
[im 1/1]
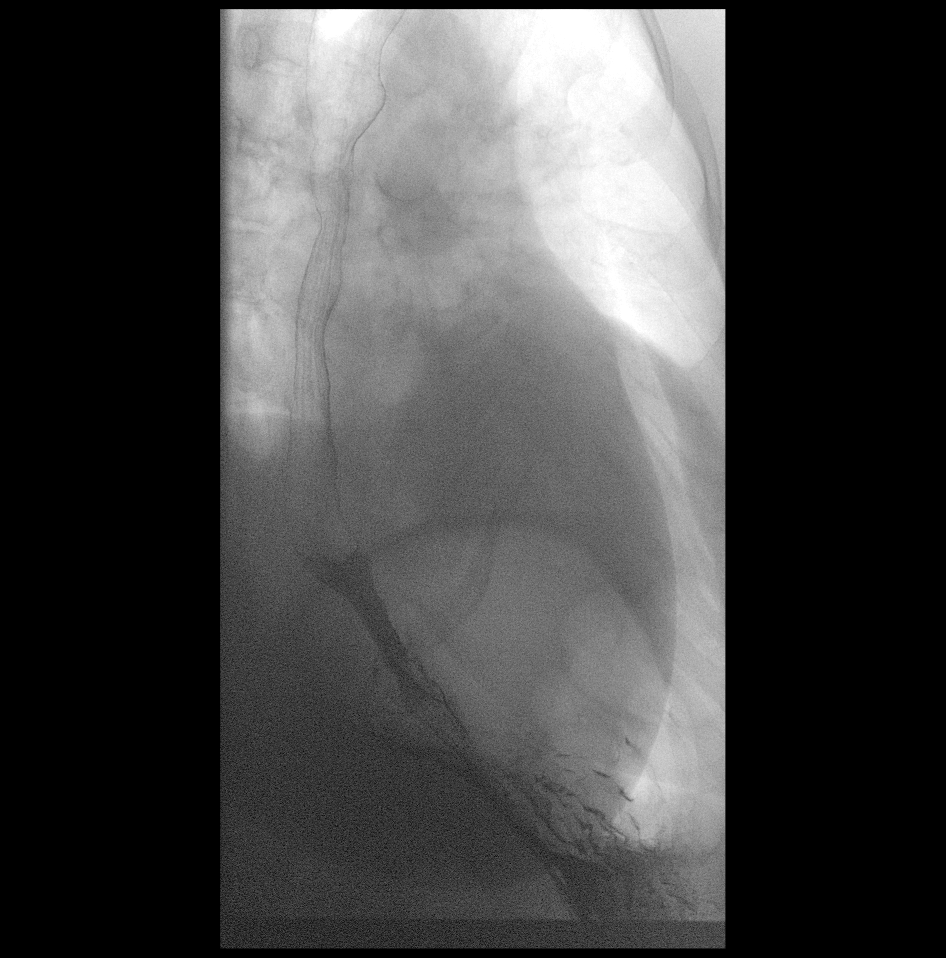

[14 of 19 positions shown; findings below may reference images not displayed]

FINDINGS: There was normal pharyngeal anatomy and motility. Contrast flowed
freely through the esophagus without evidence of stricture or mass.
There was normal esophageal mucosa without evidence of irregularity
or ulceration. Esophageal motility was normal. No evidence of
reflux. No definite hiatal hernia was demonstrated.

At the end of the examination a 13 mm barium tablet was administered
which transited through the esophagus and esophagogastric junction
without delay.
IMPRESSION: Normal barium swallow.

## 2017-08-31 ENCOUNTER — Ambulatory Visit
Admission: RE | Admit: 2017-08-31 | Discharge: 2017-08-31 | Disposition: A | Discharge status: Home / Self Care | Payer: Medicare Other | Source: Ambulatory Visit | Attending: Internal Medicine | Admitting: Internal Medicine

## 2017-08-31 DIAGNOSIS — Z1231 Encounter for screening mammogram for malignant neoplasm of breast: Secondary | ICD-10-CM

## 2017-10-01 DIAGNOSIS — Z87448 Personal history of other diseases of urinary system: Secondary | ICD-10-CM | POA: Diagnosis not present

## 2017-10-01 DIAGNOSIS — L049 Acute lymphadenitis, unspecified: Secondary | ICD-10-CM | POA: Diagnosis not present

## 2017-10-01 DIAGNOSIS — B370 Candidal stomatitis: Secondary | ICD-10-CM | POA: Diagnosis not present

## 2017-10-01 DIAGNOSIS — B3781 Candidal esophagitis: Secondary | ICD-10-CM | POA: Diagnosis not present

## 2017-11-09 ENCOUNTER — Other Ambulatory Visit: Payer: Self-pay

## 2017-11-09 NOTE — Patient Outreach (Signed)
[facility]) Care Management  11/09/2017  Victoria Dalton 1931-05-02 [id number]   Medication Adherence call to Victoria Dalton patient did not answer patient is due on Losartan 100 mg and Glimepiride 2 mg [place] said patient already pick up on 11/08/17 a 30 days supply on both medications. Victoria Dalton is showing past due under [facility] Ins.on Losartan 100 mg and Glimepiride 2 mg.  [place] Management Direct Dial [phone]  Fax [phone] [email or web address]

## 2017-11-11 DIAGNOSIS — R12 Heartburn: Secondary | ICD-10-CM | POA: Diagnosis not present

## 2017-11-11 DIAGNOSIS — I10 Essential (primary) hypertension: Secondary | ICD-10-CM | POA: Diagnosis not present

## 2017-11-11 DIAGNOSIS — T360X5S Adverse effect of penicillins, sequela: Secondary | ICD-10-CM | POA: Diagnosis not present

## 2017-11-11 DIAGNOSIS — E785 Hyperlipidemia, unspecified: Secondary | ICD-10-CM | POA: Diagnosis not present

## 2017-12-08 ENCOUNTER — Other Ambulatory Visit: Payer: Self-pay

## 2017-12-08 NOTE — Patient Outreach (Signed)
[facility]) Care Management  12/08/2017  SHIRLEE WHITMIRE [birth year] [id number]   Medication Adherence call to Mrs. Evern Core spoke with patient she said she already order Losartan 100 mg and Glimepiride 2 mg from [facility] and the pharmacy was waiting for doctor to call in refills, [facility] said they have both medications ready for the patient. Mrs. Lonon will pick up on Friday patient is showing past due under [place].   [place] Management Direct Dial [phone]  Fax [phone] [email or web address]

## 2017-12-24 DIAGNOSIS — N3000 Acute cystitis without hematuria: Secondary | ICD-10-CM | POA: Diagnosis not present

## 2017-12-25 ENCOUNTER — Emergency Department
Admission: EM | Admit: 2017-12-25 | Discharge: 2017-12-25 | Disposition: A | Discharge status: Home / Self Care | Payer: Medicare Other | Attending: Emergency Medicine | Admitting: Emergency Medicine

## 2017-12-25 ENCOUNTER — Emergency Department: Payer: Medicare Other

## 2017-12-25 ENCOUNTER — Other Ambulatory Visit: Payer: Self-pay

## 2017-12-25 DIAGNOSIS — I10 Essential (primary) hypertension: Secondary | ICD-10-CM | POA: Diagnosis not present

## 2017-12-25 DIAGNOSIS — R319 Hematuria, unspecified: Secondary | ICD-10-CM | POA: Diagnosis not present

## 2017-12-25 DIAGNOSIS — Z79899 Other long term (current) drug therapy: Secondary | ICD-10-CM | POA: Insufficient documentation

## 2017-12-25 DIAGNOSIS — E119 Type 2 diabetes mellitus without complications: Secondary | ICD-10-CM | POA: Diagnosis not present

## 2017-12-25 DIAGNOSIS — K5790 Diverticulosis of intestine, part unspecified, without perforation or abscess without bleeding: Secondary | ICD-10-CM | POA: Diagnosis not present

## 2017-12-25 DIAGNOSIS — N3000 Acute cystitis without hematuria: Secondary | ICD-10-CM

## 2017-12-25 DIAGNOSIS — R300 Dysuria: Secondary | ICD-10-CM | POA: Diagnosis present

## 2017-12-25 DIAGNOSIS — Z7982 Long term (current) use of aspirin: Secondary | ICD-10-CM | POA: Diagnosis not present

## 2017-12-25 LAB — URINALYSIS, COMPLETE (UACMP) WITH MICROSCOPIC
Bacteria, UA: NONE SEEN
Bilirubin Urine: NEGATIVE
Glucose, UA: NEGATIVE mg/dL
Ketones, ur: NEGATIVE mg/dL
Nitrite: POSITIVE — AB
Protein, ur: 30 mg/dL — AB
RBC / HPF: >50 RBC/hpf — ABNORMAL HIGH (ref 0–5)
Specific Gravity, Urine: 1.012 (ref 1.005–1.030)
WBC, UA: >50 WBC/hpf — ABNORMAL HIGH (ref 0–5)
pH: 6 (ref 5.0–8.0)

## 2017-12-25 MED ORDER — PHENAZOPYRIDINE HCL 200 MG PO TABS
200.0000 mg | ORAL_TABLET | Freq: Once | ORAL | Status: AC
  Administered 2017-12-25: 200 mg via ORAL
  Filled 2017-12-25: qty 1

## 2017-12-25 MED ORDER — CEPHALEXIN 500 MG PO CAPS: 500.0000 mg | ORAL_CAPSULE | Freq: Two times a day (BID) | ORAL | 0 refills | Status: AC

## 2017-12-25 MED ORDER — KETOROLAC TROMETHAMINE 30 MG/ML IJ SOLN: 15.0000 mg | Freq: Once | INTRAMUSCULAR | Status: DC

## 2017-12-25 MED ORDER — CEPHALEXIN 500 MG PO CAPS
500.0000 mg | ORAL_CAPSULE | Freq: Once | ORAL | Status: AC
  Administered 2017-12-25: 500 mg via ORAL
  Filled 2017-12-25: qty 1

## 2017-12-25 MED ORDER — PHENAZOPYRIDINE HCL 100 MG PO TABS: 100.0000 mg | ORAL_TABLET | Freq: Three times a day (TID) | ORAL | 0 refills | Status: DC | PRN

## 2017-12-25 MED ORDER — PHENAZOPYRIDINE HCL 100 MG PO TABS: 100.0000 mg | ORAL_TABLET | Freq: Three times a day (TID) | ORAL | 0 refills | Status: AC | PRN

## 2017-12-25 NOTE — ED Notes (Signed)

## 2017-12-25 NOTE — ED Provider Notes (Signed)
[facility] Emergency Department Provider Note    First MD Initiated Contact with Patient 12/25/17 [phone]     (approximate)  I have reviewed the triage vital signs and the nursing notes.   HISTORY  Chief Complaint Hematuria    HPI AISA SCHOEPPNER is a 84 y.o. female with below list of conditions including kidney stone presents to the emergency department with dysuria, urinary urgency pelvic discomfort and hematuria x2 hours.  Patient denies any back pain no fever afebrile on presentation.  Patient denies any nausea vomiting diarrhea constipation.   Past Medical History:  Diagnosis Date  . Diabetes mellitus without complication ([place])   . Dyspnea   . Gallbladder calculus   . GERD (gastroesophageal reflux disease)   . Hyperlipidemia   . Hypertension   . Kidney stone   . Mass   . Parotid swelling   . UTI (lower urinary tract infection)     Patient Active Problem List   Diagnosis Date Noted  . Blood in stool   . Gallbladder mass   . Cholangitis   . Cholecystitis   . Right upper quadrant pain   . Hyperbilirubinemia   . Protein-calorie malnutrition, severe 01/11/2016  . Cholecystitis with cholelithiasis 01/10/2016  . Benign essential HTN 12/06/2015  . HLD (hyperlipidemia) 05/23/2015  . Gastro-esophageal reflux disease without esophagitis 05/08/2014  . Controlled type 2 diabetes mellitus without complication ([place]) 16/38/4665  . Type 2 diabetes mellitus without complication ([place]) 99/35/7017    Past Surgical History:  Procedure Laterality Date  . CHOLECYSTECTOMY N/A 04/30/2016   Procedure: CHOLECYSTECTOMY;  Surgeon: Jules Husbands, MD;  Location: [facility] ORS;  Service: General;  Laterality: N/A;  . DIAGNOSTIC LAPAROSCOPIC LIVER BIOPSY  02/28/2016   Procedure: DIAGNOSTIC LAPAROSCOPIC LIVER BIOPSY;  Surgeon: Jules Husbands, MD;  Location: [facility] ORS;  Service: General;;  . ERCP N/A 01/13/2016   Procedure: ENDOSCOPIC RETROGRADE CHOLANGIOPANCREATOGRAPHY  (ERCP);  Surgeon: Lucilla Lame, MD;  Location: [facility] ENDOSCOPY;  Service: Endoscopy;  Laterality: N/A;  . ERCP N/A 04/08/2016   Procedure: ENDOSCOPIC RETROGRADE CHOLANGIOPANCREATOGRAPHY (ERCP) Stent removal;  Surgeon: Lucilla Lame, MD;  Location: [facility] ENDOSCOPY;  Service: Endoscopy;  Laterality: N/A;  . INTRAOPERATIVE CHOLANGIOGRAM  04/30/2016   Procedure: INTRAOPERATIVE CHOLANGIOGRAM;  Surgeon: Jules Husbands, MD;  Location: [facility] ORS;  Service: General;;  . KNEE ARTHROSCOPY Left   . LAPAROSCOPY  02/28/2016   Procedure: Diagnotic laparoscopy with omental biopsy;  Surgeon: Jules Husbands, MD;  Location: [facility] ORS;  Service: General;;  . TONSILLECTOMY      Prior to Admission medications   Medication Sig Start Date End Date Taking? Authorizing Provider  acetaminophen (TYLENOL) 500 MG tablet Take 500 mg by mouth every 6 (six) hours as needed for mild pain.    [provider]  aspirin EC 81 MG tablet Take 81 mg by mouth daily.    [provider]  calcium carbonate (TUMS - DOSED IN MG ELEMENTAL CALCIUM) 500 MG chewable tablet Chew 2 tablets by mouth daily as needed for indigestion or heartburn.    [provider]  glimepiride (AMARYL) 2 MG tablet Take 1 mg by mouth every morning.  12/22/14   [provider]  losartan (COZAAR) 100 MG tablet Take 100 mg by mouth daily.    [provider]  metoprolol succinate (TOPROL-XL) 100 MG 24 hr tablet Take 100 mg by mouth daily.  11/06/15   [provider]  Multiple Vitamins-Minerals (PRESERVISION AREDS PO) Take 1 capsule by mouth  2 (two) times daily.     [provider]  ondansetron (ZOFRAN) 4 MG tablet Take 1 tablet (4 mg total) by mouth every 8 (eight) hours as needed for nausea or vomiting. 06/05/16   Rudene Re, MD  ONE [facility] ULTRA TEST test strip  11/02/15   [provider]  pantoprazole (PROTONIX) 40 MG tablet take 1 tablet by mouth once daily 07/09/16   Lucilla Lame, MD  phenazopyridine  (PYRIDIUM) 100 MG tablet Take 1 tablet (100 mg total) by mouth 3 (three) times daily as needed for pain. 03/16/17 03/16/18  Loney Hering, MD  simvastatin (ZOCOR) 40 MG tablet Take 40 mg by mouth at bedtime. 10/12/15   [provider]  sitaGLIPtin (JANUVIA) 50 MG tablet Take 25 mg by mouth daily.     [provider]    Allergies Chlorhexidine; Poison oak extract [poison oak extract]; Ramipril; Penicillins; and Sulfa antibiotics  Family History  Problem Relation Age of Onset  . Cancer Father   . Heart attack Father   . Cancer Brother   . Breast cancer Neg Hx     Social History Social History   Tobacco Use  . Smoking status: Never Smoker  . Smokeless tobacco: Never Used  Substance Use Topics  . Alcohol use: No  . Drug use: No    Review of Systems Constitutional: No fever/chills Eyes: No visual changes. ENT: No sore throat. Cardiovascular: Denies chest pain. Respiratory: Denies shortness of breath. Gastrointestinal: No abdominal pain.  No nausea, no vomiting.  No diarrhea.  No constipation. Genitourinary: Positive for dysuria, hematuria Musculoskeletal: Negative for neck pain.  Negative for back pain. Integumentary: Negative for rash. Neurological: Negative for headaches, focal weakness or numbness.   ____________________________________________   PHYSICAL EXAM:  VITAL SIGNS: ED Triage Vitals  Enc Vitals Group     BP 12/25/17 0015 (!) 182/89     Pulse Rate 12/25/17 0015 60     Resp 12/25/17 0015 16     Temp 12/25/17 0015 98.3 F (36.8 C)     Temp Source 12/25/17 0015 Oral     SpO2 12/25/17 0015 97 %     Weight 12/25/17 0010 112.5 kg (248 lb)     Height 12/25/17 0010 1.753 m (5\' 9" )     Head Circumference --      Peak Flow --      Pain Score 12/25/17 0010 8     Pain Loc --      Pain Edu? --      Excl. in [place]? --     Constitutional: Alert and oriented. Well appearing and in no acute distress. Eyes: Conjunctivae are normal.  Head:  Atraumatic. Mouth/Throat: Mucous membranes are moist.  Oropharynx non-erythematous. Neck: No stridor.  Cardiovascular: Normal rate, regular rhythm. Good peripheral circulation. Grossly normal heart sounds. Respiratory: Normal respiratory effort.  No retractions. Lungs CTAB. Gastrointestinal: Soft and nontender. No distention.  Musculoskeletal: No lower extremity tenderness nor edema. No gross deformities of extremities. Neurologic:  Normal speech and language. No gross focal neurologic deficits are appreciated.  Skin:  Skin is warm, dry and intact. No rash noted. Psychiatric: Mood and affect are normal. Speech and behavior are normal.  ____________________________________________   LABS (all labs ordered are listed, but only abnormal results are displayed)  Labs Reviewed  URINALYSIS, COMPLETE (UACMP) WITH MICROSCOPIC - Abnormal; Notable for the following components:      Result Value   Color, Urine YELLOW (*)    APPearance CLOUDY (*)  Hgb urine dipstick LARGE (*)    Protein, ur 30 (*)    Nitrite POSITIVE (*)    Leukocytes, UA MODERATE (*)    RBC / HPF >50 (*)    WBC, UA >50 (*)    All other components within normal limits     RADIOLOGY I, Jewett N Ercilia Bettinger, personally viewed and evaluated these images (plain radiographs) as part of my medical decision making, as well as reviewing the written report by the radiologist.  ED MD interpretation: No acute findings noted on CT renal protocol.  Official radiology report(s): Ct Renal Stone Study  Result Date: 12/25/2017 CLINICAL DATA:  Pelvic pain, hematuria for 2 hours. History of kidney stones with similar symptoms. Status post cholecystectomy. EXAM: CT ABDOMEN AND PELVIS WITHOUT CONTRAST TECHNIQUE: Multidetector CT imaging of the abdomen and pelvis was performed following the standard protocol without IV contrast. COMPARISON:  CT abdomen and pelvis March 16, 2017 FINDINGS: LOWER CHEST: Lingular atelectasis/scarring is unchanged.  The visualized heart size is normal. No pericardial effusion. HEPATOBILIARY: Status post cholecystectomy. Curvilinear hypodensity adjacent to gallbladder fossa with punctate calcifications similar to prior CT, otherwise unremarkable noncontrast CT liver. PANCREAS: Normal. SPLEEN: Normal. ADRENALS/URINARY TRACT: Kidneys are orthotopic, demonstrating normal size and morphology. No nephrolithiasis, hydronephrosis; limited assessment for renal masses by nonenhanced CT. The unopacified ureters are normal in course and caliber. Urinary bladder is partially distended and unremarkable. Slight we thickened LEFT adrenal gland without discrete nodule. STOMACH/BOWEL: Small hiatal hernia. The stomach, small and large bowel are normal in course and caliber without inflammatory changes, sensitivity decreased by lack of enteric contrast. Severe descending and sigmoid colonic diverticulosis. Small amount of small bowel feces compatible with chronic stasis. Normal appendix. VASCULAR/LYMPHATIC: Aortoiliac vessels are normal in course and caliber. Mild calcific atherosclerosis. No lymphadenopathy by CT size criteria. REPRODUCTIVE: Stable 17 mm RIGHT adnexal cyst. OTHER: No intraperitoneal free fluid or free air. MUSCULOSKELETAL: Non-acute. Severe degenerative change of the RIGHT hip. Osteopenia. Grade 1 L4-5 anterolisthesis and grade 1 L5-S1 anterolisthesis. No spondylolysis. Severe RIGHT L5-S1, severe LEFT L2-3 and L3-4 neural foraminal narrowing. IMPRESSION: 1. No nephrolithiasis, hydronephrosis or acute intra-abdominal/pelvic process. 2. Diverticulosis without acute diverticulitis. Aortic Atherosclerosis (ICD10-I70.0). Electronically Signed   By: Elon Alas M.D.   On: 12/25/2017 03:22     Procedures   ____________________________________________   INITIAL IMPRESSION / ASSESSMENT AND PLAN / ED COURSE  As part of my medical decision making, I reviewed the following data within the electronic MEDICAL RECORD NUMBER    84 year old female presenting with above-stated history and physical exam concerning for UTI with possibly a concomitant kidney stone.  Urinalysis consistent with UTI CT scan revealed no evidence of kidney stone.  Given Pyridium and Keflex in the emergency department will be prescribed same for home     ____________________________________________  FINAL CLINICAL IMPRESSION(S) / ED DIAGNOSES  Final diagnoses:  Acute cystitis without hematuria     MEDICATIONS GIVEN DURING THIS VISIT:  Medications  ketorolac (TORADOL) 30 MG/ML injection 15 mg (has no administration in time range)  phenazopyridine (PYRIDIUM) tablet 200 mg (200 mg Oral Given 12/25/17 0316)     ED Discharge Orders    None       Note:  This document was prepared using Dragon voice recognition software and may include unintentional dictation errors.    Gregor Hams, MD 12/25/17 [phone]

## 2017-12-25 NOTE — ED Triage Notes (Addendum)
Pt arrives to ED via POV from home with c/o pelvic pain and hematuria x2 hrs. Pt reports h/x of kidney stones and reports pain and s/x's are similar. No reported N/V/D or fever. Pt reports taking 2 Tylenol PTA without relief.

## 2017-12-27 LAB — URINE CULTURE: Culture: >=100000 — AB

## 2018-02-11 DIAGNOSIS — E785 Hyperlipidemia, unspecified: Secondary | ICD-10-CM | POA: Diagnosis not present

## 2018-02-11 DIAGNOSIS — E119 Type 2 diabetes mellitus without complications: Secondary | ICD-10-CM | POA: Diagnosis not present

## 2018-02-11 DIAGNOSIS — L049 Acute lymphadenitis, unspecified: Secondary | ICD-10-CM | POA: Diagnosis not present

## 2018-02-11 DIAGNOSIS — I10 Essential (primary) hypertension: Secondary | ICD-10-CM | POA: Diagnosis not present

## 2018-03-02 DIAGNOSIS — E119 Type 2 diabetes mellitus without complications: Secondary | ICD-10-CM | POA: Diagnosis not present

## 2018-03-02 DIAGNOSIS — M2041 Other hammer toe(s) (acquired), right foot: Secondary | ICD-10-CM | POA: Diagnosis not present

## 2018-03-02 DIAGNOSIS — M79674 Pain in right toe(s): Secondary | ICD-10-CM | POA: Diagnosis not present

## 2018-03-02 DIAGNOSIS — B351 Tinea unguium: Secondary | ICD-10-CM | POA: Diagnosis not present

## 2018-03-02 DIAGNOSIS — M79675 Pain in left toe(s): Secondary | ICD-10-CM | POA: Diagnosis not present

## 2018-04-07 DIAGNOSIS — L049 Acute lymphadenitis, unspecified: Secondary | ICD-10-CM | POA: Diagnosis not present

## 2018-04-07 DIAGNOSIS — E119 Type 2 diabetes mellitus without complications: Secondary | ICD-10-CM | POA: Diagnosis not present

## 2018-04-07 DIAGNOSIS — Z0000 Encounter for general adult medical examination without abnormal findings: Secondary | ICD-10-CM | POA: Diagnosis not present

## 2018-04-07 DIAGNOSIS — E785 Hyperlipidemia, unspecified: Secondary | ICD-10-CM | POA: Diagnosis not present

## 2018-04-07 DIAGNOSIS — I10 Essential (primary) hypertension: Secondary | ICD-10-CM | POA: Diagnosis not present

## 2018-06-07 DIAGNOSIS — E785 Hyperlipidemia, unspecified: Secondary | ICD-10-CM | POA: Diagnosis not present

## 2018-06-07 DIAGNOSIS — I10 Essential (primary) hypertension: Secondary | ICD-10-CM | POA: Diagnosis not present

## 2018-06-07 DIAGNOSIS — Z87448 Personal history of other diseases of urinary system: Secondary | ICD-10-CM | POA: Diagnosis not present

## 2018-07-15 DIAGNOSIS — E119 Type 2 diabetes mellitus without complications: Secondary | ICD-10-CM | POA: Diagnosis not present

## 2018-07-15 DIAGNOSIS — B351 Tinea unguium: Secondary | ICD-10-CM | POA: Diagnosis not present

## 2018-07-15 DIAGNOSIS — L03031 Cellulitis of right toe: Secondary | ICD-10-CM | POA: Diagnosis not present

## 2018-07-15 DIAGNOSIS — M79675 Pain in left toe(s): Secondary | ICD-10-CM | POA: Diagnosis not present

## 2018-07-15 DIAGNOSIS — M79674 Pain in right toe(s): Secondary | ICD-10-CM | POA: Diagnosis not present

## 2018-07-29 DIAGNOSIS — L03031 Cellulitis of right toe: Secondary | ICD-10-CM | POA: Diagnosis not present

## 2018-11-19 DIAGNOSIS — E119 Type 2 diabetes mellitus without complications: Secondary | ICD-10-CM | POA: Diagnosis not present

## 2018-11-19 DIAGNOSIS — E785 Hyperlipidemia, unspecified: Secondary | ICD-10-CM | POA: Diagnosis not present

## 2018-11-19 DIAGNOSIS — L049 Acute lymphadenitis, unspecified: Secondary | ICD-10-CM | POA: Diagnosis not present

## 2018-12-10 DIAGNOSIS — S76912A Strain of unspecified muscles, fascia and tendons at thigh level, left thigh, initial encounter: Secondary | ICD-10-CM | POA: Diagnosis not present

## 2018-12-20 DIAGNOSIS — B370 Candidal stomatitis: Secondary | ICD-10-CM | POA: Diagnosis not present

## 2018-12-20 DIAGNOSIS — E119 Type 2 diabetes mellitus without complications: Secondary | ICD-10-CM | POA: Diagnosis not present

## 2018-12-20 DIAGNOSIS — B3781 Candidal esophagitis: Secondary | ICD-10-CM | POA: Diagnosis not present

## 2018-12-20 DIAGNOSIS — Z882 Allergy status to sulfonamides status: Secondary | ICD-10-CM | POA: Diagnosis not present

## 2019-02-22 DIAGNOSIS — H353132 Nonexudative age-related macular degeneration, bilateral, intermediate dry stage: Secondary | ICD-10-CM | POA: Diagnosis not present

## 2019-02-22 DIAGNOSIS — H5213 Myopia, bilateral: Secondary | ICD-10-CM | POA: Diagnosis not present

## 2019-02-22 DIAGNOSIS — H2513 Age-related nuclear cataract, bilateral: Secondary | ICD-10-CM | POA: Diagnosis not present

## 2019-02-22 DIAGNOSIS — H25043 Posterior subcapsular polar age-related cataract, bilateral: Secondary | ICD-10-CM | POA: Diagnosis not present

## 2019-02-22 DIAGNOSIS — E119 Type 2 diabetes mellitus without complications: Secondary | ICD-10-CM | POA: Diagnosis not present

## 2019-02-22 DIAGNOSIS — H18423 Band keratopathy, bilateral: Secondary | ICD-10-CM | POA: Diagnosis not present

## 2019-03-21 DIAGNOSIS — I10 Essential (primary) hypertension: Secondary | ICD-10-CM | POA: Diagnosis not present

## 2019-03-21 DIAGNOSIS — E119 Type 2 diabetes mellitus without complications: Secondary | ICD-10-CM | POA: Diagnosis not present

## 2019-03-21 DIAGNOSIS — K591 Functional diarrhea: Secondary | ICD-10-CM | POA: Diagnosis not present

## 2019-03-21 DIAGNOSIS — K219 Gastro-esophageal reflux disease without esophagitis: Secondary | ICD-10-CM | POA: Diagnosis not present

## 2019-05-28 DIAGNOSIS — N390 Urinary tract infection, site not specified: Secondary | ICD-10-CM | POA: Diagnosis not present

## 2019-05-28 DIAGNOSIS — R300 Dysuria: Secondary | ICD-10-CM | POA: Diagnosis not present

## 2019-06-21 DIAGNOSIS — R12 Heartburn: Secondary | ICD-10-CM | POA: Diagnosis not present

## 2019-06-21 DIAGNOSIS — E119 Type 2 diabetes mellitus without complications: Secondary | ICD-10-CM | POA: Diagnosis not present

## 2019-06-21 DIAGNOSIS — E785 Hyperlipidemia, unspecified: Secondary | ICD-10-CM | POA: Diagnosis not present

## 2019-06-21 DIAGNOSIS — I10 Essential (primary) hypertension: Secondary | ICD-10-CM | POA: Diagnosis not present

## 2019-08-20 ENCOUNTER — Other Ambulatory Visit: Payer: Self-pay

## 2019-08-20 DIAGNOSIS — N3001 Acute cystitis with hematuria: Secondary | ICD-10-CM | POA: Insufficient documentation

## 2019-08-20 DIAGNOSIS — Z7984 Long term (current) use of oral hypoglycemic drugs: Secondary | ICD-10-CM | POA: Insufficient documentation

## 2019-08-20 DIAGNOSIS — Z79899 Other long term (current) drug therapy: Secondary | ICD-10-CM | POA: Diagnosis not present

## 2019-08-20 DIAGNOSIS — R319 Hematuria, unspecified: Secondary | ICD-10-CM | POA: Diagnosis not present

## 2019-08-20 DIAGNOSIS — Z87442 Personal history of urinary calculi: Secondary | ICD-10-CM | POA: Insufficient documentation

## 2019-08-20 DIAGNOSIS — K5730 Diverticulosis of large intestine without perforation or abscess without bleeding: Secondary | ICD-10-CM | POA: Diagnosis not present

## 2019-08-20 DIAGNOSIS — E119 Type 2 diabetes mellitus without complications: Secondary | ICD-10-CM | POA: Diagnosis not present

## 2019-08-20 DIAGNOSIS — I10 Essential (primary) hypertension: Secondary | ICD-10-CM | POA: Insufficient documentation

## 2019-08-20 LAB — CBC
HCT: 40.2 % (ref 36.0–46.0)
Hemoglobin: 13.3 g/dL (ref 12.0–15.0)
MCH: 30.1 pg (ref 26.0–34.0)
MCHC: 33.1 g/dL (ref 30.0–36.0)
MCV: 91 fL (ref 80.0–100.0)
Platelets: 186 10*3/uL (ref 150–400)
RBC: 4.42 MIL/uL (ref 3.87–5.11)
RDW: 12.5 % (ref 11.5–15.5)
WBC: 10.5 10*3/uL (ref 4.0–10.5)
nRBC: 0 % (ref 0.0–0.2)

## 2019-08-20 NOTE — ED Triage Notes (Signed)
Patient reports symptoms began this morning.  Reports hematuria and dysuria.

## 2019-08-21 ENCOUNTER — Emergency Department: Payer: Medicare Other

## 2019-08-21 ENCOUNTER — Emergency Department
Admission: EM | Admit: 2019-08-21 | Discharge: 2019-08-21 | Disposition: A | Discharge status: Home / Self Care | Payer: Medicare Other | Attending: Emergency Medicine | Admitting: Emergency Medicine

## 2019-08-21 DIAGNOSIS — N3001 Acute cystitis with hematuria: Secondary | ICD-10-CM

## 2019-08-21 DIAGNOSIS — K5730 Diverticulosis of large intestine without perforation or abscess without bleeding: Secondary | ICD-10-CM | POA: Diagnosis not present

## 2019-08-21 DIAGNOSIS — R319 Hematuria, unspecified: Secondary | ICD-10-CM | POA: Diagnosis not present

## 2019-08-21 LAB — URINALYSIS, COMPLETE (UACMP) WITH MICROSCOPIC
Bilirubin Urine: NEGATIVE
Glucose, UA: 50 mg/dL — AB
Ketones, ur: NEGATIVE mg/dL
Nitrite: NEGATIVE
Protein, ur: 100 mg/dL — AB
RBC / HPF: >50 RBC/hpf — ABNORMAL HIGH (ref 0–5)
Specific Gravity, Urine: 1.008 (ref 1.005–1.030)
WBC, UA: >50 WBC/hpf — ABNORMAL HIGH (ref 0–5)
pH: 6 (ref 5.0–8.0)

## 2019-08-21 LAB — BASIC METABOLIC PANEL
Anion gap: 8 (ref 5–15)
BUN: 12 mg/dL (ref 8–23)
CO2: 25 mmol/L (ref 22–32)
Calcium: 8.8 mg/dL — ABNORMAL LOW (ref 8.9–10.3)
Chloride: 102 mmol/L (ref 98–111)
Creatinine, Ser: 0.9 mg/dL (ref 0.44–1.00)
GFR calc Af Amer: >60 mL/min (ref >60)
GFR calc non Af Amer: 58 mL/min — ABNORMAL LOW (ref >60)
Glucose, Bld: 160 mg/dL — ABNORMAL HIGH (ref 70–99)
Potassium: 4.2 mmol/L (ref 3.5–5.1)
Sodium: 135 mmol/L (ref 135–145)

## 2019-08-21 MED ORDER — ACETAMINOPHEN 325 MG PO TABS
650.0000 mg | ORAL_TABLET | Freq: Once | ORAL | Status: AC
  Administered 2019-08-21: 650 mg via ORAL

## 2019-08-21 MED ORDER — SODIUM CHLORIDE 0.9 % IV SOLN
1.0000 g | Freq: Once | INTRAVENOUS | Status: AC
  Administered 2019-08-21: 1 g via INTRAVENOUS
  Filled 2019-08-21: qty 10

## 2019-08-21 MED ORDER — CEPHALEXIN 500 MG PO CAPS: 500.0000 mg | ORAL_CAPSULE | Freq: Three times a day (TID) | ORAL | 0 refills | Status: AC

## 2019-08-21 MED ORDER — ACETAMINOPHEN 325 MG PO TABS
ORAL_TABLET | ORAL | Status: AC
  Filled 2019-08-21: qty 2

## 2019-08-21 NOTE — Discharge Instructions (Signed)

## 2019-08-21 NOTE — ED Provider Notes (Signed)
[facility] Emergency Department Provider Note  ____________________________________________  Time seen: Approximately 6:29 AM  I have reviewed the triage vital signs and the nursing notes.   HISTORY  Chief Complaint Hematuria   HPI Victoria Dalton is a 86 y.o. female with a history of diabetes, hypertension, hyperlipidemia, kidney stones, frequent UTIs who presents for evaluation of a UTI symptom.  Patient reports her symptoms started last night with dysuria.  She took 2 Azo's and cranberry juice.  This morning when she woke up she noticed blood in her urine which prompted her visit to the emergency room.  She is complaining of burning with urination but no abdominal pain, no flank pain, no nausea or vomiting, no fever or chills.   Past Medical History:  Diagnosis Date  . Diabetes mellitus without complication ([place])   . Dyspnea   . Gallbladder calculus   . GERD (gastroesophageal reflux disease)   . Hyperlipidemia   . Hypertension   . Kidney stone   . Mass   . Parotid swelling   . UTI (lower urinary tract infection)     Patient Active Problem List   Diagnosis Date Noted  . Blood in stool   . Gallbladder mass   . Cholangitis   . Cholecystitis   . Right upper quadrant pain   . Hyperbilirubinemia   . Protein-calorie malnutrition, severe 01/11/2016  . Cholecystitis with cholelithiasis 01/10/2016  . Benign essential HTN 12/06/2015  . HLD (hyperlipidemia) 05/23/2015  . Gastro-esophageal reflux disease without esophagitis 05/08/2014  . Controlled type 2 diabetes mellitus without complication ([place]) [id number]  . Type 2 diabetes mellitus without complication ([place]) [id number]    Past Surgical History:  Procedure Laterality Date  . CHOLECYSTECTOMY N/A 04/30/2016   Procedure: CHOLECYSTECTOMY;  Surgeon: Jules Husbands, MD;  Location: [facility] ORS;  Service: General;  Laterality: N/A;  . DIAGNOSTIC LAPAROSCOPIC LIVER BIOPSY  02/28/2016   Procedure:  DIAGNOSTIC LAPAROSCOPIC LIVER BIOPSY;  Surgeon: Jules Husbands, MD;  Location: [facility] ORS;  Service: General;;  . ERCP N/A 01/13/2016   Procedure: ENDOSCOPIC RETROGRADE CHOLANGIOPANCREATOGRAPHY (ERCP);  Surgeon: Lucilla Lame, MD;  Location: [facility] ENDOSCOPY;  Service: Endoscopy;  Laterality: N/A;  . ERCP N/A 04/08/2016   Procedure: ENDOSCOPIC RETROGRADE CHOLANGIOPANCREATOGRAPHY (ERCP) Stent removal;  Surgeon: Lucilla Lame, MD;  Location: [facility] ENDOSCOPY;  Service: Endoscopy;  Laterality: N/A;  . INTRAOPERATIVE CHOLANGIOGRAM  04/30/2016   Procedure: INTRAOPERATIVE CHOLANGIOGRAM;  Surgeon: Jules Husbands, MD;  Location: [facility] ORS;  Service: General;;  . KNEE ARTHROSCOPY Left   . LAPAROSCOPY  02/28/2016   Procedure: Diagnotic laparoscopy with omental biopsy;  Surgeon: Jules Husbands, MD;  Location: [facility] ORS;  Service: General;;  . TONSILLECTOMY      Prior to Admission medications   Medication Sig Start Date End Date Taking? Authorizing Provider  acetaminophen (TYLENOL) 500 MG tablet Take 500 mg by mouth every 6 (six) hours as needed for mild pain.    [provider]  aspirin EC 81 MG tablet Take 81 mg by mouth daily.    [provider]  calcium carbonate (TUMS - DOSED IN MG ELEMENTAL CALCIUM) 500 MG chewable tablet Chew 2 tablets by mouth daily as needed for indigestion or heartburn.    [provider]  cephALEXin (KEFLEX) 500 MG capsule Take 1 capsule (500 mg total) by mouth 3 (three) times daily for 7 days. 08/21/19 08/28/19  Rudene Re, MD  glimepiride (AMARYL) 2 MG tablet Take 1 mg by mouth every morning.  12/22/14   [provider]  losartan (COZAAR) 100 MG tablet Take 100 mg by mouth daily.    [provider]  metoprolol succinate (TOPROL-XL) 100 MG 24 hr tablet Take 100 mg by mouth daily.  11/06/15   [provider]  Multiple Vitamins-Minerals (PRESERVISION AREDS PO) Take 1 capsule by mouth 2 (two) times daily.     [provider]    ondansetron (ZOFRAN) 4 MG tablet Take 1 tablet (4 mg total) by mouth every 8 (eight) hours as needed for nausea or vomiting. 06/05/16   Rudene Re, MD  ONE [facility] ULTRA TEST test strip  11/02/15   [provider]  pantoprazole (PROTONIX) 40 MG tablet take 1 tablet by mouth once daily 07/09/16   Lucilla Lame, MD  simvastatin (ZOCOR) 40 MG tablet Take 40 mg by mouth at bedtime. 10/12/15   [provider]  sitaGLIPtin (JANUVIA) 50 MG tablet Take 25 mg by mouth daily.     [provider]    Allergies Chlorhexidine, Poison oak extract [poison oak extract], Ramipril, Penicillins, and Sulfa antibiotics  Family History  Problem Relation Age of Onset  . Cancer Father   . Heart attack Father   . Cancer Brother   . Breast cancer Neg Hx     Social History Social History   Tobacco Use  . Smoking status: Never Smoker  . Smokeless tobacco: Never Used  Substance Use Topics  . Alcohol use: No  . Drug use: No    Review of Systems  Constitutional: Negative for fever. Eyes: Negative for visual changes. ENT: Negative for sore throat. Neck: No neck pain  Cardiovascular: Negative for chest pain. Respiratory: Negative for shortness of breath. Gastrointestinal: Negative for abdominal pain, vomiting or diarrhea. Genitourinary: + dysuria and hematuria Musculoskeletal: Negative for back pain. Skin: Negative for rash. Neurological: Negative for headaches, weakness or numbness. Psych: No SI or HI  ____________________________________________   PHYSICAL EXAM:  VITAL SIGNS: ED Triage Vitals  Enc Vitals Group     BP 08/20/19 2339 (!) 157/119     Pulse Rate 08/20/19 2339 70     Resp 08/20/19 2339 18     Temp 08/20/19 2339 98.4 F (36.9 C)     Temp Source 08/20/19 2339 Oral     SpO2 08/20/19 2339 95 %     Weight 08/20/19 2337 250 lb (113.4 kg)     Height 08/20/19 2337 5' 8.5" (1.74 m)     Head Circumference --      Peak Flow --      Pain Score 08/21/19 0426  8     Pain Loc --      Pain Edu? --      Excl. in [place]? --     Constitutional: Alert and oriented. Well appearing and in no apparent distress. HEENT:      Head: Normocephalic and atraumatic.         Eyes: Conjunctivae are normal. Sclera is non-icteric.       Mouth/Throat: Mucous membranes are moist.       Neck: Supple with no signs of meningismus. Cardiovascular: Regular rate and rhythm. No murmurs, gallops, or rubs. Respiratory: Normal respiratory effort. Lungs are clear to auscultation bilaterally.  Gastrointestinal: Soft, non tender, and non distended with positive bowel sounds. No rebound or guarding. Genitourinary: No CVA tenderness. Musculoskeletal: Nontender with normal range of motion in all extremities. No edema, cyanosis, or erythema of extremities. Neurologic: Normal speech and language. Face is symmetric. Moving  all extremities. No gross focal neurologic deficits are appreciated. Skin: Skin is warm, dry and intact. No rash noted. Psychiatric: Mood and affect are normal. Speech and behavior are normal.  ____________________________________________   LABS (all labs ordered are listed, but only abnormal results are displayed)  Labs Reviewed  URINALYSIS, COMPLETE (UACMP) WITH MICROSCOPIC - Abnormal; Notable for the following components:      Result Value   Color, Urine AMBER (*)    APPearance HAZY (*)    Glucose, UA 50 (*)    Hgb urine dipstick LARGE (*)    Protein, ur 100 (*)    Leukocytes,Ua MODERATE (*)    RBC / HPF >50 (*)    WBC, UA >50 (*)    Bacteria, UA RARE (*)    All other components within normal limits  BASIC METABOLIC PANEL - Abnormal; Notable for the following components:   Glucose, Bld 160 (*)    Calcium 8.8 (*)    GFR calc non Af Amer 58 (*)    All other components within normal limits  URINE CULTURE  CBC   ____________________________________________  EKG  none  ____________________________________________  RADIOLOGY  I have personally  reviewed the images performed during this visit and I agree with the Radiologist's read.   Interpretation by Radiologist:  CT Renal Stone Study  Result Date: 08/21/2019 CLINICAL DATA:  Hematuria EXAM: CT ABDOMEN AND PELVIS WITHOUT CONTRAST TECHNIQUE: Multidetector CT imaging of the abdomen and pelvis was performed following the standard protocol without IV contrast. COMPARISON:  December 25, 2017 FINDINGS: Lower chest: The visualized heart size within normal limits. No pericardial fluid/thickening. There is a small hiatal hernia present. The visualized portions of the lungs are clear. Hepatobiliary: The patient is status post cholecystectomy. There is a tiny linear hypodense area with tiny calcifications seen adjacent to the gallbladder bed. Pancreas:  Unremarkable.  No surrounding inflammatory changes. Spleen: Normal in size. Although limited due to the lack of intravenous contrast, normal in appearance. Adrenals/Urinary Tract: Both adrenal glands appear normal. The kidneys and collecting system appear normal without evidence of urinary tract calculus or hydronephrosis. There is mild diffuse apparent wall thickening and question of mild fat stranding changes around the superior bladder wall. Stomach/Bowel: The stomach, small bowel, and colon are normal in appearance. No inflammatory changes or obstructive findings. Scattered colonic diverticula are noted without diverticulitis. Vascular/Lymphatic: There are no enlarged abdominal or pelvic lymph nodes. Scattered aortic atherosclerotic calcifications are seen without aneurysmal dilatation. Reproductive: The uterus and adnexa are unremarkable. Other: No evidence of abdominal wall mass or hernia. Musculoskeletal: No acute or significant osseous findings. IMPRESSION: 1. Mild diffuse bladder wall thickening and question of minimal fat stranding changes which could be due to cystitis. 2. No renal or collecting system calculi.  No hydronephrosis 3. Diverticulosis  without diverticulitis. 4.  Aortic Atherosclerosis (ICD10-I70.0). Electronically Signed   By: Prudencio Pair M.D.   On: 08/21/2019 01:57     ____________________________________________   PROCEDURES  Procedure(s) performed: None Procedures Critical Care performed:  None ____________________________________________   INITIAL IMPRESSION / ASSESSMENT AND PLAN / ED COURSE  86 y.o. female with a history of diabetes, hypertension, hyperlipidemia, kidney stones, frequent UTIs who presents for evaluation of dysuria and hematuria that started last night.  Patient is well-appearing in no distress, normal vitals with no signs of sepsis.  UA positive for UTI.  Review of epic show patient usually gross E. coli but sensitive to Rocephin and Keflex.  Labs showing no leukocytosis. Culture pending.  CT was done to rule out an obstructing stone and that is negative.  Patent given rocephin and will be dc home on keflex.  Discussed follow-up with PCP and my return precautions for abdominal pain, flank pain, fever, nausea vomiting.   I have reviewed patient's previous medical records and PMH.     _____________________________________________ Please note:  Patient was evaluated in Emergency Department today for the symptoms described in the history of present illness. Patient was evaluated in the context of the global COVID-19 pandemic, which necessitated consideration that the patient might be at risk for infection with the SARS-CoV-2 virus that causes COVID-19. Institutional protocols and algorithms that pertain to the evaluation of patients at risk for COVID-19 are in a state of rapid change based on information released by regulatory bodies including the CDC and federal and state organizations. These policies and algorithms were followed during the patient's care in the ED.  Some ED evaluations and interventions may be delayed as a result of limited staffing during the  pandemic.   ____________________________________________   FINAL CLINICAL IMPRESSION(S) / ED DIAGNOSES   Final diagnoses:  Acute cystitis with hematuria      NEW MEDICATIONS STARTED DURING THIS VISIT:  ED Discharge Orders         Ordered    cephALEXin (KEFLEX) 500 MG capsule  3 times daily     08/21/19 [id number]           Note:  This document was prepared using Dragon voice recognition software and may include unintentional dictation errors.    Alfred Levins, [place], MD 08/21/19 [phone]

## 2019-08-21 NOTE — ED Notes (Signed)
Patient updated on wait time. Patient verbalizes understanding.  

## 2019-08-24 LAB — URINE CULTURE: Culture: 30000 — AB

## 2019-08-30 DIAGNOSIS — E119 Type 2 diabetes mellitus without complications: Secondary | ICD-10-CM | POA: Diagnosis not present

## 2019-08-30 DIAGNOSIS — E785 Hyperlipidemia, unspecified: Secondary | ICD-10-CM | POA: Diagnosis not present

## 2019-08-30 DIAGNOSIS — I10 Essential (primary) hypertension: Secondary | ICD-10-CM | POA: Diagnosis not present

## 2019-08-30 DIAGNOSIS — N390 Urinary tract infection, site not specified: Secondary | ICD-10-CM | POA: Diagnosis not present

## 2019-08-30 DIAGNOSIS — K219 Gastro-esophageal reflux disease without esophagitis: Secondary | ICD-10-CM | POA: Diagnosis not present

## 2019-09-19 DIAGNOSIS — R12 Heartburn: Secondary | ICD-10-CM | POA: Diagnosis not present

## 2019-09-19 DIAGNOSIS — E119 Type 2 diabetes mellitus without complications: Secondary | ICD-10-CM | POA: Diagnosis not present

## 2019-09-19 DIAGNOSIS — T360X5S Adverse effect of penicillins, sequela: Secondary | ICD-10-CM | POA: Diagnosis not present

## 2019-09-19 DIAGNOSIS — I10 Essential (primary) hypertension: Secondary | ICD-10-CM | POA: Diagnosis not present

## 2019-10-24 ENCOUNTER — Other Ambulatory Visit: Payer: Self-pay | Admitting: Internal Medicine

## 2019-10-25 ENCOUNTER — Other Ambulatory Visit: Payer: Self-pay | Admitting: *Deleted

## 2019-10-25 ENCOUNTER — Other Ambulatory Visit: Payer: Self-pay | Admitting: Internal Medicine

## 2019-10-25 ENCOUNTER — Encounter: Payer: Self-pay | Admitting: Internal Medicine

## 2019-10-25 ENCOUNTER — Telehealth: Payer: Self-pay | Admitting: Internal Medicine

## 2019-10-25 ENCOUNTER — Ambulatory Visit (INDEPENDENT_AMBULATORY_CARE_PROVIDER_SITE_OTHER): Payer: Medicare Other | Admitting: Internal Medicine

## 2019-10-25 ENCOUNTER — Other Ambulatory Visit: Payer: Self-pay

## 2019-10-25 VITALS — BP 155/71 | HR 71

## 2019-10-25 DIAGNOSIS — N760 Acute vaginitis: Secondary | ICD-10-CM | POA: Insufficient documentation

## 2019-10-25 DIAGNOSIS — E782 Mixed hyperlipidemia: Secondary | ICD-10-CM

## 2019-10-25 DIAGNOSIS — R350 Frequency of micturition: Secondary | ICD-10-CM

## 2019-10-25 DIAGNOSIS — I10 Essential (primary) hypertension: Secondary | ICD-10-CM | POA: Diagnosis not present

## 2019-10-25 LAB — POCT URINALYSIS DIPSTICK
Bilirubin, UA: NEGATIVE
Blood, UA: NEGATIVE
Glucose, UA: NEGATIVE
Nitrite, UA: POSITIVE
Protein, UA: POSITIVE — AB
Spec Grav, UA: >=1.03 — AB (ref 1.010–1.025)
Urobilinogen, UA: 2 E.U./dL — AB
pH, UA: 6 (ref 5.0–8.0)

## 2019-10-25 MED ORDER — NYSTATIN-TRIAMCINOLONE 100000-0.1 UNIT/GM-% EX OINT: 1.0000 "application " | TOPICAL_OINTMENT | Freq: Two times a day (BID) | CUTANEOUS | 0 refills | Status: DC

## 2019-10-25 MED ORDER — AZITHROMYCIN 250 MG PO TABS: ORAL_TABLET | ORAL | 0 refills | Status: DC

## 2019-10-25 MED ORDER — METRONIDAZOLE 375 MG PO CAPS: 375.0000 mg | ORAL_CAPSULE | Freq: Two times a day (BID) | ORAL | 0 refills | Status: DC

## 2019-10-25 NOTE — Progress Notes (Signed)
Established Patient Office Visit  Subjective:  Patient ID: Victoria Dalton, female    DOB: 04/23/1931  Age: 86 y.o. MRN: [id number]  CC:  Chief Complaint  Patient presents with  . Urinary Tract Infection    patient having trouble with urination     HPI  [name] presents for a burning and stinging pain during urination that began four days ago. She has tried drying the area with a hair dryer and using baby oil, but these have not helped. The patient has a history of UTIs and yeast infections, so she thought this was another yeast infection. She tried using Nystatin but it has not helped.  Past Medical History:  Diagnosis Date  . Diabetes mellitus without complication ([place])   . Dyspnea   . Gallbladder calculus   . GERD (gastroesophageal reflux disease)   . Hyperlipidemia   . Hypertension   . Kidney stone   . Mass   . Parotid swelling   . UTI (lower urinary tract infection)     Past Surgical History:  Procedure Laterality Date  . CHOLECYSTECTOMY N/A 04/30/2016   Procedure: CHOLECYSTECTOMY;  Surgeon: Jules Husbands, MD;  Location: [facility] ORS;  Service: General;  Laterality: N/A;  . DIAGNOSTIC LAPAROSCOPIC LIVER BIOPSY  02/28/2016   Procedure: DIAGNOSTIC LAPAROSCOPIC LIVER BIOPSY;  Surgeon: Jules Husbands, MD;  Location: [facility] ORS;  Service: General;;  . ERCP N/A 01/13/2016   Procedure: ENDOSCOPIC RETROGRADE CHOLANGIOPANCREATOGRAPHY (ERCP);  Surgeon: Lucilla Lame, MD;  Location: [facility] ENDOSCOPY;  Service: Endoscopy;  Laterality: N/A;  . ERCP N/A 04/08/2016   Procedure: ENDOSCOPIC RETROGRADE CHOLANGIOPANCREATOGRAPHY (ERCP) Stent removal;  Surgeon: Lucilla Lame, MD;  Location: [facility] ENDOSCOPY;  Service: Endoscopy;  Laterality: N/A;  . INTRAOPERATIVE CHOLANGIOGRAM  04/30/2016   Procedure: INTRAOPERATIVE CHOLANGIOGRAM;  Surgeon: Jules Husbands, MD;  Location: [facility] ORS;  Service: General;;  . KNEE ARTHROSCOPY Left   . LAPAROSCOPY  02/28/2016   Procedure: Diagnotic laparoscopy with  omental biopsy;  Surgeon: Jules Husbands, MD;  Location: [facility] ORS;  Service: General;;  . TONSILLECTOMY      Family History  Problem Relation Age of Onset  . Cancer Father   . Heart attack Father   . Cancer Brother   . Breast cancer Neg Hx     Social History   Socioeconomic History  . Marital status: Widowed    Spouse name: Not on file  . Number of children: Not on file  . Years of education: Not on file  . Highest education level: Not on file  Occupational History  . Not on file  Tobacco Use  . Smoking status: Never Smoker  . Smokeless tobacco: Never Used  Substance and Sexual Activity  . Alcohol use: No  . Drug use: No  . Sexual activity: Not on file  Other Topics Concern  . Not on file  Social History Narrative  . Not on file   Social Determinants of Health   Financial Resource Strain:   . Difficulty of Paying Living Expenses:   Food Insecurity:   . Worried About Charity fundraiser in the Last Year:   . Arboriculturist in the Last Year:   Transportation Needs:   . Film/video editor (Medical):   Marland Kitchen Lack of Transportation (Non-Medical):   Physical Activity:   . Days of Exercise per Week:   . Minutes of Exercise per Session:   Stress:   . Feeling of Stress :   Social Connections:   .  Frequency of Communication with Friends and Family:   . Frequency of Social Gatherings with Friends and Family:   . Attends Religious Services:   . Active Member of Clubs or Organizations:   . Attends Archivist Meetings:   Marland Kitchen Marital Status:   Intimate Partner Violence:   . Fear of Current or Ex-Partner:   . Emotionally Abused:   Marland Kitchen Physically Abused:   . Sexually Abused:      Current Outpatient Medications:  .  acetaminophen (TYLENOL) 500 MG tablet, Take 500 mg by mouth every 6 (six) hours as needed for mild pain., Disp: , Rfl:  .  aspirin EC 81 MG tablet, Take 81 mg by mouth daily., Disp: , Rfl:  .  calcium carbonate (TUMS - DOSED IN MG ELEMENTAL CALCIUM)  500 MG chewable tablet, Chew 2 tablets by mouth daily as needed for indigestion or heartburn., Disp: , Rfl:  .  glimepiride (AMARYL) 2 MG tablet, Take 1 mg by mouth every morning. , Disp: , Rfl:  .  losartan (COZAAR) 100 MG tablet, Take 100 mg by mouth daily., Disp: , Rfl:  .  metoprolol succinate (TOPROL-XL) 100 MG 24 hr tablet, Take 100 mg by mouth daily. , Disp: , Rfl:  .  Multiple Vitamins-Minerals (PRESERVISION AREDS PO), Take 1 capsule by mouth 2 (two) times daily. , Disp: , Rfl:  .  ondansetron (ZOFRAN) 4 MG tablet, Take 1 tablet (4 mg total) by mouth every 8 (eight) hours as needed for nausea or vomiting., Disp: 20 tablet, Rfl: 0 .  ONE TOUCH ULTRA TEST test strip, , Disp: , Rfl: 0 .  pantoprazole (PROTONIX) 40 MG tablet, TAKE 1 TABLET BY MOUTH DAILY, Disp: 30 tablet, Rfl: 6 .  simvastatin (ZOCOR) 40 MG tablet, Take 40 mg by mouth at bedtime., Disp: , Rfl: 0 .  sitaGLIPtin (JANUVIA) 50 MG tablet, Take 25 mg by mouth daily. , Disp: , Rfl:   Current Facility-Administered Medications:  .  ciprofloxacin (CIPRO) tablet 500 mg, 500 mg, Oral, Once, Stoioff, Scott C, MD .  lidocaine (XYLOCAINE) 2 % jelly 1 application, 1 application, Urethral, Once, Stoioff, Ronda Fairly, MD   Allergies  Allergen Reactions  . Chlorhexidine Itching  . [place] Extract]   . Ramipril Other (See Comments)    IRREGULAR HEART BEAT  . Penicillins Rash    Has patient had a PCN reaction causing immediate rash, facial/tongue/throat swelling, SOB or lightheadedness with hypotension: Yes Has patient had a PCN reaction causing severe rash involving mucus membranes or skin necrosis: Yes Has patient had a PCN reaction that required hospitalization No Has patient had a PCN reaction occurring within the last 10 years: No If all of the above answers are "NO", then may proceed with Cephalosporin use.   . Sulfa Antibiotics Rash    ROS Review of Systems  Constitutional: Negative.   HENT: Negative.     Eyes: Negative.   Respiratory: Negative.   Cardiovascular: Negative.   Gastrointestinal: Negative.   Endocrine: Negative.   Genitourinary: Positive for dysuria. Negative for difficulty urinating.  Musculoskeletal: Negative.   Skin: Negative.   Allergic/Immunologic: Negative.   Neurological: Negative.   Hematological: Negative.   Psychiatric/Behavioral: Negative.       Objective:    Physical Exam  Constitutional: She is oriented to person, place, and time. She appears well-developed and well-nourished.  HENT:  Head: Normocephalic and atraumatic.  Eyes: Pupils are equal, round, and reactive to light.  Neck: No JVD  present. No tracheal deviation present.  Cardiovascular: Normal rate.  Pulmonary/Chest: Effort normal.  Abdominal: Soft. There is no abdominal tenderness.  Genitourinary:    Genitourinary Comments: Pelvic examination done in lithotomy position does not have a rash outside but tender in the vaginal orifice.  No discharge was noted.   Neurological: She is alert and oriented to person, place, and time. No cranial nerve deficit.  Skin: Skin is warm and dry.    BP (!) 155/71   Pulse 71  Wt Readings from Last 3 Encounters:  08/20/19 250 lb (113.4 kg)  12/25/17 248 lb (112.5 kg)  03/25/17 248 lb 9.6 oz (112.8 kg)     Health Maintenance Due  Topic Date Due  . FOOT EXAM  Never done  . OPHTHALMOLOGY EXAM  Never done  . COVID-19 Vaccine (1) Never done  . DEXA SCAN  Never done  . PNA vac Low Risk Adult (2 of 2 - PCV13) 01/28/2016  . HEMOGLOBIN A1C  07/14/2016    There are no preventive care reminders to display for this patient.  No results found for: TSH Lab Results  Component Value Date   WBC 10.5 08/20/2019   HGB 13.3 08/20/2019   HCT 40.2 08/20/2019   MCV 91.0 08/20/2019   PLT 186 08/20/2019   Lab Results  Component Value Date   NA 135 08/20/2019   K 4.2 08/20/2019   CO2 25 08/20/2019   GLUCOSE 160 (H) 08/20/2019   BUN 12 08/20/2019   CREATININE  0.90 08/20/2019   BILITOT 0.8 03/16/2017   ALKPHOS 130 (H) 03/16/2017   AST 21 03/16/2017   ALT 14 03/16/2017   PROT 7.4 03/16/2017   ALBUMIN 4.1 03/16/2017   CALCIUM 8.8 (L) 08/20/2019   ANIONGAP 8 08/20/2019   No results found for: CHOL No results found for: HDL No results found for: LDLCALC No results found for: TRIG No results found for: CHOLHDL Lab Results  Component Value Date   HGBA1C 6.0 01/12/2016      Assessment & Plan:   Problem List Items Addressed This Visit    None      No orders of the defined types were placed in this encounter.  1. Urinary frequency Urine test was done but it was not sufficient for complete test.  She will bring back the urine specimen from home. - POCT urinalysis dipstick  2. Benign essential HTN Blood pressure stable*slightly elevated today because of the pain.  3. Moderate mixed hyperlipidemia not requiring statin therapy -Advised on diet.  4. Acute vaginitis   she was sent a prescription for  Flagyl Zithromax nystatin cream.  If she is not better she will be referred to gynecologist for further evaluation.  She will bring a specimen of urine and we can do an STD analysis on that.  Follow-up: No follow-ups on file.    Cletis Athens, MD

## 2019-10-25 NOTE — Telephone Encounter (Signed)
Patient came for apt today

## 2019-10-25 NOTE — Telephone Encounter (Signed)
Patient feels like she has a  Yeast infection and its getting worse.

## 2019-10-25 NOTE — Assessment & Plan Note (Signed)
No rash was noted.  She was very tender in the vaginal introitus

## 2019-10-25 NOTE — Assessment & Plan Note (Signed)
Chronic problem patient has been advised in the diet.  Advised to lose weight.

## 2019-10-28 ENCOUNTER — Other Ambulatory Visit: Payer: Self-pay | Admitting: *Deleted

## 2019-11-03 ENCOUNTER — Telehealth: Payer: Self-pay

## 2019-11-03 DIAGNOSIS — N760 Acute vaginitis: Secondary | ICD-10-CM

## 2019-11-03 DIAGNOSIS — R350 Frequency of micturition: Secondary | ICD-10-CM

## 2019-11-03 MED ORDER — PHENAZOPYRIDINE HCL 95 MG PO TABS: 95.0000 mg | ORAL_TABLET | Freq: Three times a day (TID) | ORAL | 0 refills | Status: DC | PRN

## 2019-11-03 NOTE — Telephone Encounter (Signed)
Pt is requesting a referral for urology. Pt has finished both antibiotics and is still having a lot of pain, especially when she urinates. [name] is requesting something for pain or something to help in the meantime while she waits for the referral. She uses [facility] pharmacy by [address] and would like for you to call her daughter Venida Jarvis [phone] if a rx is called in.

## 2019-11-03 NOTE — Telephone Encounter (Signed)
Referral to Urology has been placed. Per Dr. Lavera Guise he does not want to giver her another round of antibiotic, but advised to take AZO to relieve symptoms.

## 2019-11-04 ENCOUNTER — Ambulatory Visit: Payer: Medicare Other | Admitting: Urology

## 2019-11-04 ENCOUNTER — Other Ambulatory Visit: Payer: Self-pay

## 2019-11-04 ENCOUNTER — Encounter: Payer: Self-pay | Admitting: Urology

## 2019-11-04 VITALS — BP 149/81 | HR 76 | Ht 69.0 in | Wt 250.0 lb

## 2019-11-04 DIAGNOSIS — R300 Dysuria: Secondary | ICD-10-CM

## 2019-11-04 DIAGNOSIS — R310 Gross hematuria: Secondary | ICD-10-CM

## 2019-11-04 DIAGNOSIS — R102 Pelvic and perineal pain: Secondary | ICD-10-CM | POA: Diagnosis not present

## 2019-11-04 LAB — MICROSCOPIC EXAMINATION: RBC: NONE SEEN /hpf (ref 0–2)

## 2019-11-04 LAB — URINALYSIS, COMPLETE
Bilirubin, UA: NEGATIVE
Ketones, UA: NEGATIVE
Leukocytes,UA: NEGATIVE
Nitrite, UA: POSITIVE — AB
Protein,UA: NEGATIVE
RBC, UA: NEGATIVE
Specific Gravity, UA: 1.015 (ref 1.005–1.030)
Urobilinogen, Ur: 1 mg/dL (ref 0.2–1.0)
pH, UA: 5 (ref 5.0–7.5)

## 2019-11-04 NOTE — Progress Notes (Signed)
11/04/2019 2:49 PM   Victoria Dalton Jun 29, 1930 [id number]  Referring provider: Corky Downs, MD [address] [place],  [place] [address]  Chief Complaint  Patient presents with  . Urinary Frequency    Urologic history: 1.  History of gross hematuria -Initially seen 2018 -Had a renal stone protocol CT which showed normal upper tracts and a 3 cm hyperattenuating mass in the bladder -Cystoscopy unremarkable  HPI: 86 y.o. female presents for evaluation of dysuria.  -Seen in ED 08/21/2019 with dysuria followed by onset gross hematuria -Renal stone protocol CT performed in the ED which showed no upper tract abnormalities and diffuse bladder wall thickening -Urinalysis with >50 RBC/>50 WBC -Urine culture grew E. coli and Aerococcus species -Was treated with antibiotics however states has seen Dr. Juel Burrow on 2-3 occasions since that time for recurrent UTI -Seen on 6/1 and UA with moderate leukocytes on dipstick.  No microscopy or culture performed -Was diagnosed with vaginitis and treated with nystatin and metronidazole -Was seen yesterday complaining of significant dysuria and vaginal pain -Was started on fluconazole and feeling better -Denies recurrent gross hematuria  PMH: Past Medical History:  Diagnosis Date  . Diabetes mellitus without complication ([facility])   . Dyspnea   . Gallbladder calculus   . GERD (gastroesophageal reflux disease)   . Hyperlipidemia   . Hypertension   . Kidney stone   . Mass   . Parotid swelling   . UTI (lower urinary tract infection)     Surgical History: Past Surgical History:  Procedure Laterality Date  . CHOLECYSTECTOMY N/A 04/30/2016   Procedure: CHOLECYSTECTOMY;  Surgeon: Leafy Ro, MD;  Location: [facility] ORS;  Service: General;  Laterality: N/A;  . DIAGNOSTIC LAPAROSCOPIC LIVER BIOPSY  02/28/2016   Procedure: DIAGNOSTIC LAPAROSCOPIC LIVER BIOPSY;  Surgeon: Leafy Ro, MD;  Location: [facility] ORS;  Service: General;;  . ERCP N/A 01/13/2016    Procedure: ENDOSCOPIC RETROGRADE CHOLANGIOPANCREATOGRAPHY (ERCP);  Surgeon: Midge Minium, MD;  Location: [facility] ENDOSCOPY;  Service: Endoscopy;  Laterality: N/A;  . ERCP N/A 04/08/2016   Procedure: ENDOSCOPIC RETROGRADE CHOLANGIOPANCREATOGRAPHY (ERCP) Stent removal;  Surgeon: Midge Minium, MD;  Location: [facility] ENDOSCOPY;  Service: Endoscopy;  Laterality: N/A;  . INTRAOPERATIVE CHOLANGIOGRAM  04/30/2016   Procedure: INTRAOPERATIVE CHOLANGIOGRAM;  Surgeon: Leafy Ro, MD;  Location: [facility] ORS;  Service: General;;  . KNEE ARTHROSCOPY Left   . LAPAROSCOPY  02/28/2016   Procedure: Diagnotic laparoscopy with omental biopsy;  Surgeon: Leafy Ro, MD;  Location: [facility] ORS;  Service: General;;  . TONSILLECTOMY      Home Medications:  Allergies as of 11/04/2019      Reactions   Chlorhexidine Itching   Poison [place] Extract [poison Oak Extract]    Ramipril Other (See Comments)   IRREGULAR HEART BEAT   Penicillins Rash   Has patient had a PCN reaction causing immediate rash, facial/tongue/throat swelling, SOB or lightheadedness with hypotension: Yes Has patient had a PCN reaction causing severe rash involving mucus membranes or skin necrosis: Yes Has patient had a PCN reaction that required hospitalization No Has patient had a PCN reaction occurring within the last 10 years: No If all of the above answers are "NO", then may proceed with Cephalosporin use.   Sulfa Antibiotics Rash      Medication List       Accurate as of November 04, 2019  2:49 PM. If you have any questions, ask your nurse or doctor.        STOP taking these medications  azithromycin 250 MG tablet Commonly known as: ZITHROMAX Stopped by: Riki Altes, MD   metronidazole 375 MG capsule Commonly known as: Flagyl Stopped by: Riki Altes, MD     TAKE these medications   acetaminophen 500 MG tablet Commonly known as: TYLENOL Take 500 mg by mouth every 6 (six) hours as needed for mild pain.   aspirin EC 81 MG  tablet Take 81 mg by mouth daily.   calcium carbonate 500 MG chewable tablet Commonly known as: TUMS - dosed in mg elemental calcium Chew 2 tablets by mouth daily as needed for indigestion or heartburn.   Cozaar 100 MG tablet Generic drug: losartan Take 100 mg by mouth daily.   glimepiride 2 MG tablet Commonly known as: AMARYL Take 1 mg by mouth every morning.   metoprolol succinate 100 MG 24 hr tablet Commonly known as: TOPROL-XL Take 100 mg by mouth daily.   nystatin-triamcinolone ointment Commonly known as: MYCOLOG Apply 1 application topically 2 (two) times daily.   ondansetron 4 MG tablet Commonly known as: Zofran Take 1 tablet (4 mg total) by mouth every 8 (eight) hours as needed for nausea or vomiting.   ONE TOUCH ULTRA TEST test strip Generic drug: glucose blood   pantoprazole 40 MG tablet Commonly known as: PROTONIX TAKE 1 TABLET BY MOUTH DAILY   phenazopyridine 95 MG tablet Commonly known as: PYRIDIUM Take 1 tablet (95 mg total) by mouth 3 (three) times daily as needed for pain.   PRESERVISION AREDS PO Take 1 capsule by mouth 2 (two) times daily.   promethazine 12.5 MG tablet Commonly known as: PHENERGAN Take by mouth.   simvastatin 40 MG tablet Commonly known as: ZOCOR Take 40 mg by mouth at bedtime.   sitaGLIPtin 50 MG tablet Commonly known as: JANUVIA Take 25 mg by mouth daily.       Allergies:  Allergies  Allergen Reactions  . Chlorhexidine Itching  . Poison [place] Extract [employer] Extract]   . Ramipril Other (See Comments)    IRREGULAR HEART BEAT  . Penicillins Rash    Has patient had a PCN reaction causing immediate rash, facial/tongue/throat swelling, SOB or lightheadedness with hypotension: Yes Has patient had a PCN reaction causing severe rash involving mucus membranes or skin necrosis: Yes Has patient had a PCN reaction that required hospitalization No Has patient had a PCN reaction occurring within the last 10 years: No If all  of the above answers are "NO", then may proceed with Cephalosporin use.   . Sulfa Antibiotics Rash    Family History: Family History  Problem Relation Age of Onset  . Cancer Father   . Heart attack Father   . Cancer Brother   . Breast cancer Neg Hx     Social History:  reports that she has never smoked. She has never used smokeless tobacco. She reports that she does not drink alcohol and does not use drugs.   Physical Exam: BP (!) 149/81   Pulse 76   Ht 5\' 9"  (1.753 m)   Wt 250 lb (113.4 kg)   BMI 36.92 kg/m   Constitutional:  Alert and oriented, No acute distress. HEENT: [place] AT, moist mucus membranes.  Trachea midline, no masses. Cardiovascular: No clubbing, cyanosis, or edema. Respiratory: Normal respiratory effort, no increased work of breathing. Neurologic: Grossly intact, no focal deficits, moving all 4 extremities. Psychiatric: Normal mood and affect.  Laboratory Data:  Urinalysis Dipstick/microscopy negative   Assessment & Plan:    Michaell Cowing  hematuria Episode gross hematuria with March with a positive urine culture.  Some persistent dysuria and vaginal pain which may be related to vaginitis.  Given Uribel samples for her dysuria  Schedule cystoscopy  If symptoms secondary to vaginitis would recommend a gynecologic evaluation   Riki Altes, MD  [facility] [address] [place], [place] [address] [phone]

## 2019-11-08 ENCOUNTER — Telehealth: Payer: Self-pay | Admitting: *Deleted

## 2019-11-08 NOTE — Telephone Encounter (Signed)
Patient called in today and stay she thinks the Uribel samples are causing her back pain. Advised patient to stop taking them.  She is going to call her PCP for back pain .

## 2019-11-11 ENCOUNTER — Telehealth: Payer: Self-pay | Admitting: *Deleted

## 2019-11-11 NOTE — Telephone Encounter (Signed)
Patient called Triage line still having some dysuria when voiding. She stopped taking Uribel and pyridium due to back pain she was having-advised by her PCP. Is there anything else she can take or can prescribe? Please advise

## 2019-11-11 NOTE — Telephone Encounter (Signed)
There is nothing else for dysuria. I have never seen these medications caused back pain

## 2019-11-11 NOTE — Telephone Encounter (Signed)
Patient informed, will try OTC medications for dysuria. Will keep cysto appointment. Aware to go to ER if pain increases or is urgent.

## 2019-11-15 ENCOUNTER — Other Ambulatory Visit: Payer: Self-pay | Admitting: Internal Medicine

## 2019-11-16 ENCOUNTER — Other Ambulatory Visit: Payer: Self-pay

## 2019-11-16 ENCOUNTER — Encounter: Payer: Self-pay | Admitting: Internal Medicine

## 2019-11-16 ENCOUNTER — Ambulatory Visit (INDEPENDENT_AMBULATORY_CARE_PROVIDER_SITE_OTHER): Payer: Medicare Other | Admitting: Internal Medicine

## 2019-11-16 VITALS — BP 141/72 | HR 68

## 2019-11-16 DIAGNOSIS — E785 Hyperlipidemia, unspecified: Secondary | ICD-10-CM | POA: Diagnosis not present

## 2019-11-16 DIAGNOSIS — I10 Essential (primary) hypertension: Secondary | ICD-10-CM

## 2019-11-16 DIAGNOSIS — N3090 Cystitis, unspecified without hematuria: Secondary | ICD-10-CM | POA: Diagnosis not present

## 2019-11-16 DIAGNOSIS — E66813 Obesity, class 3: Secondary | ICD-10-CM | POA: Insufficient documentation

## 2019-11-16 NOTE — Assessment & Plan Note (Signed)
-   I encouraged the patient to lose weight.  - I educated them on making healthy dietary choices including eating more fruits and vegetables and less fried foods. - I encouraged the patient to exercise more, and educated on the benefits of exercise including weight loss, diabetes management, and hypertension.

## 2019-11-16 NOTE — Assessment & Plan Note (Signed)
Patient is going to see by urologist this Friday and he is planning to do a cystoscopy.

## 2019-11-16 NOTE — Assessment & Plan Note (Signed)
Patient is on statin.

## 2019-11-16 NOTE — Assessment & Plan Note (Signed)
-   Today, the patient's blood pressure is well managed on  Present  med. - The patient will continue the current treatment regimen.  - I encouraged the patient to eat a low-sodium diet to help control blood pressure. - I encouraged the patient to live an active lifestyle and complete activities that increases heart rate to 85% target heart rate at least 5 times per week for one hour.     

## 2019-11-16 NOTE — Progress Notes (Signed)
Established Patient Office Visit  SUBJECTIVE:  Subjective  Patient ID: Victoria Dalton, female    DOB: [birth year]  Age: 86 y.o. MRN: [id number]  CC:  Chief Complaint  Patient presents with  . Follow-up    patient here for follow up for urinary sxs. Patient has been referred to Urology and has cystoscopy scheduled for friday     HPI Victoria Dalton is a 86 y.o. female presenting today for urinary frequency and burning follow-up.  She will have a cystoscopy on 11/18/2019 for persistent frequency and burning with urination. She reports having these issues intermittently since she was catheterized during her visit to [facility] on 08/21/2019. She still reports intermittent pain in her left flank.  She is compliant with her medications. She is still ambulating with a walker. She has a history of kidney stones. She received her second COVID vaccine on 08/08/2019.  Past Medical History:  Diagnosis Date  . Diabetes mellitus without complication ([place])   . Dyspnea   . Gallbladder calculus   . GERD (gastroesophageal reflux disease)   . Hyperlipidemia   . Hypertension   . Kidney stone   . Mass   . Parotid swelling   . UTI (lower urinary tract infection)     Past Surgical History:  Procedure Laterality Date  . CHOLECYSTECTOMY N/A 04/30/2016   Procedure: CHOLECYSTECTOMY;  Surgeon: Jules Husbands, MD;  Location: [facility] ORS;  Service: General;  Laterality: N/A;  . DIAGNOSTIC LAPAROSCOPIC LIVER BIOPSY  02/28/2016   Procedure: DIAGNOSTIC LAPAROSCOPIC LIVER BIOPSY;  Surgeon: Jules Husbands, MD;  Location: [facility] ORS;  Service: General;;  . ERCP N/A 01/13/2016   Procedure: ENDOSCOPIC RETROGRADE CHOLANGIOPANCREATOGRAPHY (ERCP);  Surgeon: Lucilla Lame, MD;  Location: [facility] ENDOSCOPY;  Service: Endoscopy;  Laterality: N/A;  . ERCP N/A 04/08/2016   Procedure: ENDOSCOPIC RETROGRADE CHOLANGIOPANCREATOGRAPHY (ERCP) Stent removal;  Surgeon: Lucilla Lame, MD;  Location: [facility] ENDOSCOPY;  Service: Endoscopy;   Laterality: N/A;  . INTRAOPERATIVE CHOLANGIOGRAM  04/30/2016   Procedure: INTRAOPERATIVE CHOLANGIOGRAM;  Surgeon: Jules Husbands, MD;  Location: [facility] ORS;  Service: General;;  . KNEE ARTHROSCOPY Left   . LAPAROSCOPY  02/28/2016   Procedure: Diagnotic laparoscopy with omental biopsy;  Surgeon: Jules Husbands, MD;  Location: [facility] ORS;  Service: General;;  . TONSILLECTOMY      Family History  Problem Relation Age of Onset  . Cancer Father   . Heart attack Father   . Cancer Brother   . Breast cancer Neg Hx     Social History   Socioeconomic History  . Marital status: Widowed    Spouse name: Not on file  . Number of children: Not on file  . Years of education: Not on file  . Highest education level: Not on file  Occupational History  . Not on file  Tobacco Use  . Smoking status: Never Smoker  . Smokeless tobacco: Never Used  Substance and Sexual Activity  . Alcohol use: No  . Drug use: No  . Sexual activity: Not on file  Other Topics Concern  . Not on file  Social History Narrative  . Not on file   Social Determinants of Health   Financial Resource Strain:   . Difficulty of Paying Living Expenses:   Food Insecurity:   . Worried About Charity fundraiser in the Last Year:   . Arboriculturist in the Last Year:   Transportation Needs:   . Film/video editor (Medical):   Marland Kitchen Lack  of Transportation (Non-Medical):   Physical Activity:   . Days of Exercise per Week:   . Minutes of Exercise per Session:   Stress:   . Feeling of Stress :   Social Connections:   . Frequency of Communication with Friends and Family:   . Frequency of Social Gatherings with Friends and Family:   . Attends Religious Services:   . Active Member of Clubs or Organizations:   . Attends Archivist Meetings:   Marland Kitchen Marital Status:   Intimate Partner Violence:   . Fear of Current or Ex-Partner:   . Emotionally Abused:   Marland Kitchen Physically Abused:   . Sexually Abused:      Current Outpatient  Medications:  .  acetaminophen (TYLENOL) 500 MG tablet, Take 500 mg by mouth every 6 (six) hours as needed for mild pain., Disp: , Rfl:  .  aspirin EC 81 MG tablet, Take 81 mg by mouth daily., Disp: , Rfl:  .  calcium carbonate (TUMS - DOSED IN MG ELEMENTAL CALCIUM) 500 MG chewable tablet, Chew 2 tablets by mouth daily as needed for indigestion or heartburn., Disp: , Rfl:  .  glimepiride (AMARYL) 2 MG tablet, TAKE 1 TABLET BY MOUTH DAILY, Disp: 30 tablet, Rfl: 6 .  losartan (COZAAR) 100 MG tablet, TAKE 1 TABLET BY MOUTH DAILY, Disp: 30 tablet, Rfl: 6 .  metoprolol succinate (TOPROL-XL) 100 MG 24 hr tablet, Take 100 mg by mouth daily. , Disp: , Rfl:  .  Multiple Vitamins-Minerals (PRESERVISION AREDS PO), Take 1 capsule by mouth 2 (two) times daily. , Disp: , Rfl:  .  nystatin-triamcinolone ointment (MYCOLOG), Apply 1 application topically 2 (two) times daily., Disp: 30 g, Rfl: 0 .  ondansetron (ZOFRAN) 4 MG tablet, Take 1 tablet (4 mg total) by mouth every 8 (eight) hours as needed for nausea or vomiting., Disp: 20 tablet, Rfl: 0 .  ONE TOUCH ULTRA TEST test strip, , Disp: , Rfl: 0 .  pantoprazole (PROTONIX) 40 MG tablet, TAKE 1 TABLET BY MOUTH DAILY, Disp: 30 tablet, Rfl: 6 .  phenazopyridine (PYRIDIUM) 95 MG tablet, Take 1 tablet (95 mg total) by mouth 3 (three) times daily as needed for pain., Disp: 90 tablet, Rfl: 0 .  promethazine (PHENERGAN) 12.5 MG tablet, Take by mouth., Disp: , Rfl:  .  simvastatin (ZOCOR) 40 MG tablet, Take 40 mg by mouth at bedtime., Disp: , Rfl: 0 .  sitaGLIPtin (JANUVIA) 50 MG tablet, Take 25 mg by mouth daily. , Disp: , Rfl:    Allergies  Allergen Reactions  . Chlorhexidine Itching  . [place] Extract]   . Ramipril Other (See Comments)    IRREGULAR HEART BEAT  . Penicillins Rash    Has patient had a PCN reaction causing immediate rash, facial/tongue/throat swelling, SOB or lightheadedness with hypotension: Yes Has patient had a PCN reaction  causing severe rash involving mucus membranes or skin necrosis: Yes Has patient had a PCN reaction that required hospitalization No Has patient had a PCN reaction occurring within the last 10 years: No If all of the above answers are "NO", then may proceed with Cephalosporin use.   . Sulfa Antibiotics Rash    ROS Review of Systems  Constitutional: Negative.   HENT: Negative.   Eyes: Negative.   Respiratory: Negative.   Cardiovascular: Negative.   Gastrointestinal: Negative.   Endocrine: Negative.   Genitourinary: Positive for dysuria, flank pain and frequency.  Skin: Negative.   Allergic/Immunologic: Negative.  Neurological: Negative.   Hematological: Negative.   Psychiatric/Behavioral: Negative.   All other systems reviewed and are negative.    OBJECTIVE:    Physical Exam Vitals reviewed.  Constitutional:      Appearance: Normal appearance.  HENT:     Mouth/Throat:     Mouth: Mucous membranes are moist.  Eyes:     Pupils: Pupils are equal, round, and reactive to light.  Cardiovascular:     Rate and Rhythm: Normal rate and regular rhythm.     Pulses: Normal pulses.     Heart sounds: Normal heart sounds.  Pulmonary:     Effort: Pulmonary effort is normal.     Breath sounds: Normal breath sounds.  Abdominal:     Palpations: There is no hepatomegaly, splenomegaly or mass.     Tenderness: There is no abdominal tenderness. There is left CVA tenderness.  Musculoskeletal:     Right lower leg: No edema.     Left lower leg: No edema.  Neurological:     Mental Status: She is alert and oriented to person, place, and time.  Psychiatric:        Mood and Affect: Mood and affect normal.        Behavior: Behavior normal.     BP (!) 141/72   Pulse 68  Wt Readings from Last 3 Encounters:  11/04/19 250 lb (113.4 kg)  08/20/19 250 lb (113.4 kg)  12/25/17 248 lb (112.5 kg)    Health Maintenance Due  Topic Date Due  . FOOT EXAM  Never done  . OPHTHALMOLOGY EXAM  Never  done  . COVID-19 Vaccine (1) Never done  . DEXA SCAN  Never done  . PNA vac Low Risk Adult (2 of 2 - PCV13) 01/28/2016  . HEMOGLOBIN A1C  07/14/2016    There are no preventive care reminders to display for this patient.  CBC Latest Ref Rng & Units 08/20/2019 03/16/2017 06/05/2016  WBC 4.0 - 10.5 K/uL 10.5 12.5(H) 20.1(H)  Hemoglobin 12.0 - 15.0 g/dL 13.3 14.2 13.6  Hematocrit 36 - 46 % 40.2 43.1 40.6  Platelets 150 - 400 K/uL 186 186 158   CMP Latest Ref Rng & Units 08/20/2019 03/16/2017 06/05/2016  Glucose 70 - 99 mg/dL 160(H) 135(H) 127(H)  BUN 8 - 23 mg/dL 12 20 20   Creatinine 0.44 - 1.00 mg/dL 0.90 1.20(H) 1.07(H)  Sodium 135 - 145 mmol/L 135 139 139  Potassium 3.5 - 5.1 mmol/L 4.2 4.7 4.5  Chloride 98 - 111 mmol/L 102 105 106  CO2 22 - 32 mmol/L 25 29 27   Calcium 8.9 - 10.3 mg/dL 8.8(L) 9.4 9.0  Total Protein 6.5 - 8.1 g/dL - 7.4 -  Total Bilirubin 0.3 - 1.2 mg/dL - 0.8 -  Alkaline Phos 38 - 126 U/L - 130(H) -  AST 15 - 41 U/L - 21 -  ALT 14 - 54 U/L - 14 -    No results found for: TSH Lab Results  Component Value Date   ALBUMIN 4.1 03/16/2017   ANIONGAP 8 08/20/2019   No results found for: CHOL, HDL, LDLCALC, CHOLHDL No results found for: TRIG Lab Results  Component Value Date   HGBA1C 6.0 01/12/2016      ASSESSMENT & PLAN:   Problem List Items Addressed This Visit      Cardiovascular and Mediastinum   Benign essential HTN    - Today, the patient's blood pressure is well managed on  Present med - The patient will continue the  current treatment regimen.  - I encouraged the patient to eat a low-sodium diet to help control blood pressure. - I encouraged the patient to live an active lifestyle and complete activities that increases heart rate to 85% target heart rate at least 5 times per week for one hour.           Genitourinary   Cystitis - Primary    Patient is going to see by urologist this Friday and he is planning to do a cystoscopy.        Other     Dyslipidemia    Patient is on statin.      Class 3 severe obesity due to excess calories in adult [facility])    - I encouraged the patient to lose weight.  - I educated them on making healthy dietary choices including eating more fruits and vegetables and less fried foods. - I encouraged the patient to exercise more, and educated on the benefits of exercise including weight loss, diabetes management, and hypertension.          No orders of the defined types were placed in this encounter.   Cystitis  Class 3 severe obesity due to excess calories in adult, unspecified BMI, unspecified whether serious comorbidity present ([place])  Benign essential HTN  Dyslipidemia   Follow-up: Return in about 2 months (around 01/16/2020).    Dr. Jane Canary [facility] [address], [place], [place] [address]   By signing my name below, I, Milinda Antis, attest that this documentation has been prepared under the direction and in the presence of Cletis Athens, MD. Electronically Signed: Cletis Athens, MD 11/16/19, 11:11 AM   I personally performed the services described in this documentation, which was SCRIBED in my presence. The recorded information has been reviewed and considered accurate. It has been edited as necessary during review. Cletis Athens, MD

## 2019-11-18 ENCOUNTER — Encounter: Payer: Self-pay | Admitting: Urology

## 2019-11-18 ENCOUNTER — Ambulatory Visit: Payer: Medicare Other | Admitting: Urology

## 2019-11-18 ENCOUNTER — Other Ambulatory Visit: Payer: Self-pay

## 2019-11-18 VITALS — BP 135/85 | HR 61 | Ht 67.0 in | Wt 250.0 lb

## 2019-11-18 DIAGNOSIS — R310 Gross hematuria: Secondary | ICD-10-CM | POA: Diagnosis not present

## 2019-11-18 DIAGNOSIS — R102 Pelvic and perineal pain: Secondary | ICD-10-CM

## 2019-11-18 NOTE — Progress Notes (Signed)
   11/18/19  CC:  Chief Complaint  Patient presents with  . Cysto    HPI: Gross hematuria in association with UTI.  Vaginal pain/dysuria treated for vaginitis by PCP  Blood pressure 135/85, pulse 61, height 5\' 7"  (1.702 m), weight 250 lb (113.4 kg). NED. A&Ox3.   No respiratory distress   Abd soft, NT, ND Mild erythematous vaginal mucosa with patent urethral meatus  Cystoscopy Procedure Note  Patient identification was confirmed, informed consent was obtained, and patient was prepped using Betadine solution.  Lidocaine jelly was administered per urethral meatus.    Procedure: - Flexible cystoscope introduced, without any difficulty.   - Thorough search of the bladder revealed:    normal urethral meatus    normal urothelium    no stones    no ulcers     no tumors    no urethral polyps    no trabeculation  - Ureteral orifices were normal in position and appearance.  Post-Procedure: - Patient tolerated the procedure well  Assessment/ Plan: -No mucosal or urethral abnormalities noted on cystoscopy -She is still having vaginal pain and requested a gynecology referral   Abbie Sons, MD

## 2019-11-21 ENCOUNTER — Telehealth: Payer: Self-pay | Admitting: Obstetrics & Gynecology

## 2019-11-21 LAB — URINALYSIS, COMPLETE
Bilirubin, UA: NEGATIVE
Glucose, UA: NEGATIVE
Ketones, UA: NEGATIVE
Leukocytes,UA: NEGATIVE
Nitrite, UA: NEGATIVE
Protein,UA: NEGATIVE
RBC, UA: NEGATIVE
Specific Gravity, UA: 1.01 (ref 1.005–1.030)
Urobilinogen, Ur: 0.2 mg/dL (ref 0.2–1.0)
pH, UA: 6.5 (ref 5.0–7.5)

## 2019-11-21 LAB — MICROSCOPIC EXAMINATION
Bacteria, UA: NONE SEEN
RBC: NONE SEEN /hpf (ref 0–2)

## 2019-11-21 NOTE — Telephone Encounter (Signed)
[facility] referring for Vaginal pain. Called and left voicemail for patient to call back to be scheduled.

## 2019-11-23 ENCOUNTER — Other Ambulatory Visit: Payer: Self-pay | Admitting: Internal Medicine

## 2019-11-23 NOTE — Telephone Encounter (Signed)
Called and left voicemail for patient to call back to be scheduled. 

## 2019-11-24 NOTE — Telephone Encounter (Signed)
Called and left voicemail for patient to call back to be scheduled. 

## 2019-11-30 ENCOUNTER — Ambulatory Visit: Payer: Medicare Other | Admitting: Obstetrics and Gynecology

## 2019-11-30 ENCOUNTER — Encounter: Payer: Self-pay | Admitting: Obstetrics and Gynecology

## 2019-11-30 ENCOUNTER — Other Ambulatory Visit: Payer: Self-pay

## 2019-11-30 VITALS — BP 160/86 | HR 61 | Ht 69.0 in | Wt 262.0 lb

## 2019-11-30 DIAGNOSIS — N952 Postmenopausal atrophic vaginitis: Secondary | ICD-10-CM | POA: Diagnosis not present

## 2019-11-30 DIAGNOSIS — N9089 Other specified noninflammatory disorders of vulva and perineum: Secondary | ICD-10-CM | POA: Diagnosis not present

## 2019-11-30 DIAGNOSIS — N390 Urinary tract infection, site not specified: Secondary | ICD-10-CM

## 2019-11-30 DIAGNOSIS — N905 Atrophy of vulva: Secondary | ICD-10-CM

## 2019-11-30 DIAGNOSIS — R300 Dysuria: Secondary | ICD-10-CM | POA: Diagnosis not present

## 2019-11-30 NOTE — Progress Notes (Signed)
Obstetrics & Gynecology Office Visit   Chief Complaint  Patient presents with  . Vaginal Pain    Referred by [facility], see notes   The patient is seen in referral at the request of Cletis Athens, MD from [facility] for recurrent urinary tract infections, vaginal pain and burning.  History of Present Illness: 86 y.o. female who is seen in referral at the request of Cletis Athens, MD from [facility] for recurrent urinary tract infections, vaginal pain and burning.   She believes this started in March.  She noted blood when she voided.  She went to the ER and had no evidence of kidney stones.  She had bladder changes that might have been consistent with a UTI.  She was given a dose of ceftriaxone and sent home with Keflex.  She states she took the antibiotics.  That night a catheter was placed due to her frequent need to void.    She presented to her PCP due to stinging and discomfort with voiding.  This appears to have started in late May.  She was given Flagyl and, Nystatin cream.  She was later treated with fluconazole.    She was seen by Urology on 6/11 and prescribed Uribel and scheduled for a cystoscopy, which appeared normal.   She continued having stinging and this stopped a few days ago.   She denies abnormal vaginal discharge and vaginal irritative symptoms.    Past Medical History:  Diagnosis Date  . Diabetes mellitus without complication ([place])   . Dyspnea   . Gallbladder calculus   . GERD (gastroesophageal reflux disease)   . Hyperlipidemia   . Hypertension   . Kidney stone   . Mass   . Parotid swelling   . UTI (lower urinary tract infection)     Past Surgical History:  Procedure Laterality Date  . CHOLECYSTECTOMY N/A 04/30/2016   Procedure: CHOLECYSTECTOMY;  Surgeon: Jules Husbands, MD;  Location: [facility] ORS;  Service: General;  Laterality: N/A;  . DIAGNOSTIC LAPAROSCOPIC LIVER BIOPSY  02/28/2016   Procedure: DIAGNOSTIC LAPAROSCOPIC  LIVER BIOPSY;  Surgeon: Jules Husbands, MD;  Location: [facility] ORS;  Service: General;;  . ERCP N/A 01/13/2016   Procedure: ENDOSCOPIC RETROGRADE CHOLANGIOPANCREATOGRAPHY (ERCP);  Surgeon: Lucilla Lame, MD;  Location: [facility] ENDOSCOPY;  Service: Endoscopy;  Laterality: N/A;  . ERCP N/A 04/08/2016   Procedure: ENDOSCOPIC RETROGRADE CHOLANGIOPANCREATOGRAPHY (ERCP) Stent removal;  Surgeon: Lucilla Lame, MD;  Location: [facility] ENDOSCOPY;  Service: Endoscopy;  Laterality: N/A;  . INTRAOPERATIVE CHOLANGIOGRAM  04/30/2016   Procedure: INTRAOPERATIVE CHOLANGIOGRAM;  Surgeon: Jules Husbands, MD;  Location: [facility] ORS;  Service: General;;  . KNEE ARTHROSCOPY Left   . LAPAROSCOPY  02/28/2016   Procedure: Diagnotic laparoscopy with omental biopsy;  Surgeon: Jules Husbands, MD;  Location: [facility] ORS;  Service: General;;  . TONSILLECTOMY      Gynecologic History: No LMP recorded. Patient is postmenopausal.  Family History  Problem Relation Age of Onset  . Cancer Father   . Heart attack Father   . Cancer Brother   . Breast cancer Neg Hx     Social History   Socioeconomic History  . Marital status: Widowed    Spouse name: Not on file  . Number of children: Not on file  . Years of education: Not on file  . Highest education level: Not on file  Occupational History  . Not on file  Tobacco Use  . Smoking status: Never  Smoker  . Smokeless tobacco: Never Used  Substance and Sexual Activity  . Alcohol use: No  . Drug use: No  . Sexual activity: Not on file  Other Topics Concern  . Not on file  Social History Narrative  . Not on file   Social Determinants of Health   Financial Resource Strain:   . Difficulty of Paying Living Expenses:   Food Insecurity:   . Worried About Charity fundraiser in the Last Year:   . Arboriculturist in the Last Year:   Transportation Needs:   . Film/video editor (Medical):   Marland Kitchen Lack of Transportation (Non-Medical):   Physical Activity:   . Days of Exercise per Week:   .  Minutes of Exercise per Session:   Stress:   . Feeling of Stress :   Social Connections:   . Frequency of Communication with Friends and Family:   . Frequency of Social Gatherings with Friends and Family:   . Attends Religious Services:   . Active Member of Clubs or Organizations:   . Attends Archivist Meetings:   Marland Kitchen Marital Status:   Intimate Partner Violence:   . Fear of Current or Ex-Partner:   . Emotionally Abused:   Marland Kitchen Physically Abused:   . Sexually Abused:     Allergies  Allergen Reactions  . Chlorhexidine Itching  . [place] Extract]   . Ramipril Other (See Comments)    IRREGULAR HEART BEAT  . Penicillins Rash    Has patient had a PCN reaction causing immediate rash, facial/tongue/throat swelling, SOB or lightheadedness with hypotension: Yes Has patient had a PCN reaction causing severe rash involving mucus membranes or skin necrosis: Yes Has patient had a PCN reaction that required hospitalization No Has patient had a PCN reaction occurring within the last 10 years: No If all of the above answers are "NO", then may proceed with Cephalosporin use.   . Sulfa Antibiotics Rash    Prior to Admission medications   Medication Sig Start Date End Date Taking? Authorizing Provider  acetaminophen (TYLENOL) 500 MG tablet Take 500 mg by mouth every 6 (six) hours as needed for mild pain.   Yes [provider]  aspirin EC 81 MG tablet Take 81 mg by mouth daily.   Yes [provider]  calcium carbonate (TUMS - DOSED IN MG ELEMENTAL CALCIUM) 500 MG chewable tablet Chew 2 tablets by mouth daily as needed for indigestion or heartburn.   Yes [provider]  glimepiride (AMARYL) 2 MG tablet TAKE 1 TABLET BY MOUTH DAILY 11/15/19  Yes Masoud, Viann Shove, MD  losartan (COZAAR) 100 MG tablet TAKE 1 TABLET BY MOUTH DAILY 11/15/19  Yes Masoud, Viann Shove, MD  metoprolol succinate (TOPROL-XL) 100 MG 24 hr tablet TAKE 1 TABLET BY MOUTH DAILY 11/23/19   Yes Masoud, Viann Shove, MD  metoprolol succinate (TOPROL-XL) 100 MG 24 hr tablet TAKE 1 TABLET BY MOUTH DAILY 11/23/19  Yes Masoud, Viann Shove, MD  Multiple Vitamins-Minerals (PRESERVISION AREDS PO) Take 1 capsule by mouth 2 (two) times daily.    Yes [provider]  nystatin-triamcinolone ointment (MYCOLOG) Apply 1 application topically 2 (two) times daily. 10/25/19  Yes Masoud, Viann Shove, MD  ondansetron (ZOFRAN) 4 MG tablet Take 1 tablet (4 mg total) by mouth every 8 (eight) hours as needed for nausea or vomiting. 06/05/16  Yes Rudene Re, MD  ONE TOUCH ULTRA TEST test strip  11/02/15  Yes [provider]  pantoprazole ([place])  40 MG tablet TAKE 1 TABLET BY MOUTH DAILY 10/25/19  Yes Masoud, Viann Shove, MD  phenazopyridine (PYRIDIUM) 95 MG tablet Take 1 tablet (95 mg total) by mouth 3 (three) times daily as needed for pain. 11/03/19  Yes Masoud, Viann Shove, MD  promethazine (PHENERGAN) 12.5 MG tablet Take by mouth. 09/29/15  Yes [provider]  simvastatin (ZOCOR) 40 MG tablet Take 40 mg by mouth at bedtime. 10/12/15  Yes [provider]  sitaGLIPtin (JANUVIA) 50 MG tablet Take 25 mg by mouth daily.    Yes [provider]    Review of Systems  Constitutional: Negative.   HENT: Negative.   Eyes: Negative.   Respiratory: Negative.   Cardiovascular: Negative.   Gastrointestinal: Negative.   Genitourinary: Negative.   Musculoskeletal: Negative.   Skin: Negative.   Neurological: Negative.   Psychiatric/Behavioral: Negative.      Physical Exam BP (!) 160/86   Pulse 61   Ht 5\' 9"  (1.753 m)   Wt 262 lb (118.8 kg)   BMI 38.69 kg/m  No LMP recorded. Patient is postmenopausal. Physical Exam Constitutional:      General: She is not in acute distress.    Appearance: Normal appearance. She is well-developed.  Genitourinary:     Pelvic exam was performed with patient in the lithotomy position.     Vulva, inguinal canal, urethra, bladder, vagina, uterus, right adnexa and  left adnexa normal.     No posterior fourchette tenderness, injury or lesion present.     No cervical friability, lesion, bleeding or polyp.     Genitourinary Comments: Exam limited by body habitus  HENT:     Head: Normocephalic and atraumatic.  Eyes:     General: No scleral icterus.    Conjunctiva/sclera: Conjunctivae normal.  Cardiovascular:     Rate and Rhythm: Normal rate and regular rhythm.     Heart sounds: No murmur heard.  No friction rub. No gallop.   Pulmonary:     Effort: Pulmonary effort is normal. No respiratory distress.     Breath sounds: Normal breath sounds. No wheezing or rales.  Abdominal:     General: Bowel sounds are normal. There is no distension.     Palpations: Abdomen is soft. There is no mass.     Tenderness: There is no abdominal tenderness. There is no guarding or rebound.  Musculoskeletal:        General: Normal range of motion.     Cervical back: Normal range of motion and neck supple.  Neurological:     General: No focal deficit present.     Mental Status: She is alert and oriented to person, place, and time.     Cranial Nerves: No cranial nerve deficit.  Skin:    General: Skin is warm and dry.     Findings: No erythema.  Psychiatric:        Mood and Affect: Mood normal.        Behavior: Behavior normal.        Judgment: Judgment normal.     Female chaperone present for pelvic and breast  portions of the physical exam  Assessment: 86 y.o. No obstetric history on file. female here for  1. Recurrent UTI   2. Dysuria   3. Vulvar irritation   4. Vulvar atrophy   5. Vaginal atrophy      Plan: Problem List Items Addressed This Visit    None    Visit Diagnoses    Recurrent UTI    -  Primary   Relevant Medications   conjugated estrogens (PREMARIN) vaginal cream   Dysuria       Vulvar irritation       Relevant Medications   conjugated estrogens (PREMARIN) vaginal cream   Vulvar atrophy       Relevant Medications   conjugated estrogens  (PREMARIN) vaginal cream   Vaginal atrophy       Relevant Medications   conjugated estrogens (PREMARIN) vaginal cream     No symptoms currently. No evidence of malignancy to explain bleeding. Evidence of vulvar and vaginal atrophy.  Discussed the use of topical estrogen to affect improvement in vulvar symptoms and there is evidence that supports the use of topical estrogen to reduce the incidence of recurrent urinary tract infections. She has no contraindications to estrogen in topical form.   A total of 30 minutes were spent face-to-face with the patient as well as preparation, review, communication, and documentation during this encounter.    Prentice Docker, MD 11/30/2019 3:44 PM     CC: Cletis Athens, MD [facility] [address] [place],  [place] [address]

## 2019-12-01 ENCOUNTER — Encounter: Payer: Self-pay | Admitting: Obstetrics and Gynecology

## 2019-12-01 MED ORDER — ESTROGENS, CONJUGATED 0.625 MG/GM VA CREA: 1.0000 | TOPICAL_CREAM | VAGINAL | 3 refills | Status: DC

## 2020-01-02 ENCOUNTER — Other Ambulatory Visit: Payer: Self-pay

## 2020-01-02 MED ORDER — GLUCOSE BLOOD VI STRP
ORAL_STRIP | 6 refills | Status: DC
Start: 2020-01-02 — End: 2021-01-29

## 2020-01-16 ENCOUNTER — Other Ambulatory Visit: Payer: Self-pay

## 2020-01-16 ENCOUNTER — Encounter: Payer: Self-pay | Admitting: Internal Medicine

## 2020-01-16 ENCOUNTER — Ambulatory Visit (INDEPENDENT_AMBULATORY_CARE_PROVIDER_SITE_OTHER): Payer: Medicare Other | Admitting: Internal Medicine

## 2020-01-16 VITALS — BP 146/67 | HR 70 | Ht 69.0 in | Wt 263.1 lb

## 2020-01-16 DIAGNOSIS — E785 Hyperlipidemia, unspecified: Secondary | ICD-10-CM

## 2020-01-16 DIAGNOSIS — K219 Gastro-esophageal reflux disease without esophagitis: Secondary | ICD-10-CM

## 2020-01-16 DIAGNOSIS — E119 Type 2 diabetes mellitus without complications: Secondary | ICD-10-CM

## 2020-01-16 DIAGNOSIS — I10 Essential (primary) hypertension: Secondary | ICD-10-CM | POA: Diagnosis not present

## 2020-01-16 MED ORDER — PANTOPRAZOLE SODIUM 40 MG PO TBEC: 40.0000 mg | DELAYED_RELEASE_TABLET | Freq: Every day | ORAL | 6 refills | Status: DC

## 2020-01-16 NOTE — Assessment & Plan Note (Signed)
-   I encouraged the patient to lose weight.  - I educated them on making healthy dietary choices including eating more fruits and vegetables and less fried foods. - I encouraged the patient to exercise more, and educated on the benefits of exercise including weight loss, diabetes management, and hypertension management.   

## 2020-01-16 NOTE — Assessment & Plan Note (Signed)
-   The patient's blood sugar is under control on sulfanurea and  [place]. - The patient will continue the current treatment regimen.  - I encouraged the patient to regularly check blood sugar.  - I encouraged the patient to monitor diet. I encouraged the patient to eat low-carb and low-sugar to help prevent blood sugar spikes.  - I encouraged the patient to continue following their prescribed treatment plan for diabetes - I informed the patient to get help if blood sugar drops below 54mg /dL, or if suddenly have trouble thinking clearly or breathing.

## 2020-01-16 NOTE — Assessment & Plan Note (Signed)
-   The patient's GERD is stable on medication.  - Instructed the patient to avoid eating spicy and acidic foods, as well as foods high in fat. - Instructed the patient to avoid eating large meals or meals 2-3 hours prior to sleeping. 

## 2020-01-16 NOTE — Assessment & Plan Note (Signed)
Diet explained / cont  statin

## 2020-01-16 NOTE — Progress Notes (Signed)
Established Patient Office Visit  Subjective:  Patient ID: Victoria Dalton, female    DOB: 1930-10-29  Age: 86 y.o. MRN: [id number]  CC:  Chief Complaint  Patient presents with  . Diabetes    patient checked blood sugar from home and it was 125     Diabetes She presents for her follow-up diabetic visit. She has type 2 diabetes mellitus. Her disease course has been stable. Hypoglycemia symptoms include hunger and sweats. Pertinent negatives for hypoglycemia include no confusion, dizziness, headaches, mood changes, seizures or speech difficulty. Associated symptoms include polyphagia. Pertinent negatives for diabetes include no chest pain, no fatigue, no foot paresthesias, no visual change and no weakness. Symptoms are stable. Pertinent negatives for diabetic complications include no autonomic neuropathy, CVA or heart disease. Risk factors for coronary artery disease include obesity. Her home blood glucose trend is fluctuating minimally.    Victoria Dalton presents for a follow up. She reports shortness of breath on exertion. Denies chest pain, leg swelling, eye problems, ear problems, and kidney problems. She is having trouble losing weight because she eats often. She has never had a heart attack. She does not smoke or chew tobacco.  The patient check her blood sugars every morning and it usually runs in the 130s. This morning, it was 125.  The patient was recently given Premarin vaginal cream by her OBGYN to help prevent UTIs. She uses it 2x/week.  She does not perform regular breast self exams.  Past Medical History:  Diagnosis Date  . Diabetes mellitus without complication ([place])   . Dyspnea   . Gallbladder calculus   . GERD (gastroesophageal reflux disease)   . Hyperlipidemia   . Hypertension   . Kidney stone   . Mass   . Parotid swelling   . UTI (lower urinary tract infection)     Past Surgical History:  Procedure Laterality Date  . CHOLECYSTECTOMY N/A 04/30/2016    Procedure: CHOLECYSTECTOMY;  Surgeon: Jules Husbands, MD;  Location: [facility] ORS;  Service: General;  Laterality: N/A;  . DIAGNOSTIC LAPAROSCOPIC LIVER BIOPSY  02/28/2016   Procedure: DIAGNOSTIC LAPAROSCOPIC LIVER BIOPSY;  Surgeon: Jules Husbands, MD;  Location: [facility] ORS;  Service: General;;  . ERCP N/A 01/13/2016   Procedure: ENDOSCOPIC RETROGRADE CHOLANGIOPANCREATOGRAPHY (ERCP);  Surgeon: Lucilla Lame, MD;  Location: [facility] ENDOSCOPY;  Service: Endoscopy;  Laterality: N/A;  . ERCP N/A 04/08/2016   Procedure: ENDOSCOPIC RETROGRADE CHOLANGIOPANCREATOGRAPHY (ERCP) Stent removal;  Surgeon: Lucilla Lame, MD;  Location: [facility] ENDOSCOPY;  Service: Endoscopy;  Laterality: N/A;  . INTRAOPERATIVE CHOLANGIOGRAM  04/30/2016   Procedure: INTRAOPERATIVE CHOLANGIOGRAM;  Surgeon: Jules Husbands, MD;  Location: [facility] ORS;  Service: General;;  . KNEE ARTHROSCOPY Left   . LAPAROSCOPY  02/28/2016   Procedure: Diagnotic laparoscopy with omental biopsy;  Surgeon: Jules Husbands, MD;  Location: [facility] ORS;  Service: General;;  . TONSILLECTOMY      Family History  Problem Relation Age of Onset  . Cancer Father   . Heart attack Father   . Cancer Brother   . Breast cancer Neg Hx     Social History   Socioeconomic History  . Marital status: Widowed    Spouse name: Not on file  . Number of children: Not on file  . Years of education: Not on file  . Highest education level: Not on file  Occupational History  . Not on file  Tobacco Use  . Smoking status: Never Smoker  . Smokeless tobacco: Never Used  Substance and Sexual Activity  . Alcohol use: No  . Drug use: No  . Sexual activity: Not on file  Other Topics Concern  . Not on file  Social History Narrative  . Not on file   Social Determinants of Health   Financial Resource Strain:   . Difficulty of Paying Living Expenses: Not on file  Food Insecurity:   . Worried About Charity fundraiser in the Last Year: Not on file  . Ran Out of Food in the Last Year: Not on  file  Transportation Needs:   . Lack of Transportation (Medical): Not on file  . Lack of Transportation (Non-Medical): Not on file  Physical Activity:   . Days of Exercise per Week: Not on file  . Minutes of Exercise per Session: Not on file  Stress:   . Feeling of Stress : Not on file  Social Connections:   . Frequency of Communication with Friends and Family: Not on file  . Frequency of Social Gatherings with Friends and Family: Not on file  . Attends Religious Services: Not on file  . Active Member of Clubs or Organizations: Not on file  . Attends Archivist Meetings: Not on file  . Marital Status: Not on file  Intimate Partner Violence:   . Fear of Current or Ex-Partner: Not on file  . Emotionally Abused: Not on file  . Physically Abused: Not on file  . Sexually Abused: Not on file     Current Outpatient Medications:  .  acetaminophen (TYLENOL) 500 MG tablet, Take 500 mg by mouth every 6 (six) hours as needed for mild pain., Disp: , Rfl:  .  aspirin EC 81 MG tablet, Take 81 mg by mouth daily., Disp: , Rfl:  .  calcium carbonate (TUMS - DOSED IN MG ELEMENTAL CALCIUM) 500 MG chewable tablet, Chew 2 tablets by mouth daily as needed for indigestion or heartburn., Disp: , Rfl:  .  conjugated estrogens (PREMARIN) vaginal cream, Place 1 Applicatorful vaginally 2 (two) times a week. 1 gram vaginally at bedtime twice weekly, Disp: 30 g, Rfl: 3 .  glimepiride (AMARYL) 2 MG tablet, TAKE 1 TABLET BY MOUTH DAILY, Disp: 30 tablet, Rfl: 6 .  glucose blood test strip, Use as instructed, Disp: 100 each, Rfl: 6 .  losartan (COZAAR) 100 MG tablet, TAKE 1 TABLET BY MOUTH DAILY, Disp: 30 tablet, Rfl: 6 .  metoprolol succinate (TOPROL-XL) 100 MG 24 hr tablet, TAKE 1 TABLET BY MOUTH DAILY, Disp: 30 tablet, Rfl: 3 .  metoprolol succinate (TOPROL-XL) 100 MG 24 hr tablet, TAKE 1 TABLET BY MOUTH DAILY, Disp: 30 tablet, Rfl: 3 .  Multiple Vitamins-Minerals (PRESERVISION AREDS PO), Take 1 capsule  by mouth 2 (two) times daily. , Disp: , Rfl:  .  nystatin-triamcinolone ointment (MYCOLOG), Apply 1 application topically 2 (two) times daily., Disp: 30 g, Rfl: 0 .  ondansetron (ZOFRAN) 4 MG tablet, Take 1 tablet (4 mg total) by mouth every 8 (eight) hours as needed for nausea or vomiting., Disp: 20 tablet, Rfl: 0 .  ONE TOUCH ULTRA TEST test strip, , Disp: , Rfl: 0 .  pantoprazole (PROTONIX) 40 MG tablet, Take 1 tablet (40 mg total) by mouth daily., Disp: 30 tablet, Rfl: 6 .  phenazopyridine (PYRIDIUM) 95 MG tablet, Take 1 tablet (95 mg total) by mouth 3 (three) times daily as needed for pain., Disp: 90 tablet, Rfl: 0 .  promethazine (PHENERGAN) 12.5 MG tablet, Take by mouth., Disp: , Rfl:  .  simvastatin (ZOCOR) 40 MG tablet, Take 40 mg by mouth at bedtime., Disp: , Rfl: 0 .  sitaGLIPtin (JANUVIA) 50 MG tablet, Take 25 mg by mouth daily. , Disp: , Rfl:    Allergies  Allergen Reactions  . Chlorhexidine Itching  . Stella Extract]   . Ramipril Other (See Comments)    IRREGULAR HEART BEAT  . Penicillins Rash    Has patient had a PCN reaction causing immediate rash, facial/tongue/throat swelling, SOB or lightheadedness with hypotension: Yes Has patient had a PCN reaction causing severe rash involving mucus membranes or skin necrosis: Yes Has patient had a PCN reaction that required hospitalization No Has patient had a PCN reaction occurring within the last 10 years: No If all of the above answers are "NO", then may proceed with Cephalosporin use.   . Sulfa Antibiotics Rash    ROS Review of Systems  Constitutional: Negative for fatigue and unexpected weight change.  Respiratory: Positive for shortness of breath (on exertion).   Cardiovascular: Negative for chest pain and leg swelling.  Endocrine: Positive for polyphagia.  Genitourinary:       Recurrent UTIs, on Premarin vaginal cream.  Allergic/Immunologic:       Diabetes.  Neurological: Negative for  dizziness, seizures, speech difficulty, weakness and headaches.  Psychiatric/Behavioral: Negative for confusion.  All other systems reviewed and are negative.     Objective:    Physical Exam Vitals and nursing note reviewed.  Constitutional:      General: She is not in acute distress.    Appearance: She is not diaphoretic.  HENT:     Head: Normocephalic and atraumatic.  Neck:     Vascular: No carotid bruit.  Cardiovascular:     Rate and Rhythm: Normal rate and regular rhythm.     Heart sounds: Normal heart sounds. No murmur heard.   Pulmonary:     Effort: Pulmonary effort is normal.     Breath sounds: Normal breath sounds.  Chest:     Breasts:        Right: Normal.        Left: Normal.  Abdominal:     General: Bowel sounds are normal.     Palpations: Abdomen is soft.     Tenderness: There is no abdominal tenderness.  Musculoskeletal:        General: No swelling or tenderness. Normal range of motion.     Cervical back: Normal range of motion and neck supple.     Right lower leg: No edema.     Left lower leg: No edema.  Skin:    General: Skin is warm and dry.  Neurological:     Mental Status: She is alert and oriented to person, place, and time.     Comments: Uses rolling walker to ambulate.  Psychiatric:        Behavior: Behavior normal.        Thought Content: Thought content normal.        Judgment: Judgment normal.     BP (!) 146/67   Pulse 70   Ht 5\' 9"  (1.753 m)   Wt 263 lb 1.6 oz (119.3 kg)   BMI 38.85 kg/m  Wt Readings from Last 3 Encounters:  01/16/20 263 lb 1.6 oz (119.3 kg)  11/30/19 262 lb (118.8 kg)  11/18/19 250 lb (113.4 kg)     Health Maintenance Due  Topic Date Due  . FOOT EXAM  Never done  . OPHTHALMOLOGY EXAM  Never done  .  DEXA SCAN  Never done  . PNA vac Low Risk Adult (2 of 2 - PCV13) 01/28/2016  . HEMOGLOBIN A1C  07/14/2016  . INFLUENZA VACCINE  12/25/2019    There are no preventive care reminders to display for this  patient.  No results found for: TSH Lab Results  Component Value Date   WBC 10.5 08/20/2019   HGB 13.3 08/20/2019   HCT 40.2 08/20/2019   MCV 91.0 08/20/2019   PLT 186 08/20/2019   Lab Results  Component Value Date   NA 135 08/20/2019   K 4.2 08/20/2019   CO2 25 08/20/2019   GLUCOSE 160 (H) 08/20/2019   BUN 12 08/20/2019   CREATININE 0.90 08/20/2019   BILITOT 0.8 03/16/2017   ALKPHOS 130 (H) 03/16/2017   AST 21 03/16/2017   ALT 14 03/16/2017   PROT 7.4 03/16/2017   ALBUMIN 4.1 03/16/2017   CALCIUM 8.8 (L) 08/20/2019   ANIONGAP 8 08/20/2019   No results found for: CHOL No results found for: HDL No results found for: LDLCALC No results found for: TRIG No results found for: CHOLHDL Lab Results  Component Value Date   HGBA1C 6.0 01/12/2016      Assessment & Plan:   Problem List Items Addressed This Visit      Cardiovascular and Mediastinum   Benign essential HTN    - Today, the patient's blood pressure is well managed on losartan and betablockers. - The patient will continue the current treatment regimen.  - I encouraged the patient to eat a low-sodium diet to help control blood pressure. - I encouraged the patient to live an active lifestyle and complete activities that increases heart rate to 85% target heart rate at least 5 times per week for one hour.            Digestive   Gastro-esophageal reflux disease without esophagitis    - The patient's GERD is stable on medication.  - Instructed the patient to avoid eating spicy and acidic foods, as well as foods high in fat. - Instructed the patient to avoid eating large meals or meals 2-3 hours prior to sleeping.       Relevant Medications   pantoprazole (PROTONIX) 40 MG tablet     Endocrine   Type 2 diabetes mellitus without complication ([place])    - The patient's blood sugar is under control on sulfanurea and  [place]. - The patient will continue the current treatment regimen.  - I encouraged the  patient to regularly check blood sugar.  - I encouraged the patient to monitor diet. I encouraged the patient to eat low-carb and low-sugar to help prevent blood sugar spikes.  - I encouraged the patient to continue following their prescribed treatment plan for diabetes - I informed the patient to get help if blood sugar drops below 54mg /dL, or if suddenly have trouble thinking clearly or breathing.            Other   Dyslipidemia    Diet explained / cont  statin      Class 3 severe obesity due to excess calories in adult [facility]) - Primary    - I encouraged the patient to lose weight.  - I educated them on making healthy dietary choices including eating more fruits and vegetables and less fried foods. - I encouraged the patient to exercise more, and educated on the benefits of exercise including weight loss, diabetes management, and hypertension management.  Meds ordered this encounter  Medications  . pantoprazole (PROTONIX) 40 MG tablet    Sig: Take 1 tablet (40 mg total) by mouth daily.    Dispense:  30 tablet    Refill:  6    Follow-up: Return in about 2 months (around 03/17/2020).    By signing my name below, I, De Burrs, attest that this documentation has been prepared under the direction and in the presence of Cletis Athens, MD. Electronically Signed: De Burrs, Medical Scribe. 01/16/20. 3:30 PM. I personally performed the services described in this documentation, which was SCRIBED in my presence. The recorded information has been reviewed and considered accurate. It has been edited as necessary during review. Cletis Athens, MD

## 2020-01-16 NOTE — Assessment & Plan Note (Signed)
-   Today, the patient's blood pressure is well managed on losartan and betablockers. - The patient will continue the current treatment regimen.  - I encouraged the patient to eat a low-sodium diet to help control blood pressure. - I encouraged the patient to live an active lifestyle and complete activities that increases heart rate to 85% target heart rate at least 5 times per week for one hour.

## 2020-01-24 DIAGNOSIS — H18423 Band keratopathy, bilateral: Secondary | ICD-10-CM | POA: Diagnosis not present

## 2020-01-24 DIAGNOSIS — H2513 Age-related nuclear cataract, bilateral: Secondary | ICD-10-CM | POA: Diagnosis not present

## 2020-01-24 DIAGNOSIS — E119 Type 2 diabetes mellitus without complications: Secondary | ICD-10-CM | POA: Diagnosis not present

## 2020-01-24 DIAGNOSIS — H353132 Nonexudative age-related macular degeneration, bilateral, intermediate dry stage: Secondary | ICD-10-CM | POA: Diagnosis not present

## 2020-01-24 DIAGNOSIS — H25043 Posterior subcapsular polar age-related cataract, bilateral: Secondary | ICD-10-CM | POA: Diagnosis not present

## 2020-01-24 DIAGNOSIS — H5213 Myopia, bilateral: Secondary | ICD-10-CM | POA: Diagnosis not present

## 2020-02-14 ENCOUNTER — Other Ambulatory Visit: Payer: Self-pay | Admitting: Internal Medicine

## 2020-02-22 DIAGNOSIS — H18423 Band keratopathy, bilateral: Secondary | ICD-10-CM | POA: Diagnosis not present

## 2020-02-22 DIAGNOSIS — H2511 Age-related nuclear cataract, right eye: Secondary | ICD-10-CM | POA: Diagnosis not present

## 2020-02-22 DIAGNOSIS — H2513 Age-related nuclear cataract, bilateral: Secondary | ICD-10-CM | POA: Diagnosis not present

## 2020-02-22 DIAGNOSIS — E119 Type 2 diabetes mellitus without complications: Secondary | ICD-10-CM | POA: Diagnosis not present

## 2020-02-22 DIAGNOSIS — H35343 Macular cyst, hole, or pseudohole, bilateral: Secondary | ICD-10-CM | POA: Diagnosis not present

## 2020-02-24 HISTORY — PX: CATARACT EXTRACTION: SUR2

## 2020-02-24 HISTORY — PX: EYE SURGERY: SHX253

## 2020-02-28 NOTE — Progress Notes (Signed)
[facility] Note  03/01/2020     CHIEF COMPLAINT Patient presents for Retina Evaluation   HISTORY OF PRESENT ILLNESS: Victoria Dalton is a 86 y.o. female who presents to the clinic today for:   HPI    Retina Evaluation    In both eyes.  This started 1 month ago.  Duration of 1 month.  Associated Symptoms Glare and Distortion.  Negative for Flashes, Fever, Floaters and Pain.  I, the attending physician,  performed the HPI with the patient and updated documentation appropriately.          Comments    Patient here for retina Evaluation. Referred by Dr Lucita Ferrara. Patient states vision pretty good. When driving couldn't tell how fast going. Can see tv but cant see the captions. Uses magnifying glass to read.        Last edited by Bernarda Caffey, MD on 03/01/2020  3:12 PM. (History)    pt is here on the referral of Dr. Lucita Ferrara for cataract clearance, the surgeries are scheduled for 03/13/20 OD and 03/20/20 OS, pt states when she drives she cannot read the speedometer, she states she cannot read any small print, she states her driver's license expires tomorrow and she was told if she has cataract sx she can probably keep her license  Referring physician:  Vevelyn Royals, MD [address] [place], [place] [address]  HISTORICAL INFORMATION:   Selected notes from the [facility] Referred by Dr. Lucita Ferrara LEE: 02/22/2020 BCVA: OD 20/70, OS 20/70 Ocular Hx- Mac cyst OU, Cataracts OU, Band Keratopathy OU, ARMD OU PMH- DM    CURRENT MEDICATIONS: No current outpatient medications on file. (Ophthalmic Drugs)   No current facility-administered medications for this visit. (Ophthalmic Drugs)   Current Outpatient Medications (Other)  Medication Sig  . acetaminophen (TYLENOL) 500 MG tablet Take 500 mg by mouth every 6 (six) hours as needed for mild pain.  Marland Kitchen aspirin EC 81 MG tablet Take 81 mg by mouth daily.  . calcium carbonate (TUMS -  DOSED IN MG ELEMENTAL CALCIUM) 500 MG chewable tablet Chew 2 tablets by mouth daily as needed for indigestion or heartburn.  . conjugated estrogens (PREMARIN) vaginal cream Place 1 Applicatorful vaginally 2 (two) times a week. 1 gram vaginally at bedtime twice weekly  . glimepiride (AMARYL) 2 MG tablet TAKE 1 TABLET BY MOUTH DAILY  . glucose blood test strip Use as instructed  . losartan (COZAAR) 100 MG tablet TAKE 1 TABLET BY MOUTH DAILY  . metoprolol succinate (TOPROL-XL) 100 MG 24 hr tablet TAKE 1 TABLET BY MOUTH DAILY  . metoprolol succinate (TOPROL-XL) 100 MG 24 hr tablet TAKE 1 TABLET BY MOUTH DAILY  . Multiple Vitamins-Minerals (PRESERVISION AREDS PO) Take 1 capsule by mouth 2 (two) times daily.   Marland Kitchen nystatin-triamcinolone ointment (MYCOLOG) Apply 1 application topically 2 (two) times daily.  . ondansetron (ZOFRAN) 4 MG tablet Take 1 tablet (4 mg total) by mouth every 8 (eight) hours as needed for nausea or vomiting.  . ONE TOUCH ULTRA TEST test strip   . pantoprazole (PROTONIX) 40 MG tablet Take 1 tablet (40 mg total) by mouth daily.  . phenazopyridine (PYRIDIUM) 95 MG tablet Take 1 tablet (95 mg total) by mouth 3 (three) times daily as needed for pain.  . promethazine (PHENERGAN) 12.5 MG tablet Take by mouth.  . simvastatin (ZOCOR) 40 MG tablet Take 40 mg by mouth at bedtime.  . sitaGLIPtin (JANUVIA) 50 MG tablet  Take 25 mg by mouth daily.    No current facility-administered medications for this visit. (Other)      REVIEW OF SYSTEMS: ROS    Positive for: Endocrine, Cardiovascular, Eyes   Last edited by Theodore Demark, COA on 03/01/2020  8:49 AM. (History)       ALLERGIES Allergies  Allergen Reactions  . Chlorhexidine Itching  . [place] Extract]   . Ramipril Other (See Comments)    IRREGULAR HEART BEAT  . Penicillins Rash    Has patient had a PCN reaction causing immediate rash, facial/tongue/throat swelling, SOB or lightheadedness with  hypotension: Yes Has patient had a PCN reaction causing severe rash involving mucus membranes or skin necrosis: Yes Has patient had a PCN reaction that required hospitalization No Has patient had a PCN reaction occurring within the last 10 years: No If all of the above answers are "NO", then may proceed with Cephalosporin use.   . Sulfa Antibiotics Rash    PAST MEDICAL HISTORY Past Medical History:  Diagnosis Date  . Diabetes mellitus without complication ([place])   . Dyspnea   . Gallbladder calculus   . GERD (gastroesophageal reflux disease)   . Hyperlipidemia   . Hypertension   . Kidney stone   . Mass   . Parotid swelling   . UTI (lower urinary tract infection)    Past Surgical History:  Procedure Laterality Date  . CHOLECYSTECTOMY N/A 04/30/2016   Procedure: CHOLECYSTECTOMY;  Surgeon: Jules Husbands, MD;  Location: [facility] ORS;  Service: General;  Laterality: N/A;  . DIAGNOSTIC LAPAROSCOPIC LIVER BIOPSY  02/28/2016   Procedure: DIAGNOSTIC LAPAROSCOPIC LIVER BIOPSY;  Surgeon: Jules Husbands, MD;  Location: [facility] ORS;  Service: General;;  . ERCP N/A 01/13/2016   Procedure: ENDOSCOPIC RETROGRADE CHOLANGIOPANCREATOGRAPHY (ERCP);  Surgeon: Lucilla Lame, MD;  Location: [facility] ENDOSCOPY;  Service: Endoscopy;  Laterality: N/A;  . ERCP N/A 04/08/2016   Procedure: ENDOSCOPIC RETROGRADE CHOLANGIOPANCREATOGRAPHY (ERCP) Stent removal;  Surgeon: Lucilla Lame, MD;  Location: [facility] ENDOSCOPY;  Service: Endoscopy;  Laterality: N/A;  . INTRAOPERATIVE CHOLANGIOGRAM  04/30/2016   Procedure: INTRAOPERATIVE CHOLANGIOGRAM;  Surgeon: Jules Husbands, MD;  Location: [facility] ORS;  Service: General;;  . KNEE ARTHROSCOPY Left   . LAPAROSCOPY  02/28/2016   Procedure: Diagnotic laparoscopy with omental biopsy;  Surgeon: Jules Husbands, MD;  Location: [facility] ORS;  Service: General;;  . TONSILLECTOMY      FAMILY HISTORY Family History  Problem Relation Age of Onset  . Cancer Father   . Heart attack Father   . Cancer Brother    . Diabetes Brother   . Diabetes Son   . Breast cancer Neg Hx     SOCIAL HISTORY Social History   Tobacco Use  . Smoking status: Never Smoker  . Smokeless tobacco: Never Used  Substance Use Topics  . Alcohol use: No  . Drug use: No         OPHTHALMIC EXAM:  Base Eye Exam    Visual Acuity (Snellen - Linear)      Right Left   Dist cc 20/70 +2 20/100 -2   Dist ph cc 20/50 -2 20/60 -2   Correction: Glasses       Tonometry (Tonopen, 8:41 AM)      Right Left   Pressure 18 13       Pupils      Dark Light Shape React APD   Right 4 3 Round Brisk None   Left 4 3  Round Brisk None       Visual Fields (Counting fingers)      Left Right    Full Full       Extraocular Movement      Right Left    Full, Ortho Full, Ortho       Neuro/Psych    Oriented x3: Yes   Mood/Affect: Normal       Dilation    Both eyes: 1.0% Mydriacyl, 2.5% Phenylephrine @ 8:41 AM        Slit Lamp and Fundus Exam    Slit Lamp Exam      Right Left   Lids/Lashes Dermatochalasis - upper lid Dermatochalasis - upper lid   Conjunctiva/Sclera White and quiet White and quiet   Cornea early band K nasal and temporal, 2-3+ central horizontal PEE, arcus early band K nasal and temporal, 2-3+ central horizontal PEE, arcus   Anterior Chamber Deep and quiet Deep and quiet   Iris Round and dilated Round and dilated   Lens 3+ Nuclear sclerosis with brunescence, 2-3+ Cortical cataract, 1-2+ Posterior subcapsular cataract 3+ Nuclear sclerosis with brunescence, 2-3+ Cortical cataract, 1+ Posterior subcapsular cataract   Vitreous Vitreous syneresis Vitreous syneresis       Fundus Exam      Right Left   Disc Pink and Sharp, +Peripapillary atrophy Pink and Sharp, temporal Peripapillary atrophy   C/D Ratio 0.4 0.4   Macula Flat, Blunted foveal reflex, RPE mottling, clumping and atrophy centrally, No heme or edema Flat, Blunted foveal reflex, RPE mottling, clumping and atrophy centrally, No heme or edema    Vessels attenuated, mild tortuousity attenuated, mild tortuousity   Periphery Attached, reticular degeneration, No heme  Attached, reticular degeneration, No heme         Refraction    Wearing Rx      Sphere Cylinder Axis Add   Right -2.75 +1.00 174 +3.00   Left -2.75 +0.75 008 +3.25       Manifest Refraction      Sphere Cylinder Axis Dist VA   Right -2.75 +1.00 173 20/70   Left -3.00 +0.75 005 20/100-2          IMAGING AND PROCEDURES  Imaging and Procedures for 03/01/2020  OCT, Retina - OU - Both Eyes       Right Eye Quality was good. Central Foveal Thickness: 257. Progression has no prior data. Findings include normal foveal contour, no IRF, subretinal fluid, subretinal hyper-reflective material (Mild, focal central SRF).   Left Eye Quality was good. Central Foveal Thickness: 256. Progression has no prior data. Findings include normal foveal contour, subretinal hyper-reflective material, subretinal fluid (Mild, focal central SRF).   Notes *Images captured and stored on drive  Diagnosis / Impression:  NFP, no IRF, +SRF OU Mild, focal central SRF OU  Clinical management:  See below  Abbreviations: NFP - Normal foveal profile. CME - cystoid macular edema. PED - pigment epithelial detachment. IRF - intraretinal fluid. SRF - subretinal fluid. EZ - ellipsoid zone. ERM - epiretinal membrane. ORA - outer retinal atrophy. ORT - outer retinal tubulation. SRHM - subretinal hyper-reflective material. IRHM - intraretinal hyper-reflective material                 ASSESSMENT/PLAN:    ICD-10-CM   1. Intermediate stage nonexudative age-related macular degeneration of both eyes  H35.3132   2. Retinal edema  H35.81 OCT, Retina - OU - Both Eyes  3. Diabetes mellitus type 2 without retinopathy ([place])  E11.9  4. Essential hypertension  I10   5. Hypertensive retinopathy of both eyes  H35.033   6. Combined forms of age-related cataract of both eyes  H25.813   7. Band  keratopathy of both eyes  H18.423     1,2. Age related macular degeneration, non-exudative, both eyes  - OCT shows central subfoveal cyst -- vitelliform like lesion OU -- no heme or fluid  - The incidence, anatomy, and pathology of dry AMD, risk of progression, and the AREDS and AREDS 2 study including smoking risks discussed with patient.  - Recommend amsler grid monitoring  - clear from a retina standpoint to proceed with cataract surgery when pt and surgeon are ready  - f/u week of November 22 -- DFE, OCT, FA  3. Diabetes mellitus, type 2 without retinopathy - The incidence, risk factors for progression, natural history and treatment options for diabetic retinopathy  were discussed with patient.   - The need for close monitoring of blood glucose, blood pressure, and serum lipids, avoiding cigarette or any type of tobacco, and the need for long term follow up was also discussed with patient. - f/u in 1 year, sooner prn  4,5. Hypertensive retinopathy OU - discussed importance of tight BP control - monitor  6. Mixed Cataract OU - The symptoms of cataract, surgical options, and treatments and risks were discussed with patient. - discussed diagnosis and progression - visually significant - under the expert management of Dr. Lucita Ferrara - clear from a retina standpoint to proceed with cataract surgery when pt and surgeon are ready  7. Band keratopathy OU  Ophthalmic Meds Ordered this visit:  No orders of the defined types were placed in this encounter.      Return for f/u week of Nov 22, non-exu ARMD , DFE, OCT.  There are no Patient Instructions on file for this visit.   Explained the diagnoses, plan, and follow up with the patient and they expressed understanding.  Patient expressed understanding of the importance of proper follow up care.   This document serves as a record of services personally performed by Gardiner Sleeper, MD, PhD. It was created on their behalf by Leonie Douglas, an ophthalmic technician. The creation of this record is the provider's dictation and/or activities during the visit.    Electronically signed by: Leonie Douglas COA, 03/01/20  3:16 PM   Gardiner Sleeper, M.D., Ph.D. Diseases & Surgery of the Retina and Vitreous [facility]  I have reviewed the above documentation for accuracy and completeness, and I agree with the above. Gardiner Sleeper, M.D., Ph.D. 03/01/20 3:16 PM   Abbreviations: M myopia (nearsighted); A astigmatism; H hyperopia (farsighted); P presbyopia; Mrx spectacle prescription;  CTL contact lenses; OD right eye; OS left eye; OU both eyes  XT exotropia; ET esotropia; PEK punctate epithelial keratitis; PEE punctate epithelial erosions; DES dry eye syndrome; MGD meibomian gland dysfunction; ATs artificial tears; PFAT's preservative free artificial tears; [place] nuclear sclerotic cataract; PSC posterior subcapsular cataract; ERM epi-retinal membrane; PVD posterior vitreous detachment; RD retinal detachment; DM diabetes mellitus; DR diabetic retinopathy; NPDR non-proliferative diabetic retinopathy; PDR proliferative diabetic retinopathy; CSME clinically significant macular edema; DME diabetic macular edema; dbh dot blot hemorrhages; CWS cotton wool spot; POAG primary open angle glaucoma; C/D cup-to-disc ratio; HVF humphrey visual field; GVF goldmann visual field; OCT optical coherence tomography; IOP intraocular pressure; BRVO Branch retinal vein occlusion; CRVO central retinal vein occlusion; CRAO central retinal artery occlusion; BRAO branch retinal artery occlusion;  RT retinal tear; SB scleral buckle; PPV pars plana vitrectomy; VH Vitreous hemorrhage; PRP panretinal laser photocoagulation; IVK intravitreal kenalog; VMT vitreomacular traction; MH Macular hole;  NVD neovascularization of the disc; NVE neovascularization elsewhere; AREDS age related eye disease study; ARMD age related macular degeneration; POAG primary  open angle glaucoma; EBMD epithelial/anterior basement membrane dystrophy; ACIOL anterior chamber intraocular lens; IOL intraocular lens; PCIOL posterior chamber intraocular lens; Phaco/IOL phacoemulsification with intraocular lens placement; [place] photorefractive keratectomy; LASIK laser assisted in situ keratomileusis; HTN hypertension; DM diabetes mellitus; COPD chronic obstructive pulmonary disease

## 2020-03-01 ENCOUNTER — Ambulatory Visit (INDEPENDENT_AMBULATORY_CARE_PROVIDER_SITE_OTHER): Payer: Medicare Other | Admitting: Ophthalmology

## 2020-03-01 ENCOUNTER — Other Ambulatory Visit: Payer: Self-pay

## 2020-03-01 ENCOUNTER — Encounter (INDEPENDENT_AMBULATORY_CARE_PROVIDER_SITE_OTHER): Payer: Self-pay | Admitting: Ophthalmology

## 2020-03-01 DIAGNOSIS — H3581 Retinal edema: Secondary | ICD-10-CM

## 2020-03-01 DIAGNOSIS — E119 Type 2 diabetes mellitus without complications: Secondary | ICD-10-CM | POA: Diagnosis not present

## 2020-03-01 DIAGNOSIS — I10 Essential (primary) hypertension: Secondary | ICD-10-CM

## 2020-03-01 DIAGNOSIS — H35033 Hypertensive retinopathy, bilateral: Secondary | ICD-10-CM | POA: Diagnosis not present

## 2020-03-01 DIAGNOSIS — H353132 Nonexudative age-related macular degeneration, bilateral, intermediate dry stage: Secondary | ICD-10-CM

## 2020-03-01 DIAGNOSIS — H25813 Combined forms of age-related cataract, bilateral: Secondary | ICD-10-CM

## 2020-03-01 DIAGNOSIS — H18423 Band keratopathy, bilateral: Secondary | ICD-10-CM

## 2020-03-13 DIAGNOSIS — H2511 Age-related nuclear cataract, right eye: Secondary | ICD-10-CM | POA: Diagnosis not present

## 2020-03-13 DIAGNOSIS — H2512 Age-related nuclear cataract, left eye: Secondary | ICD-10-CM | POA: Diagnosis not present

## 2020-03-20 ENCOUNTER — Ambulatory Visit: Payer: Medicare Other | Admitting: Internal Medicine

## 2020-03-20 DIAGNOSIS — H2512 Age-related nuclear cataract, left eye: Secondary | ICD-10-CM | POA: Diagnosis not present

## 2020-03-22 ENCOUNTER — Other Ambulatory Visit: Payer: Self-pay | Admitting: Internal Medicine

## 2020-03-27 DIAGNOSIS — H2512 Age-related nuclear cataract, left eye: Secondary | ICD-10-CM | POA: Diagnosis not present

## 2020-03-27 DIAGNOSIS — Z961 Presence of intraocular lens: Secondary | ICD-10-CM | POA: Diagnosis not present

## 2020-03-29 ENCOUNTER — Ambulatory Visit (INDEPENDENT_AMBULATORY_CARE_PROVIDER_SITE_OTHER): Payer: Medicare Other | Admitting: Family Medicine

## 2020-03-29 ENCOUNTER — Other Ambulatory Visit: Payer: Self-pay

## 2020-03-29 ENCOUNTER — Encounter: Payer: Self-pay | Admitting: Family Medicine

## 2020-03-29 VITALS — BP 132/78 | HR 74 | Ht 69.0 in | Wt 262.4 lb

## 2020-03-29 DIAGNOSIS — Z23 Encounter for immunization: Secondary | ICD-10-CM | POA: Diagnosis not present

## 2020-03-29 DIAGNOSIS — E43 Unspecified severe protein-calorie malnutrition: Secondary | ICD-10-CM | POA: Diagnosis not present

## 2020-03-29 DIAGNOSIS — E119 Type 2 diabetes mellitus without complications: Secondary | ICD-10-CM | POA: Diagnosis not present

## 2020-03-29 DIAGNOSIS — E785 Hyperlipidemia, unspecified: Secondary | ICD-10-CM | POA: Diagnosis not present

## 2020-03-29 DIAGNOSIS — I10 Essential (primary) hypertension: Secondary | ICD-10-CM | POA: Diagnosis not present

## 2020-03-29 NOTE — Progress Notes (Signed)
Established Patient Office Visit  SUBJECTIVE:  Subjective  Patient ID: Victoria Dalton, female    DOB: [birth year]  Age: 86 y.o. MRN: [id number]  CC:  Chief Complaint  Patient presents with  . Hypertension    HPI Victoria Dalton is a 86 y.o. female presenting today for htn re eval,    Past Medical History:  Diagnosis Date  . Diabetes mellitus without complication ([place])   . Dyspnea   . Gallbladder calculus   . GERD (gastroesophageal reflux disease)   . Hyperlipidemia   . Hypertension   . Kidney stone   . Mass   . Parotid swelling   . UTI (lower urinary tract infection)     Past Surgical History:  Procedure Laterality Date  . CHOLECYSTECTOMY N/A 04/30/2016   Procedure: CHOLECYSTECTOMY;  Surgeon: Jules Husbands, MD;  Location: [facility] ORS;  Service: General;  Laterality: N/A;  . DIAGNOSTIC LAPAROSCOPIC LIVER BIOPSY  02/28/2016   Procedure: DIAGNOSTIC LAPAROSCOPIC LIVER BIOPSY;  Surgeon: Jules Husbands, MD;  Location: [facility] ORS;  Service: General;;  . ERCP N/A 01/13/2016   Procedure: ENDOSCOPIC RETROGRADE CHOLANGIOPANCREATOGRAPHY (ERCP);  Surgeon: Lucilla Lame, MD;  Location: [facility] ENDOSCOPY;  Service: Endoscopy;  Laterality: N/A;  . ERCP N/A 04/08/2016   Procedure: ENDOSCOPIC RETROGRADE CHOLANGIOPANCREATOGRAPHY (ERCP) Stent removal;  Surgeon: Lucilla Lame, MD;  Location: [facility] ENDOSCOPY;  Service: Endoscopy;  Laterality: N/A;  . INTRAOPERATIVE CHOLANGIOGRAM  04/30/2016   Procedure: INTRAOPERATIVE CHOLANGIOGRAM;  Surgeon: Jules Husbands, MD;  Location: [facility] ORS;  Service: General;;  . KNEE ARTHROSCOPY Left   . LAPAROSCOPY  02/28/2016   Procedure: Diagnotic laparoscopy with omental biopsy;  Surgeon: Jules Husbands, MD;  Location: [facility] ORS;  Service: General;;  . TONSILLECTOMY      Family History  Problem Relation Age of Onset  . Cancer Father   . Heart attack Father   . Cancer Brother   . Diabetes Brother   . Diabetes Son   . Breast cancer Neg Hx     Social History    Socioeconomic History  . Marital status: Widowed    Spouse name: Not on file  . Number of children: Not on file  . Years of education: Not on file  . Highest education level: Not on file  Occupational History  . Not on file  Tobacco Use  . Smoking status: Never Smoker  . Smokeless tobacco: Never Used  Substance and Sexual Activity  . Alcohol use: No  . Drug use: No  . Sexual activity: Not on file  Other Topics Concern  . Not on file  Social History Narrative  . Not on file   Social Determinants of Health   Financial Resource Strain:   . Difficulty of Paying Living Expenses: Not on file  Food Insecurity:   . Worried About Charity fundraiser in the Last Year: Not on file  . Ran Out of Food in the Last Year: Not on file  Transportation Needs:   . Lack of Transportation (Medical): Not on file  . Lack of Transportation (Non-Medical): Not on file  Physical Activity:   . Days of Exercise per Week: Not on file  . Minutes of Exercise per Session: Not on file  Stress:   . Feeling of Stress : Not on file  Social Connections:   . Frequency of Communication with Friends and Family: Not on file  . Frequency of Social Gatherings with Friends and Family: Not on file  . Attends Religious Services:  Not on file  . Active Member of Clubs or Organizations: Not on file  . Attends Archivist Meetings: Not on file  . Marital Status: Not on file  Intimate Partner Violence:   . Fear of Current or Ex-Partner: Not on file  . Emotionally Abused: Not on file  . Physically Abused: Not on file  . Sexually Abused: Not on file     Current Outpatient Medications:  .  acetaminophen (TYLENOL) 500 MG tablet, Take 500 mg by mouth every 6 (six) hours as needed for mild pain., Disp: , Rfl:  .  aspirin EC 81 MG tablet, Take 81 mg by mouth daily., Disp: , Rfl:  .  calcium carbonate (TUMS - DOSED IN MG ELEMENTAL CALCIUM) 500 MG chewable tablet, Chew 2 tablets by mouth daily as needed for  indigestion or heartburn., Disp: , Rfl:  .  conjugated estrogens (PREMARIN) vaginal cream, Place 1 Applicatorful vaginally 2 (two) times a week. 1 gram vaginally at bedtime twice weekly, Disp: 30 g, Rfl: 3 .  glimepiride (AMARYL) 2 MG tablet, TAKE 1 TABLET BY MOUTH DAILY, Disp: 30 tablet, Rfl: 6 .  glucose blood test strip, Use as instructed, Disp: 100 each, Rfl: 6 .  losartan (COZAAR) 100 MG tablet, TAKE 1 TABLET BY MOUTH DAILY, Disp: 30 tablet, Rfl: 6 .  metoprolol succinate (TOPROL-XL) 100 MG 24 hr tablet, TAKE 1 TABLET BY MOUTH DAILY, Disp: 30 tablet, Rfl: 3 .  metoprolol succinate (TOPROL-XL) 100 MG 24 hr tablet, TAKE 1 TABLET BY MOUTH DAILY, Disp: 30 tablet, Rfl: 3 .  Multiple Vitamins-Minerals (PRESERVISION AREDS PO), Take 1 capsule by mouth 2 (two) times daily. , Disp: , Rfl:  .  nystatin-triamcinolone ointment (MYCOLOG), Apply 1 application topically 2 (two) times daily., Disp: 30 g, Rfl: 0 .  ondansetron (ZOFRAN) 4 MG tablet, Take 1 tablet (4 mg total) by mouth every 8 (eight) hours as needed for nausea or vomiting., Disp: 20 tablet, Rfl: 0 .  ONE TOUCH ULTRA TEST test strip, , Disp: , Rfl: 0 .  pantoprazole (PROTONIX) 40 MG tablet, Take 1 tablet (40 mg total) by mouth daily., Disp: 30 tablet, Rfl: 6 .  phenazopyridine (PYRIDIUM) 95 MG tablet, Take 1 tablet (95 mg total) by mouth 3 (three) times daily as needed for pain., Disp: 90 tablet, Rfl: 0 .  promethazine (PHENERGAN) 12.5 MG tablet, Take by mouth., Disp: , Rfl:  .  simvastatin (ZOCOR) 40 MG tablet, Take 40 mg by mouth at bedtime., Disp: , Rfl: 0 .  sitaGLIPtin (JANUVIA) 50 MG tablet, Take 25 mg by mouth daily. , Disp: , Rfl:    Allergies  Allergen Reactions  . Chlorhexidine Itching  . [place] Extract]   . Ramipril Other (See Comments)    IRREGULAR HEART BEAT  . Penicillins Rash    Has patient had a PCN reaction causing immediate rash, facial/tongue/throat swelling, SOB or lightheadedness with  hypotension: Yes Has patient had a PCN reaction causing severe rash involving mucus membranes or skin necrosis: Yes Has patient had a PCN reaction that required hospitalization No Has patient had a PCN reaction occurring within the last 10 years: No If all of the above answers are "NO", then may proceed with Cephalosporin use.   . Sulfa Antibiotics Rash    ROS Review of Systems  HENT: Negative.   Respiratory: Negative.   Cardiovascular: Negative.   Gastrointestinal: Negative.   Allergic/Immunologic: Negative.   Psychiatric/Behavioral: Negative.   All other systems  reviewed and are negative.    OBJECTIVE:    Physical Exam HENT:     Head: Normocephalic and atraumatic.     Right Ear: Tympanic membrane and ear canal normal.     Left Ear: Tympanic membrane and ear canal normal.     Nose: Nose normal.     Mouth/Throat:     Mouth: Mucous membranes are moist.  Eyes:     Pupils: Pupils are equal, round, and reactive to light.  Cardiovascular:     Rate and Rhythm: Normal rate and regular rhythm.  Pulmonary:     Effort: Pulmonary effort is normal.  Abdominal:     General: Abdomen is flat.  Musculoskeletal:        General: Normal range of motion.  Skin:    General: Skin is warm.  Neurological:     General: No focal deficit present.  Psychiatric:        Mood and Affect: Mood normal.     BP 132/78   Pulse 74   Ht 5\' 9"  (1.753 m)   Wt 262 lb 6.4 oz (119 kg)   BMI 38.75 kg/m  Wt Readings from Last 3 Encounters:  03/29/20 262 lb 6.4 oz (119 kg)  01/16/20 263 lb 1.6 oz (119.3 kg)  11/30/19 262 lb (118.8 kg)    Health Maintenance Due  Topic Date Due  . FOOT EXAM  Never done  . OPHTHALMOLOGY EXAM  Never done  . DEXA SCAN  Never done  . PNA vac Low Risk Adult (2 of 2 - PCV13) 08/13/2018    There are no preventive care reminders to display for this patient.  CBC Latest Ref Rng & Units 04/05/2020 08/20/2019 03/16/2017  WBC 3.8 - 10.8 Thousand/uL 6.6 10.5 12.5(H)    Hemoglobin 11.7 - 15.5 g/dL 13.9 13.3 14.2  Hematocrit 35 - 45 % 41.5 40.2 43.1  Platelets 140 - 400 Thousand/uL 194 186 186   CMP Latest Ref Rng & Units 03/29/2020 08/20/2019 03/16/2017  Glucose 65 - 99 mg/dL 195(H) 160(H) 135(H)  BUN 7 - 25 mg/dL 21 12 20   Creatinine 0.60 - 0.88 mg/dL 0.97(H) 0.90 1.20(H)  Sodium 135 - 146 mmol/L 136 135 139  Potassium 3.5 - 5.3 mmol/L 4.5 4.2 4.7  Chloride 98 - 110 mmol/L 102 102 105  CO2 20 - 32 mmol/L 26 25 29   Calcium 8.6 - 10.4 mg/dL 9.5 8.8(L) 9.4  Total Protein 6.1 - 8.1 g/dL 6.7 - 7.4  Total Bilirubin 0.2 - 1.2 mg/dL 0.5 - 0.8  Alkaline Phos 38 - 126 U/L - - 130(H)  AST 10 - 35 U/L 16 - 21  ALT 6 - 29 U/L 15 - 14    Lab Results  Component Value Date   TSH 2.19 04/05/2020   Lab Results  Component Value Date   ALBUMIN 4.1 03/16/2017   ANIONGAP 8 08/20/2019   Lab Results  Component Value Date   CHOL 169 04/05/2020   HDL 53 04/05/2020   LDLCALC 97 04/05/2020   CHOLHDL 3.2 04/05/2020   Lab Results  Component Value Date   TRIG 99 04/05/2020   Lab Results  Component Value Date   HGBA1C 7.3 (H) 03/29/2020   HGBA1C 6.0 01/12/2016      ASSESSMENT & PLAN:   Problem List Items Addressed This Visit      Cardiovascular and Mediastinum   Benign essential HTN    BP wnl, taking all meds as rx.       Relevant  Orders   Comprehensive Metabolic Panel (CMET) (Completed)     Endocrine   Type 2 diabetes mellitus without complication (HCC)    Glucose between 120-140, taking all meds without s/e. Will draw A1C today      Relevant Orders   HgB A1c (Completed)   Comprehensive Metabolic Panel (CMET) (Completed)     Other   Dyslipidemia    Taking all meds as rx, will not be able to draw lipid panel next visit fasting.       Protein-calorie malnutrition, severe   Need for influenza vaccination - Primary   Relevant Orders   Flu Vaccine QUAD High Dose(Fluad) (Completed)      No orders of the defined types were placed in this  encounter.     Follow-up: No follow-ups on file.    Beckie Salts, [place] [address] [place], [place] [address]

## 2020-03-29 NOTE — Assessment & Plan Note (Signed)
Taking all meds as rx, will not be able to draw lipid panel next visit fasting.

## 2020-03-29 NOTE — Assessment & Plan Note (Signed)
BP wnl, taking all meds as rx.

## 2020-03-29 NOTE — Assessment & Plan Note (Signed)
Glucose between 120-140, taking all meds without s/e. Will draw A1C today

## 2020-03-30 LAB — COMPREHENSIVE METABOLIC PANEL
AG Ratio: 1.9 (calc) (ref 1.0–2.5)
ALT: 15 U/L (ref 6–29)
AST: 16 U/L (ref 10–35)
Albumin: 4.4 g/dL (ref 3.6–5.1)
Alkaline phosphatase (APISO): 117 U/L (ref 37–153)
BUN/Creatinine Ratio: 22 (calc) (ref 6–22)
BUN: 21 mg/dL (ref 7–25)
CO2: 26 mmol/L (ref 20–32)
Calcium: 9.5 mg/dL (ref 8.6–10.4)
Chloride: 102 mmol/L (ref 98–110)
Creat: 0.97 mg/dL — ABNORMAL HIGH (ref 0.60–0.88)
Globulin: 2.3 g/dL (calc) (ref 1.9–3.7)
Glucose, Bld: 195 mg/dL — ABNORMAL HIGH (ref 65–99)
Potassium: 4.5 mmol/L (ref 3.5–5.3)
Sodium: 136 mmol/L (ref 135–146)
Total Bilirubin: 0.5 mg/dL (ref 0.2–1.2)
Total Protein: 6.7 g/dL (ref 6.1–8.1)

## 2020-03-30 LAB — HEMOGLOBIN A1C
Hgb A1c MFr Bld: 7.3 % of total Hgb — ABNORMAL HIGH (ref <5.7)
Mean Plasma Glucose: 163 (calc)
eAG (mmol/L): 9 (calc)

## 2020-04-05 ENCOUNTER — Other Ambulatory Visit: Payer: Self-pay

## 2020-04-05 DIAGNOSIS — E785 Hyperlipidemia, unspecified: Secondary | ICD-10-CM | POA: Diagnosis not present

## 2020-04-05 DIAGNOSIS — K819 Cholecystitis, unspecified: Secondary | ICD-10-CM

## 2020-04-05 DIAGNOSIS — I10 Essential (primary) hypertension: Secondary | ICD-10-CM

## 2020-04-06 LAB — LIPID PANEL
Cholesterol: 169 mg/dL (ref <200)
HDL: 53 mg/dL (ref >=50)
LDL Cholesterol (Calc): 97 mg/dL (calc)
Non-HDL Cholesterol (Calc): 116 mg/dL (calc) (ref <130)
Total CHOL/HDL Ratio: 3.2 (calc) (ref <5.0)
Triglycerides: 99 mg/dL (ref <150)

## 2020-04-06 LAB — CBC WITH DIFFERENTIAL/PLATELET
Absolute Monocytes: 396 cells/uL (ref 200–950)
Basophils Absolute: 59 cells/uL (ref 0–200)
Basophils Relative: 0.9 %
Eosinophils Absolute: 119 cells/uL (ref 15–500)
Eosinophils Relative: 1.8 %
HCT: 41.5 % (ref 35.0–45.0)
Hemoglobin: 13.9 g/dL (ref 11.7–15.5)
Lymphs Abs: 1320 cells/uL (ref 850–3900)
MCH: 30.8 pg (ref 27.0–33.0)
MCHC: 33.5 g/dL (ref 32.0–36.0)
MCV: 91.8 fL (ref 80.0–100.0)
MPV: 11.6 fL (ref 7.5–12.5)
Monocytes Relative: 6 %
Neutro Abs: 4706 cells/uL (ref 1500–7800)
Neutrophils Relative %: 71.3 %
Platelets: 194 10*3/uL (ref 140–400)
RBC: 4.52 10*6/uL (ref 3.80–5.10)
RDW: 12.3 % (ref 11.0–15.0)
Total Lymphocyte: 20 %
WBC: 6.6 10*3/uL (ref 3.8–10.8)

## 2020-04-06 LAB — TSH: TSH: 2.19 mIU/L (ref 0.40–4.50)

## 2020-04-12 ENCOUNTER — Ambulatory Visit: Payer: Medicare Other | Admitting: Family Medicine

## 2020-04-12 NOTE — Progress Notes (Addendum)
[facility] Note  04/16/2020     CHIEF COMPLAINT Patient presents for Retina Follow Up   HISTORY OF PRESENT ILLNESS: Victoria Dalton is a 86 y.o. female who presents to the clinic today for:   HPI    Retina Follow Up    Patient presents with  Other.  I, the attending physician,  performed the HPI with the patient and updated documentation appropriately.          Comments    6 1/2 week follow up ARMD OU- Pt had cataract sx Oct 18th & 26th.  Vision is much better, pt is very happy with vision. Using Prednisolone qd OS. BS: 112 yesterday  A1C: 7.3       Last edited by Bernarda Caffey, MD on 04/16/2020 12:56 PM. (History)    pt has had cataract sx OU with Dr. Lucita Ferrara, pt is very happy with the results  Referring physician:  Vevelyn Royals, MD [address] [place], [place] [address]  HISTORICAL INFORMATION:   Selected notes from the [place] Referred by Dr. Lucita Ferrara LEE: 02/22/2020 BCVA: OD 20/70, OS 20/70 Ocular Hx- Mac cyst OU, Cataracts OU, Band Keratopathy OU, ARMD OU PMH- DM    CURRENT MEDICATIONS: No current outpatient medications on file. (Ophthalmic Drugs)   No current facility-administered medications for this visit. (Ophthalmic Drugs)   Current Outpatient Medications (Other)  Medication Sig  . acetaminophen (TYLENOL) 500 MG tablet Take 500 mg by mouth every 6 (six) hours as needed for mild pain.  Marland Kitchen aspirin EC 81 MG tablet Take 81 mg by mouth daily.  . calcium carbonate (TUMS - DOSED IN MG ELEMENTAL CALCIUM) 500 MG chewable tablet Chew 2 tablets by mouth daily as needed for indigestion or heartburn.  . conjugated estrogens (PREMARIN) vaginal cream Place 1 Applicatorful vaginally 2 (two) times a week. 1 gram vaginally at bedtime twice weekly  . glimepiride (AMARYL) 2 MG tablet TAKE 1 TABLET BY MOUTH DAILY  . glucose blood test strip Use as instructed  . losartan (COZAAR) 100 MG tablet TAKE 1 TABLET BY  MOUTH DAILY  . metoprolol succinate (TOPROL-XL) 100 MG 24 hr tablet TAKE 1 TABLET BY MOUTH DAILY  . metoprolol succinate (TOPROL-XL) 100 MG 24 hr tablet TAKE 1 TABLET BY MOUTH DAILY  . Multiple Vitamins-Minerals (PRESERVISION AREDS PO) Take 1 capsule by mouth 2 (two) times daily.   Marland Kitchen nystatin-triamcinolone ointment (MYCOLOG) Apply 1 application topically 2 (two) times daily.  . ondansetron (ZOFRAN) 4 MG tablet Take 1 tablet (4 mg total) by mouth every 8 (eight) hours as needed for nausea or vomiting.  . pantoprazole (PROTONIX) 40 MG tablet Take 1 tablet (40 mg total) by mouth daily.  . phenazopyridine (PYRIDIUM) 95 MG tablet Take 1 tablet (95 mg total) by mouth 3 (three) times daily as needed for pain.  . promethazine (PHENERGAN) 12.5 MG tablet Take by mouth.  . simvastatin (ZOCOR) 40 MG tablet Take 40 mg by mouth at bedtime.  . sitaGLIPtin (JANUVIA) 50 MG tablet Take 25 mg by mouth daily.   . ONE TOUCH ULTRA TEST test strip    No current facility-administered medications for this visit. (Other)      REVIEW OF SYSTEMS: ROS    Positive for: Gastrointestinal, Genitourinary, Endocrine, Cardiovascular, Eyes   Negative for: Constitutional, Neurological, Skin, Musculoskeletal, HENT, Respiratory, Psychiatric, Allergic/Imm, Heme/Lymph   Last edited by Leonie Douglas, COA on 04/16/2020  8:10 AM. (History)  ALLERGIES Allergies  Allergen Reactions  . Chlorhexidine Itching  . [place] Extract]   . Ramipril Other (See Comments)    IRREGULAR HEART BEAT  . Penicillins Rash    Has patient had a PCN reaction causing immediate rash, facial/tongue/throat swelling, SOB or lightheadedness with hypotension: Yes Has patient had a PCN reaction causing severe rash involving mucus membranes or skin necrosis: Yes Has patient had a PCN reaction that required hospitalization No Has patient had a PCN reaction occurring within the last 10 years: No If all of the above answers are  "NO", then may proceed with Cephalosporin use.   . Sulfa Antibiotics Rash    PAST MEDICAL HISTORY Past Medical History:  Diagnosis Date  . Diabetes mellitus without complication ([place])   . Dyspnea   . Gallbladder calculus   . GERD (gastroesophageal reflux disease)   . Hyperlipidemia   . Hypertension   . Kidney stone   . Mass   . Parotid swelling   . UTI (lower urinary tract infection)    Past Surgical History:  Procedure Laterality Date  . CHOLECYSTECTOMY N/A 04/30/2016   Procedure: CHOLECYSTECTOMY;  Surgeon: Jules Husbands, MD;  Location: [facility] ORS;  Service: General;  Laterality: N/A;  . DIAGNOSTIC LAPAROSCOPIC LIVER BIOPSY  02/28/2016   Procedure: DIAGNOSTIC LAPAROSCOPIC LIVER BIOPSY;  Surgeon: Jules Husbands, MD;  Location: [facility] ORS;  Service: General;;  . ERCP N/A 01/13/2016   Procedure: ENDOSCOPIC RETROGRADE CHOLANGIOPANCREATOGRAPHY (ERCP);  Surgeon: Lucilla Lame, MD;  Location: [facility] ENDOSCOPY;  Service: Endoscopy;  Laterality: N/A;  . ERCP N/A 04/08/2016   Procedure: ENDOSCOPIC RETROGRADE CHOLANGIOPANCREATOGRAPHY (ERCP) Stent removal;  Surgeon: Lucilla Lame, MD;  Location: [facility] ENDOSCOPY;  Service: Endoscopy;  Laterality: N/A;  . INTRAOPERATIVE CHOLANGIOGRAM  04/30/2016   Procedure: INTRAOPERATIVE CHOLANGIOGRAM;  Surgeon: Jules Husbands, MD;  Location: [facility] ORS;  Service: General;;  . KNEE ARTHROSCOPY Left   . LAPAROSCOPY  02/28/2016   Procedure: Diagnotic laparoscopy with omental biopsy;  Surgeon: Jules Husbands, MD;  Location: [facility] ORS;  Service: General;;  . TONSILLECTOMY      FAMILY HISTORY Family History  Problem Relation Age of Onset  . Cancer Father   . Heart attack Father   . Cancer Brother   . Diabetes Brother   . Diabetes Son   . Breast cancer Neg Hx     SOCIAL HISTORY Social History   Tobacco Use  . Smoking status: Never Smoker  . Smokeless tobacco: Never Used  Substance Use Topics  . Alcohol use: No  . Drug use: No         OPHTHALMIC EXAM:  Base  Eye Exam    Visual Acuity (Snellen - Linear)      Right Left   Dist [place] 20/50 20/70 +2   Dist ph [place] 20/50 +2 20/40       Tonometry (Tonopen, 8:17 AM)      Right Left   Pressure 10 10       Pupils      Dark Light Shape React APD   Right 4 3 Round Brisk None   Left 4 3 Round Brisk None       Visual Fields (Counting fingers)      Left Right    Full Full       Extraocular Movement      Right Left    Full Full       Neuro/Psych    Oriented x3: Yes   Mood/Affect:  Normal       Dilation    Both eyes: 1.0% Mydriacyl, 2.5% Phenylephrine @ 8:17 AM        Slit Lamp and Fundus Exam    Slit Lamp Exam      Right Left   Lids/Lashes Dermatochalasis - upper lid Dermatochalasis - upper lid   Conjunctiva/Sclera White and quiet White and quiet   Cornea early band K nasal and temporal, 1+PEE, arcus, well healed temporal cataract wounds early band K nasal and temporal, 2+PEE, arcus, well healed temporal cataract wounds   Anterior Chamber Deep and quiet Deep and quiet   Iris Round and dilated Round and dilated   Lens PC IOL in good position PC IOL in good position   Vitreous Vitreous syneresis Vitreous syneresis       Fundus Exam      Right Left   Disc Pink and Sharp, +Peripapillary atrophy Pink and Sharp, temporal Peripapillary atrophy   C/D Ratio 0.4 0.4   Macula Flat, Blunted foveal reflex, RPE mottling, clumping and atrophy centrally, No heme or edema Flat, Blunted foveal reflex, RPE mottling, clumping and atrophy centrally, No heme or edema   Vessels attenuated, mild tortuousity, mild AV crossing changes attenuated, mild tortuousity, mild AV crossing changes   Periphery Attached, reticular degeneration, No heme  Attached, reticular degeneration, No heme           IMAGING AND PROCEDURES  Imaging and Procedures for 04/16/2020  OCT, Retina - OU - Both Eyes       Right Eye Quality was good. Central Foveal Thickness: 252. Progression has been stable. Findings include  normal foveal contour, no IRF, subretinal fluid, subretinal hyper-reflective material (Mild, focal central SRF).   Left Eye Quality was good. Central Foveal Thickness: 254. Progression has been stable. Findings include normal foveal contour, subretinal hyper-reflective material, subretinal fluid (Mild, focal central SRF).   Notes *Images captured and stored on drive  Diagnosis / Impression:  NFP, no IRF, +SRF OU Mild, focal central SRF / sub-foveal cyst OU -- stable from prior  Clinical management:  See below  Abbreviations: NFP - Normal foveal profile. CME - cystoid macular edema. PED - pigment epithelial detachment. IRF - intraretinal fluid. SRF - subretinal fluid. EZ - ellipsoid zone. ERM - epiretinal membrane. ORA - outer retinal atrophy. ORT - outer retinal tubulation. SRHM - subretinal hyper-reflective material. IRHM - intraretinal hyper-reflective material        Fluorescein Angiography Optos (Transit OS)       Right Eye   Progression has no prior data. Early phase findings include staining. Mid/Late phase findings include staining (Mild central staining, no significant leakage).   Left Eye   Progression has no prior data. Early phase findings include delayed filling, staining. Mid/Late phase findings include staining (Mild central staining, no significant leakage).   Notes **Images stored on drive**  Impression: Mild central staining, no significant leakage OU No CNV OU                  ASSESSMENT/PLAN:    ICD-10-CM   1. Intermediate stage nonexudative age-related macular degeneration of both eyes  H35.3132   2. Retinal edema  H35.81 OCT, Retina - OU - Both Eyes  3. Diabetes mellitus type 2 without retinopathy ([place])  E11.9   4. Essential hypertension  I10   5. Hypertensive retinopathy of both eyes  H35.033 Fluorescein Angiography Optos (Transit OS)  6. Pseudophakia of both eyes  Z96.1   7. Band keratopathy of  both eyes  H18.423     1,2. Age  related macular degeneration, non-exudative, both eyes  - OCT shows central subfoveal cyst -- vitelliform like lesion OU -- no heme or fluid -- stable today  - FA 11.22.21 shows mild central staining; no leakage or CNV OU  - The incidence, anatomy, and pathology of dry AMD, risk of progression, and the AREDS and AREDS 2 study including smoking risks discussed with patient.  - Recommend amsler grid monitoring  - f/u 3 months, DFE, OCT  3. Diabetes mellitus, type 2 without retinopathy - The incidence, risk factors for progression, natural history and treatment options for diabetic retinopathy  were discussed with patient.   - The need for close monitoring of blood glucose, blood pressure, and serum lipids, avoiding cigarette or any type of tobacco, and the need for long term follow up was also discussed with patient. - f/u in 1 year, sooner prn  4,5. Hypertensive retinopathy OU - discussed importance of tight BP control - monitor  6. Pseudophakia OU  - s/p CE/IOL OU (Dr. Lucita Ferrara: 10.18.21; 10.26.21)  - IOLs in good position, doing well - monitor  7. Band keratopathy OU  Ophthalmic Meds Ordered this visit:  No orders of the defined types were placed in this encounter.      Return in about 3 months (around 07/17/2020) for nonex ARMD w/ subfoveal cyst OU - Dilated Exam, OCT.  There are no Patient Instructions on file for this visit.  This document serves as a record of services personally performed by Gardiner Sleeper, MD, PhD. It was created on their behalf by Leeann Must, [place], an ophthalmic technician. The creation of this record is the provider's dictation and/or activities during the visit.    Electronically signed by: Leeann Must, [place] 11.18.2021 1:00 PM   This document serves as a record of services personally performed by Gardiner Sleeper, MD, PhD. It was created on their behalf by San Jetty. Owens Shark, OA an ophthalmic technician. The creation of this record is the provider's  dictation and/or activities during the visit.    Electronically signed by: San Jetty. Owens Shark, [place] 11.22.2021 1:00 PM  Gardiner Sleeper, M.D., Ph.D. Diseases & Surgery of the Retina and [place] 04/16/2020   I have reviewed the above documentation for accuracy and completeness, and I agree with the above. Gardiner Sleeper, M.D., Ph.D. 04/16/20 1:00 PM   Abbreviations: M myopia (nearsighted); A astigmatism; H hyperopia (farsighted); P presbyopia; Mrx spectacle prescription;  CTL contact lenses; OD right eye; OS left eye; OU both eyes  XT exotropia; ET esotropia; PEK punctate epithelial keratitis; PEE punctate epithelial erosions; DES dry eye syndrome; MGD meibomian gland dysfunction; ATs artificial tears; PFAT's preservative free artificial tears; [place] nuclear sclerotic cataract; PSC posterior subcapsular cataract; ERM epi-retinal membrane; PVD posterior vitreous detachment; RD retinal detachment; DM diabetes mellitus; DR diabetic retinopathy; NPDR non-proliferative diabetic retinopathy; PDR proliferative diabetic retinopathy; CSME clinically significant macular edema; DME diabetic macular edema; dbh dot blot hemorrhages; CWS cotton wool spot; POAG primary open angle glaucoma; C/D cup-to-disc ratio; HVF humphrey visual field; GVF goldmann visual field; OCT optical coherence tomography; IOP intraocular pressure; BRVO Branch retinal vein occlusion; CRVO central retinal vein occlusion; CRAO central retinal artery occlusion; BRAO branch retinal artery occlusion; RT retinal tear; SB scleral buckle; PPV pars plana vitrectomy; VH Vitreous hemorrhage; PRP panretinal laser photocoagulation; IVK intravitreal kenalog; VMT vitreomacular traction; MH Macular hole;  NVD neovascularization of the disc; NVE  neovascularization elsewhere; AREDS age related eye disease study; ARMD age related macular degeneration; POAG primary open angle glaucoma; EBMD epithelial/anterior basement membrane  dystrophy; ACIOL anterior chamber intraocular lens; IOL intraocular lens; PCIOL posterior chamber intraocular lens; Phaco/IOL phacoemulsification with intraocular lens placement; [place] photorefractive keratectomy; LASIK laser assisted in situ keratomileusis; HTN hypertension; DM diabetes mellitus; COPD chronic obstructive pulmonary disease

## 2020-04-16 ENCOUNTER — Encounter (INDEPENDENT_AMBULATORY_CARE_PROVIDER_SITE_OTHER): Payer: Self-pay | Admitting: Ophthalmology

## 2020-04-16 ENCOUNTER — Ambulatory Visit (INDEPENDENT_AMBULATORY_CARE_PROVIDER_SITE_OTHER): Payer: Medicare Other | Admitting: Ophthalmology

## 2020-04-16 ENCOUNTER — Other Ambulatory Visit: Payer: Self-pay

## 2020-04-16 DIAGNOSIS — H3581 Retinal edema: Secondary | ICD-10-CM

## 2020-04-16 DIAGNOSIS — H18423 Band keratopathy, bilateral: Secondary | ICD-10-CM

## 2020-04-16 DIAGNOSIS — H25813 Combined forms of age-related cataract, bilateral: Secondary | ICD-10-CM

## 2020-04-16 DIAGNOSIS — H35033 Hypertensive retinopathy, bilateral: Secondary | ICD-10-CM

## 2020-04-16 DIAGNOSIS — I10 Essential (primary) hypertension: Secondary | ICD-10-CM | POA: Diagnosis not present

## 2020-04-16 DIAGNOSIS — H353132 Nonexudative age-related macular degeneration, bilateral, intermediate dry stage: Secondary | ICD-10-CM | POA: Diagnosis not present

## 2020-04-16 DIAGNOSIS — E119 Type 2 diabetes mellitus without complications: Secondary | ICD-10-CM | POA: Diagnosis not present

## 2020-04-16 DIAGNOSIS — Z961 Presence of intraocular lens: Secondary | ICD-10-CM

## 2020-05-31 ENCOUNTER — Encounter: Payer: Self-pay | Admitting: Family Medicine

## 2020-05-31 ENCOUNTER — Other Ambulatory Visit: Payer: Self-pay

## 2020-05-31 ENCOUNTER — Ambulatory Visit (INDEPENDENT_AMBULATORY_CARE_PROVIDER_SITE_OTHER): Payer: Medicare Other | Admitting: Family Medicine

## 2020-05-31 VITALS — BP 121/59 | HR 66 | Ht 69.0 in | Wt 264.6 lb

## 2020-05-31 DIAGNOSIS — B3789 Other sites of candidiasis: Secondary | ICD-10-CM | POA: Diagnosis not present

## 2020-05-31 MED ORDER — NYSTATIN-TRIAMCINOLONE 100000-0.1 UNIT/GM-% EX OINT: 1.0000 "application " | TOPICAL_OINTMENT | Freq: Two times a day (BID) | CUTANEOUS | 0 refills | Status: DC

## 2020-06-01 ENCOUNTER — Encounter: Payer: Self-pay | Admitting: Family Medicine

## 2020-06-01 NOTE — Assessment & Plan Note (Signed)
Rash under bilat breast x 7 days, she has tried powders, corn starch and steroid cream without improvement.  Plan- Will treat with antifungal, I has asked patient to keep the area dry and try and leave the area exposed to air at least 1 hour per day.

## 2020-06-01 NOTE — Progress Notes (Signed)
Established Patient Office Visit  SUBJECTIVE:  Subjective  Patient ID: Victoria Dalton, female    DOB: 02-Sep-1930  Age: 87 y.o. MRN: [id number]  CC:  Chief Complaint  Patient presents with  . Rash    Patient complains of rash under both breast. Rash is red, with blisters, Mr. Whirley states burning, itching and very painful, started last week.    HPI Victoria Dalton is a 87 y.o. female presenting today for     Past Medical History:  Diagnosis Date  . Diabetes mellitus without complication ([place])   . Dyspnea   . Gallbladder calculus   . GERD (gastroesophageal reflux disease)   . Hyperlipidemia   . Hypertension   . Kidney stone   . Mass   . Parotid swelling   . UTI (lower urinary tract infection)     Past Surgical History:  Procedure Laterality Date  . CHOLECYSTECTOMY N/A 04/30/2016   Procedure: CHOLECYSTECTOMY;  Surgeon: Jules Husbands, MD;  Location: [facility] ORS;  Service: General;  Laterality: N/A;  . DIAGNOSTIC LAPAROSCOPIC LIVER BIOPSY  02/28/2016   Procedure: DIAGNOSTIC LAPAROSCOPIC LIVER BIOPSY;  Surgeon: Jules Husbands, MD;  Location: [facility] ORS;  Service: General;;  . ERCP N/A 01/13/2016   Procedure: ENDOSCOPIC RETROGRADE CHOLANGIOPANCREATOGRAPHY (ERCP);  Surgeon: Lucilla Lame, MD;  Location: [facility] ENDOSCOPY;  Service: Endoscopy;  Laterality: N/A;  . ERCP N/A 04/08/2016   Procedure: ENDOSCOPIC RETROGRADE CHOLANGIOPANCREATOGRAPHY (ERCP) Stent removal;  Surgeon: Lucilla Lame, MD;  Location: [facility] ENDOSCOPY;  Service: Endoscopy;  Laterality: N/A;  . INTRAOPERATIVE CHOLANGIOGRAM  04/30/2016   Procedure: INTRAOPERATIVE CHOLANGIOGRAM;  Surgeon: Jules Husbands, MD;  Location: [facility] ORS;  Service: General;;  . KNEE ARTHROSCOPY Left   . LAPAROSCOPY  02/28/2016   Procedure: Diagnotic laparoscopy with omental biopsy;  Surgeon: Jules Husbands, MD;  Location: [facility] ORS;  Service: General;;  . TONSILLECTOMY      Family History  Problem Relation Age of Onset  . Cancer Father   . Heart  attack Father   . Cancer Brother   . Diabetes Brother   . Diabetes Son   . Breast cancer Neg Hx     Social History   Socioeconomic History  . Marital status: Widowed    Spouse name: Not on file  . Number of children: Not on file  . Years of education: Not on file  . Highest education level: Not on file  Occupational History  . Not on file  Tobacco Use  . Smoking status: Never Smoker  . Smokeless tobacco: Never Used  Substance and Sexual Activity  . Alcohol use: No  . Drug use: No  . Sexual activity: Not on file  Other Topics Concern  . Not on file  Social History Narrative  . Not on file   Social Determinants of Health   Financial Resource Strain: Not on file  Food Insecurity: Not on file  Transportation Needs: Not on file  Physical Activity: Not on file  Stress: Not on file  Social Connections: Not on file  Intimate Partner Violence: Not on file     Current Outpatient Medications:  .  nystatin-triamcinolone ointment (MYCOLOG), Apply 1 application topically 2 (two) times daily., Disp: 30 g, Rfl: 0 .  acetaminophen (TYLENOL) 500 MG tablet, Take 500 mg by mouth every 6 (six) hours as needed for mild pain., Disp: , Rfl:  .  aspirin EC 81 MG tablet, Take 81 mg by mouth daily., Disp: , Rfl:  .  calcium carbonate (  TUMS - DOSED IN MG ELEMENTAL CALCIUM) 500 MG chewable tablet, Chew 2 tablets by mouth daily as needed for indigestion or heartburn., Disp: , Rfl:  .  conjugated estrogens (PREMARIN) vaginal cream, Place 1 Applicatorful vaginally 2 (two) times a week. 1 gram vaginally at bedtime twice weekly, Disp: 30 g, Rfl: 3 .  glimepiride (AMARYL) 2 MG tablet, TAKE 1 TABLET BY MOUTH DAILY, Disp: 30 tablet, Rfl: 6 .  glucose blood test strip, Use as instructed, Disp: 100 each, Rfl: 6 .  losartan (COZAAR) 100 MG tablet, TAKE 1 TABLET BY MOUTH DAILY, Disp: 30 tablet, Rfl: 6 .  metoprolol succinate (TOPROL-XL) 100 MG 24 hr tablet, TAKE 1 TABLET BY MOUTH DAILY, Disp: 30 tablet,  Rfl: 3 .  metoprolol succinate (TOPROL-XL) 100 MG 24 hr tablet, TAKE 1 TABLET BY MOUTH DAILY, Disp: 30 tablet, Rfl: 3 .  Multiple Vitamins-Minerals (PRESERVISION AREDS PO), Take 1 capsule by mouth 2 (two) times daily. , Disp: , Rfl:  .  nystatin-triamcinolone ointment (MYCOLOG), Apply 1 application topically 2 (two) times daily., Disp: 30 g, Rfl: 0 .  ondansetron (ZOFRAN) 4 MG tablet, Take 1 tablet (4 mg total) by mouth every 8 (eight) hours as needed for nausea or vomiting., Disp: 20 tablet, Rfl: 0 .  ONE TOUCH ULTRA TEST test strip, , Disp: , Rfl: 0 .  pantoprazole (PROTONIX) 40 MG tablet, Take 1 tablet (40 mg total) by mouth daily., Disp: 30 tablet, Rfl: 6 .  phenazopyridine (PYRIDIUM) 95 MG tablet, Take 1 tablet (95 mg total) by mouth 3 (three) times daily as needed for pain., Disp: 90 tablet, Rfl: 0 .  promethazine (PHENERGAN) 12.5 MG tablet, Take by mouth., Disp: , Rfl:  .  simvastatin (ZOCOR) 40 MG tablet, Take 40 mg by mouth at bedtime., Disp: , Rfl: 0 .  sitaGLIPtin (JANUVIA) 50 MG tablet, Take 25 mg by mouth daily. , Disp: , Rfl:    Allergies  Allergen Reactions  . Chlorhexidine Itching  . [place] Extract]   . Ramipril Other (See Comments)    IRREGULAR HEART BEAT  . Penicillins Rash    Has patient had a PCN reaction causing immediate rash, facial/tongue/throat swelling, SOB or lightheadedness with hypotension: Yes Has patient had a PCN reaction causing severe rash involving mucus membranes or skin necrosis: Yes Has patient had a PCN reaction that required hospitalization No Has patient had a PCN reaction occurring within the last 10 years: No If all of the above answers are "NO", then may proceed with Cephalosporin use.   . Sulfa Antibiotics Rash    ROS Review of Systems  Constitutional: Negative.   HENT: Negative.   Respiratory: Negative.   Cardiovascular: Negative.   Skin: Positive for rash.  All other systems reviewed and are negative.     OBJECTIVE:    Physical Exam Vitals and nursing note reviewed.  HENT:     Head: Normocephalic.     Nose: Nose normal.     Mouth/Throat:     Mouth: Mucous membranes are moist.  Cardiovascular:     Rate and Rhythm: Normal rate.     Pulses: Normal pulses.  Pulmonary:     Effort: Pulmonary effort is normal.  Musculoskeletal:     Cervical back: Normal range of motion.  Skin:    Findings: Rash (Canddiasis under bilat breast.) present.  Neurological:     Mental Status: She is alert.  Psychiatric:        Mood and Affect: Mood  normal.     BP (!) 121/59   Pulse 66   Ht 5\' 9"  (1.753 m)   Wt 264 lb 9.6 oz (120 kg)   BMI 39.07 kg/m  Wt Readings from Last 3 Encounters:  05/31/20 264 lb 9.6 oz (120 kg)  03/29/20 262 lb 6.4 oz (119 kg)  01/16/20 263 lb 1.6 oz (119.3 kg)    Health Maintenance Due  Topic Date Due  . FOOT EXAM  Never done  . OPHTHALMOLOGY EXAM  Never done  . DEXA SCAN  Never done  . PNA vac Low Risk Adult (2 of 2 - PCV13) 08/13/2018  . COVID-19 Vaccine (3 - Booster for Moderna series) 02/08/2020    There are no preventive care reminders to display for this patient.  CBC Latest Ref Rng & Units 04/05/2020 08/20/2019 03/16/2017  WBC 3.8 - 10.8 Thousand/uL 6.6 10.5 12.5(H)  Hemoglobin 11.7 - 15.5 g/dL 13.9 13.3 14.2  Hematocrit 35.0 - 45.0 % 41.5 40.2 43.1  Platelets 140 - 400 Thousand/uL 194 186 186   CMP Latest Ref Rng & Units 03/29/2020 08/20/2019 03/16/2017  Glucose 65 - 99 mg/dL 195(H) 160(H) 135(H)  BUN 7 - 25 mg/dL 21 12 20   Creatinine 0.60 - 0.88 mg/dL 0.97(H) 0.90 1.20(H)  Sodium 135 - 146 mmol/L 136 135 139  Potassium 3.5 - 5.3 mmol/L 4.5 4.2 4.7  Chloride 98 - 110 mmol/L 102 102 105  CO2 20 - 32 mmol/L 26 25 29   Calcium 8.6 - 10.4 mg/dL 9.5 8.8(L) 9.4  Total Protein 6.1 - 8.1 g/dL 6.7 - 7.4  Total Bilirubin 0.2 - 1.2 mg/dL 0.5 - 0.8  Alkaline Phos 38 - 126 U/L - - 130(H)  AST 10 - 35 U/L 16 - 21  ALT 6 - 29 U/L 15 - 14    Lab Results   Component Value Date   TSH 2.19 04/05/2020   Lab Results  Component Value Date   ALBUMIN 4.1 03/16/2017   ANIONGAP 8 08/20/2019   Lab Results  Component Value Date   CHOL 169 04/05/2020   HDL 53 04/05/2020   LDLCALC 97 04/05/2020   CHOLHDL 3.2 04/05/2020   Lab Results  Component Value Date   TRIG 99 04/05/2020   Lab Results  Component Value Date   HGBA1C 7.3 (H) 03/29/2020   HGBA1C 6.0 01/12/2016      ASSESSMENT & PLAN:   Problem List Items Addressed This Visit      Other   Candidiasis of breast - Primary    Rash under bilat breast x 7 days, she has tried powders, corn starch and steroid cream without improvement.  Plan- Will treat with antifungal, I has asked patient to keep the area dry and try and leave the area exposed to air at least 1 hour per day.       Relevant Medications   nystatin-triamcinolone ointment (MYCOLOG)      Meds ordered this encounter  Medications  . nystatin-triamcinolone ointment (MYCOLOG)    Sig: Apply 1 application topically 2 (two) times daily.    Dispense:  30 g    Refill:  0      Follow-up: No follow-ups on file.    Beckie Salts, [place] [address] [place], [place] [address]

## 2020-06-07 ENCOUNTER — Ambulatory Visit (INDEPENDENT_AMBULATORY_CARE_PROVIDER_SITE_OTHER): Payer: Medicare Other | Admitting: Family Medicine

## 2020-06-07 ENCOUNTER — Other Ambulatory Visit: Payer: Self-pay

## 2020-06-07 VITALS — BP 114/66 | HR 60 | Wt 267.5 lb

## 2020-06-07 DIAGNOSIS — M25512 Pain in left shoulder: Secondary | ICD-10-CM

## 2020-06-07 DIAGNOSIS — B3789 Other sites of candidiasis: Secondary | ICD-10-CM

## 2020-06-07 MED ORDER — TRIAMCINOLONE ACETONIDE 40 MG/ML IJ SUSP
40.0000 mg | Freq: Once | INTRAMUSCULAR | Status: AC
  Administered 2020-06-07: 40 mg via INTRAMUSCULAR

## 2020-06-07 MED ORDER — LIDOCAINE HCL (PF) 1 % IJ SOLN
1.0000 mL | Freq: Once | INTRAMUSCULAR | Status: AC
  Administered 2020-06-07: 1 mL via INTRADERMAL

## 2020-06-07 NOTE — Progress Notes (Addendum)
Established Patient Office Visit  SUBJECTIVE:  Subjective  Patient ID: Victoria Dalton, female    DOB: 19-Jun-1930  Age: 87 y.o. MRN: [id number]  CC:  Chief Complaint  Patient presents with  . rash    Rash under breasts. Much better but still there    HPI Victoria Dalton is a 87 y.o. female presenting today for     Past Medical History:  Diagnosis Date  . Diabetes mellitus without complication ([place])   . Dyspnea   . Gallbladder calculus   . GERD (gastroesophageal reflux disease)   . Hyperlipidemia   . Hypertension   . Kidney stone   . Mass   . Parotid swelling   . UTI (lower urinary tract infection)     Past Surgical History:  Procedure Laterality Date  . CHOLECYSTECTOMY N/A 04/30/2016   Procedure: CHOLECYSTECTOMY;  Surgeon: Jules Husbands, MD;  Location: [facility] ORS;  Service: General;  Laterality: N/A;  . DIAGNOSTIC LAPAROSCOPIC LIVER BIOPSY  02/28/2016   Procedure: DIAGNOSTIC LAPAROSCOPIC LIVER BIOPSY;  Surgeon: Jules Husbands, MD;  Location: [facility] ORS;  Service: General;;  . ERCP N/A 01/13/2016   Procedure: ENDOSCOPIC RETROGRADE CHOLANGIOPANCREATOGRAPHY (ERCP);  Surgeon: Lucilla Lame, MD;  Location: [facility] ENDOSCOPY;  Service: Endoscopy;  Laterality: N/A;  . ERCP N/A 04/08/2016   Procedure: ENDOSCOPIC RETROGRADE CHOLANGIOPANCREATOGRAPHY (ERCP) Stent removal;  Surgeon: Lucilla Lame, MD;  Location: [facility] ENDOSCOPY;  Service: Endoscopy;  Laterality: N/A;  . INTRAOPERATIVE CHOLANGIOGRAM  04/30/2016   Procedure: INTRAOPERATIVE CHOLANGIOGRAM;  Surgeon: Jules Husbands, MD;  Location: [facility] ORS;  Service: General;;  . KNEE ARTHROSCOPY Left   . LAPAROSCOPY  02/28/2016   Procedure: Diagnotic laparoscopy with omental biopsy;  Surgeon: Jules Husbands, MD;  Location: [facility] ORS;  Service: General;;  . TONSILLECTOMY      Family History  Problem Relation Age of Onset  . Cancer Father   . Heart attack Father   . Cancer Brother   . Diabetes Brother   . Diabetes Son   . Breast cancer Neg Hx      Social History   Socioeconomic History  . Marital status: Widowed    Spouse name: Not on file  . Number of children: Not on file  . Years of education: Not on file  . Highest education level: Not on file  Occupational History  . Not on file  Tobacco Use  . Smoking status: Never Smoker  . Smokeless tobacco: Never Used  Substance and Sexual Activity  . Alcohol use: No  . Drug use: No  . Sexual activity: Not on file  Other Topics Concern  . Not on file  Social History Narrative  . Not on file   Social Determinants of Health   Financial Resource Strain: Not on file  Food Insecurity: Not on file  Transportation Needs: Not on file  Physical Activity: Not on file  Stress: Not on file  Social Connections: Not on file  Intimate Partner Violence: Not on file     Current Outpatient Medications:  .  acetaminophen (TYLENOL) 500 MG tablet, Take 500 mg by mouth every 6 (six) hours as needed for mild pain., Disp: , Rfl:  .  aspirin EC 81 MG tablet, Take 81 mg by mouth daily., Disp: , Rfl:  .  calcium carbonate (TUMS - DOSED IN MG ELEMENTAL CALCIUM) 500 MG chewable tablet, Chew 2 tablets by mouth daily as needed for indigestion or heartburn., Disp: , Rfl:  .  conjugated estrogens (PREMARIN) vaginal  cream, Place 1 Applicatorful vaginally 2 (two) times a week. 1 gram vaginally at bedtime twice weekly, Disp: 30 g, Rfl: 3 .  glimepiride (AMARYL) 2 MG tablet, TAKE 1 TABLET BY MOUTH DAILY, Disp: 30 tablet, Rfl: 6 .  glucose blood test strip, Use as instructed, Disp: 100 each, Rfl: 6 .  losartan (COZAAR) 100 MG tablet, TAKE 1 TABLET BY MOUTH DAILY, Disp: 30 tablet, Rfl: 6 .  metoprolol succinate (TOPROL-XL) 100 MG 24 hr tablet, TAKE 1 TABLET BY MOUTH DAILY, Disp: 30 tablet, Rfl: 3 .  metoprolol succinate (TOPROL-XL) 100 MG 24 hr tablet, TAKE 1 TABLET BY MOUTH DAILY, Disp: 30 tablet, Rfl: 3 .  Multiple Vitamins-Minerals (PRESERVISION AREDS PO), Take 1 capsule by mouth 2 (two) times daily. ,  Disp: , Rfl:  .  ondansetron (ZOFRAN) 4 MG tablet, Take 1 tablet (4 mg total) by mouth every 8 (eight) hours as needed for nausea or vomiting., Disp: 20 tablet, Rfl: 0 .  ONE TOUCH ULTRA TEST test strip, , Disp: , Rfl: 0 .  pantoprazole (PROTONIX) 40 MG tablet, Take 1 tablet (40 mg total) by mouth daily., Disp: 30 tablet, Rfl: 6 .  phenazopyridine (PYRIDIUM) 95 MG tablet, Take 1 tablet (95 mg total) by mouth 3 (three) times daily as needed for pain., Disp: 90 tablet, Rfl: 0 .  promethazine (PHENERGAN) 12.5 MG tablet, Take by mouth., Disp: , Rfl:  .  simvastatin (ZOCOR) 40 MG tablet, Take 40 mg by mouth at bedtime., Disp: , Rfl: 0 .  sitaGLIPtin (JANUVIA) 50 MG tablet, Take 25 mg by mouth daily. , Disp: , Rfl:  .  nystatin-triamcinolone ointment (MYCOLOG), Apply 1 application topically 2 (two) times daily. (Patient not taking: Reported on 06/07/2020), Disp: 30 g, Rfl: 0   Allergies  Allergen Reactions  . Chlorhexidine Itching  . [place] Extract]   . Ramipril Other (See Comments)    IRREGULAR HEART BEAT  . Penicillins Rash    Has patient had a PCN reaction causing immediate rash, facial/tongue/throat swelling, SOB or lightheadedness with hypotension: Yes Has patient had a PCN reaction causing severe rash involving mucus membranes or skin necrosis: Yes Has patient had a PCN reaction that required hospitalization No Has patient had a PCN reaction occurring within the last 10 years: No If all of the above answers are "NO", then may proceed with Cephalosporin use.   . Sulfa Antibiotics Rash    ROS Review of Systems  Constitutional: Negative.   HENT: Negative.   Respiratory: Negative.   Musculoskeletal: Negative.   Skin: Positive for rash.     OBJECTIVE:    Physical Exam Vitals reviewed.  HENT:     Head: Normocephalic.  Musculoskeletal:        General: Normal range of motion.  Skin:    Findings: Rash present.     Comments: Improved fungal infection, smaller  on both sides, no signs of secondary infection.   Neurological:     Mental Status: She is alert.  Psychiatric:        Mood and Affect: Mood normal.     BP 114/66   Pulse 60   Wt 267 lb 8 oz (121.3 kg)   BMI 39.50 kg/m  Wt Readings from Last 3 Encounters:  06/07/20 267 lb 8 oz (121.3 kg)  05/31/20 264 lb 9.6 oz (120 kg)  03/29/20 262 lb 6.4 oz (119 kg)    Health Maintenance Due  Topic Date Due  . FOOT EXAM  Never done  . OPHTHALMOLOGY EXAM  Never done  . DEXA SCAN  Never done  . PNA vac Low Risk Adult (2 of 2 - PCV13) 08/13/2018  . COVID-19 Vaccine (3 - Booster for Moderna series) 02/08/2020    There are no preventive care reminders to display for this patient.  CBC Latest Ref Rng & Units 04/05/2020 08/20/2019 03/16/2017  WBC 3.8 - 10.8 Thousand/uL 6.6 10.5 12.5(H)  Hemoglobin 11.7 - 15.5 g/dL 13.9 13.3 14.2  Hematocrit 35.0 - 45.0 % 41.5 40.2 43.1  Platelets 140 - 400 Thousand/uL 194 186 186   CMP Latest Ref Rng & Units 03/29/2020 08/20/2019 03/16/2017  Glucose 65 - 99 mg/dL 195(H) 160(H) 135(H)  BUN 7 - 25 mg/dL 21 12 20   Creatinine 0.60 - 0.88 mg/dL 0.97(H) 0.90 1.20(H)  Sodium 135 - 146 mmol/L 136 135 139  Potassium 3.5 - 5.3 mmol/L 4.5 4.2 4.7  Chloride 98 - 110 mmol/L 102 102 105  CO2 20 - 32 mmol/L 26 25 29   Calcium 8.6 - 10.4 mg/dL 9.5 8.8(L) 9.4  Total Protein 6.1 - 8.1 g/dL 6.7 - 7.4  Total Bilirubin 0.2 - 1.2 mg/dL 0.5 - 0.8  Alkaline Phos 38 - 126 U/L - - 130(H)  AST 10 - 35 U/L 16 - 21  ALT 6 - 29 U/L 15 - 14    Lab Results  Component Value Date   TSH 2.19 04/05/2020   Lab Results  Component Value Date   ALBUMIN 4.1 03/16/2017   ANIONGAP 8 08/20/2019   Lab Results  Component Value Date   CHOL 169 04/05/2020   HDL 53 04/05/2020   LDLCALC 97 04/05/2020   CHOLHDL 3.2 04/05/2020   Lab Results  Component Value Date   TRIG 99 04/05/2020   Lab Results  Component Value Date   HGBA1C 7.3 (H) 03/29/2020   HGBA1C 6.0 01/12/2016       ASSESSMENT & PLAN:   Problem List Items Addressed This Visit      Other   Candidiasis of breast    Pt returned today for fu on candidiasis under bilat breast, the rx cream was too expensive so she has been using OTC antifungal cream. The area has greatly improved in 1 week, also keeping the area dry. FU as needed.       Other Visit Diagnoses    Left shoulder pain, unspecified chronicity    -  Primary   Relevant Medications   lidocaine (PF) (XYLOCAINE) 1 % injection 1 mL (Completed)   triamcinolone acetonide (KENALOG-40) injection 40 mg (Completed)      Meds ordered this encounter  Medications  . lidocaine (PF) (XYLOCAINE) 1 % injection 1 mL  . triamcinolone acetonide (KENALOG-40) injection 40 mg      Follow-up: No follow-ups on file.    Beckie Salts, [place] [address], [place], [place] [address]

## 2020-06-07 NOTE — Assessment & Plan Note (Signed)
Pt returned today for fu on candidiasis under bilat breast, the rx cream was too expensive so she has been using OTC antifungal cream. The area has greatly improved in 1 week, also keeping the area dry. FU as needed.

## 2020-06-28 ENCOUNTER — Other Ambulatory Visit: Payer: Self-pay | Admitting: Internal Medicine

## 2020-07-09 NOTE — Progress Notes (Signed)
[facility] Note  07/17/2020     CHIEF COMPLAINT Patient presents for Retina Follow Up   HISTORY OF PRESENT ILLNESS: Victoria Dalton is a 87 y.o. female who presents to the clinic today for:   HPI    Retina Follow Up    Patient presents with  Dry AMD.  In both eyes.  This started months ago.  Severity is moderate.  Duration of 3 months.  Since onset it is stable.  I, the attending physician,  performed the HPI with the patient and updated documentation appropriately.          Comments    87 y/o female pt here for 3 mo f/u for non-exu ARMD w/subfoveal cyst OU.  No change in [place] OU.  Denies pain, FOL, floaters.  No gtts.  BS this a.m. 151.  A1C unknown.       Last edited by Bernarda Caffey, MD on 07/17/2020  8:13 AM. (History)    pt states no change in vision, she has gotten a new glasses rx  Referring physician:  Vevelyn Royals, MD [address] [place], [place] [address]  HISTORICAL INFORMATION:   Selected notes from the [facility] Referred by Dr. Lucita Ferrara LEE: 02/22/2020 BCVA: OD 20/70, OS 20/70 Ocular Hx- Mac cyst OU, Cataracts OU, Band Keratopathy OU, ARMD OU PMH- DM   CURRENT MEDICATIONS: No current outpatient medications on file. (Ophthalmic Drugs)   No current facility-administered medications for this visit. (Ophthalmic Drugs)   Current Outpatient Medications (Other)  Medication Sig  . acetaminophen (TYLENOL) 500 MG tablet Take 500 mg by mouth every 6 (six) hours as needed for mild pain.  Marland Kitchen aspirin EC 81 MG tablet Take 81 mg by mouth daily.  . calcium carbonate (OS-CAL) 1250 (500 Ca) MG chewable tablet Chew by mouth.  . calcium carbonate (TUMS - DOSED IN MG ELEMENTAL CALCIUM) 500 MG chewable tablet Chew 2 tablets by mouth daily as needed for indigestion or heartburn.  . clotrimazole-betamethasone (LOTRISONE) cream APP EXT AA BID  . conjugated estrogens (PREMARIN) vaginal cream Place 1 Applicatorful vaginally 2  (two) times a week. 1 gram vaginally at bedtime twice weekly  . glimepiride (AMARYL) 2 MG tablet TAKE 1 TABLET BY MOUTH DAILY  . glucose blood test strip Use as instructed  . HYDROcodone-acetaminophen (NORCO/VICODIN) 5-325 MG tablet Take one tablet at night for pain; may take up to every 6 hours as needed for pain if not working or driving  . losartan (COZAAR) 100 MG tablet TAKE 1 TABLET BY MOUTH DAILY  . losartan-hydrochlorothiazide (HYZAAR) 100-25 MG tablet Take 1 tablet by mouth daily.  . metFORMIN (GLUCOPHAGE) 500 MG tablet Take 1 tablet by mouth 3 (three) times daily.  . metoprolol succinate (TOPROL-XL) 100 MG 24 hr tablet TAKE 1 TABLET BY MOUTH DAILY  . metoprolol succinate (TOPROL-XL) 100 MG 24 hr tablet TAKE 1 TABLET BY MOUTH DAILY  . Multiple Vitamins-Minerals (PRESERVISION AREDS PO) Take 1 capsule by mouth 2 (two) times daily.   Marland Kitchen nystatin-triamcinolone ointment (MYCOLOG) Apply 1 application topically 2 (two) times daily.  Marland Kitchen omeprazole (PRILOSEC) 20 MG capsule Take by mouth.  . ondansetron (ZOFRAN) 4 MG tablet Take 1 tablet (4 mg total) by mouth every 8 (eight) hours as needed for nausea or vomiting.  . ONE TOUCH ULTRA TEST test strip   . pantoprazole (PROTONIX) 40 MG tablet Take 1 tablet (40 mg total) by mouth daily.  . phenazopyridine (PYRIDIUM) 95 MG  tablet Take 1 tablet (95 mg total) by mouth 3 (three) times daily as needed for pain.  . promethazine (PHENERGAN) 12.5 MG tablet Take by mouth.  . simvastatin (ZOCOR) 40 MG tablet Take 40 mg by mouth at bedtime.  . sitaGLIPtin (JANUVIA) 50 MG tablet Take 25 mg by mouth daily.    No current facility-administered medications for this visit. (Other)      REVIEW OF SYSTEMS: ROS    Positive for: Gastrointestinal, Endocrine, Eyes   Negative for: Constitutional, Neurological, Skin, Genitourinary, Musculoskeletal, HENT, Cardiovascular, Respiratory, Psychiatric, Allergic/Imm, Heme/Lymph   Last edited by Matthew Folks, COA on 07/17/2020   8:03 AM. (History)       ALLERGIES Allergies  Allergen Reactions  . Chlorhexidine Itching  . [place] Extract]   . Ramipril Other (See Comments)    IRREGULAR HEART BEAT  . Penicillins Rash    Has patient had a PCN reaction causing immediate rash, facial/tongue/throat swelling, SOB or lightheadedness with hypotension: Yes Has patient had a PCN reaction causing severe rash involving mucus membranes or skin necrosis: Yes Has patient had a PCN reaction that required hospitalization No Has patient had a PCN reaction occurring within the last 10 years: No If all of the above answers are "NO", then may proceed with Cephalosporin use.   . Sulfa Antibiotics Rash    PAST MEDICAL HISTORY Past Medical History:  Diagnosis Date  . Diabetes mellitus without complication ([place])   . Dyspnea   . Gallbladder calculus   . GERD (gastroesophageal reflux disease)   . Hyperlipidemia   . Hypertension   . Hypertensive retinopathy    OU  . Kidney stone   . Macular degeneration    Dry OU  . Mass   . Parotid swelling   . UTI (lower urinary tract infection)    Past Surgical History:  Procedure Laterality Date  . CATARACT EXTRACTION Bilateral 02/2020   Dr. Vevelyn Royals  . CHOLECYSTECTOMY N/A 04/30/2016   Procedure: CHOLECYSTECTOMY;  Surgeon: Jules Husbands, MD;  Location: [facility] ORS;  Service: General;  Laterality: N/A;  . DIAGNOSTIC LAPAROSCOPIC LIVER BIOPSY  02/28/2016   Procedure: DIAGNOSTIC LAPAROSCOPIC LIVER BIOPSY;  Surgeon: Jules Husbands, MD;  Location: [facility] ORS;  Service: General;;  . ERCP N/A 01/13/2016   Procedure: ENDOSCOPIC RETROGRADE CHOLANGIOPANCREATOGRAPHY (ERCP);  Surgeon: Lucilla Lame, MD;  Location[facility] ENDOSCOPY;  Service: Endoscopy;  Laterality: N/A;  . ERCP N/A 04/08/2016   Procedure: ENDOSCOPIC RETROGRADE CHOLANGIOPANCREATOGRAPHY (ERCP) Stent removal;  Surgeon: Lucilla Lame, MD;  Location: [facility] ENDOSCOPY;  Service: Endoscopy;  Laterality: N/A;  . EYE  SURGERY Bilateral 02/2020   Cat Sx - Dr. Vevelyn Royals  . INTRAOPERATIVE CHOLANGIOGRAM  04/30/2016   Procedure: INTRAOPERATIVE CHOLANGIOGRAM;  Surgeon: Jules Husbands, MD;  Location: [facility] ORS;  Service: General;;  . KNEE ARTHROSCOPY Left   . LAPAROSCOPY  02/28/2016   Procedure: Diagnotic laparoscopy with omental biopsy;  Surgeon: Jules Husbands, MD;  Location: [facility] ORS;  Service: General;;  . TONSILLECTOMY      FAMILY HISTORY Family History  Problem Relation Age of Onset  . Cancer Father   . Heart attack Father   . Cancer Brother   . Diabetes Brother   . Diabetes Son   . Breast cancer Neg Hx     SOCIAL HISTORY Social History   Tobacco Use  . Smoking status: Never Smoker  . Smokeless tobacco: Never Used  Substance Use Topics  . Alcohol use: No  .  Drug use: No         OPHTHALMIC EXAM:  Base Eye Exam    Visual Acuity (Snellen - Linear)      Right Left   Dist cc 20/50 20/50 -2   Dist ph cc NI 20/40 -2   Correction: Glasses       Tonometry (Tonopen, 8:06 AM)      Right Left   Pressure 15 14       Pupils      Dark Light Shape React APD   Right 4 3 Round Brisk None   Left 4 3 Round Brisk None       Visual Fields (Counting fingers)      Left Right    Full Full       Extraocular Movement      Right Left    Full, Ortho Full, Ortho       Neuro/Psych    Oriented x3: Yes   Mood/Affect: Normal       Dilation    Both eyes: 1.0% Mydriacyl, 2.5% Phenylephrine @ 8:06 AM        Slit Lamp and Fundus Exam    Slit Lamp Exam      Right Left   Lids/Lashes Dermatochalasis - upper lid Dermatochalasis - upper lid   Conjunctiva/Sclera White and quiet White and quiet   Cornea early band K nasal and temporal, 1-2+PEE centrally, arcus, well healed temporal cataract wounds early band K nasal and temporal, 2+PEE, arcus, well healed temporal cataract wounds   Anterior Chamber Deep and quiet Deep and quiet   Iris Round and dilated Round and dilated   Lens PC IOL in  good position, 1+ Posterior capsular opacification PC IOL in good position, Posterior capsular opacification   Vitreous Vitreous syneresis, Posterior vitreous detachment Vitreous syneresis, Posterior vitreous detachment       Fundus Exam      Right Left   Disc mild pallor, Sharp rim, +Peripapillary atrophy Pink and Sharp, temporal Peripapillary atrophy   C/D Ratio 0.5 0.4   Macula Flat, Blunted foveal reflex, RPE mottling, clumping and atrophy centrally, No heme or edema Flat, Blunted foveal reflex, RPE mottling, clumping and atrophy centrally, No heme or edema   Vessels attenuated, mild tortuousity attenuated, mild tortuousity   Periphery Attached, reticular degeneration, No heme  Attached, reticular degeneration, No heme           IMAGING AND PROCEDURES  Imaging and Procedures for 07/17/2020  OCT, Retina - OU - Both Eyes       Right Eye Quality was good. Central Foveal Thickness: 252. Progression has improved. Findings include normal foveal contour, no IRF, subretinal fluid, subretinal hyper-reflective material (Interval improvement in focal central SRF / SRHM).   Left Eye Quality was good. Central Foveal Thickness: 248. Progression has improved. Findings include normal foveal contour, subretinal hyper-reflective material, subretinal fluid, no IRF (Interval improvement in focal central SRF / SRHM).   Notes *Images captured and stored on drive  Diagnosis / Impression:  NFP, no IRF, +SRF OU Interval improvement in focal central SRF / [facility] OU  Clinical management:  See below  Abbreviations: NFP - Normal foveal profile. CME - cystoid macular edema. PED - pigment epithelial detachment. IRF - intraretinal fluid. SRF - subretinal fluid. EZ - ellipsoid zone. ERM - epiretinal membrane. ORA - outer retinal atrophy. ORT - outer retinal tubulation. SRHM - subretinal hyper-reflective material. IRHM - intraretinal hyper-reflective material  ASSESSMENT/PLAN:     ICD-10-CM   1. Intermediate stage nonexudative age-related macular degeneration of both eyes  H35.3132   2. Retinal edema  H35.81 OCT, Retina - OU - Both Eyes  3. Diabetes mellitus type 2 without retinopathy ([place])  E11.9   4. Essential hypertension  I10   5. Hypertensive retinopathy of both eyes  H35.033   6. Combined forms of age-related cataract of both eyes  H25.813   7. Band keratopathy of both eyes  H18.423     1,2. Age related macular degeneration, non-exudative, both eyes  - OCT shows central subfoveal cyst -- vitelliform like lesion OU -- improved -- no heme  - The incidence, anatomy, and pathology of dry AMD, risk of progression, and the AREDS and AREDS 2 study including smoking risks discussed with patient.  - Recommend amsler grid monitoring  - f/u 3 months-- DFE, OCT  3. Diabetes mellitus, type 2 without retinopathy - The incidence, risk factors for progression, natural history and treatment options for diabetic retinopathy  were discussed with patient.   - The need for close monitoring of blood glucose, blood pressure, and serum lipids, avoiding cigarette or any type of tobacco, and the need for long term follow up was also discussed with patient. - f/u in 1 year, sooner prn  4,5. Hypertensive retinopathy OU - discussed importance of tight BP control - monitor  6. Pseudophakia OU  - s/p CE/IOL w/Dr. Lucita Ferrara [phone]  - IOL in good position, doing well - monitor  7. Band keratopathy OU  Ophthalmic Meds Ordered this visit:  No orders of the defined types were placed in this encounter.      Return in about 3 months (around 10/14/2020) for f/u non-exu ARMD OU.  There are no Patient Instructions on file for this visit.   Explained the diagnoses, plan, and follow up with the patient and they expressed understanding.  Patient expressed understanding of the importance of proper follow up care.   This document serves as a record of services personally performed by  Gardiner Sleeper, MD, PhD. It was created on their behalf by Estill Bakes, COT an ophthalmic technician. The creation of this record is the provider's dictation and/or activities during the visit.    Electronically signed by: Estill Bakes, COT 2.22.22 @ 8:50 AM   This document serves as a record of services personally performed by Gardiner Sleeper, MD, PhD. It was created on their behalf by San Jetty. Owens Shark, OA an ophthalmic technician. The creation of this record is the provider's dictation and/or activities during the visit.    Electronically signed by: San Jetty. Owens Shark, [place] 02.22.2022 8:50 AM  Gardiner Sleeper, M.D., Ph.D. Diseases & Surgery of the Retina and [place] 07/17/2020   I have reviewed the above documentation for accuracy and completeness, and I agree with the above. Gardiner Sleeper, M.D., Ph.D. 07/17/20 8:57 AM   Abbreviations: M myopia (nearsighted); A astigmatism; H hyperopia (farsighted); P presbyopia; Mrx spectacle prescription;  CTL contact lenses; OD right eye; OS left eye; OU both eyes  XT exotropia; ET esotropia; PEK punctate epithelial keratitis; PEE punctate epithelial erosions; DES dry eye syndrome; MGD meibomian gland dysfunction; ATs artificial tears; PFAT's preservative free artificial tears; [place] nuclear sclerotic cataract; PSC posterior subcapsular cataract; ERM epi-retinal membrane; PVD posterior vitreous detachment; RD retinal detachment; DM diabetes mellitus; DR diabetic retinopathy; NPDR non-proliferative diabetic retinopathy; PDR proliferative diabetic retinopathy; CSME clinically significant macular edema; DME diabetic  macular edema; dbh dot blot hemorrhages; CWS cotton wool spot; POAG primary open angle glaucoma; C/D cup-to-disc ratio; HVF humphrey visual field; GVF goldmann visual field; OCT optical coherence tomography; IOP intraocular pressure; BRVO Branch retinal vein occlusion; CRVO central retinal vein occlusion; CRAO central  retinal artery occlusion; BRAO branch retinal artery occlusion; RT retinal tear; SB scleral buckle; PPV pars plana vitrectomy; VH Vitreous hemorrhage; PRP panretinal laser photocoagulation; IVK intravitreal kenalog; VMT vitreomacular traction; MH Macular hole;  NVD neovascularization of the disc; NVE neovascularization elsewhere; AREDS age related eye disease study; ARMD age related macular degeneration; POAG primary open angle glaucoma; EBMD epithelial/anterior basement membrane dystrophy; ACIOL anterior chamber intraocular lens; IOL intraocular lens; PCIOL posterior chamber intraocular lens; Phaco/IOL phacoemulsification with intraocular lens placement; [place] photorefractive keratectomy; LASIK laser assisted in situ keratomileusis; HTN hypertension; DM diabetes mellitus; COPD chronic obstructive pulmonary disease

## 2020-07-17 ENCOUNTER — Other Ambulatory Visit: Payer: Self-pay

## 2020-07-17 ENCOUNTER — Ambulatory Visit (INDEPENDENT_AMBULATORY_CARE_PROVIDER_SITE_OTHER): Payer: Medicare Other | Admitting: Ophthalmology

## 2020-07-17 ENCOUNTER — Encounter (INDEPENDENT_AMBULATORY_CARE_PROVIDER_SITE_OTHER): Payer: Self-pay | Admitting: Ophthalmology

## 2020-07-17 DIAGNOSIS — H353132 Nonexudative age-related macular degeneration, bilateral, intermediate dry stage: Secondary | ICD-10-CM | POA: Diagnosis not present

## 2020-07-17 DIAGNOSIS — E119 Type 2 diabetes mellitus without complications: Secondary | ICD-10-CM

## 2020-07-17 DIAGNOSIS — H25813 Combined forms of age-related cataract, bilateral: Secondary | ICD-10-CM

## 2020-07-17 DIAGNOSIS — I10 Essential (primary) hypertension: Secondary | ICD-10-CM

## 2020-07-17 DIAGNOSIS — H18423 Band keratopathy, bilateral: Secondary | ICD-10-CM | POA: Diagnosis not present

## 2020-07-17 DIAGNOSIS — H3581 Retinal edema: Secondary | ICD-10-CM | POA: Diagnosis not present

## 2020-07-17 DIAGNOSIS — H35033 Hypertensive retinopathy, bilateral: Secondary | ICD-10-CM | POA: Diagnosis not present

## 2020-07-18 ENCOUNTER — Ambulatory Visit: Payer: Medicare Other | Admitting: Dermatology

## 2020-07-18 ENCOUNTER — Encounter: Payer: Self-pay | Admitting: Dermatology

## 2020-07-18 DIAGNOSIS — D229 Melanocytic nevi, unspecified: Secondary | ICD-10-CM

## 2020-07-18 DIAGNOSIS — L820 Inflamed seborrheic keratosis: Secondary | ICD-10-CM

## 2020-07-18 DIAGNOSIS — L578 Other skin changes due to chronic exposure to nonionizing radiation: Secondary | ICD-10-CM

## 2020-07-18 DIAGNOSIS — Z1283 Encounter for screening for malignant neoplasm of skin: Secondary | ICD-10-CM

## 2020-07-18 DIAGNOSIS — L720 Epidermal cyst: Secondary | ICD-10-CM | POA: Diagnosis not present

## 2020-07-18 DIAGNOSIS — D1800 Hemangioma unspecified site: Secondary | ICD-10-CM

## 2020-07-18 DIAGNOSIS — L814 Other melanin hyperpigmentation: Secondary | ICD-10-CM

## 2020-07-18 DIAGNOSIS — L821 Other seborrheic keratosis: Secondary | ICD-10-CM | POA: Diagnosis not present

## 2020-07-18 NOTE — Progress Notes (Signed)
   New Patient Visit  Subjective  Victoria Dalton is a 87 y.o. female who presents for the following: Annual Exam (Would like several brown spots checked on face, neck, torso. Increasing in number. Some are rubbed and irritated by clothing. ) and Cyst (C/O cyst on back. States).  Has been inflamed in past and drained.  Wants it checked.   Objective  Well appearing patient in no apparent distress; mood and affect are within normal limits.  Review of Systems: No other skin or systemic complaints except as noted in HPI or Assessment and Plan.  All skin waist up examined.  Objective  Left neck x1, left temple x1, sacrum x1, left upper arm x1, right antecubitum x1, left shoulder x1, spinal lower back x1 (7): Erythematous keratotic or waxy stuck-on papule   Objective  Left mid Lower Back: 2.0cm firm subcutaneous nodule  Assessment & Plan    Lentigines - Scattered tan macules - Due to sun exposure - Benign-appering, observe - Recommend daily broad spectrum sunscreen SPF 30+ to sun-exposed areas, reapply every 2 hours as needed. - Call for any changes  Seborrheic Keratoses - Stuck-on, waxy, tan-brown papules and plaques  - Discussed benign etiology and prognosis. - Observe - Call for any changes  Melanocytic Nevi - Tan-brown and/or pink-flesh-colored symmetric macules and papules - Benign appearing on exam today - Observation - Call clinic for new or changing moles - Recommend daily use of broad spectrum spf 30+ sunscreen to sun-exposed areas.   Hemangiomas - Red papules - Discussed benign nature - Observe - Call for any changes  Actinic Damage - Chronic, secondary to cumulative UV/sun exposure - diffuse scaly erythematous macules with underlying dyspigmentation - Recommend daily broad spectrum sunscreen SPF 30+ to sun-exposed areas, reapply every 2 hours as needed.  - Call for new or changing lesions.  Skin cancer screening performed today.  Inflamed seborrheic  keratosis (7) Left neck x1, left temple x1, sacrum x1, left upper arm x1, right antecubitum x1, left shoulder x1, spinal lower back x1  Recommend using Gold Bond Rough and Bumpy cream to soften rough dry spots  Destruction of lesion - Left neck x1, left temple x1, sacrum x1, left upper arm x1, right antecubitum x1, left shoulder x1, spinal lower back x1  Destruction method: cryotherapy   Informed consent: discussed and consent obtained   Lesion destroyed using liquid nitrogen: Yes   Region frozen until ice ball extended beyond lesion: Yes   Outcome: patient tolerated procedure well with no complications   Post-procedure details: wound care instructions given    Epidermal inclusion cyst Left mid Lower Back  Benign-appearing. Exam most consistent with an epidermal inclusion cyst. Discussed that a cyst is a benign growth that can grow over time and sometimes get irritated or inflamed. Recommend observation if it is not bothersome. Discussed option of surgical excision to remove it if it is growing, symptomatic, or other changes noted. Please call for new or changing lesions so they can be evaluated.    Pt will call call for surgery appointment if would like removed.   Return if symptoms worsen or fail to improve.   I, Emelia Salisbury, CMA, am acting as scribe for Brendolyn Patty, MD.  Documentation: I have reviewed the above documentation for accuracy and completeness, and I agree with the above.  Brendolyn Patty MD

## 2020-07-18 NOTE — Patient Instructions (Addendum)
Seborrheic Keratosis  What causes seborrheic keratoses? Seborrheic keratoses are harmless, common skin growths that first appear during adult life.  As time goes by, more growths appear.  Some people may develop a large number of them.  Seborrheic keratoses appear on both covered and uncovered body parts.  They are not caused by sunlight.  The tendency to develop seborrheic keratoses can be inherited.  They vary in color from skin-colored to gray, brown, or even black.  They can be either smooth or have a rough, warty surface.   Seborrheic keratoses are superficial and look as if they were stuck on the skin.  Under the microscope this type of keratosis looks like layers upon layers of skin.  That is why at times the top layer may seem to fall off, but the rest of the growth remains and re-grows.    Treatment Seborrheic keratoses do not need to be treated, but can easily be removed in the office.  Seborrheic keratoses often cause symptoms when they rub on clothing or jewelry.  Lesions can be in the way of shaving.  If they become inflamed, they can cause itching, soreness, or burning.  Removal of a seborrheic keratosis can be accomplished by freezing, burning, or surgery. If any spot bleeds, scabs, or grows rapidly, please return to have it checked, as these can be an indication of a skin cancer.   Cryotherapy Aftercare  . Wash gently with soap and water everyday.   Marland Kitchen Apply Vaseline and Band-Aid daily until healed.  Prior to procedure, discussed risks of blister formation, small wound, skin dyspigmentation, or rare scar following cryotherapy.    Recommend using Gold Bond Rough and Bumpy cream for rough dry areas.

## 2020-07-20 ENCOUNTER — Other Ambulatory Visit: Payer: Self-pay | Admitting: Internal Medicine

## 2020-09-10 ENCOUNTER — Other Ambulatory Visit: Payer: Self-pay | Admitting: Internal Medicine

## 2020-10-06 ENCOUNTER — Emergency Department
Admission: EM | Admit: 2020-10-06 | Discharge: 2020-10-07 | Disposition: A | Discharge status: Home / Self Care | Payer: Medicare Other | Attending: Emergency Medicine | Admitting: Emergency Medicine

## 2020-10-06 ENCOUNTER — Other Ambulatory Visit: Payer: Self-pay

## 2020-10-06 DIAGNOSIS — E119 Type 2 diabetes mellitus without complications: Secondary | ICD-10-CM | POA: Insufficient documentation

## 2020-10-06 DIAGNOSIS — Z7984 Long term (current) use of oral hypoglycemic drugs: Secondary | ICD-10-CM | POA: Diagnosis not present

## 2020-10-06 DIAGNOSIS — Z79899 Other long term (current) drug therapy: Secondary | ICD-10-CM | POA: Diagnosis not present

## 2020-10-06 DIAGNOSIS — K529 Noninfective gastroenteritis and colitis, unspecified: Secondary | ICD-10-CM | POA: Diagnosis not present

## 2020-10-06 DIAGNOSIS — Z7982 Long term (current) use of aspirin: Secondary | ICD-10-CM | POA: Diagnosis not present

## 2020-10-06 DIAGNOSIS — R112 Nausea with vomiting, unspecified: Secondary | ICD-10-CM | POA: Diagnosis present

## 2020-10-06 DIAGNOSIS — I10 Essential (primary) hypertension: Secondary | ICD-10-CM | POA: Insufficient documentation

## 2020-10-06 DIAGNOSIS — R9431 Abnormal electrocardiogram [ECG] [EKG]: Secondary | ICD-10-CM | POA: Diagnosis not present

## 2020-10-06 DIAGNOSIS — R404 Transient alteration of awareness: Secondary | ICD-10-CM | POA: Diagnosis not present

## 2020-10-06 DIAGNOSIS — R000 Tachycardia, unspecified: Secondary | ICD-10-CM | POA: Diagnosis not present

## 2020-10-06 DIAGNOSIS — R0902 Hypoxemia: Secondary | ICD-10-CM | POA: Diagnosis not present

## 2020-10-06 DIAGNOSIS — Z743 Need for continuous supervision: Secondary | ICD-10-CM | POA: Diagnosis not present

## 2020-10-06 DIAGNOSIS — I499 Cardiac arrhythmia, unspecified: Secondary | ICD-10-CM | POA: Diagnosis not present

## 2020-10-06 LAB — COMPREHENSIVE METABOLIC PANEL
ALT: 17 U/L (ref 0–44)
AST: 26 U/L (ref 15–41)
Albumin: 4 g/dL (ref 3.5–5.0)
Alkaline Phosphatase: 108 U/L (ref 38–126)
Anion gap: 12 (ref 5–15)
BUN: 25 mg/dL — ABNORMAL HIGH (ref 8–23)
CO2: 25 mmol/L (ref 22–32)
Calcium: 8.9 mg/dL (ref 8.9–10.3)
Chloride: 101 mmol/L (ref 98–111)
Creatinine, Ser: 1.02 mg/dL — ABNORMAL HIGH (ref 0.44–1.00)
GFR, Estimated: 53 mL/min — ABNORMAL LOW (ref >60)
Glucose, Bld: 212 mg/dL — ABNORMAL HIGH (ref 70–99)
Potassium: 4.4 mmol/L (ref 3.5–5.1)
Sodium: 138 mmol/L (ref 135–145)
Total Bilirubin: 0.8 mg/dL (ref 0.3–1.2)
Total Protein: 7 g/dL (ref 6.5–8.1)

## 2020-10-06 LAB — CBC WITH DIFFERENTIAL/PLATELET
Abs Immature Granulocytes: 0.06 10*3/uL (ref 0.00–0.07)
Basophils Absolute: 0.1 10*3/uL (ref 0.0–0.1)
Basophils Relative: 0 %
Eosinophils Absolute: 0.1 10*3/uL (ref 0.0–0.5)
Eosinophils Relative: 1 %
HCT: 42.6 % (ref 36.0–46.0)
Hemoglobin: 14.2 g/dL (ref 12.0–15.0)
Immature Granulocytes: 0 %
Lymphocytes Relative: 5 %
Lymphs Abs: 0.9 10*3/uL (ref 0.7–4.0)
MCH: 30.7 pg (ref 26.0–34.0)
MCHC: 33.3 g/dL (ref 30.0–36.0)
MCV: 92.2 fL (ref 80.0–100.0)
Monocytes Absolute: 1.2 10*3/uL — ABNORMAL HIGH (ref 0.1–1.0)
Monocytes Relative: 7 %
Neutro Abs: 14.8 10*3/uL — ABNORMAL HIGH (ref 1.7–7.7)
Neutrophils Relative %: 87 %
Platelets: 203 10*3/uL (ref 150–400)
RBC: 4.62 MIL/uL (ref 3.87–5.11)
RDW: 12.8 % (ref 11.5–15.5)
WBC: 17.1 10*3/uL — ABNORMAL HIGH (ref 4.0–10.5)
nRBC: 0 % (ref 0.0–0.2)

## 2020-10-06 LAB — LIPASE, BLOOD: Lipase: 37 U/L (ref 11–51)

## 2020-10-06 MED ORDER — SODIUM CHLORIDE 0.9 % IV BOLUS
500.0000 mL | Freq: Once | INTRAVENOUS | Status: AC
  Administered 2020-10-06: 500 mL via INTRAVENOUS

## 2020-10-06 NOTE — ED Triage Notes (Signed)
Patient from home via [facility] with c/o N/V/D since 7pm tonight. EMS gave 4mg  zofran.  En route patient noted to be AFIB RVR on monitor, given 2.5 metoprolol, converted to SR with EMS.  Patient is A&Ox4.

## 2020-10-06 NOTE — ED Provider Notes (Signed)
[facility] Emergency Department Provider Note  ____________________________________________   I have reviewed the triage vital signs and the nursing notes.   HISTORY  Chief Complaint Emesis and Diarrhea   History limited by: Not Limited   HPI Victoria Dalton is a 87 y.o. female who presents to the emergency department today because of concern for nausea, vomiting and diarrhea. The patient states that the symptoms came on suddenly this evening. The patient had a good day with her daughter earlier. They then got some fish and shortly thereafter the patient started feeling sick to her stomach.  She then had multiple episodes of emesis.  She then developed diarrhea.  She denies any associated abdominal pain.  The patient states that she has had similar episodes although many years in the past and prior to her gallbladder surgery.  Patient denies any recent fevers.  No shortness of breath or chest pain.   Records reviewed. Per medical record review patient has a history of DM, HTN, HLD.   Past Medical History:  Diagnosis Date  . Diabetes mellitus without complication ([place])   . Dyspnea   . Gallbladder calculus   . GERD (gastroesophageal reflux disease)   . Hyperlipidemia   . Hypertension   . Hypertensive retinopathy    OU  . Kidney stone   . Macular degeneration    Dry OU  . Mass   . Parotid swelling   . UTI (lower urinary tract infection)     Patient Active Problem List   Diagnosis Date Noted  . Candidiasis of breast 05/31/2020  . Need for influenza vaccination 03/29/2020  . Class 3 severe obesity due to excess calories in adult ([place]) 11/16/2019  . Cystitis 11/16/2019  . Vaginitis 10/25/2019  . Blood in stool   . Gallbladder mass   . Cholangitis   . Cholecystitis   . Right upper quadrant pain   . Hyperbilirubinemia   . Protein-calorie malnutrition, severe 01/11/2016  . Cholecystitis with cholelithiasis 01/10/2016  . Benign essential HTN  12/06/2015  . Dyslipidemia 05/23/2015  . Gastro-esophageal reflux disease without esophagitis 05/08/2014  . Controlled type 2 diabetes mellitus without complication ([place]) 18/29/9371  . Type 2 diabetes mellitus without complication ([place]) 69/67/8938    Past Surgical History:  Procedure Laterality Date  . CATARACT EXTRACTION Bilateral 02/2020   Dr. Vevelyn Royals  . CHOLECYSTECTOMY N/A 04/30/2016   Procedure: CHOLECYSTECTOMY;  Surgeon: Jules Husbands, MD;  Location: [facility] ORS;  Service: General;  Laterality: N/A;  . DIAGNOSTIC LAPAROSCOPIC LIVER BIOPSY  02/28/2016   Procedure: DIAGNOSTIC LAPAROSCOPIC LIVER BIOPSY;  Surgeon: Jules Husbands, MD;  Location: [facility] ORS;  Service: General;;  . ERCP N/A 01/13/2016   Procedure: ENDOSCOPIC RETROGRADE CHOLANGIOPANCREATOGRAPHY (ERCP);  Surgeon: Lucilla Lame, MD;  Location: [facility] ENDOSCOPY;  Service: Endoscopy;  Laterality: N/A;  . ERCP N/A 04/08/2016   Procedure: ENDOSCOPIC RETROGRADE CHOLANGIOPANCREATOGRAPHY (ERCP) Stent removal;  Surgeon: Lucilla Lame, MD;  Location: [facility] ENDOSCOPY;  Service: Endoscopy;  Laterality: N/A;  . EYE SURGERY Bilateral 02/2020   Cat Sx - Dr. Vevelyn Royals  . INTRAOPERATIVE CHOLANGIOGRAM  04/30/2016   Procedure: INTRAOPERATIVE CHOLANGIOGRAM;  Surgeon: Jules Husbands, MD;  Location: [facility] ORS;  Service: General;;  . KNEE ARTHROSCOPY Left   . LAPAROSCOPY  02/28/2016   Procedure: Diagnotic laparoscopy with omental biopsy;  Surgeon: Jules Husbands, MD;  Location: [facility] ORS;  Service: General;;  . TONSILLECTOMY      Prior to Admission medications   Medication Sig Start Date  End Date Taking? Authorizing Provider  acetaminophen (TYLENOL) 500 MG tablet Take 500 mg by mouth every 6 (six) hours as needed for mild pain.    [provider]  aspirin EC 81 MG tablet Take 81 mg by mouth daily.    [provider]  calcium carbonate (OS-CAL) 1250 (500 Ca) MG chewable tablet Chew by mouth.    [provider]  calcium  carbonate (TUMS - DOSED IN MG ELEMENTAL CALCIUM) 500 MG chewable tablet Chew 2 tablets by mouth daily as needed for indigestion or heartburn.    [provider]  clotrimazole-betamethasone (LOTRISONE) cream APP EXT AA BID 11/11/17   [provider]  conjugated estrogens (PREMARIN) vaginal cream Place 1 Applicatorful vaginally 2 (two) times a week. 1 gram vaginally at bedtime twice weekly 12/01/19   Will Bonnet, MD  glimepiride (AMARYL) 2 MG tablet TAKE 1 TABLET BY MOUTH DAILY 09/10/20   Cletis Athens, MD  glucose blood test strip Use as instructed 01/02/20   Cletis Athens, MD  HYDROcodone-acetaminophen (NORCO/VICODIN) 5-325 MG tablet Take one tablet at night for pain; may take up to every 6 hours as needed for pain if not working or driving [id number]   [provider]  losartan (COZAAR) 100 MG tablet TAKE 1 TABLET BY MOUTH DAILY 06/28/20   Cletis Athens, MD  losartan-hydrochlorothiazide (HYZAAR) 100-25 MG tablet Take 1 tablet by mouth daily. 04/24/15   [provider]  metFORMIN (GLUCOPHAGE) 500 MG tablet Take 1 tablet by mouth 3 (three) times daily. 11/02/15   [provider]  metoprolol succinate (TOPROL-XL) 100 MG 24 hr tablet TAKE 1 TABLET BY MOUTH DAILY 11/23/19   Cletis Athens, MD  metoprolol succinate (TOPROL-XL) 100 MG 24 hr tablet TAKE 1 TABLET BY MOUTH DAILY 07/20/20   Beckie Salts, FNP  Multiple Vitamins-Minerals (PRESERVISION AREDS PO) Take 1 capsule by mouth 2 (two) times daily.     [provider]  nystatin-triamcinolone ointment (MYCOLOG) Apply 1 application topically 2 (two) times daily. 05/31/20   Beckie Salts, FNP  omeprazole (PRILOSEC) 20 MG capsule Take by mouth. 05/08/14   [provider]  ondansetron (ZOFRAN) 4 MG tablet Take 1 tablet (4 mg total) by mouth every 8 (eight) hours as needed for nausea or vomiting. 06/05/16   Rudene Re, MD  ONE [facility] ULTRA TEST test strip  11/02/15   [provider]   pantoprazole (PROTONIX) 40 MG tablet TAKE 1 TABLET(40 MG) BY MOUTH DAILY 07/20/20   Beckie Salts, FNP  phenazopyridine (PYRIDIUM) 95 MG tablet Take 1 tablet (95 mg total) by mouth 3 (three) times daily as needed for pain. 11/03/19   Cletis Athens, MD  promethazine (PHENERGAN) 12.5 MG tablet Take by mouth. 09/29/15   [provider]  simvastatin (ZOCOR) 40 MG tablet TAKE 1 TABLET BY MOUTH DAILY 07/20/20   Beckie Salts, FNP  sitaGLIPtin (JANUVIA) 50 MG tablet Take 25 mg by mouth daily.     [provider]    Allergies Chlorhexidine, Poison oak extract [poison oak extract], Ramipril, Penicillins, and Sulfa antibiotics  Family History  Problem Relation Age of Onset  . Cancer Father   . Heart attack Father   . Cancer Brother   . Diabetes Brother   . Diabetes Son   . Breast cancer Neg Hx     Social History Social History   Tobacco Use  . Smoking status: Never Smoker  . Smokeless tobacco: Never Used  Substance Use Topics  . Alcohol use: No  .  Drug use: No    Review of Systems Constitutional: No fever/chills Eyes: No visual changes. ENT: No sore throat. Cardiovascular: Denies chest pain. Respiratory: Denies shortness of breath. Gastrointestinal: No abdominal pain.  Positive for nausea, vomiting and diarrhea.  Genitourinary: Negative for dysuria. Musculoskeletal: Negative for back pain. Skin: Negative for rash. Neurological: Negative for headaches, focal weakness or numbness.  ____________________________________________   PHYSICAL EXAM:  VITAL SIGNS: ED Triage Vitals  Enc Vitals Group     BP 10/06/20 2205 123/70     Pulse Rate 10/06/20 2205 70     Resp 10/06/20 2205 14     Temp --      Temp src --      SpO2 10/06/20 2205 90 %     Weight 10/06/20 2206 250 lb (113.4 kg)     Height 10/06/20 2206 5\' 9"  (1.753 m)     Head Circumference --      Peak Flow --      Pain Score 10/06/20 2205 0   Constitutional: Alert and oriented.  Eyes: Conjunctivae  are normal.  ENT      Head: Normocephalic and atraumatic.      Nose: No congestion/rhinnorhea.      Mouth/Throat: Mucous membranes are moist.      Neck: No stridor. Hematological/Lymphatic/Immunilogical: No cervical lymphadenopathy. Cardiovascular: Normal rate, regular rhythm.  No murmurs, rubs, or gallops.  Respiratory: Normal respiratory effort without tachypnea nor retractions. Breath sounds are clear and equal bilaterally. No wheezes/rales/rhonchi. Gastrointestinal: Soft and non tender. No rebound. No guarding.  Genitourinary: Deferred Musculoskeletal: Normal range of motion in all extremities. No lower extremity edema. Neurologic:  Normal speech and language. No gross focal neurologic deficits are appreciated.  Skin:  Skin is warm, dry and intact. No rash noted. Psychiatric: Mood and affect are normal. Speech and behavior are normal. Patient exhibits appropriate insight and judgment.  ____________________________________________    LABS (pertinent positives/negatives)  Lipase 37 CBC wbc 17.1, hgb 14.2, plt 203 CMP wnl except glu 212, BUN 25, Cr 1.02  ____________________________________________   EKG  I, Nance Pear, attending physician, personally viewed and interpreted this EKG  EKG Time: 2215 Rate: 71 Rhythm: sinus rhythm Axis: left axis deviation Intervals: qtc 455 QRS: RBBB ST changes: no st elevation Impression: abnormal ekg   ____________________________________________    RADIOLOGY  None  ____________________________________________   PROCEDURES  Procedures  ____________________________________________   INITIAL IMPRESSION / ASSESSMENT AND PLAN / ED COURSE  Pertinent labs & imaging results that were available during my care of the patient were reviewed by me and considered in my medical decision making (see chart for details).   Patient presented to the emergency department today because of concerns for nausea vomiting and diarrhea.   Symptoms did start suddenly and shortly after she ate some fish.  Patient not complaining of any abdominal pain and the abdomen is benign.  Patient's blood work does show a leukocytosis was think is likely related to the gastroenteritis which at this time I would imagine is foodborne.  Will plan on giving patient fluids.  She received antiemetics by EMS.  Will then p.o. challenge.  I do think patient is a successful p.o. challenge continues to be without any abdominal pain and no emergent imaging would be required.  However she starts developing abdominal pain then reassessment would be necessary. ____________________________________________   FINAL CLINICAL IMPRESSION(S) / ED DIAGNOSES  Final diagnoses:  Gastroenteritis     Note: This dictation was prepared with Dragon dictation. Any  transcriptional errors that result from this process are unintentional     Nance Pear, MD 10/06/20 2352

## 2020-10-07 MED ORDER — ONDANSETRON 4 MG PO TBDP: 4.0000 mg | ORAL_TABLET | Freq: Three times a day (TID) | ORAL | 0 refills | Status: DC | PRN

## 2020-10-07 NOTE — ED Provider Notes (Signed)
-----------------------------------------   2:11 AM on 10/07/2020 -----------------------------------------  Patient tolerated PO without emesis.  Will discharge home with prescription for Zofran to use as needed.  Strict return precautions given.  Patient verbalizes understanding and agrees with plan of care.   Paulette Blanch, MD 10/07/20 [phone]

## 2020-10-07 NOTE — Discharge Instructions (Signed)
1.  You may take Zofran as needed for nausea/vomiting. °2.  Clear liquids x12 hours, then BRAT diet x3 days, then slowly advance diet as tolerated. °3.  Return to the ER for worsening symptoms, persistent vomiting, difficulty breathing or other concerns. °

## 2020-10-07 NOTE — ED Notes (Signed)
Patient placed on pure wick °

## 2020-10-10 ENCOUNTER — Other Ambulatory Visit: Payer: Self-pay | Admitting: Family Medicine

## 2020-10-10 NOTE — Progress Notes (Signed)
[facility] Note  10/16/2020     CHIEF COMPLAINT Patient presents for Retina Follow Up   HISTORY OF PRESENT ILLNESS: Victoria Dalton is a 87 y.o. female who presents to the clinic today for:  HPI    Retina Follow Up    Patient presents with  Dry AMD.  In both eyes.  Duration of 3 months.  Since onset it is stable.  I, the attending physician,  performed the HPI with the patient and updated documentation appropriately.          Comments    3 month follow up ARMD OU- Doing well. No change in vision OU. Using At's prn. BS 166 this morning A1C 8.4       Last edited by Bernarda Caffey, MD on 10/16/2020  1:10 PM. (History)     Pt states her vision has "been good", pt is using AT's for dryness, she states her left eye waters a lot  Referring physician: Vevelyn Royals, MD [address] [place], [place] [address]  HISTORICAL INFORMATION:   Selected notes from the [facility] Referred by Dr. Lucita Ferrara LEE: 02/22/2020 BCVA: OD 20/70, OS 20/70 Ocular Hx- Mac cyst OU, Cataracts OU, Band Keratopathy OU, ARMD OU PMH- DM   CURRENT MEDICATIONS: No current outpatient medications on file. (Ophthalmic Drugs)   No current facility-administered medications for this visit. (Ophthalmic Drugs)   Current Outpatient Medications (Other)  Medication Sig  . acetaminophen (TYLENOL) 500 MG tablet Take 500 mg by mouth every 6 (six) hours as needed for mild pain.  Marland Kitchen aspirin EC 81 MG tablet Take 81 mg by mouth daily.  . calcium carbonate (OS-CAL) 1250 (500 Ca) MG chewable tablet Chew by mouth.  . calcium carbonate (TUMS - DOSED IN MG ELEMENTAL CALCIUM) 500 MG chewable tablet Chew 2 tablets by mouth daily as needed for indigestion or heartburn.  . clotrimazole-betamethasone (LOTRISONE) cream APP EXT AA BID  . conjugated estrogens (PREMARIN) vaginal cream Place 1 Applicatorful vaginally 2 (two) times a week. 1 gram vaginally at bedtime twice weekly  .  glimepiride (AMARYL) 2 MG tablet TAKE 1 TABLET BY MOUTH DAILY  . losartan (COZAAR) 100 MG tablet TAKE 1 TABLET BY MOUTH DAILY  . losartan-hydrochlorothiazide (HYZAAR) 100-25 MG tablet Take 1 tablet by mouth daily.  . metFORMIN (GLUCOPHAGE) 500 MG tablet Take 1 tablet by mouth 3 (three) times daily.  . metoprolol succinate (TOPROL-XL) 100 MG 24 hr tablet TAKE 1 TABLET BY MOUTH DAILY  . metoprolol succinate (TOPROL-XL) 100 MG 24 hr tablet TAKE 1 TABLET BY MOUTH DAILY  . Multiple Vitamins-Minerals (PRESERVISION AREDS PO) Take 1 capsule by mouth 2 (two) times daily.   Marland Kitchen nystatin-triamcinolone ointment (MYCOLOG) Apply 1 application topically 2 (two) times daily.  Marland Kitchen omeprazole (PRILOSEC) 20 MG capsule Take by mouth.  . ondansetron (ZOFRAN ODT) 4 MG disintegrating tablet Take 1 tablet (4 mg total) by mouth every 8 (eight) hours as needed for nausea or vomiting.  . pantoprazole (PROTONIX) 40 MG tablet TAKE 1 TABLET(40 MG) BY MOUTH DAILY  . phenazopyridine (PYRIDIUM) 95 MG tablet Take 1 tablet (95 mg total) by mouth 3 (three) times daily as needed for pain.  . simvastatin (ZOCOR) 40 MG tablet TAKE 1 TABLET BY MOUTH DAILY  . sitaGLIPtin (JANUVIA) 50 MG tablet Take 25 mg by mouth daily.   Marland Kitchen glucose blood test strip Use as instructed  . HYDROcodone-acetaminophen (NORCO/VICODIN) 5-325 MG tablet Take one tablet at night  for pain; may take up to every 6 hours as needed for pain if not working or driving (Patient not taking: Reported on 10/16/2020)  . ondansetron (ZOFRAN) 4 MG tablet Take 1 tablet (4 mg total) by mouth every 8 (eight) hours as needed for nausea or vomiting. (Patient not taking: Reported on 10/16/2020)  . ONE TOUCH ULTRA TEST test strip   . promethazine (PHENERGAN) 12.5 MG tablet Take by mouth. (Patient not taking: Reported on 10/16/2020)   No current facility-administered medications for this visit. (Other)      REVIEW OF SYSTEMS: ROS    Positive for: Gastrointestinal, Endocrine, Eyes    Negative for: Constitutional, Neurological, Skin, Genitourinary, Musculoskeletal, HENT, Cardiovascular, Respiratory, Psychiatric, Allergic/Imm, Heme/Lymph   Last edited by Leonie Douglas, COA on 10/16/2020  8:14 AM. (History)       ALLERGIES Allergies  Allergen Reactions  . Chlorhexidine Itching  . [place] Extract]   . Ramipril Other (See Comments)    IRREGULAR HEART BEAT  . Penicillins Rash    Has patient had a PCN reaction causing immediate rash, facial/tongue/throat swelling, SOB or lightheadedness with hypotension: Yes Has patient had a PCN reaction causing severe rash involving mucus membranes or skin necrosis: Yes Has patient had a PCN reaction that required hospitalization No Has patient had a PCN reaction occurring within the last 10 years: No If all of the above answers are "NO", then may proceed with Cephalosporin use.   . Sulfa Antibiotics Rash    PAST MEDICAL HISTORY Past Medical History:  Diagnosis Date  . Diabetes mellitus without complication ([place])   . Dyspnea   . Gallbladder calculus   . GERD (gastroesophageal reflux disease)   . Hyperlipidemia   . Hypertension   . Hypertensive retinopathy    OU  . Kidney stone   . Macular degeneration    Dry OU  . Mass   . Parotid swelling   . UTI (lower urinary tract infection)    Past Surgical History:  Procedure Laterality Date  . CATARACT EXTRACTION Bilateral 02/2020   Dr. Vevelyn Royals  . CHOLECYSTECTOMY N/A 04/30/2016   Procedure: CHOLECYSTECTOMY;  Surgeon: Jules Husbands, MD;  Location: [facility] ORS;  Service: General;  Laterality: N/A;  . DIAGNOSTIC LAPAROSCOPIC LIVER BIOPSY  02/28/2016   Procedure: DIAGNOSTIC LAPAROSCOPIC LIVER BIOPSY;  Surgeon: Jules Husbands, MD;  Location: [facility] ORS;  Service: General;;  . ERCP N/A 01/13/2016   Procedure: ENDOSCOPIC RETROGRADE CHOLANGIOPANCREATOGRAPHY (ERCP);  Surgeon: Lucilla Lame, MD;  Location: [facility] ENDOSCOPY;  Service: Endoscopy;  Laterality: N/A;  .  ERCP N/A 04/08/2016   Procedure: ENDOSCOPIC RETROGRADE CHOLANGIOPANCREATOGRAPHY (ERCP) Stent removal;  Surgeon: Lucilla Lame, MD;  Location: [facility] ENDOSCOPY;  Service: Endoscopy;  Laterality: N/A;  . EYE SURGERY Bilateral 02/2020   Cat Sx - Dr. Vevelyn Royals  . INTRAOPERATIVE CHOLANGIOGRAM  04/30/2016   Procedure: INTRAOPERATIVE CHOLANGIOGRAM;  Surgeon: Jules Husbands, MD;  Location: [facility] ORS;  Service: General;;  . KNEE ARTHROSCOPY Left   . LAPAROSCOPY  02/28/2016   Procedure: Diagnotic laparoscopy with omental biopsy;  Surgeon: Jules Husbands, MD;  Location: [facility] ORS;  Service: General;;  . TONSILLECTOMY      FAMILY HISTORY Family History  Problem Relation Age of Onset  . Cancer Father   . Heart attack Father   . Cancer Brother   . Diabetes Brother   . Diabetes Son   . Breast cancer Neg Hx     SOCIAL HISTORY Social History   Tobacco  Use  . Smoking status: Never Smoker  . Smokeless tobacco: Never Used  Substance Use Topics  . Alcohol use: No  . Drug use: No         OPHTHALMIC EXAM:  Base Eye Exam    Visual Acuity (Snellen - Linear)      Right Left   Dist cc 20/40 20/40   Dist ph cc NI NI       Tonometry (Tonopen, 8:25 AM)      Right Left   Pressure 18 16       Pupils      Dark Light Shape React APD   Right 4 3 Round Brisk None   Left 4 3 Round Brisk None       Visual Fields (Counting fingers)      Left Right    Full Full       Extraocular Movement      Right Left    Full Full       Neuro/Psych    Oriented x3: Yes   Mood/Affect: Normal       Dilation    Both eyes: 1.0% Mydriacyl, 2.5% Phenylephrine @ 8:25 AM        Slit Lamp and Fundus Exam    Slit Lamp Exam      Right Left   Lids/Lashes Dermatochalasis - upper lid Dermatochalasis - upper lid   Conjunctiva/Sclera White and quiet White and quiet   Cornea early band K nasal and temporal, 1-2+PEE centrally, arcus, well healed temporal cataract wounds early band K nasal and temporal, 2+PEE,  arcus, well healed temporal cataract wounds   Anterior Chamber Deep and quiet Deep and quiet   Iris Round and dilated Round and dilated   Lens PC IOL in good position, 1+ Posterior capsular opacification PC IOL in good position, trace Posterior capsular opacification   Vitreous Vitreous syneresis, Posterior vitreous detachment Vitreous syneresis, Posterior vitreous detachment       Fundus Exam      Right Left   Disc mild pallor, Sharp rim, +Peripapillary atrophy Pink and Sharp, temporal Peripapillary atrophy   C/D Ratio 0.5 0.4   Macula Flat, Blunted foveal reflex, RPE mottling, clumping and atrophy centrally, No heme or edema Flat, Blunted foveal reflex, RPE mottling, clumping and atrophy centrally, No heme or edema   Vessels attenuated, mild tortuousity attenuated, mild tortuousity   Periphery Attached, reticular degeneration, No heme  Attached, reticular degeneration, No heme         Refraction    Wearing Rx      Sphere Cylinder Axis Add   Right -1.00 +1.25 168 +3.00   Left -1.00 +1.00 180 +3.00          IMAGING AND PROCEDURES  Imaging and Procedures for 10/16/2020  OCT, Retina - OU - Both Eyes       Right Eye Quality was good. Central Foveal Thickness: 247. Progression has been stable. Findings include normal foveal contour, no IRF, subretinal fluid, subretinal hyper-reflective material (persistent focal central SRF / SRHM).   Left Eye Quality was good. Central Foveal Thickness: 250. Progression has improved. Findings include normal foveal contour, subretinal hyper-reflective material, subretinal fluid, no IRF (Mild Interval improvement in focal central SRF / SRHM).   Notes *Images captured and stored on drive  Diagnosis / Impression:  NFP, no IRF, +SRF OU OD: persistent focal central SRF / SRHM OS: Mild Interval improvement in focal central SRF / [facility]  Clinical management:  See below  Abbreviations:  NFP - Normal foveal profile. CME - cystoid macular edema. PED -  pigment epithelial detachment. IRF - intraretinal fluid. SRF - subretinal fluid. EZ - ellipsoid zone. ERM - epiretinal membrane. ORA - outer retinal atrophy. ORT - outer retinal tubulation. SRHM - subretinal hyper-reflective material. IRHM - intraretinal hyper-reflective material                 ASSESSMENT/PLAN:    ICD-10-CM   1. Intermediate stage nonexudative age-related macular degeneration of both eyes  H35.3132   2. Retinal edema  H35.81 OCT, Retina - OU - Both Eyes  3. Diabetes mellitus type 2 without retinopathy ([place])  E11.9   4. Essential hypertension  I10   5. Hypertensive retinopathy of both eyes  H35.033   6. Band keratopathy of both eyes  H18.423   7. Pseudophakia of both eyes  Z96.1     1,2. Age related macular degeneration, non-exudative, both eyes  - The incidence, anatomy, and pathology of dry AMD, risk of progression, and the AREDS and AREDS 2 studies including smoking risks discussed with patient.  - OCT shows OD: persistent focal central SRF / SRHM; OS: Mild Interval improvement in focal central SRF / SRHM  - Recommend amsler grid monitoring  - f/u 3-4 months-- DFE, OCT  3. Diabetes mellitus, type 2 without retinopathy - The incidence, risk factors for progression, natural history and treatment options for diabetic retinopathy  were discussed with patient.   - The need for close monitoring of blood glucose, blood pressure, and serum lipids, avoiding cigarette or any type of tobacco, and the need for long term follow up was also discussed with patient. - f/u in 1 year, sooner prn  4,5. Hypertensive retinopathy OU - discussed importance of tight BP control - monitor  6. Pseudophakia OU  - s/p CE/IOL w/Dr. Lucita Ferrara [phone]  - IOL in good position, doing well - monitor  7. Band keratopathy OU  Ophthalmic Meds Ordered this visit:  No orders of the defined types were placed in this encounter.      Return for f/u 3-4 months, non-exu ARMD OU, DFE,  OCT.  There are no Patient Instructions on file for this visit.   Explained the diagnoses, plan, and follow up with the patient and they expressed understanding.  Patient expressed understanding of the importance of proper follow up care.   This document serves as a record of services personally performed by Gardiner Sleeper, MD, PhD. It was created on their behalf by Estill Bakes, COT an ophthalmic technician. The creation of this record is the provider's dictation and/or activities during the visit.    Electronically signed by: Estill Bakes, COT 5.18.22 @ 1:14 PM   This document serves as a record of services personally performed by Gardiner Sleeper, MD, PhD. It was created on their behalf by San Jetty. Owens Shark, OA an ophthalmic technician. The creation of this record is the provider's dictation and/or activities during the visit.    Electronically signed by: San Jetty. Owens Shark, [place] 05.24.2022 1:14 PM  Gardiner Sleeper, M.D., Ph.D. Diseases & Surgery of the Retina and Vitreous [facility]  I have reviewed the above documentation for accuracy and completeness, and I agree with the above. Gardiner Sleeper, M.D., Ph.D. 10/16/20 1:14 PM   Abbreviations: M myopia (nearsighted); A astigmatism; H hyperopia (farsighted); P presbyopia; Mrx spectacle prescription;  CTL contact lenses; OD right eye; OS left eye; OU both eyes  XT exotropia; ET esotropia;  PEK punctate epithelial keratitis; PEE punctate epithelial erosions; DES dry eye syndrome; MGD meibomian gland dysfunction; ATs artificial tears; PFAT's preservative free artificial tears; [place] nuclear sclerotic cataract; PSC posterior subcapsular cataract; ERM epi-retinal membrane; PVD posterior vitreous detachment; RD retinal detachment; DM diabetes mellitus; DR diabetic retinopathy; NPDR non-proliferative diabetic retinopathy; PDR proliferative diabetic retinopathy; CSME clinically significant macular edema; DME diabetic macular edema;  dbh dot blot hemorrhages; CWS cotton wool spot; POAG primary open angle glaucoma; C/D cup-to-disc ratio; HVF humphrey visual field; GVF goldmann visual field; OCT optical coherence tomography; IOP intraocular pressure; BRVO Branch retinal vein occlusion; CRVO central retinal vein occlusion; CRAO central retinal artery occlusion; BRAO branch retinal artery occlusion; RT retinal tear; SB scleral buckle; PPV pars plana vitrectomy; VH Vitreous hemorrhage; PRP panretinal laser photocoagulation; IVK intravitreal kenalog; VMT vitreomacular traction; MH Macular hole;  NVD neovascularization of the disc; NVE neovascularization elsewhere; AREDS age related eye disease study; ARMD age related macular degeneration; POAG primary open angle glaucoma; EBMD epithelial/anterior basement membrane dystrophy; ACIOL anterior chamber intraocular lens; IOL intraocular lens; PCIOL posterior chamber intraocular lens; Phaco/IOL phacoemulsification with intraocular lens placement; [place] photorefractive keratectomy; LASIK laser assisted in situ keratomileusis; HTN hypertension; DM diabetes mellitus; COPD chronic obstructive pulmonary disease

## 2020-10-12 ENCOUNTER — Encounter: Payer: Self-pay | Admitting: Family Medicine

## 2020-10-12 ENCOUNTER — Ambulatory Visit (INDEPENDENT_AMBULATORY_CARE_PROVIDER_SITE_OTHER): Payer: Medicare Other | Admitting: Family Medicine

## 2020-10-12 VITALS — BP 143/69 | HR 67 | Ht 69.0 in | Wt 266.5 lb

## 2020-10-12 DIAGNOSIS — K529 Noninfective gastroenteritis and colitis, unspecified: Secondary | ICD-10-CM | POA: Diagnosis not present

## 2020-10-12 DIAGNOSIS — I10 Essential (primary) hypertension: Secondary | ICD-10-CM | POA: Diagnosis not present

## 2020-10-12 DIAGNOSIS — E119 Type 2 diabetes mellitus without complications: Secondary | ICD-10-CM

## 2020-10-12 NOTE — Assessment & Plan Note (Addendum)
Patient had N/V 5 days ago after Zofran and clear diet she has improved. No NV for 2 days.  Plan- Checking BMP today.

## 2020-10-12 NOTE — Assessment & Plan Note (Signed)
Patient's blood pressure is within the desired range. Medication side effects include: no side effects noted Continue current treatment regimen.  

## 2020-10-12 NOTE — Assessment & Plan Note (Signed)
Diabetes mellitus Type II, under fair control.. Discussed general issues about diabetes pathophysiology and management.   Plan- A1C today

## 2020-10-12 NOTE — Progress Notes (Signed)
Established Patient Office Visit  SUBJECTIVE:  Subjective  Patient ID: Victoria Dalton, female    DOB: 04/10/1931  Age: 87 y.o. MRN: [id number]  CC:  Chief Complaint  Patient presents with  . Emesis    Patient has been vomiting and diarrhea since Saturday. Patient was seen at ER and was told it was food poisoning, pt was given Zofran for nausea.     HPI Victoria Dalton is a 87 y.o. female presenting today for     Past Medical History:  Diagnosis Date  . Diabetes mellitus without complication ([place])   . Dyspnea   . Gallbladder calculus   . GERD (gastroesophageal reflux disease)   . Hyperlipidemia   . Hypertension   . Hypertensive retinopathy    OU  . Kidney stone   . Macular degeneration    Dry OU  . Mass   . Parotid swelling   . UTI (lower urinary tract infection)     Past Surgical History:  Procedure Laterality Date  . CATARACT EXTRACTION Bilateral 02/2020   Dr. Vevelyn Royals  . CHOLECYSTECTOMY N/A 04/30/2016   Procedure: CHOLECYSTECTOMY;  Surgeon: Jules Husbands, MD;  Location: [facility] ORS;  Service: General;  Laterality: N/A;  . DIAGNOSTIC LAPAROSCOPIC LIVER BIOPSY  02/28/2016   Procedure: DIAGNOSTIC LAPAROSCOPIC LIVER BIOPSY;  Surgeon: Jules Husbands, MD;  Location: [facility] ORS;  Service: General;;  . ERCP N/A 01/13/2016   Procedure: ENDOSCOPIC RETROGRADE CHOLANGIOPANCREATOGRAPHY (ERCP);  Surgeon: Lucilla Lame, MD;  Location: [facility] ENDOSCOPY;  Service: Endoscopy;  Laterality: N/A;  . ERCP N/A 04/08/2016   Procedure: ENDOSCOPIC RETROGRADE CHOLANGIOPANCREATOGRAPHY (ERCP) Stent removal;  Surgeon: Lucilla Lame, MD;  Location: [facility] ENDOSCOPY;  Service: Endoscopy;  Laterality: N/A;  . EYE SURGERY Bilateral 02/2020   Cat Sx - Dr. Vevelyn Royals  . INTRAOPERATIVE CHOLANGIOGRAM  04/30/2016   Procedure: INTRAOPERATIVE CHOLANGIOGRAM;  Surgeon: Jules Husbands, MD;  Location: [facility] ORS;  Service: General;;  . KNEE ARTHROSCOPY Left   . LAPAROSCOPY  02/28/2016   Procedure:  Diagnotic laparoscopy with omental biopsy;  Surgeon: Jules Husbands, MD;  Location: [facility] ORS;  Service: General;;  . TONSILLECTOMY      Family History  Problem Relation Age of Onset  . Cancer Father   . Heart attack Father   . Cancer Brother   . Diabetes Brother   . Diabetes Son   . Breast cancer Neg Hx     Social History   Socioeconomic History  . Marital status: Widowed    Spouse name: Not on file  . Number of children: Not on file  . Years of education: Not on file  . Highest education level: Not on file  Occupational History  . Not on file  Tobacco Use  . Smoking status: Never Smoker  . Smokeless tobacco: Never Used  Substance and Sexual Activity  . Alcohol use: No  . Drug use: No  . Sexual activity: Not on file  Other Topics Concern  . Not on file  Social History Narrative  . Not on file   Social Determinants of Health   Financial Resource Strain: Not on file  Food Insecurity: Not on file  Transportation Needs: Not on file  Physical Activity: Not on file  Stress: Not on file  Social Connections: Not on file  Intimate Partner Violence: Not on file     Current Outpatient Medications:  .  acetaminophen (TYLENOL) 500 MG tablet, Take 500 mg by mouth every 6 (six) hours as needed  for mild pain., Disp: , Rfl:  .  aspirin EC 81 MG tablet, Take 81 mg by mouth daily., Disp: , Rfl:  .  calcium carbonate (OS-CAL) 1250 (500 Ca) MG chewable tablet, Chew by mouth., Disp: , Rfl:  .  calcium carbonate (TUMS - DOSED IN MG ELEMENTAL CALCIUM) 500 MG chewable tablet, Chew 2 tablets by mouth daily as needed for indigestion or heartburn., Disp: , Rfl:  .  clotrimazole-betamethasone (LOTRISONE) cream, APP EXT AA BID, Disp: , Rfl:  .  conjugated estrogens (PREMARIN) vaginal cream, Place 1 Applicatorful vaginally 2 (two) times a week. 1 gram vaginally at bedtime twice weekly, Disp: 30 g, Rfl: 3 .  glimepiride (AMARYL) 2 MG tablet, TAKE 1 TABLET BY MOUTH DAILY, Disp: 30 tablet, Rfl:  6 .  glucose blood test strip, Use as instructed, Disp: 100 each, Rfl: 6 .  HYDROcodone-acetaminophen (NORCO/VICODIN) 5-325 MG tablet, Take one tablet at night for pain; may take up to every 6 hours as needed for pain if not working or driving, Disp: , Rfl:  .  losartan (COZAAR) 100 MG tablet, TAKE 1 TABLET BY MOUTH DAILY, Disp: 30 tablet, Rfl: 6 .  losartan-hydrochlorothiazide (HYZAAR) 100-25 MG tablet, Take 1 tablet by mouth daily., Disp: , Rfl:  .  metFORMIN (GLUCOPHAGE) 500 MG tablet, Take 1 tablet by mouth 3 (three) times daily., Disp: , Rfl:  .  metoprolol succinate (TOPROL-XL) 100 MG 24 hr tablet, TAKE 1 TABLET BY MOUTH DAILY, Disp: 30 tablet, Rfl: 3 .  metoprolol succinate (TOPROL-XL) 100 MG 24 hr tablet, TAKE 1 TABLET BY MOUTH DAILY, Disp: 30 tablet, Rfl: 3 .  Multiple Vitamins-Minerals (PRESERVISION AREDS PO), Take 1 capsule by mouth 2 (two) times daily. , Disp: , Rfl:  .  nystatin-triamcinolone ointment (MYCOLOG), Apply 1 application topically 2 (two) times daily., Disp: 30 g, Rfl: 0 .  omeprazole (PRILOSEC) 20 MG capsule, Take by mouth., Disp: , Rfl:  .  ondansetron (ZOFRAN ODT) 4 MG disintegrating tablet, Take 1 tablet (4 mg total) by mouth every 8 (eight) hours as needed for nausea or vomiting., Disp: 20 tablet, Rfl: 0 .  ondansetron (ZOFRAN) 4 MG tablet, Take 1 tablet (4 mg total) by mouth every 8 (eight) hours as needed for nausea or vomiting., Disp: 20 tablet, Rfl: 0 .  ONE TOUCH ULTRA TEST test strip, , Disp: , Rfl: 0 .  pantoprazole (PROTONIX) 40 MG tablet, TAKE 1 TABLET(40 MG) BY MOUTH DAILY, Disp: 30 tablet, Rfl: 6 .  phenazopyridine (PYRIDIUM) 95 MG tablet, Take 1 tablet (95 mg total) by mouth 3 (three) times daily as needed for pain., Disp: 90 tablet, Rfl: 0 .  promethazine (PHENERGAN) 12.5 MG tablet, Take by mouth., Disp: , Rfl:  .  simvastatin (ZOCOR) 40 MG tablet, TAKE 1 TABLET BY MOUTH DAILY, Disp: 90 tablet, Rfl: 3 .  sitaGLIPtin (JANUVIA) 50 MG tablet, Take 25 mg by  mouth daily. , Disp: , Rfl:    Allergies  Allergen Reactions  . Chlorhexidine Itching  . [place] Extract]   . Ramipril Other (See Comments)    IRREGULAR HEART BEAT  . Penicillins Rash    Has patient had a PCN reaction causing immediate rash, facial/tongue/throat swelling, SOB or lightheadedness with hypotension: Yes Has patient had a PCN reaction causing severe rash involving mucus membranes or skin necrosis: Yes Has patient had a PCN reaction that required hospitalization No Has patient had a PCN reaction occurring within the last 10 years: No If all  of the above answers are "NO", then may proceed with Cephalosporin use.   . Sulfa Antibiotics Rash    ROS Review of Systems  Constitutional: Negative.   HENT: Negative.   Respiratory: Negative.   Genitourinary: Negative.   Musculoskeletal: Negative.   Psychiatric/Behavioral: Negative.      OBJECTIVE:    Physical Exam Vitals and nursing note reviewed.  Constitutional:      Appearance: She is obese.  HENT:     Right Ear: Tympanic membrane normal.     Left Ear: Tympanic membrane normal.     Mouth/Throat:     Mouth: Mucous membranes are moist.  Cardiovascular:     Rate and Rhythm: Normal rate and regular rhythm.  Pulmonary:     Effort: Pulmonary effort is normal.  Skin:    General: Skin is warm.  Neurological:     General: No focal deficit present.  Psychiatric:        Mood and Affect: Mood normal.     BP (!) 143/69   Pulse 67   Ht 5\' 9"  (1.753 m)   Wt 266 lb 8 oz (120.9 kg)   BMI 39.36 kg/m  Wt Readings from Last 3 Encounters:  10/12/20 266 lb 8 oz (120.9 kg)  10/06/20 250 lb (113.4 kg)  06/07/20 267 lb 8 oz (121.3 kg)    Health Maintenance Due  Topic Date Due  . FOOT EXAM  Never done  . OPHTHALMOLOGY EXAM  Never done  . DEXA SCAN  Never done  . PNA vac Low Risk Adult (2 of 2 - PCV13) 08/13/2018  . COVID-19 Vaccine (3 - Moderna risk 4-dose series) 09/05/2019  . HEMOGLOBIN A1C   09/26/2020    There are no preventive care reminders to display for this patient.  CBC Latest Ref Rng & Units 10/06/2020 04/05/2020 08/20/2019  WBC 4.0 - 10.5 K/uL 17.1(H) 6.6 10.5  Hemoglobin 12.0 - 15.0 g/dL 14.2 13.9 13.3  Hematocrit 36.0 - 46.0 % 42.6 41.5 40.2  Platelets 150 - 400 K/uL 203 194 186   CMP Latest Ref Rng & Units 10/06/2020 03/29/2020 08/20/2019  Glucose 70 - 99 mg/dL 212(H) 195(H) 160(H)  BUN 8 - 23 mg/dL 25(H) 21 12  Creatinine 0.44 - 1.00 mg/dL 1.02(H) 0.97(H) 0.90  Sodium 135 - 145 mmol/L 138 136 135  Potassium 3.5 - 5.1 mmol/L 4.4 4.5 4.2  Chloride 98 - 111 mmol/L 101 102 102  CO2 22 - 32 mmol/L 25 26 25   Calcium 8.9 - 10.3 mg/dL 8.9 9.5 8.8(L)  Total Protein 6.5 - 8.1 g/dL 7.0 6.7 -  Total Bilirubin 0.3 - 1.2 mg/dL 0.8 0.5 -  Alkaline Phos 38 - 126 U/L 108 - -  AST 15 - 41 U/L 26 16 -  ALT 0 - 44 U/L 17 15 -    Lab Results  Component Value Date   TSH 2.19 04/05/2020   Lab Results  Component Value Date   ALBUMIN 4.0 10/06/2020   ANIONGAP 12 10/06/2020   Lab Results  Component Value Date   CHOL 169 04/05/2020   HDL 53 04/05/2020   LDLCALC 97 04/05/2020   CHOLHDL 3.2 04/05/2020   Lab Results  Component Value Date   TRIG 99 04/05/2020   Lab Results  Component Value Date   HGBA1C 7.3 (H) 03/29/2020   HGBA1C 6.0 01/12/2016      ASSESSMENT & PLAN:   Problem List Items Addressed This Visit      Cardiovascular and Mediastinum  Benign essential HTN    Patient's blood pressure is within the desired range. Medication side effects include: no side effects noted Continue current treatment regimen.         Digestive   Gastroenteritis - Primary    Patient had N/V 5 days ago after Zofran and clear diet she has improved. No NV for 2 days.  Plan- Checking BMP today.         Endocrine   Type 2 diabetes mellitus without complication (HCC)    Diabetes mellitus Type II, under fair control.. Discussed general issues about diabetes pathophysiology  and management.   Plan- A1C today          No orders of the defined types were placed in this encounter.     Follow-up: No follow-ups on file.    Beckie Salts, [place] [address], [place], [place] [address]

## 2020-10-13 LAB — BASIC METABOLIC PANEL WITH GFR
BUN/Creatinine Ratio: 19 (calc) (ref 6–22)
BUN: 20 mg/dL (ref 7–25)
CO2: 28 mmol/L (ref 20–32)
Calcium: 9.2 mg/dL (ref 8.6–10.4)
Chloride: 102 mmol/L (ref 98–110)
Creat: 1.05 mg/dL — ABNORMAL HIGH (ref 0.60–0.88)
GFR, Est African American: 55 mL/min/{1.73_m2} — ABNORMAL LOW (ref >=60)
GFR, Est Non African American: 48 mL/min/{1.73_m2} — ABNORMAL LOW (ref >=60)
Glucose, Bld: 236 mg/dL — ABNORMAL HIGH (ref 65–99)
Potassium: 4.6 mmol/L (ref 3.5–5.3)
Sodium: 138 mmol/L (ref 135–146)

## 2020-10-13 LAB — HEMOGLOBIN A1C
Hgb A1c MFr Bld: 8.4 % of total Hgb — ABNORMAL HIGH (ref <5.7)
Mean Plasma Glucose: 194 mg/dL
eAG (mmol/L): 10.8 mmol/L

## 2020-10-16 ENCOUNTER — Encounter (INDEPENDENT_AMBULATORY_CARE_PROVIDER_SITE_OTHER): Payer: Self-pay | Admitting: Ophthalmology

## 2020-10-16 ENCOUNTER — Other Ambulatory Visit: Payer: Self-pay

## 2020-10-16 ENCOUNTER — Ambulatory Visit (INDEPENDENT_AMBULATORY_CARE_PROVIDER_SITE_OTHER): Payer: Medicare Other | Admitting: Ophthalmology

## 2020-10-16 DIAGNOSIS — H353132 Nonexudative age-related macular degeneration, bilateral, intermediate dry stage: Secondary | ICD-10-CM

## 2020-10-16 DIAGNOSIS — H35033 Hypertensive retinopathy, bilateral: Secondary | ICD-10-CM

## 2020-10-16 DIAGNOSIS — Z961 Presence of intraocular lens: Secondary | ICD-10-CM

## 2020-10-16 DIAGNOSIS — E119 Type 2 diabetes mellitus without complications: Secondary | ICD-10-CM

## 2020-10-16 DIAGNOSIS — H3581 Retinal edema: Secondary | ICD-10-CM | POA: Diagnosis not present

## 2020-10-16 DIAGNOSIS — H18423 Band keratopathy, bilateral: Secondary | ICD-10-CM

## 2020-10-16 DIAGNOSIS — I10 Essential (primary) hypertension: Secondary | ICD-10-CM | POA: Diagnosis not present

## 2020-10-24 ENCOUNTER — Other Ambulatory Visit: Payer: Self-pay

## 2020-10-24 MED ORDER — RYBELSUS 7 MG PO TABS: 7.0000 mg | ORAL_TABLET | Freq: Every day | ORAL | 1 refills | Status: DC

## 2020-11-19 ENCOUNTER — Other Ambulatory Visit: Payer: Self-pay

## 2020-11-19 MED ORDER — GLIMEPIRIDE 4 MG PO TABS: 4.0000 mg | ORAL_TABLET | Freq: Every day | ORAL | 3 refills | Status: DC

## 2021-01-04 ENCOUNTER — Other Ambulatory Visit: Payer: Self-pay | Admitting: Family Medicine

## 2021-01-04 ENCOUNTER — Other Ambulatory Visit: Payer: Self-pay | Admitting: Internal Medicine

## 2021-01-10 ENCOUNTER — Ambulatory Visit: Payer: Medicare Other | Admitting: Family Medicine

## 2021-01-10 NOTE — Progress Notes (Addendum)
[facility] Note  01/16/2021     CHIEF COMPLAINT Patient presents for Retina Follow Up   HISTORY OF PRESENT ILLNESS: Victoria Dalton is a 87 y.o. female who presents to the clinic today for:  HPI     Retina Follow Up   Patient presents with  Dry AMD.  In both eyes.  This started 3 months ago.  I, the attending physician,  performed the HPI with the patient and updated documentation appropriately.        Comments   Patient here for 3 months retina follow up for non exu ARMD OU. Patient states vision was good. First of august OS is swollen and runs water. Did warm compresses and used AT's. Lid wants to come down. No eye pain.       Last edited by Bernarda Caffey, MD on 01/16/2021  9:23 AM.     Pt states her vision has "been good", pt is using AT's for dryness, she states her left eye waters a lot.  States: redness and swollen since the first of Aug. OS. Improved some when using hot compress.   Referring physician: Vevelyn Royals, MD [address] [place], [place] [address]  HISTORICAL INFORMATION:   Selected notes from the MEDICAL RECORD NUMBER Referred by Dr. Lucita Ferrara LEE: 02/22/2020 BCVA: OD 20/70, OS 20/70 Ocular Hx- Mac cyst OU, Cataracts OU, Band Keratopathy OU, ARMD OU PMH- DM   CURRENT MEDICATIONS: No current outpatient medications on file. (Ophthalmic Drugs)   No current facility-administered medications for this visit. (Ophthalmic Drugs)   Current Outpatient Medications (Other)  Medication Sig   acetaminophen (TYLENOL) 500 MG tablet Take 500 mg by mouth every 6 (six) hours as needed for mild pain.   aspirin EC 81 MG tablet Take 81 mg by mouth daily.   calcium carbonate (OS-CAL) 1250 (500 Ca) MG chewable tablet Chew by mouth.   calcium carbonate (TUMS - DOSED IN MG ELEMENTAL CALCIUM) 500 MG chewable tablet Chew 2 tablets by mouth daily as needed for indigestion or heartburn.   clotrimazole-betamethasone (LOTRISONE) cream  APP EXT AA BID   conjugated estrogens (PREMARIN) vaginal cream Place 1 Applicatorful vaginally 2 (two) times a week. 1 gram vaginally at bedtime twice weekly   glimepiride (AMARYL) 4 MG tablet Take 1 tablet (4 mg total) by mouth daily before breakfast.   glucose blood test strip Use as instructed   HYDROcodone-acetaminophen (NORCO/VICODIN) 5-325 MG tablet Take one tablet at night for pain; may take up to every 6 hours as needed for pain if not working or driving (Patient not taking: Reported on 10/16/2020)   losartan (COZAAR) 100 MG tablet TAKE 1 TABLET BY MOUTH DAILY   losartan-hydrochlorothiazide (HYZAAR) 100-25 MG tablet Take 1 tablet by mouth daily.   metFORMIN (GLUCOPHAGE) 500 MG tablet Take 1 tablet by mouth 3 (three) times daily.   metoprolol succinate (TOPROL-XL) 100 MG 24 hr tablet TAKE 1 TABLET BY MOUTH DAILY   metoprolol succinate (TOPROL-XL) 100 MG 24 hr tablet TAKE 1 TABLET BY MOUTH DAILY   Multiple Vitamins-Minerals (PRESERVISION AREDS PO) Take 1 capsule by mouth 2 (two) times daily.    nystatin-triamcinolone ointment (MYCOLOG) Apply 1 application topically 2 (two) times daily.   omeprazole (PRILOSEC) 20 MG capsule Take by mouth.   ondansetron (ZOFRAN ODT) 4 MG disintegrating tablet Take 1 tablet (4 mg total) by mouth every 8 (eight) hours as needed for nausea or vomiting.   ondansetron (ZOFRAN) 4 MG tablet  Take 1 tablet (4 mg total) by mouth every 8 (eight) hours as needed for nausea or vomiting. (Patient not taking: Reported on 10/16/2020)   ONE TOUCH ULTRA TEST test strip    pantoprazole (PROTONIX) 40 MG tablet TAKE 1 TABLET(40 MG) BY MOUTH DAILY   pantoprazole (PROTONIX) 40 MG tablet TAKE 1 TABLET(40 MG) BY MOUTH DAILY   phenazopyridine (PYRIDIUM) 95 MG tablet Take 1 tablet (95 mg total) by mouth 3 (three) times daily as needed for pain.   promethazine (PHENERGAN) 12.5 MG tablet Take by mouth. (Patient not taking: Reported on 10/16/2020)   Semaglutide (RYBELSUS) 7 MG TABS Take 7 mg  by mouth daily.   simvastatin (ZOCOR) 40 MG tablet TAKE 1 TABLET BY MOUTH DAILY   sitaGLIPtin (JANUVIA) 50 MG tablet Take 25 mg by mouth daily.    No current facility-administered medications for this visit. (Other)   REVIEW OF SYSTEMS: ROS   Positive for: Gastrointestinal, Endocrine, Eyes Negative for: Constitutional, Neurological, Skin, Genitourinary, Musculoskeletal, HENT, Cardiovascular, Respiratory, Psychiatric, Allergic/Imm, Heme/Lymph Last edited by Theodore Demark, COA on 01/16/2021  8:08 AM.     ALLERGIES Allergies  Allergen Reactions   Chlorhexidine Itching   Poison Oak Extract [Poison Oak Extract]    Ramipril Other (See Comments)    IRREGULAR HEART BEAT   Penicillins Rash    Has patient had a PCN reaction causing immediate rash, facial/tongue/throat swelling, SOB or lightheadedness with hypotension: Yes Has patient had a PCN reaction causing severe rash involving mucus membranes or skin necrosis: Yes Has patient had a PCN reaction that required hospitalization No Has patient had a PCN reaction occurring within the last 10 years: No If all of the above answers are "NO", then may proceed with Cephalosporin use.    Sulfa Antibiotics Rash    PAST MEDICAL HISTORY Past Medical History:  Diagnosis Date   Diabetes mellitus without complication ([place])    Dyspnea    Gallbladder calculus    GERD (gastroesophageal reflux disease)    Hyperlipidemia    Hypertension    Hypertensive retinopathy    OU   Kidney stone    Macular degeneration    Dry OU   Mass    Parotid swelling    UTI (lower urinary tract infection)    Past Surgical History:  Procedure Laterality Date   CATARACT EXTRACTION Bilateral 02/2020   Dr. Vevelyn Royals   CHOLECYSTECTOMY N/A 04/30/2016   Procedure: CHOLECYSTECTOMY;  Surgeon: Jules Husbands, MD;  Location: [facility] ORS;  Service: General;  Laterality: N/A;   DIAGNOSTIC LAPAROSCOPIC LIVER BIOPSY  02/28/2016   Procedure: DIAGNOSTIC LAPAROSCOPIC LIVER  BIOPSY;  Surgeon: Jules Husbands, MD;  Location: [facility] ORS;  Service: General;;   ERCP N/A 01/13/2016   Procedure: ENDOSCOPIC RETROGRADE CHOLANGIOPANCREATOGRAPHY (ERCP);  Surgeon: Lucilla Lame, MD;  Location: [facility];  Service: Endoscopy;  Laterality: N/A;   ERCP N/A 04/08/2016   Procedure: ENDOSCOPIC RETROGRADE CHOLANGIOPANCREATOGRAPHY (ERCP) Stent removal;  Surgeon: Lucilla Lame, MD;  Location: [facility] ENDOSCOPY;  Service: Endoscopy;  Laterality: N/A;   EYE SURGERY Bilateral 02/2020   Cat Sx - Dr. Vevelyn Royals   INTRAOPERATIVE CHOLANGIOGRAM  04/30/2016   Procedure: INTRAOPERATIVE CHOLANGIOGRAM;  Surgeon: Jules Husbands, MD;  Location: [facility] ORS;  Service: General;;   KNEE ARTHROSCOPY Left    LAPAROSCOPY  02/28/2016   Procedure: Diagnotic laparoscopy with omental biopsy;  Surgeon: Jules Husbands, MD;  Location: [facility] ORS;  Service: General;;   TONSILLECTOMY      FAMILY HISTORY Family  History  Problem Relation Age of Onset   Cancer Father    Heart attack Father    Cancer Brother    Diabetes Brother    Diabetes Son    Breast cancer Neg Hx     SOCIAL HISTORY Social History   Tobacco Use   Smoking status: Never   Smokeless tobacco: Never  Substance Use Topics   Alcohol use: No   Drug use: No         OPHTHALMIC EXAM:  Base Eye Exam     Visual Acuity (Snellen - Linear)       Right Left   Dist cc 20/50 20/60 -2   Dist ph cc 20/40 -2 20/40 -1    Correction: Glasses         Tonometry (Tonopen, 8:05 AM)       Right Left   Pressure 15 12         Pupils       Dark Light Shape React APD   Right 4 3 Round Brisk None   Left 4 3 Round Brisk None         Visual Fields (Counting fingers)       Left Right    Full Full         Extraocular Movement       Right Left    Full Full         Neuro/Psych     Oriented x3: Yes   Mood/Affect: Normal         Dilation     Both eyes: 1.0% Mydriacyl, 2.5% Phenylephrine @ 8:05 AM           Slit Lamp  and Fundus Exam     Slit Lamp Exam       Right Left   Lids/Lashes Dermatochalasis - upper lid Dermatochalasis - upper lid, upper lid swelling   Conjunctiva/Sclera Conjunctivochalasis Conjunctivochalasis inferior    Cornea early band K nasal and temporal, 2+ PEE centrally, arcus, well healed temporal cataract wounds early band K nasal and temporal, 2+PEE, arcus, well healed temporal cataract wounds   Anterior Chamber Deep and quiet Deep and quiet   Iris Round and dilated Round and dilated   Lens PC IOL in good position, 1+ Posterior capsular opacification PC IOL in good position, trace Posterior capsular opacification   Vitreous Vitreous syneresis, Posterior vitreous detachment Vitreous syneresis, Posterior vitreous detachment         Fundus Exam       Right Left   Disc mild pallor, Sharp rim, +Peripapillary atrophy Pink and Sharp, temporal Peripapillary atrophy   C/D Ratio 0.5 0.4   Macula Flat, Blunted foveal reflex, RPE mottling, clumping and atrophy centrally, No heme or edema Flat, Blunted foveal reflex, RPE mottling, clumping and atrophy centrally, No heme or edema   Vessels attenuated, mild tortuousity attenuated, mild tortuousity   Periphery Attached, reticular degeneration, No heme  Attached, reticular degeneration, No heme            Refraction     Wearing Rx       Sphere Cylinder Axis Add   Right -1.00 +1.25 168 +3.00   Left -1.00 +1.00 180 +3.00            IMAGING AND PROCEDURES  Imaging and Procedures for 01/16/2021  OCT, Retina - OU - Both Eyes       Right Eye Quality was good. Central Foveal Thickness: 244. Progression has been stable. Findings include normal foveal  contour, no IRF, subretinal hyper-reflective material, retinal drusen (persistent focal central SRHM).   Left Eye Quality was good. Central Foveal Thickness: 249. Progression has been stable. Findings include normal foveal contour, subretinal hyper-reflective material, subretinal fluid,  no IRF (Persistent focal central SRF / SRHM).   Notes *Images captured and stored on drive  Diagnosis / Impression:  NFP, no IRF, +SRF OU OD: persistent focal central SRHM OS: Persistent in focal central SRF / SRHM  Clinical management:  See below  Abbreviations: NFP - Normal foveal profile. CME - cystoid macular edema. PED - pigment epithelial detachment. IRF - intraretinal fluid. SRF - subretinal fluid. EZ - ellipsoid zone. ERM - epiretinal membrane. ORA - outer retinal atrophy. ORT - outer retinal tubulation. SRHM - subretinal hyper-reflective material. IRHM - intraretinal hyper-reflective material            ASSESSMENT/PLAN:    ICD-10-CM   1. Intermediate stage nonexudative age-related macular degeneration of both eyes  H35.3132     2. Retinal edema  H35.81 OCT, Retina - OU - Both Eyes    3. Diabetes mellitus type 2 without retinopathy ([place])  E11.9     4. Essential hypertension  I10     5. Hypertensive retinopathy of both eyes  H35.033     6. Pseudophakia of both eyes  Z96.1     7. Band keratopathy of both eyes  H18.423      1,2. Age related macular degeneration, non-exudative, both eyes  - The incidence, anatomy, and pathology of dry AMD, risk of progression, and the AREDS and AREDS 2 studies including smoking risks discussed with patient.  - OCT shows OD: persistent focal central SRHM; OS: Persistent in focal central SRF / SRHM  - Recommend amsler grid monitoring  - f/u 4 months-- DFE, OCT  3. Diabetes mellitus, type 2 without retinopathy - The incidence, risk factors for progression, natural history and treatment options for diabetic retinopathy  were discussed with patient.   - The need for close monitoring of blood glucose, blood pressure, and serum lipids, avoiding cigarette or any type of tobacco, and the need for long term follow up was also discussed with patient. - f/u in 1 year, sooner prn  4,5. Hypertensive retinopathy OU - discussed importance of  tight BP control - monitor  6. Pseudophakia OU  - s/p CE/IOL w/Dr. Lucita Ferrara [phone]  - IOL in good position, doing well - monitor  7. Band keratopathy OU  Ophthalmic Meds Ordered this visit:  No orders of the defined types were placed in this encounter.      Return in 4 months (on 05/18/2021) for non ex ARMD OU -  DFE, OCT.  There are no Patient Instructions on file for this visit.  This document serves as a record of services personally performed by Gardiner Sleeper, MD, PhD. It was created on their behalf by Orvan Falconer, an ophthalmic technician. The creation of this record is the provider's dictation and/or activities during the visit.    Electronically signed by: Orvan Falconer, OA, 01/22/21  2:32 PM   This document serves as a record of services personally performed by Gardiner Sleeper, MD, PhD. It was created on their behalf by Leonie Douglas, an ophthalmic technician. The creation of this record is the provider's dictation and/or activities during the visit.    Electronically signed by: Leonie Douglas COA, 01/22/21  2:32 PM  Gardiner Sleeper, M.D., Ph.D. Diseases & Surgery of the Retina and Vitreous [facility] 01/16/2021   I have reviewed the above documentation for accuracy and completeness, and I agree with the above. Gardiner Sleeper, M.D., Ph.D. 01/22/21 2:32 PM   Abbreviations: M myopia (nearsighted); A astigmatism; H hyperopia (farsighted); P presbyopia; Mrx spectacle prescription;  CTL contact lenses; OD right eye; OS left eye; OU both eyes  XT exotropia; ET esotropia; PEK punctate epithelial keratitis; PEE punctate epithelial erosions; DES dry eye syndrome; MGD meibomian gland dysfunction; ATs artificial tears; PFAT's preservative free artificial tears; [place] nuclear sclerotic cataract; PSC posterior subcapsular cataract; ERM epi-retinal membrane; PVD posterior vitreous detachment; RD retinal detachment; DM diabetes mellitus; DR diabetic  retinopathy; NPDR non-proliferative diabetic retinopathy; PDR proliferative diabetic retinopathy; CSME clinically significant macular edema; DME diabetic macular edema; dbh dot blot hemorrhages; CWS cotton wool spot; POAG primary open angle glaucoma; C/D cup-to-disc ratio; HVF humphrey visual field; GVF goldmann visual field; OCT optical coherence tomography; IOP intraocular pressure; BRVO Branch retinal vein occlusion; CRVO central retinal vein occlusion; CRAO central retinal artery occlusion; BRAO branch retinal artery occlusion; RT retinal tear; SB scleral buckle; PPV pars plana vitrectomy; VH Vitreous hemorrhage; PRP panretinal laser photocoagulation; IVK intravitreal kenalog; VMT vitreomacular traction; MH Macular hole;  NVD neovascularization of the disc; NVE neovascularization elsewhere; AREDS age related eye disease study; ARMD age related macular degeneration; POAG primary open angle glaucoma; EBMD epithelial/anterior basement membrane dystrophy; ACIOL anterior chamber intraocular lens; IOL intraocular lens; PCIOL posterior chamber intraocular lens; Phaco/IOL phacoemulsification with intraocular lens placement; [place] photorefractive keratectomy; LASIK laser assisted in situ keratomileusis; HTN hypertension; DM diabetes mellitus; COPD chronic obstructive pulmonary disease

## 2021-01-16 ENCOUNTER — Ambulatory Visit (INDEPENDENT_AMBULATORY_CARE_PROVIDER_SITE_OTHER): Payer: Medicare Other | Admitting: Ophthalmology

## 2021-01-16 ENCOUNTER — Encounter (INDEPENDENT_AMBULATORY_CARE_PROVIDER_SITE_OTHER): Payer: Self-pay | Admitting: Ophthalmology

## 2021-01-16 ENCOUNTER — Other Ambulatory Visit: Payer: Self-pay

## 2021-01-16 DIAGNOSIS — H3581 Retinal edema: Secondary | ICD-10-CM | POA: Diagnosis not present

## 2021-01-16 DIAGNOSIS — H35033 Hypertensive retinopathy, bilateral: Secondary | ICD-10-CM | POA: Diagnosis not present

## 2021-01-16 DIAGNOSIS — H18423 Band keratopathy, bilateral: Secondary | ICD-10-CM

## 2021-01-16 DIAGNOSIS — I10 Essential (primary) hypertension: Secondary | ICD-10-CM

## 2021-01-16 DIAGNOSIS — H353132 Nonexudative age-related macular degeneration, bilateral, intermediate dry stage: Secondary | ICD-10-CM | POA: Diagnosis not present

## 2021-01-16 DIAGNOSIS — E119 Type 2 diabetes mellitus without complications: Secondary | ICD-10-CM

## 2021-01-16 DIAGNOSIS — Z961 Presence of intraocular lens: Secondary | ICD-10-CM | POA: Diagnosis not present

## 2021-01-16 DIAGNOSIS — H25813 Combined forms of age-related cataract, bilateral: Secondary | ICD-10-CM

## 2021-01-16 DIAGNOSIS — H02846 Edema of left eye, unspecified eyelid: Secondary | ICD-10-CM

## 2021-01-16 LAB — HM DIABETES EYE EXAM

## 2021-01-26 ENCOUNTER — Other Ambulatory Visit: Payer: Self-pay | Admitting: Internal Medicine

## 2021-01-30 ENCOUNTER — Encounter: Payer: Self-pay | Admitting: Internal Medicine

## 2021-01-30 ENCOUNTER — Ambulatory Visit (INDEPENDENT_AMBULATORY_CARE_PROVIDER_SITE_OTHER): Payer: Medicare Other | Admitting: Internal Medicine

## 2021-01-30 VITALS — BP 177/85 | HR 57 | Ht 69.0 in | Wt 250.2 lb

## 2021-01-30 DIAGNOSIS — I10 Essential (primary) hypertension: Secondary | ICD-10-CM | POA: Diagnosis not present

## 2021-01-30 DIAGNOSIS — Z23 Encounter for immunization: Secondary | ICD-10-CM

## 2021-01-30 DIAGNOSIS — E119 Type 2 diabetes mellitus without complications: Secondary | ICD-10-CM

## 2021-01-30 DIAGNOSIS — K219 Gastro-esophageal reflux disease without esophagitis: Secondary | ICD-10-CM

## 2021-01-30 LAB — GLUCOSE, POCT (MANUAL RESULT ENTRY): POC Glucose: 197 mg/dl — AB (ref 70–99)

## 2021-01-30 NOTE — Assessment & Plan Note (Signed)

## 2021-01-30 NOTE — Assessment & Plan Note (Signed)
-   The patient's GERD is stable on medication.  - Instructed the patient to avoid eating spicy and acidic foods, as well as foods high in fat. - Instructed the patient to avoid eating large meals or meals 2-3 hours prior to sleeping. 

## 2021-01-30 NOTE — Assessment & Plan Note (Signed)

## 2021-01-30 NOTE — Progress Notes (Signed)
Established Patient Office Visit  Subjective:  Patient ID: Victoria Dalton, female    DOB: 09-Mar-1931  Age: 87 y.o. MRN: [id number]  CC:  Chief Complaint  Patient presents with   Diabetes    Follow up    Diabetes She presents for her follow-up diabetic visit. She has type 2 diabetes mellitus. Her disease course has been fluctuating. Associated symptoms include visual change. Pertinent negatives for diabetes include no blurred vision, no chest pain, no fatigue, no foot paresthesias, no foot ulcerations, no polydipsia, no polyphagia, no polyuria, no weakness and no weight loss. Symptoms are stable. Risk factors for coronary artery disease include diabetes mellitus and dyslipidemia. Her home blood glucose trend is fluctuating dramatically.   Victoria Dalton presents for general check up  Past Medical History:  Diagnosis Date   Diabetes mellitus without complication ([place])    Dyspnea    Gallbladder calculus    GERD (gastroesophageal reflux disease)    Hyperlipidemia    Hypertension    Hypertensive retinopathy    OU   Kidney stone    Macular degeneration    Dry OU   Mass    Parotid swelling    UTI (lower urinary tract infection)     Past Surgical History:  Procedure Laterality Date   CATARACT EXTRACTION Bilateral 02/2020   Dr. Vevelyn Royals   CHOLECYSTECTOMY N/A 04/30/2016   Procedure: CHOLECYSTECTOMY;  Surgeon: Jules Husbands, MD;  Location: [facility] ORS;  Service: General;  Laterality: N/A;   DIAGNOSTIC LAPAROSCOPIC LIVER BIOPSY  02/28/2016   Procedure: DIAGNOSTIC LAPAROSCOPIC LIVER BIOPSY;  Surgeon: Jules Husbands, MD;  Location: [facility] ORS;  Service: General;;   ERCP N/A 01/13/2016   Procedure: ENDOSCOPIC RETROGRADE CHOLANGIOPANCREATOGRAPHY (ERCP);  Surgeon: Lucilla Lame, MD;  Location: [facility] ENDOSCOPY;  Service: Endoscopy;  Laterality: N/A;   ERCP N/A 04/08/2016   Procedure: ENDOSCOPIC RETROGRADE CHOLANGIOPANCREATOGRAPHY (ERCP) Stent removal;  Surgeon: Lucilla Lame, MD;   Location: [facility] ENDOSCOPY;  Service: Endoscopy;  Laterality: N/A;   EYE SURGERY Bilateral 02/2020   Cat Sx - Dr. Vevelyn Royals   INTRAOPERATIVE CHOLANGIOGRAM  04/30/2016   Procedure: INTRAOPERATIVE CHOLANGIOGRAM;  Surgeon: Jules Husbands, MD;  Location: [facility] ORS;  Service: General;;   KNEE ARTHROSCOPY Left    LAPAROSCOPY  02/28/2016   Procedure: Diagnotic laparoscopy with omental biopsy;  Surgeon: Jules Husbands, MD;  Location: [facility] ORS;  Service: General;;   TONSILLECTOMY      Family History  Problem Relation Age of Onset   Cancer Father    Heart attack Father    Cancer Brother    Diabetes Brother    Diabetes Son    Breast cancer Neg Hx     Social History   Socioeconomic History   Marital status: Widowed    Spouse name: Not on file   Number of children: Not on file   Years of education: Not on file   Highest education level: Not on file  Occupational History   Not on file  Tobacco Use   Smoking status: Never   Smokeless tobacco: Never  Substance and Sexual Activity   Alcohol use: No   Drug use: No   Sexual activity: Not on file  Other Topics Concern   Not on file  Social History Narrative   Not on file   Social Determinants of Health   Financial Resource Strain: Not on file  Food Insecurity: Not on file  Transportation Needs: Not on file  Physical Activity: Not on file  Stress: Not on file  Social Connections: Not on file  Intimate Partner Violence: Not on file     Current Outpatient Medications:    acetaminophen (TYLENOL) 500 MG tablet, Take 500 mg by mouth every 6 (six) hours as needed for mild pain., Disp: , Rfl:    aspirin EC 81 MG tablet, Take 81 mg by mouth daily., Disp: , Rfl:    calcium carbonate (OS-CAL) 1250 (500 Ca) MG chewable tablet, Chew by mouth., Disp: , Rfl:    calcium carbonate (TUMS - DOSED IN MG ELEMENTAL CALCIUM) 500 MG chewable tablet, Chew 2 tablets by mouth daily as needed for indigestion or heartburn., Disp: , Rfl:     clotrimazole-betamethasone (LOTRISONE) cream, APP EXT AA BID, Disp: , Rfl:    conjugated estrogens (PREMARIN) vaginal cream, Place 1 Applicatorful vaginally 2 (two) times a week. 1 gram vaginally at bedtime twice weekly, Disp: 30 g, Rfl: 3   glimepiride (AMARYL) 4 MG tablet, Take 1 tablet (4 mg total) by mouth daily before breakfast., Disp: 30 tablet, Rfl: 3   losartan (COZAAR) 100 MG tablet, TAKE 1 TABLET BY MOUTH DAILY, Disp: 30 tablet, Rfl: 6   losartan-hydrochlorothiazide (HYZAAR) 100-25 MG tablet, Take 1 tablet by mouth daily., Disp: , Rfl:    metFORMIN (GLUCOPHAGE) 500 MG tablet, Take 1 tablet by mouth 3 (three) times daily., Disp: , Rfl:    metoprolol succinate (TOPROL-XL) 100 MG 24 hr tablet, TAKE 1 TABLET BY MOUTH DAILY, Disp: 30 tablet, Rfl: 3   metoprolol succinate (TOPROL-XL) 100 MG 24 hr tablet, TAKE 1 TABLET BY MOUTH DAILY, Disp: 30 tablet, Rfl: 3   Multiple Vitamins-Minerals (PRESERVISION AREDS PO), Take 1 capsule by mouth 2 (two) times daily. , Disp: , Rfl:    nystatin-triamcinolone ointment (MYCOLOG), Apply 1 application topically 2 (two) times daily., Disp: 30 g, Rfl: 0   omeprazole (PRILOSEC) 20 MG capsule, Take by mouth., Disp: , Rfl:    ondansetron (ZOFRAN ODT) 4 MG disintegrating tablet, Take 1 tablet (4 mg total) by mouth every 8 (eight) hours as needed for nausea or vomiting., Disp: 20 tablet, Rfl: 0   ONE TOUCH ULTRA TEST test strip, , Disp: , Rfl: 0   ONETOUCH ULTRA test strip, USE AS DIRECTED TO TEST BLOOD SUGAR, Disp: 100 strip, Rfl: 6   pantoprazole (PROTONIX) 40 MG tablet, TAKE 1 TABLET(40 MG) BY MOUTH DAILY, Disp: 30 tablet, Rfl: 6   pantoprazole (PROTONIX) 40 MG tablet, TAKE 1 TABLET(40 MG) BY MOUTH DAILY, Disp: 30 tablet, Rfl: 6   phenazopyridine (PYRIDIUM) 95 MG tablet, Take 1 tablet (95 mg total) by mouth 3 (three) times daily as needed for pain., Disp: 90 tablet, Rfl: 0   simvastatin (ZOCOR) 40 MG tablet, TAKE 1 TABLET BY MOUTH DAILY, Disp: 90 tablet, Rfl: 3    sitaGLIPtin (JANUVIA) 50 MG tablet, Take 25 mg by mouth daily. , Disp: , Rfl:    Allergies  Allergen Reactions   Chlorhexidine Itching   Poison Oak Extract [Poison Oak Extract]    Ramipril Other (See Comments)    IRREGULAR HEART BEAT   Penicillins Rash    Has patient had a PCN reaction causing immediate rash, facial/tongue/throat swelling, SOB or lightheadedness with hypotension: Yes Has patient had a PCN reaction causing severe rash involving mucus membranes or skin necrosis: Yes Has patient had a PCN reaction that required hospitalization No Has patient had a PCN reaction occurring within the last 10 years: No If all of the above answers are "NO", then  may proceed with Cephalosporin use.    Sulfa Antibiotics Rash    ROS Review of Systems  Constitutional: Negative.  Negative for fatigue and weight loss.  HENT: Negative.    Eyes: Negative.  Negative for blurred vision.  Respiratory: Negative.    Cardiovascular: Negative.  Negative for chest pain.  Gastrointestinal: Negative.   Endocrine: Negative.  Negative for polydipsia, polyphagia and polyuria.  Genitourinary: Negative.   Musculoskeletal: Negative.   Skin: Negative.   Allergic/Immunologic: Negative.   Neurological: Negative.  Negative for weakness.  Hematological: Negative.   Psychiatric/Behavioral: Negative.    All other systems reviewed and are negative.    Objective:    Physical Exam Vitals reviewed.  Constitutional:      Appearance: Normal appearance.  HENT:     Mouth/Throat:     Mouth: Mucous membranes are moist.  Eyes:     Pupils: Pupils are equal, round, and reactive to light.  Neck:     Vascular: No carotid bruit.  Cardiovascular:     Rate and Rhythm: Normal rate and regular rhythm.     Pulses: Normal pulses.     Heart sounds: Normal heart sounds.  Pulmonary:     Effort: Pulmonary effort is normal.     Breath sounds: Normal breath sounds.  Abdominal:     General: Bowel sounds are normal.      Palpations: Abdomen is soft. There is no hepatomegaly, splenomegaly or mass.     Tenderness: There is no abdominal tenderness.     Hernia: No hernia is present.  Musculoskeletal:        General: No tenderness.     Cervical back: Neck supple.     Right lower leg: No edema.     Left lower leg: No edema.  Skin:    Findings: No rash.  Neurological:     Mental Status: She is alert and oriented to person, place, and time.     Motor: No weakness.  Psychiatric:        Mood and Affect: Mood and affect normal.        Behavior: Behavior normal.    BP (!) 177/85   Pulse (!) 57   Ht '5\' 9"'$  (1.753 m)   Wt 250 lb 3.2 oz (113.5 kg)   BMI 36.95 kg/m  Wt Readings from Last 3 Encounters:  01/30/21 250 lb 3.2 oz (113.5 kg)  10/12/20 266 lb 8 oz (120.9 kg)  10/06/20 250 lb (113.4 kg)     Health Maintenance Due  Topic Date Due   FOOT EXAM  Never done   DEXA SCAN  Never done   Zoster Vaccines- Shingrix (2 of 2) 12/25/2008   PNA vac Low Risk Adult (2 of 2 - PCV13) 08/13/2018   COVID-19 Vaccine (3 - Moderna risk series) 09/05/2019    There are no preventive care reminders to display for this patient.  Lab Results  Component Value Date   TSH 2.19 04/05/2020   Lab Results  Component Value Date   WBC 17.1 (H) 10/06/2020   HGB 14.2 10/06/2020   HCT 42.6 10/06/2020   MCV 92.2 10/06/2020   PLT 203 10/06/2020   Lab Results  Component Value Date   NA 138 10/12/2020   K 4.6 10/12/2020   CO2 28 10/12/2020   GLUCOSE 236 (H) 10/12/2020   BUN 20 10/12/2020   CREATININE 1.05 (H) 10/12/2020   BILITOT 0.8 10/06/2020   ALKPHOS 108 10/06/2020   AST 26 10/06/2020   ALT 17  10/06/2020   PROT 7.0 10/06/2020   ALBUMIN 4.0 10/06/2020   CALCIUM 9.2 10/12/2020   ANIONGAP 12 10/06/2020   Lab Results  Component Value Date   CHOL 169 04/05/2020   Lab Results  Component Value Date   HDL 53 04/05/2020   Lab Results  Component Value Date   LDLCALC 97 04/05/2020   Lab Results  Component  Value Date   TRIG 99 04/05/2020   Lab Results  Component Value Date   CHOLHDL 3.2 04/05/2020   Lab Results  Component Value Date   HGBA1C 8.4 (H) 10/12/2020      Assessment & Plan:   Problem List Items Addressed This Visit       Cardiovascular and Mediastinum   Benign essential HTN     Patient denies any chest pain or shortness of breath there is no history of palpitation or paroxysmal nocturnal dyspnea   patient was advised to follow low-salt low-cholesterol diet    ideally I want to keep systolic blood pressure below 130 mmHg, patient was asked to check blood pressure one times a week and give me a report on that.  Patient will be follow-up in 3 months  or earlier as needed, patient will call me back for any change in the cardiovascular symptoms Patient was advised to buy a book from local bookstore concerning blood pressure and read several chapters  every day.  This will be supplemented by some of the material we will give him from the office.  Patient should also utilize other resources like YouTube and Internet to learn more about the blood pressure and the diet.        Digestive   Gastro-esophageal reflux disease without esophagitis    - The patient's GERD is stable on medication.  - Instructed the patient to avoid eating spicy and acidic foods, as well as foods high in fat. - Instructed the patient to avoid eating large meals or meals 2-3 hours prior to sleeping.        Endocrine   Type 2 diabetes mellitus without complication ([place]) - Primary    - The patient's blood sugar is labile on med. - The patient will continue the current treatment regimen.  - I encouraged the patient to regularly check blood sugar.  - I encouraged the patient to monitor diet. I encouraged the patient to eat low-carb and low-sugar to help prevent blood sugar spikes.  - I encouraged the patient to continue following their prescribed treatment plan for diabetes - I informed the patient to get  help if blood sugar drops below '54mg'$ /dL, or if suddenly have trouble thinking clearly or breathing.  Patient was advised to buy a book on diabetes from a local bookstore or from [place]  Patient should read 2 chapters every day to keep the motivation going, this is in addition to some of the materials we provided them from the office.  There are other resources on the Internet like YouTube and wilkipedia to get an education on the diabetes      Relevant Orders   POCT glucose (manual entry) (Completed)     Other   Class 3 severe obesity due to excess calories in adult [facility])    - I encouraged the patient to lose weight.  - I educated them on making healthy dietary choices including eating more fruits and vegetables and less fried foods. - I encouraged the patient to exercise more, and educated on the benefits of exercise including weight loss, diabetes  prevention, and hypertension prevention.   Dietary counseling with a registered dietician  Referral to a weight management support group (e.g. Weight Watchers, Overeaters Anonymous)  If your BMI is greater than 29 or you have gained more than 15 pounds you should work on weight loss.  Attend a healthy cooking class       Need for influenza vaccination    Suggest flu shot      Relevant Orders   Flu Vaccine QUAD High Dose(Fluad) (Completed)    No orders of the defined types were placed in this encounter.   Follow-up: No follow-ups on file.    Cletis Athens, MD

## 2021-01-30 NOTE — Assessment & Plan Note (Signed)
Suggest flu shot 

## 2021-01-30 NOTE — Assessment & Plan Note (Signed)

## 2021-02-07 ENCOUNTER — Ambulatory Visit
Admission: EM | Admit: 2021-02-07 | Discharge: 2021-02-07 | Disposition: A | Discharge status: Home / Self Care | Payer: Medicare Other | Attending: Emergency Medicine | Admitting: Emergency Medicine

## 2021-02-07 ENCOUNTER — Other Ambulatory Visit: Payer: Self-pay

## 2021-02-07 DIAGNOSIS — R300 Dysuria: Secondary | ICD-10-CM

## 2021-02-07 DIAGNOSIS — R399 Unspecified symptoms and signs involving the genitourinary system: Secondary | ICD-10-CM

## 2021-02-07 LAB — POCT URINALYSIS DIP (MANUAL ENTRY)
Bilirubin, UA: NEGATIVE
Blood, UA: NEGATIVE
Glucose, UA: NEGATIVE mg/dL
Ketones, POC UA: NEGATIVE mg/dL
Leukocytes, UA: NEGATIVE
Nitrite, UA: NEGATIVE
Protein Ur, POC: NEGATIVE mg/dL
Spec Grav, UA: <=1.005 — AB (ref 1.010–1.025)
Urobilinogen, UA: 0.2 E.U./dL
pH, UA: 6.5 (ref 5.0–8.0)

## 2021-02-07 NOTE — ED Triage Notes (Signed)
Pt reports pain with urination since Sunday, reports "its pain like twisting on the tip of it". Also starting to have back pain and left flank pain since "a little bit ago". Monday noted "dark urine", but has increased her water intake, which urine is now normal in color. Has not noticed in blood in urine.  Pt taking OTC AZO, and inserted a "premarin cream last night".   Pt reports last UTI a year ago.

## 2021-02-07 NOTE — Discharge Instructions (Addendum)
Your urine does not show signs of infection today.  Follow-up with your primary care provider or a urologist if your symptoms are not improving.

## 2021-02-07 NOTE — ED Provider Notes (Signed)
Victoria Dalton    CSN: [id number] Arrival date & time: 02/07/21  1759      History   Chief Complaint Chief Complaint  Patient presents with   Urination Symptoms    HPI Victoria Dalton is a 87 y.o. female.  Accompanied by her daughter, patient presents with dysuria x5 days.  She also reports left lower back pain earlier today.  Treatment at home with Azo bacterial prevention and Premarin cream.  She denies fever, chills, hematuria, abdominal pain, or other symptoms.  Her medical history includes hypertension and diabetes.  The history is provided by the patient and medical records.   Past Medical History:  Diagnosis Date   Diabetes mellitus without complication ([place])    Dyspnea    Gallbladder calculus    GERD (gastroesophageal reflux disease)    Hyperlipidemia    Hypertension    Hypertensive retinopathy    OU   Kidney stone    Macular degeneration    Dry OU   Mass    Parotid swelling    UTI (lower urinary tract infection)     Patient Active Problem List   Diagnosis Date Noted   Gastroenteritis 10/12/2020   Candidiasis of breast 05/31/2020   Need for influenza vaccination 03/29/2020   Class 3 severe obesity due to excess calories in adult [facility]) 11/16/2019   Cystitis 11/16/2019   Vaginitis 10/25/2019   Blood in stool    Gallbladder mass    Cholangitis    Cholecystitis    Right upper quadrant pain    Hyperbilirubinemia    Protein-calorie malnutrition, severe 01/11/2016   Cholecystitis with cholelithiasis 01/10/2016   Benign essential HTN 12/06/2015   Dyslipidemia 05/23/2015   Gastro-esophageal reflux disease without esophagitis 05/08/2014   Controlled type 2 diabetes mellitus without complication ([place]) [id number]   Type 2 diabetes mellitus without complication ([place]) [id number]    Past Surgical History:  Procedure Laterality Date   CATARACT EXTRACTION Bilateral 02/2020   Dr. Vevelyn Royals   CHOLECYSTECTOMY N/A 04/30/2016   Procedure:  CHOLECYSTECTOMY;  Surgeon: Jules Husbands, MD;  Location: [facility] ORS;  Service: General;  Laterality: N/A;   DIAGNOSTIC LAPAROSCOPIC LIVER BIOPSY  02/28/2016   Procedure: DIAGNOSTIC LAPAROSCOPIC LIVER BIOPSY;  Surgeon: Jules Husbands, MD;  Location: [facility] ORS;  Service: General;;   ERCP N/A 01/13/2016   Procedure: ENDOSCOPIC RETROGRADE CHOLANGIOPANCREATOGRAPHY (ERCP);  Surgeon: Lucilla Lame, MD;  Location[facility] ENDOSCOPY;  Service: Endoscopy;  Laterality: N/A;   ERCP N/A 04/08/2016   Procedure: ENDOSCOPIC RETROGRADE CHOLANGIOPANCREATOGRAPHY (ERCP) Stent removal;  Surgeon: Lucilla Lame, MD;  Location: [facility] ENDOSCOPY;  Service: Endoscopy;  Laterality: N/A;   EYE SURGERY Bilateral 02/2020   Cat Sx - Dr. Vevelyn Royals   INTRAOPERATIVE CHOLANGIOGRAM  04/30/2016   Procedure: INTRAOPERATIVE CHOLANGIOGRAM;  Surgeon: Jules Husbands, MD;  Location: [facility] ORS;  Service: General;;   KNEE ARTHROSCOPY Left    LAPAROSCOPY  02/28/2016   Procedure: Diagnotic laparoscopy with omental biopsy;  Surgeon: Jules Husbands, MD;  Location: [facility] ORS;  Service: General;;   TONSILLECTOMY      OB History   No obstetric history on file.      Home Medications    Prior to Admission medications   Medication Sig Start Date End Date Taking? Authorizing Provider  acetaminophen (TYLENOL) 500 MG tablet Take 500 mg by mouth every 6 (six) hours as needed for mild pain.   Yes [provider]  aspirin EC 81 MG tablet Take 81 mg by mouth  daily.   Yes [provider]  calcium carbonate (OS-CAL) 1250 (500 Ca) MG chewable tablet Chew by mouth.   Yes [provider]  calcium carbonate (TUMS - DOSED IN MG ELEMENTAL CALCIUM) 500 MG chewable tablet Chew 2 tablets by mouth daily as needed for indigestion or heartburn.   Yes [provider]  carboxymethylcellulose (REFRESH PLUS) 0.5 % SOLN 1 drop 3 (three) times daily as needed.   Yes [provider]  clotrimazole-betamethasone (LOTRISONE) cream APP EXT  AA BID 11/11/17  Yes [provider]  conjugated estrogens (PREMARIN) vaginal cream Place 1 Applicatorful vaginally 2 (two) times a week. 1 gram vaginally at bedtime twice weekly 12/01/19  Yes Will Bonnet, MD  glimepiride (AMARYL) 4 MG tablet Take 1 tablet (4 mg total) by mouth daily before breakfast. 11/19/20  Yes Masoud, Viann Shove, MD  losartan (COZAAR) 100 MG tablet TAKE 1 TABLET BY MOUTH DAILY 01/04/21  Yes Masoud, Viann Shove, MD  metoprolol succinate (TOPROL-XL) 100 MG 24 hr tablet TAKE 1 TABLET BY MOUTH DAILY 11/23/19  Yes Masoud, Viann Shove, MD  metoprolol succinate (TOPROL-XL) 100 MG 24 hr tablet TAKE 1 TABLET BY MOUTH DAILY 01/04/21  Yes Cletis Athens, MD  Multiple Vitamins-Minerals (PRESERVISION AREDS 2 PO) Take by mouth.   Yes [provider]  nystatin-triamcinolone ointment (MYCOLOG) Apply 1 application topically 2 (two) times daily. 05/31/20  Yes Beckie Salts, FNP  omeprazole (PRILOSEC) 20 MG capsule Take by mouth. 05/08/14  Yes [provider]  ondansetron (ZOFRAN ODT) 4 MG disintegrating tablet Take 1 tablet (4 mg total) by mouth every 8 (eight) hours as needed for nausea or vomiting. 10/07/20  Yes Paulette Blanch, MD  ONE TOUCH ULTRA TEST test strip  11/02/15  Yes [provider]  [facility] ULTRA test strip USE AS DIRECTED TO TEST BLOOD SUGAR 01/29/21  Yes Cletis Athens, MD  phenazopyridine (PYRIDIUM) 95 MG tablet Take 1 tablet (95 mg total) by mouth 3 (three) times daily as needed for pain. 11/03/19  Yes Masoud, Viann Shove, MD  Phenazopyridine HCl (AZO TABS PO) Take by mouth.   Yes [provider]  simvastatin (ZOCOR) 40 MG tablet TAKE 1 TABLET BY MOUTH DAILY 07/20/20  Yes Beckie Salts, FNP  losartan-hydrochlorothiazide (HYZAAR) 100-25 MG tablet Take 1 tablet by mouth daily. 04/24/15   [provider]  metFORMIN (GLUCOPHAGE) 500 MG tablet Take 1 tablet by mouth 3 (three) times daily. 11/02/15   [provider]  Multiple Vitamins-Minerals  (PRESERVISION AREDS PO) Take 1 capsule by mouth 2 (two) times daily.     [provider]  pantoprazole (PROTONIX) 40 MG tablet TAKE 1 TABLET(40 MG) BY MOUTH DAILY 01/04/21   Cletis Athens, MD  pantoprazole (PROTONIX) 40 MG tablet TAKE 1 TABLET(40 MG) BY MOUTH DAILY 01/04/21   Cletis Athens, MD  sitaGLIPtin (JANUVIA) 50 MG tablet Take 25 mg by mouth daily.     [provider]    Family History Family History  Problem Relation Age of Onset   Cancer Father    Heart attack Father    Cancer Brother    Diabetes Brother    Diabetes Son    Breast cancer Neg Hx     Social History Social History   Tobacco Use   Smoking status: Never   Smokeless tobacco: Never  Vaping Use   Vaping Use: Never used  Substance Use Topics   Alcohol use: No   Drug use: No     Allergies   Chlorhexidine, Poison oak  extract [poison oak extract], Ramipril, Penicillins, and Sulfa antibiotics   Review of Systems Review of Systems  Constitutional:  Negative for chills and fever.  Respiratory:  Negative for cough and shortness of breath.   Cardiovascular:  Negative for chest pain and palpitations.  Gastrointestinal:  Negative for abdominal pain and vomiting.  Genitourinary:  Positive for dysuria. Negative for flank pain, hematuria and pelvic pain.  Musculoskeletal:  Positive for back pain. Negative for arthralgias.  Skin:  Negative for color change and rash.  All other systems reviewed and are negative.   Physical Exam Triage Vital Signs ED Triage Vitals  Enc Vitals Group     BP      Pulse      Resp      Temp      Temp src      SpO2      Weight      Height      Head Circumference      Peak Flow      Pain Score      Pain Loc      Pain Edu?      Excl. in [place]?    No data found.  Updated Vital Signs BP (!) 151/67 (BP Location: Left Arm)   Pulse (!) 56   Temp 97.9 F (36.6 C) (Oral)   Resp 18   Ht '5\' 9"'$  (1.753 m)   Wt 250 lb 3.2 oz (113.5 kg)   SpO2 94%   BMI 36.95 kg/m    Visual Acuity Right Eye Distance:   Left Eye Distance:   Bilateral Distance:    Right Eye Near:   Left Eye Near:    Bilateral Near:     Physical Exam Vitals and nursing note reviewed.  Constitutional:      General: She is not in acute distress.    Appearance: She is well-developed. She is not ill-appearing.  HENT:     Head: Normocephalic and atraumatic.     Mouth/Throat:     Mouth: Mucous membranes are moist.  Eyes:     Conjunctiva/sclera: Conjunctivae normal.  Cardiovascular:     Rate and Rhythm: Normal rate and regular rhythm.     Heart sounds: Normal heart sounds.  Pulmonary:     Effort: Pulmonary effort is normal. No respiratory distress.     Breath sounds: Normal breath sounds.  Abdominal:     Palpations: Abdomen is soft.     Tenderness: There is no abdominal tenderness. There is no right CVA tenderness, left CVA tenderness, guarding or rebound.  Musculoskeletal:     Cervical back: Neck supple.  Skin:    General: Skin is warm and dry.  Neurological:     Mental Status: She is alert.  Psychiatric:        Mood and Affect: Mood normal.        Behavior: Behavior normal.     UC Treatments / Results  Labs (all labs ordered are listed, but only abnormal results are displayed) Labs Reviewed  POCT URINALYSIS DIP (MANUAL ENTRY) - Abnormal; Notable for the following components:      Result Value   Color, UA colorless (*)    Spec Grav, UA <=1.005 (*)    All other components within normal limits    EKG   Radiology No results found.  Procedures Procedures (including critical care time)  Medications Ordered in UC Medications - No data to display  Initial Impression / Assessment and Plan / UC Course  I have reviewed the triage vital signs and the nursing notes.  Pertinent labs & imaging results that were available during my care of the patient were reviewed by me and considered in my medical decision making (see chart for details).  Dysuria.  Patient is  afebrile and well-appearing.  Urine does not show signs of infection today.  Instructed patient to follow-up with her PCP or urologist if her symptoms are not improving.  Education provided on dysuria.  Instructed patient to return here or go to the ED if her symptoms worsen.  She agrees to plan of care.   Final Clinical Impressions(s) / UC Diagnoses   Final diagnoses:  Dysuria     Discharge Instructions      Your urine does not show signs of infection today.  Follow-up with your primary care provider or a urologist if your symptoms are not improving.       ED Prescriptions   None    PDMP not reviewed this encounter.   Sharion Balloon, NP 02/07/21 1901

## 2021-02-13 ENCOUNTER — Encounter: Payer: Self-pay | Admitting: Obstetrics and Gynecology

## 2021-02-13 ENCOUNTER — Other Ambulatory Visit: Payer: Self-pay

## 2021-02-13 ENCOUNTER — Ambulatory Visit: Payer: Medicare Other | Admitting: Obstetrics and Gynecology

## 2021-02-13 VITALS — BP 141/84 | Ht 69.0 in | Wt 252.0 lb

## 2021-02-13 DIAGNOSIS — N390 Urinary tract infection, site not specified: Secondary | ICD-10-CM

## 2021-02-13 DIAGNOSIS — N952 Postmenopausal atrophic vaginitis: Secondary | ICD-10-CM

## 2021-02-13 DIAGNOSIS — N905 Atrophy of vulva: Secondary | ICD-10-CM

## 2021-02-13 DIAGNOSIS — N9089 Other specified noninflammatory disorders of vulva and perineum: Secondary | ICD-10-CM | POA: Diagnosis not present

## 2021-02-13 MED ORDER — ESTROGENS CONJUGATED 0.625 MG/GM VA CREA: 1.0000 | TOPICAL_CREAM | VAGINAL | 3 refills | Status: DC

## 2021-02-13 NOTE — Progress Notes (Signed)
Obstetrics & Gynecology Office Visit   Chief Complaint  Patient presents with   Vaginitis    "Raw and irritated"     History of Present Illness: 87 y.o. menopausal female who presents for follow up for vaginal irritation.  She used the Premarin for about 3 months.  It was prescribed in July 2021.  SHe has been having some itching and burning in her vaginal and vulvar area. She denies vaginal bleeding. She did try using premarin recently (last week or so) without success.    Past Medical History:  Diagnosis Date   Diabetes mellitus without complication ([place])    Dyspnea    Gallbladder calculus    GERD (gastroesophageal reflux disease)    Hyperlipidemia    Hypertension    Hypertensive retinopathy    OU   Kidney stone    Macular degeneration    Dry OU   Mass    Parotid swelling    UTI (lower urinary tract infection)     Past Surgical History:  Procedure Laterality Date   CATARACT EXTRACTION Bilateral 02/2020   Dr. Vevelyn Royals   CHOLECYSTECTOMY N/A 04/30/2016   Procedure: CHOLECYSTECTOMY;  Surgeon: Jules Husbands, MD;  Location: [facility] ORS;  Service: General;  Laterality: N/A;   DIAGNOSTIC LAPAROSCOPIC LIVER BIOPSY  02/28/2016   Procedure: DIAGNOSTIC LAPAROSCOPIC LIVER BIOPSY;  Surgeon: Jules Husbands, MD;  Location: [facility] ORS;  Service: General;;   ERCP N/A 01/13/2016   Procedure: ENDOSCOPIC RETROGRADE CHOLANGIOPANCREATOGRAPHY (ERCP);  Surgeon: Lucilla Lame, MD;  Location: [facility] ENDOSCOPY;  Service: Endoscopy;  Laterality: N/A;   ERCP N/A 04/08/2016   Procedure: ENDOSCOPIC RETROGRADE CHOLANGIOPANCREATOGRAPHY (ERCP) Stent removal;  Surgeon: Lucilla Lame, MD;  Location: [facility] ENDOSCOPY;  Service: Endoscopy;  Laterality: N/A;   EYE SURGERY Bilateral 02/2020   Cat Sx - Dr. Vevelyn Royals   INTRAOPERATIVE CHOLANGIOGRAM  04/30/2016   Procedure: INTRAOPERATIVE CHOLANGIOGRAM;  Surgeon: Jules Husbands, MD;  Location: [facility] ORS;  Service: General;;   KNEE ARTHROSCOPY Left    LAPAROSCOPY   02/28/2016   Procedure: Diagnotic laparoscopy with omental biopsy;  Surgeon: Jules Husbands, MD;  Location: [facility] ORS;  Service: General;;   TONSILLECTOMY      Gynecologic History: No LMP recorded. Patient is postmenopausal.  Obstetric History: No obstetric history on file.  Family History  Problem Relation Age of Onset   Cancer Father    Heart attack Father    Cancer Brother    Diabetes Brother    Diabetes Son    Breast cancer Neg Hx     Social History   Socioeconomic History   Marital status: Widowed    Spouse name: Not on file   Number of children: Not on file   Years of education: Not on file   Highest education level: Not on file  Occupational History   Not on file  Tobacco Use   Smoking status: Never   Smokeless tobacco: Never  Vaping Use   Vaping Use: Never used  Substance and Sexual Activity   Alcohol use: No   Drug use: No   Sexual activity: Not Currently  Other Topics Concern   Not on file  Social History Narrative   Not on file   Social Determinants of Health   Financial Resource Strain: Not on file  Food Insecurity: Not on file  Transportation Needs: Not on file  Physical Activity: Not on file  Stress: Not on file  Social Connections: Not on file  Intimate Partner Violence: Not on  file    Allergies  Allergen Reactions   Chlorhexidine Itching   Poison Oak Extract [Poison Oak Extract]    Ramipril Other (See Comments)    IRREGULAR HEART BEAT   Penicillins Rash    Has patient had a PCN reaction causing immediate rash, facial/tongue/throat swelling, SOB or lightheadedness with hypotension: Yes Has patient had a PCN reaction causing severe rash involving mucus membranes or skin necrosis: Yes Has patient had a PCN reaction that required hospitalization No Has patient had a PCN reaction occurring within the last 10 years: No If all of the above answers are "NO", then may proceed with Cephalosporin use.    Sulfa Antibiotics Rash    Prior to  Admission medications   Medication Sig Start Date End Date Taking? Authorizing Provider  acetaminophen (TYLENOL) 500 MG tablet Take 500 mg by mouth every 6 (six) hours as needed for mild pain.    [provider]  aspirin EC 81 MG tablet Take 81 mg by mouth daily.    [provider]  calcium carbonate (OS-CAL) 1250 (500 Ca) MG chewable tablet Chew by mouth.    [provider]  calcium carbonate (TUMS - DOSED IN MG ELEMENTAL CALCIUM) 500 MG chewable tablet Chew 2 tablets by mouth daily as needed for indigestion or heartburn.    [provider]  carboxymethylcellulose (REFRESH PLUS) 0.5 % SOLN 1 drop 3 (three) times daily as needed.    [provider]  clotrimazole-betamethasone (LOTRISONE) cream APP EXT AA BID 11/11/17   [provider]  conjugated estrogens (PREMARIN) vaginal cream Place 1 Applicatorful vaginally 2 (two) times a week. 1 gram vaginally at bedtime twice weekly 12/01/19   Will Bonnet, MD  glimepiride (AMARYL) 4 MG tablet Take 1 tablet (4 mg total) by mouth daily before breakfast. 11/19/20   Cletis Athens, MD  losartan (COZAAR) 100 MG tablet TAKE 1 TABLET BY MOUTH DAILY 01/04/21   Cletis Athens, MD  losartan-hydrochlorothiazide (HYZAAR) 100-25 MG tablet Take 1 tablet by mouth daily. 04/24/15   [provider]  metFORMIN (GLUCOPHAGE) 500 MG tablet Take 1 tablet by mouth 3 (three) times daily. 11/02/15   [provider]  metoprolol succinate (TOPROL-XL) 100 MG 24 hr tablet TAKE 1 TABLET BY MOUTH DAILY 11/23/19   Cletis Athens, MD  metoprolol succinate (TOPROL-XL) 100 MG 24 hr tablet TAKE 1 TABLET BY MOUTH DAILY 01/04/21   Cletis Athens, MD  Multiple Vitamins-Minerals (PRESERVISION AREDS 2 PO) Take by mouth.    [provider]  Multiple Vitamins-Minerals (PRESERVISION AREDS PO) Take 1 capsule by mouth 2 (two) times daily.     [provider]  nystatin-triamcinolone ointment (MYCOLOG) Apply 1 application  topically 2 (two) times daily. 05/31/20   Beckie Salts, FNP  omeprazole (PRILOSEC) 20 MG capsule Take by mouth. 05/08/14   [provider]  ondansetron (ZOFRAN ODT) 4 MG disintegrating tablet Take 1 tablet (4 mg total) by mouth every 8 (eight) hours as needed for nausea or vomiting. 10/07/20   Paulette Blanch, MD  ONE TOUCH ULTRA TEST test strip  11/02/15   [provider]  [facility] ULTRA test strip USE AS DIRECTED TO TEST BLOOD SUGAR 01/29/21   Cletis Athens, MD  pantoprazole (PROTONIX) 40 MG tablet TAKE 1 TABLET(40 MG) BY MOUTH DAILY 01/04/21   Cletis Athens, MD  pantoprazole (PROTONIX) 40 MG tablet TAKE 1 TABLET(40 MG) BY MOUTH DAILY 01/04/21   Cletis Athens, MD  phenazopyridine (PYRIDIUM) 95 MG tablet Take 1 tablet (  95 mg total) by mouth 3 (three) times daily as needed for pain. 11/03/19   Cletis Athens, MD  Phenazopyridine HCl (AZO TABS PO) Take by mouth.    [provider]  simvastatin (ZOCOR) 40 MG tablet TAKE 1 TABLET BY MOUTH DAILY 07/20/20   Beckie Salts, FNP  sitaGLIPtin (JANUVIA) 50 MG tablet Take 25 mg by mouth daily.     [provider]    Review of Systems  Constitutional: Negative.   HENT: Negative.    Eyes: Negative.   Respiratory: Negative.    Cardiovascular: Negative.   Gastrointestinal: Negative.   Genitourinary: Negative.   Musculoskeletal: Negative.   Skin: Negative.   Neurological: Negative.   Psychiatric/Behavioral: Negative.      Physical Exam BP (!) 141/84   Ht 5\' 9"  (1.753 m)   Wt 252 lb (114.3 kg)   BMI 37.21 kg/m  No LMP recorded. Patient is postmenopausal. Physical Exam Constitutional:      General: She is not in acute distress.    Appearance: Normal appearance.  Genitourinary:     Bladder and urethral meatus normal.     No lesions in the vagina.     Genitourinary Comments: Exam limited by body habitus     Right Labia: No rash, tenderness, lesions or skin changes.    Left Labia: No tenderness, lesions, skin changes or  rash.    No inguinal adenopathy present in the right or left side.    Pelvic Tanner Score: 5/5.    No vaginal discharge, erythema, tenderness, bleeding or ulceration.     No vaginal prolapse present.    Moderate vaginal atrophy present.     Right Adnexa: not tender, not full and no mass present.    Left Adnexa: not tender, not full and no mass present.    No cervical motion tenderness, discharge, friability, lesion or polyp.     Uterus is not enlarged, fixed or tender.     Uterus is anteverted.  HENT:     Head: Normocephalic and atraumatic.  Eyes:     General: No scleral icterus.    Conjunctiva/sclera: Conjunctivae normal.  Lymphadenopathy:     Lower Body: No right inguinal adenopathy. No left inguinal adenopathy.  Neurological:     General: No focal deficit present.     Mental Status: She is alert and oriented to person, place, and time.     Cranial Nerves: No cranial nerve deficit.  Psychiatric:        Mood and Affect: Mood normal.        Behavior: Behavior normal.        Judgment: Judgment normal.    Female chaperone present for pelvic and breast  portions of the physical exam  Assessment: 87 y.o. No obstetric history on file. female here for  1. Vulvar irritation   2. Vulvar atrophy   3. Vaginal atrophy   4. Recurrent UTI      Plan: Problem List Items Addressed This Visit   None Visit Diagnoses     Vulvar irritation    -  Primary   Relevant Medications   conjugated estrogens (PREMARIN) vaginal cream (Start on 02/14/2021)   Vulvar atrophy       Relevant Medications   conjugated estrogens (PREMARIN) vaginal cream (Start on 02/14/2021)   Vaginal atrophy       Relevant Medications   conjugated estrogens (PREMARIN) vaginal cream (Start on 02/14/2021)   Recurrent UTI       Relevant Medications  conjugated estrogens (PREMARIN) vaginal cream (Start on 02/14/2021)      Based on her description of symptoms and her history of similar symptoms, it appears that her lack  of use of estrogen has resulted in the recurrence of symptoms from last year.  We discussed that continued use will be the best way to prevent new symptoms.  I gave her a new prescription for Premarin.  If her symptoms do not improve within a month or so, she should let me know.  A total of 24 minutes were spent face-to-face with the patient as well as preparation, review, communication, and documentation during this encounter.    Prentice Docker, MD 02/13/2021 3:19 PM

## 2021-02-21 ENCOUNTER — Ambulatory Visit (INDEPENDENT_AMBULATORY_CARE_PROVIDER_SITE_OTHER): Payer: Medicare Other | Admitting: *Deleted

## 2021-02-21 DIAGNOSIS — Z0000 Encounter for general adult medical examination without abnormal findings: Secondary | ICD-10-CM

## 2021-02-21 NOTE — Progress Notes (Signed)
I have reviewed this visit and agree with the documentation.   

## 2021-02-21 NOTE — Progress Notes (Signed)
Subjective:   Victoria Dalton is a 87 y.o. female who presents for an Initial Medicare Annual Wellness Visit.  I discussed the limitations of evaluation and management by telemedicine and the availability of in person appointments. Patient expressed understanding and agreed to proceed.   Visit performed using audio  Patient:home Provider:home   Review of Systems    Defer to provider Cardiac Risk Factors include: advanced age (>21men, >23 women);diabetes mellitus;dyslipidemia;obesity (BMI >30kg/m2)     Objective:    Today's Vitals   02/21/21 1710  PainSc: 0-No pain   There is no height or weight on file to calculate BMI.  Advanced Directives 02/21/2021 10/06/2020 08/20/2019 12/25/2017 03/16/2017 06/05/2016 04/30/2016  Does Patient Have a Medical Advance Directive? Yes No No No No Yes No  Type of Advance Directive Healthcare Power of [place]  Does patient want to make changes to medical advance directive? - - - - - Yes (ED - Information included in AVS) -  Copy of [place] in Chart? No - copy requested - - - - No - copy requested No - copy requested  Would patient like information on creating a medical advance directive? - No - Patient declined - - - - No - Patient declined    Current Medications (verified) Outpatient Encounter Medications as of 02/21/2021  Medication Sig   acetaminophen (TYLENOL) 500 MG tablet Take 500 mg by mouth every 6 (six) hours as needed for mild pain.   aspirin EC 81 MG tablet Take 81 mg by mouth daily.   calcium carbonate (OS-CAL) 1250 (500 Ca) MG chewable tablet Chew by mouth.   calcium carbonate (TUMS - DOSED IN MG ELEMENTAL CALCIUM) 500 MG chewable tablet Chew 2 tablets by mouth daily as needed for indigestion or heartburn.   carboxymethylcellulose (REFRESH PLUS) 0.5 % SOLN 1 drop 3 (three) times daily as needed.   clotrimazole-betamethasone (LOTRISONE) cream APP EXT  AA BID   conjugated estrogens (PREMARIN) vaginal cream Place 1 Applicatorful vaginally 2 (two) times a week. 1 gram vaginally at bedtime twice weekly   glimepiride (AMARYL) 4 MG tablet Take 1 tablet (4 mg total) by mouth daily before breakfast.   losartan (COZAAR) 100 MG tablet TAKE 1 TABLET BY MOUTH DAILY   losartan-hydrochlorothiazide (HYZAAR) 100-25 MG tablet Take 1 tablet by mouth daily.   metFORMIN (GLUCOPHAGE) 500 MG tablet Take 1 tablet by mouth 3 (three) times daily.   metoprolol succinate (TOPROL-XL) 100 MG 24 hr tablet TAKE 1 TABLET BY MOUTH DAILY   metoprolol succinate (TOPROL-XL) 100 MG 24 hr tablet TAKE 1 TABLET BY MOUTH DAILY   Multiple Vitamins-Minerals (PRESERVISION AREDS 2 PO) Take by mouth.   Multiple Vitamins-Minerals (PRESERVISION AREDS PO) Take 1 capsule by mouth 2 (two) times daily.    nystatin-triamcinolone ointment (MYCOLOG) Apply 1 application topically 2 (two) times daily.   omeprazole (PRILOSEC) 20 MG capsule Take by mouth.   ondansetron (ZOFRAN ODT) 4 MG disintegrating tablet Take 1 tablet (4 mg total) by mouth every 8 (eight) hours as needed for nausea or vomiting.   ONE TOUCH ULTRA TEST test strip    ONETOUCH ULTRA test strip USE AS DIRECTED TO TEST BLOOD SUGAR   pantoprazole (PROTONIX) 40 MG tablet TAKE 1 TABLET(40 MG) BY MOUTH DAILY   pantoprazole (PROTONIX) 40 MG tablet TAKE 1 TABLET(40 MG) BY MOUTH DAILY   phenazopyridine (PYRIDIUM) 95 MG tablet Take 1 tablet (95 mg total)  by mouth 3 (three) times daily as needed for pain.   Phenazopyridine HCl (AZO TABS PO) Take by mouth.   simvastatin (ZOCOR) 40 MG tablet TAKE 1 TABLET BY MOUTH DAILY   sitaGLIPtin (JANUVIA) 50 MG tablet Take 25 mg by mouth daily.    No facility-administered encounter medications on file as of 02/21/2021.    Allergies (verified) Chlorhexidine, Poison oak extract [poison oak extract], Ramipril, Penicillins, and Sulfa antibiotics   History: Past Medical History:  Diagnosis Date    Diabetes mellitus without complication ([place])    Dyspnea    Gallbladder calculus    GERD (gastroesophageal reflux disease)    Hyperlipidemia    Hypertension    Hypertensive retinopathy    OU   Kidney stone    Macular degeneration    Dry OU   Mass    Parotid swelling    UTI (lower urinary tract infection)    Past Surgical History:  Procedure Laterality Date   CATARACT EXTRACTION Bilateral 02/2020   Dr. Vevelyn Royals   CHOLECYSTECTOMY N/A 04/30/2016   Procedure: CHOLECYSTECTOMY;  Surgeon: Jules Husbands, MD;  Location: [facility] ORS;  Service: General;  Laterality: N/A;   DIAGNOSTIC LAPAROSCOPIC LIVER BIOPSY  02/28/2016   Procedure: DIAGNOSTIC LAPAROSCOPIC LIVER BIOPSY;  Surgeon: Jules Husbands, MD;  Location: [facility] ORS;  Service: General;;   ERCP N/A 01/13/2016   Procedure: ENDOSCOPIC RETROGRADE CHOLANGIOPANCREATOGRAPHY (ERCP);  Surgeon: Lucilla Lame, MD;  Location: [facility] ENDOSCOPY;  Service: Endoscopy;  Laterality: N/A;   ERCP N/A 04/08/2016   Procedure: ENDOSCOPIC RETROGRADE CHOLANGIOPANCREATOGRAPHY (ERCP) Stent removal;  Surgeon: Lucilla Lame, MD;  Location: [facility] ENDOSCOPY;  Service: Endoscopy;  Laterality: N/A;   EYE SURGERY Bilateral 02/2020   Cat Sx - Dr. Vevelyn Royals   INTRAOPERATIVE CHOLANGIOGRAM  04/30/2016   Procedure: INTRAOPERATIVE CHOLANGIOGRAM;  Surgeon: Jules Husbands, MD;  Location: [facility] ORS;  Service: General;;   KNEE ARTHROSCOPY Left    LAPAROSCOPY  02/28/2016   Procedure: Diagnotic laparoscopy with omental biopsy;  Surgeon: Jules Husbands, MD;  Location: [facility] ORS;  Service: General;;   TONSILLECTOMY     Family History  Problem Relation Age of Onset   Cancer Father    Heart attack Father    Cancer Brother    Diabetes Brother    Diabetes Son    Breast cancer Neg Hx    Social History   Socioeconomic History   Marital status: Widowed    Spouse name: Not on file   Number of children: Not on file   Years of education: Not on file   Highest education level: Not on  file  Occupational History   Not on file  Tobacco Use   Smoking status: Never   Smokeless tobacco: Never  Vaping Use   Vaping Use: Never used  Substance and Sexual Activity   Alcohol use: No   Drug use: No   Sexual activity: Not Currently  Other Topics Concern   Not on file  Social History Narrative   Not on file   Social Determinants of Health   Financial Resource Strain: Low Risk    Difficulty of Paying Living Expenses: Not very hard  Food Insecurity: No Food Insecurity   Worried About Running Out of Food in the Last Year: Never true   Ran Out of Food in the Last Year: Never true  Transportation Needs: No Transportation Needs   Lack of Transportation (Medical): No   Lack of Transportation (Non-Medical): No  Physical Activity: Inactive  Days of Exercise per Week: 0 days   Minutes of Exercise per Session: 0 min  Stress: No Stress Concern Present   Feeling of Stress : Not at all  Social Connections: Socially Isolated   Frequency of Communication with Friends and Family: More than three times a week   Frequency of Social Gatherings with Friends and Family: More than three times a week   Attends Religious Services: Never   Marine scientist or Organizations: No   Attends Archivist Meetings: Never   Marital Status: Widowed    Tobacco Counseling Counseling given: Not Answered   Clinical Intake:  Pre-visit preparation completed: Yes  Pain : No/denies pain Pain Score: 0-No pain     Diabetes: Yes CBG done?: No Did pt. bring in CBG monitor from home?: No  How often do you need to have someone help you when you read instructions, pamphlets, or other written materials from your doctor or pharmacy?: 2 - Rarely What is the last grade level you completed in school?: 12  Diabetic?Yes  Interpreter Needed?: No  Information entered by :: Doreen Salvage, [place]   Activities of Daily Living In your present state of health, do you have any difficulty  performing the following activities: 02/21/2021  Hearing? N  Vision? N  Difficulty concentrating or making decisions? N  Walking or climbing stairs? N  Dressing or bathing? N  Doing errands, shopping? N  Preparing Food and eating ? N  Using the Toilet? N  In the past six months, have you accidently leaked urine? Y  Do you have problems with loss of bowel control? N  Managing your Medications? N  Managing your Finances? N  Housekeeping or managing your Housekeeping? N  Some recent data might be hidden    Patient Care Team: Cletis Athens, MD as PCP - General (Internal Medicine)  Indicate any recent Medical Services you may have received from other than [facility] providers in the past year (date may be approximate).     Assessment:   This is a routine wellness examination for Victoria Dalton.  Hearing/Vision screen No results found.  Dietary issues and exercise activities discussed: Current Exercise Habits: The patient does not participate in regular exercise at present, Exercise limited by: orthopedic condition(s)   Goals Addressed   None    Depression Screen PHQ 2/9 Scores 02/21/2021 02/21/2021 01/30/2021  PHQ - 2 Score 0 0 0    Fall Risk Fall Risk  02/21/2021 01/30/2021  Falls in the past year? 1 0  Number falls in past yr: 0 0  Injury with Fall? 0 0  Risk for fall due to : No Fall Risks Impaired mobility  Follow up Falls evaluation completed Falls evaluation completed    [place]:  Any stairs in or around the home? No  If so, are there any without handrails? No  Home free of loose throw rugs in walkways, pet beds, electrical cords, etc? Yes  Adequate lighting in your home to reduce risk of falls? Yes   ASSISTIVE DEVICES UTILIZED TO PREVENT FALLS:  Life alert? Yes  Use of a cane, walker or w/c? Yes  Grab bars in the bathroom? No  Shower chair or bench in shower? Yes  Elevated toilet seat or a handicapped toilet? No   TIMED UP AND GO:  Was  the test performed? No .  Length of time to ambulate- NA   Gait slow and steady with assistive device  Cognitive Function:  MMSE - Mini Mental State Exam 02/21/2021  Orientation to time 5  Orientation to Place 5  Registration 3  Attention/ Calculation 5  Recall 3  Language- name 2 objects 2  Language- repeat 1  Language- follow 3 step command 3  Language- read & follow direction 1  Write a sentence 0  Copy design 0  Total score 28     6CIT Screen 02/21/2021  What Year? 0 points  What month? 0 points  What time? 0 points  Count back from 20 0 points  Months in reverse 0 points  Repeat phrase 0 points  Total Score 0    Immunizations Immunization History  Administered Date(s) Administered   Fluad Quad(high Dose 65+) 03/29/2020, 01/30/2021   Influenza-Unspecified 01/24/2018   Meningococcal Conjugate 02/08/2016   Moderna Sars-Covid-2 Vaccination 07/10/2019, 08/08/2019   Pneumococcal Polysaccharide-23 01/28/2015, 08/12/2017   Pneumococcal-Unspecified 09/19/2002, 06/15/2014   Tdap 09/30/2012   Zoster Recombinat (Shingrix) 10/30/2008   Zoster, Live 05/26/2008    TDAP status: Up to date  Flu Vaccine status: Up to date  Pneumococcal vaccine status: Due, Education has been provided regarding the importance of this vaccine. Advised may receive this vaccine at local pharmacy or Health Dept. Aware to provide a copy of the vaccination record if obtained from local pharmacy or Health Dept. Verbalized acceptance and understanding.  Covid-19 vaccine status: Completed vaccines  Qualifies for Shingles Vaccine? Yes   Zostavax completed Yes   Shingrix Completed?: Yes  Screening Tests Health Maintenance  Topic Date Due   FOOT EXAM  Never done   DEXA SCAN  Never done   Zoster Vaccines- Shingrix (2 of 2) 12/25/2008   COVID-19 Vaccine (3 - Moderna risk series) 09/05/2019   HEMOGLOBIN A1C  04/14/2021   OPHTHALMOLOGY EXAM  01/16/2022   TETANUS/TDAP  10/01/2022   INFLUENZA VACCINE   Completed   HPV VACCINES  Aged Out    Health Maintenance  Health Maintenance Due  Topic Date Due   FOOT EXAM  Never done   DEXA SCAN  Never done   Zoster Vaccines- Shingrix (2 of 2) 12/25/2008   COVID-19 Vaccine (3 - Moderna risk series) 09/05/2019    Colorectal cancer screening: Type of screening: Colonoscopy. Completed 2012. Repeat every 10 years- but no longer required due to age   Mammogram status: No longer required due to age.  Bone Density status: Completed - unsure of date. Results reflect: Bone density results: NORMAL. Repeat every 2 years. Lung Cancer Screening: (Low Dose CT Chest recommended if Age 28-80 years, 30 pack-year currently smoking OR have quit w/in 15years.) does not qualify.   Lung Cancer Screening Referral: No  Additional Screening:  Hepatitis C Screening: does not qualify; Completed No  Vision Screening: Recommended annual ophthalmology exams for early detection of glaucoma and other disorders of the eye. Is the patient up to date with their annual eye exam?  Yes  Who is the provider or what is the name of the office in which the patient attends annual eye exams? unknown If pt is not established with a provider, would they like to be referred to a provider to establish care? No   Dental Screening: Recommended annual dental exams for proper oral hygiene  Community Resource Referral / Chronic Care Management: CRR required this visit?  No   CCM required this visit?  No      Plan:     I have personally reviewed and noted the following in the patient's chart:   Medical and  social history Use of alcohol, tobacco or illicit drugs  Current medications and supplements including opioid prescriptions. Patient is not currently taking opioid prescriptions. Functional ability and status Nutritional status Physical activity Advanced directives List of other physicians Hospitalizations, surgeries, and ER visits in previous 12 months Vitals Screenings  to include cognitive, depression, and falls Referrals and appointments  In addition, I have reviewed and discussed with patient certain preventive protocols, quality metrics, and best practice recommendations. A written personalized care plan for preventive services as well as general preventive health recommendations were provided to patient.     Victoria Dalton, [place]   02/21/2021   Nurse Notes: Victoria Dalton , Thank you for taking time to come for your Medicare Wellness Visit. I appreciate your ongoing commitment to your health goals. Please review the following plan we discussed and let me know if I can assist you in the future.   These are the goals we discussed:  Goals   None     This is a list of the screening recommended for you and due dates:  Health Maintenance  Topic Date Due   Complete foot exam   Never done   DEXA scan (bone density measurement)  Never done   Zoster (Shingles) Vaccine (2 of 2) 12/25/2008   COVID-19 Vaccine (3 - Moderna risk series) 09/05/2019   Hemoglobin A1C  04/14/2021   Eye exam for diabetics  01/16/2022   Tetanus Vaccine  10/01/2022   Flu Shot  Completed   HPV Vaccine  Aged Out       Time spent with patient 30 min

## 2021-04-04 ENCOUNTER — Other Ambulatory Visit: Payer: Self-pay | Admitting: Internal Medicine

## 2021-05-01 ENCOUNTER — Ambulatory Visit (INDEPENDENT_AMBULATORY_CARE_PROVIDER_SITE_OTHER): Payer: Medicare Other | Admitting: Internal Medicine

## 2021-05-01 ENCOUNTER — Other Ambulatory Visit: Payer: Self-pay | Admitting: *Deleted

## 2021-05-01 ENCOUNTER — Encounter: Payer: Self-pay | Admitting: Internal Medicine

## 2021-05-01 ENCOUNTER — Other Ambulatory Visit: Payer: Self-pay

## 2021-05-01 VITALS — BP 150/80 | HR 58 | Ht 69.0 in | Wt 259.0 lb

## 2021-05-01 DIAGNOSIS — I10 Essential (primary) hypertension: Secondary | ICD-10-CM

## 2021-05-01 DIAGNOSIS — E785 Hyperlipidemia, unspecified: Secondary | ICD-10-CM

## 2021-05-01 DIAGNOSIS — R599 Enlarged lymph nodes, unspecified: Secondary | ICD-10-CM | POA: Diagnosis not present

## 2021-05-01 DIAGNOSIS — R079 Chest pain, unspecified: Secondary | ICD-10-CM

## 2021-05-01 DIAGNOSIS — E119 Type 2 diabetes mellitus without complications: Secondary | ICD-10-CM | POA: Diagnosis not present

## 2021-05-01 DIAGNOSIS — Z23 Encounter for immunization: Secondary | ICD-10-CM

## 2021-05-01 LAB — POCT GLYCOSYLATED HEMOGLOBIN (HGB A1C): Hemoglobin A1C: 7.2 % — AB (ref 4.0–5.6)

## 2021-05-01 LAB — GLUCOSE, POCT (MANUAL RESULT ENTRY): POC Glucose: 115 mg/dl — AB (ref 70–99)

## 2021-05-01 MED ORDER — ZOSTER VAC RECOMB ADJUVANTED 50 MCG/0.5ML IM SUSR: 0.5000 mL | Freq: Once | INTRAMUSCULAR | 1 refills | Status: AC

## 2021-05-01 NOTE — Progress Notes (Signed)
Established Patient Office Visit  Subjective:  Patient ID: Victoria Dalton, female    DOB: 11-Sep-1930  Age: 87 y.o. MRN: [id number]  CC:  Chief Complaint  Patient presents with   Diabetes    3 month follow up    Diabetes   Victoria Dalton presents for check up  Past Medical History:  Diagnosis Date   Diabetes mellitus without complication ([place])    Dyspnea    Gallbladder calculus    GERD (gastroesophageal reflux disease)    Hyperlipidemia    Hypertension    Hypertensive retinopathy    OU   Kidney stone    Macular degeneration    Dry OU   Mass    Parotid swelling    UTI (lower urinary tract infection)     Past Surgical History:  Procedure Laterality Date   CATARACT EXTRACTION Bilateral 02/2020   Dr. Vevelyn Royals   CHOLECYSTECTOMY N/A 04/30/2016   Procedure: CHOLECYSTECTOMY;  Surgeon: Jules Husbands, MD;  Location: [facility] ORS;  Service: General;  Laterality: N/A;   DIAGNOSTIC LAPAROSCOPIC LIVER BIOPSY  02/28/2016   Procedure: DIAGNOSTIC LAPAROSCOPIC LIVER BIOPSY;  Surgeon: Jules Husbands, MD;  Location: [facility] ORS;  Service: General;;   ERCP N/A 01/13/2016   Procedure: ENDOSCOPIC RETROGRADE CHOLANGIOPANCREATOGRAPHY (ERCP);  Surgeon: Lucilla Lame, MD;  Location: [facility]OS[facility];  Service: Endoscopy;  Laterality: N/A;   ERCP N/A 04/08/2016   Procedure: ENDOSCOPIC RETROGRADE CHOLANGIOPANCREATOGRAPHY (ERCP) Stent removal;  Surgeon: Lucilla Lame, MD;  Location: [facility] ENDOSCOPY;  Service: Endoscopy;  Laterality: N/A;   EYE SURGERY Bilateral 02/2020   Cat Sx - Dr. Vevelyn Royals   INTRAOPERATIVE CHOLANGIOGRAM  04/30/2016   Procedure: INTRAOPERATIVE CHOLANGIOGRAM;  Surgeon: Jules Husbands, MD;  Location: [facility] ORS;  Service: General;;   KNEE ARTHROSCOPY Left    LAPAROSCOPY  02/28/2016   Procedure: Diagnotic laparoscopy with omental biopsy;  Surgeon: Jules Husbands, MD;  Location: [facility] ORS;  Service: General;;   TONSILLECTOMY      Family History  Problem Relation Age of  Onset   Cancer Father    Heart attack Father    Cancer Brother    Diabetes Brother    Diabetes Son    Breast cancer Neg Hx     Social History   Socioeconomic History   Marital status: Widowed    Spouse name: Not on file   Number of children: Not on file   Years of education: Not on file   Highest education level: Not on file  Occupational History   Not on file  Tobacco Use   Smoking status: Never   Smokeless tobacco: Never  Vaping Use   Vaping Use: Never used  Substance and Sexual Activity   Alcohol use: No   Drug use: No   Sexual activity: Not Currently  Other Topics Concern   Not on file  Social History Narrative   Not on file   Social Determinants of Health   Financial Resource Strain: Low Risk    Difficulty of Paying Living Expenses: Not very hard  Food Insecurity: No Food Insecurity   Worried About Running Out of Food in the Last Year: Never true   Ran Out of Food in the Last Year: Never true  Transportation Needs: No Transportation Needs   Lack of Transportation (Medical): No   Lack of Transportation (Non-Medical): No  Physical Activity: Inactive   Days of Exercise per Week: 0 days   Minutes of Exercise per Session: 0 min  Stress: No Stress Concern Present   Feeling of Stress : Not at all  Social Connections: Socially Isolated   Frequency of Communication with Friends and Family: More than three times a week   Frequency of Social Gatherings with Friends and Family: More than three times a week   Attends Religious Services: Never   Marine scientist or Organizations: No   Attends Archivist Meetings: Never   Marital Status: Widowed  Human resources officer Violence: Not At Risk   Fear of Current or Ex-Partner: No   Emotionally Abused: No   Physically Abused: No   Sexually Abused: No     Current Outpatient Medications:    acetaminophen (TYLENOL) 500 MG tablet, Take 500 mg by mouth every 6 (six) hours as needed for mild pain., Disp: , Rfl:     aspirin EC 81 MG tablet, Take 81 mg by mouth daily., Disp: , Rfl:    calcium carbonate (OS-CAL) 1250 (500 Ca) MG chewable tablet, Chew by mouth., Disp: , Rfl:    calcium carbonate (TUMS - DOSED IN MG ELEMENTAL CALCIUM) 500 MG chewable tablet, Chew 2 tablets by mouth daily as needed for indigestion or heartburn., Disp: , Rfl:    carboxymethylcellulose (REFRESH PLUS) 0.5 % SOLN, 1 drop 3 (three) times daily as needed., Disp: , Rfl:    clotrimazole-betamethasone (LOTRISONE) cream, APP EXT AA BID, Disp: , Rfl:    conjugated estrogens (PREMARIN) vaginal cream, Place 1 Applicatorful vaginally 2 (two) times a week. 1 gram vaginally at bedtime twice weekly, Disp: 30 g, Rfl: 3   glimepiride (AMARYL) 4 MG tablet, Take 1 tablet (4 mg total) by mouth daily before breakfast., Disp: 30 tablet, Rfl: 3   losartan (COZAAR) 100 MG tablet, TAKE 1 TABLET BY MOUTH DAILY, Disp: 30 tablet, Rfl: 6   metoprolol succinate (TOPROL-XL) 100 MG 24 hr tablet, TAKE 1 TABLET BY MOUTH DAILY, Disp: 30 tablet, Rfl: 3   metoprolol succinate (TOPROL-XL) 100 MG 24 hr tablet, TAKE 1 TABLET BY MOUTH DAILY, Disp: 30 tablet, Rfl: 3   Multiple Vitamins-Minerals (PRESERVISION AREDS 2 PO), Take by mouth., Disp: , Rfl:    nystatin-triamcinolone ointment (MYCOLOG), Apply 1 application topically 2 (two) times daily., Disp: 30 g, Rfl: 0   omeprazole (PRILOSEC) 20 MG capsule, Take by mouth., Disp: , Rfl:    ondansetron (ZOFRAN ODT) 4 MG disintegrating tablet, Take 1 tablet (4 mg total) by mouth every 8 (eight) hours as needed for nausea or vomiting., Disp: 20 tablet, Rfl: 0   ONE TOUCH ULTRA TEST test strip, , Disp: , Rfl: 0   ONETOUCH ULTRA test strip, USE AS DIRECTED TO TEST BLOOD SUGAR, Disp: 100 strip, Rfl: 6   phenazopyridine (PYRIDIUM) 95 MG tablet, Take 1 tablet (95 mg total) by mouth 3 (three) times daily as needed for pain., Disp: 90 tablet, Rfl: 0   Phenazopyridine HCl (AZO TABS PO), Take by mouth., Disp: , Rfl:    simvastatin (ZOCOR) 40  MG tablet, TAKE 1 TABLET BY MOUTH DAILY, Disp: 90 tablet, Rfl: 3   Zoster Vaccine Adjuvanted (SHINGRIX) injection, Inject 0.5 mLs into the muscle once for 1 dose., Disp: 0.5 mL, Rfl: 1   Allergies  Allergen Reactions   Chlorhexidine Itching   Poison Oak Extract [Poison Oak Extract]    Ramipril Other (See Comments)    IRREGULAR HEART BEAT   Penicillins Rash    Has patient had a PCN reaction causing immediate rash, facial/tongue/throat swelling, SOB or lightheadedness with hypotension: Yes  Has patient had a PCN reaction causing severe rash involving mucus membranes or skin necrosis: Yes Has patient had a PCN reaction that required hospitalization No Has patient had a PCN reaction occurring within the last 10 years: No If all of the above answers are "NO", then may proceed with Cephalosporin use.    Sulfa Antibiotics Rash    ROS Review of Systems  Constitutional: Negative.   HENT: Negative.    Eyes: Negative.   Respiratory: Negative.    Cardiovascular: Negative.   Gastrointestinal: Negative.   Endocrine: Negative.   Genitourinary: Negative.   Musculoskeletal: Negative.   Skin: Negative.   Allergic/Immunologic: Negative.   Neurological: Negative.   Hematological: Negative.   Psychiatric/Behavioral: Negative.    All other systems reviewed and are negative.    Objective:    Physical Exam Vitals reviewed.  Constitutional:      Appearance: Normal appearance.  HENT:     Mouth/Throat:     Mouth: Mucous membranes are moist.  Eyes:     Pupils: Pupils are equal, round, and reactive to light.  Neck:     Vascular: No carotid bruit.  Cardiovascular:     Rate and Rhythm: Normal rate and regular rhythm.     Pulses: Normal pulses.     Heart sounds: Normal heart sounds.  Pulmonary:     Effort: Pulmonary effort is normal.     Breath sounds: Normal breath sounds.  Abdominal:     General: Bowel sounds are normal.     Palpations: Abdomen is soft. There is no hepatomegaly,  splenomegaly or mass.     Tenderness: There is no abdominal tenderness.     Hernia: No hernia is present.  Musculoskeletal:        General: No tenderness.     Cervical back: Neck supple.     Right lower leg: No edema.     Left lower leg: No edema.  Skin:    Findings: No rash.  Neurological:     Mental Status: She is alert and oriented to person, place, and time.     Motor: No weakness.  Psychiatric:        Mood and Affect: Mood and affect normal.        Behavior: Behavior normal.    BP (!) 150/80   Pulse (!) 58   Ht 5\' 9"  (1.753 m)   Wt 259 lb (117.5 kg)   BMI 38.25 kg/m  Wt Readings from Last 3 Encounters:  05/01/21 259 lb (117.5 kg)  02/13/21 252 lb (114.3 kg)  02/07/21 250 lb 3.2 oz (113.5 kg)     Health Maintenance Due  Topic Date Due   FOOT EXAM  Never done   DEXA SCAN  Never done   Zoster Vaccines- Shingrix (2 of 2) 12/25/2008   Pneumonia Vaccine 13+ Years old (3 - PCV) 08/13/2018   COVID-19 Vaccine (3 - Moderna risk series) 09/05/2019    There are no preventive care reminders to display for this patient.  Lab Results  Component Value Date   TSH 2.19 04/05/2020   Lab Results  Component Value Date   WBC 17.1 (H) 10/06/2020   HGB 14.2 10/06/2020   HCT 42.6 10/06/2020   MCV 92.2 10/06/2020   PLT 203 10/06/2020   Lab Results  Component Value Date   NA 138 10/12/2020   K 4.6 10/12/2020   CO2 28 10/12/2020   GLUCOSE 236 (H) 10/12/2020   BUN 20 10/12/2020   CREATININE 1.05 (H) 10/12/2020  BILITOT 0.8 10/06/2020   ALKPHOS 108 10/06/2020   AST 26 10/06/2020   ALT 17 10/06/2020   PROT 7.0 10/06/2020   ALBUMIN 4.0 10/06/2020   CALCIUM 9.2 10/12/2020   ANIONGAP 12 10/06/2020   Lab Results  Component Value Date   CHOL 169 04/05/2020   Lab Results  Component Value Date   HDL 53 04/05/2020   Lab Results  Component Value Date   LDLCALC 97 04/05/2020   Lab Results  Component Value Date   TRIG 99 04/05/2020   Lab Results  Component Value  Date   CHOLHDL 3.2 04/05/2020   Lab Results  Component Value Date   HGBA1C 7.2 (A) 05/01/2021      Assessment & Plan:   Problem List Items Addressed This Visit       Cardiovascular and Mediastinum   Benign essential HTN     Endocrine   Type 2 diabetes mellitus without complication ([place]) - Primary    - The patient's blood sugar is labile on med. - The patient will continue the current treatment regimen.  - I encouraged the patient to regularly check blood sugar.  - I encouraged the patient to monitor diet. I encouraged the patient to eat low-carb and low-sugar to help prevent blood sugar spikes.  - I encouraged the patient to continue following their prescribed treatment plan for diabetes - I informed the patient to get help if blood sugar drops below 54mg /dL, or if suddenly have trouble thinking clearly or breathing.  Patient was advised to buy a book on diabetes from a local bookstore or from [place]  Patient should read 2 chapters every day to keep the motivation going, this is in addition to some of the materials we provided them from the office.  There are other resources on the Internet like YouTube and wilkipedia to get an education on the diabetes      Relevant Orders   POCT glycosylated hemoglobin (Hb A1C) (Completed)   POCT glucose (manual entry) (Completed)     Immune and Lymphatic   Lymph node enlargement    Patient has a submandibular enlargement of the lymph node.  Refer to ENT        Other   Dyslipidemia    Hypercholesterolemia  I advised the patient to follow Mediterranean diet This diet is rich in fruits vegetables and whole grain, and This diet is also rich in fish and lean meat Patient should also eat a handful of almonds or walnuts daily Recent heart study indicated that average follow-up on this kind of diet reduces the cardiovascular mortality by 50 to 70%==      Class 3 severe obesity due to excess calories in adult [facility])    Behavioral  modification strategies: increasing lean protein intake, decreasing simple carbohydrates, increasing vegetables, increasing water intake, decreasing eating out, no skipping meals, meal planning and cooking strategies, keeping healthy foods in the home and planning for success.      Need for shingles vaccine    Patient has received shingles shot      Relevant Medications   Zoster Vaccine Adjuvanted [facility]) injection    Meds ordered this encounter  Medications   Zoster Vaccine Adjuvanted [facility]) injection    Sig: Inject 0.5 mLs into the muscle once for 1 dose.    Dispense:  0.5 mL    Refill:  1    Follow-up: No follow-ups on file.    Cletis Athens, MD

## 2021-05-01 NOTE — Assessment & Plan Note (Signed)
Patient has received shingles shot

## 2021-05-01 NOTE — Assessment & Plan Note (Signed)
Patient has a submandibular enlargement of the lymph node.  Refer to ENT

## 2021-05-01 NOTE — Assessment & Plan Note (Signed)
Behavioral modification strategies: increasing lean protein intake, decreasing simple carbohydrates, increasing vegetables, increasing water intake, decreasing eating out, no skipping meals, meal planning and cooking strategies, keeping healthy foods in the home and planning for success. 

## 2021-05-01 NOTE — Assessment & Plan Note (Signed)
Hypercholesterolemia  I advised the patient to follow Mediterranean diet This diet is rich in fruits vegetables and whole grain, and This diet is also rich in fish and lean meat Patient should also eat a handful of almonds or walnuts daily Recent heart study indicated that average follow-up on this kind of diet reduces the cardiovascular mortality by 50 to 70%== 

## 2021-05-01 NOTE — Assessment & Plan Note (Signed)

## 2021-05-10 ENCOUNTER — Other Ambulatory Visit: Payer: Self-pay | Admitting: Internal Medicine

## 2021-05-13 DIAGNOSIS — K1121 Acute sialoadenitis: Secondary | ICD-10-CM | POA: Diagnosis not present

## 2021-05-13 DIAGNOSIS — H90A32 Mixed conductive and sensorineural hearing loss, unilateral, left ear with restricted hearing on the contralateral side: Secondary | ICD-10-CM | POA: Diagnosis not present

## 2021-05-15 NOTE — Progress Notes (Signed)
[facility] Note  05/22/2021     CHIEF COMPLAINT Patient presents for Retina Follow Up    HISTORY OF PRESENT ILLNESS: Victoria Dalton is a 87 y.o. female who presents to the clinic today for:  HPI     Retina Follow Up   Patient presents with  Dry AMD.  In both eyes.  Severity is moderate.  Duration of 4 months.  Since onset it is stable.  I, the attending physician,  performed the HPI with the patient and updated documentation appropriately.        Comments   Pt here for 4 mo ret f/u for non exu ARMD OU. Pt states no vision issues or changes. Blood sugar this am was 129. She had an A1C done a while back but does not recall the level.       Last edited by Bernarda Caffey, MD on 05/22/2021  1:01 PM.    Pt states vision is doing well, she had an infection on the side of her neck that she as given Prednisone for, she just finished taking it  Referring physician: Vevelyn Royals, MD [address] [place], [place] [address]  HISTORICAL INFORMATION:   Selected notes from the [place] Referred by Dr. Lucita Ferrara LEE: 02/22/2020 BCVA: OD 20/70, OS 20/70 Ocular Hx- Mac cyst OU, Cataracts OU, Band Keratopathy OU, ARMD OU PMH- DM   CURRENT MEDICATIONS: Current Outpatient Medications (Ophthalmic Drugs)  Medication Sig   carboxymethylcellulose (REFRESH PLUS) 0.5 % SOLN 1 drop 3 (three) times daily as needed.   No current facility-administered medications for this visit. (Ophthalmic Drugs)   Current Outpatient Medications (Other)  Medication Sig   acetaminophen (TYLENOL) 500 MG tablet Take 500 mg by mouth every 6 (six) hours as needed for mild pain.   aspirin EC 81 MG tablet Take 81 mg by mouth daily.   clotrimazole-betamethasone (LOTRISONE) cream APP EXT AA BID   conjugated estrogens (PREMARIN) vaginal cream Place 1 Applicatorful vaginally 2 (two) times a week. 1 gram vaginally at bedtime twice weekly   glimepiride (AMARYL) 4 MG  tablet TAKE 1 TABLET(4 MG) BY MOUTH DAILY BEFORE BREAKFAST   losartan (COZAAR) 100 MG tablet TAKE 1 TABLET BY MOUTH DAILY   metoprolol succinate (TOPROL-XL) 100 MG 24 hr tablet TAKE 1 TABLET BY MOUTH DAILY   Multiple Vitamins-Minerals (PRESERVISION AREDS 2 PO) Take by mouth.   nystatin-triamcinolone ointment (MYCOLOG) Apply 1 application topically 2 (two) times daily.   omeprazole (PRILOSEC) 20 MG capsule Take by mouth.   simvastatin (ZOCOR) 40 MG tablet TAKE 1 TABLET BY MOUTH DAILY   calcium carbonate (OS-CAL) 1250 (500 Ca) MG chewable tablet Chew by mouth. (Patient not taking: Reported on 05/22/2021)   calcium carbonate (TUMS - DOSED IN MG ELEMENTAL CALCIUM) 500 MG chewable tablet Chew 2 tablets by mouth daily as needed for indigestion or heartburn. (Patient not taking: Reported on 05/22/2021)   metoprolol succinate (TOPROL-XL) 100 MG 24 hr tablet TAKE 1 TABLET BY MOUTH DAILY   ondansetron (ZOFRAN ODT) 4 MG disintegrating tablet Take 1 tablet (4 mg total) by mouth every 8 (eight) hours as needed for nausea or vomiting. (Patient not taking: Reported on 05/22/2021)   ONE TOUCH ULTRA TEST test strip    ONETOUCH ULTRA test strip USE AS DIRECTED TO TEST BLOOD SUGAR   phenazopyridine (PYRIDIUM) 95 MG tablet Take 1 tablet (95 mg total) by mouth 3 (three) times daily as needed for pain. (Patient  not taking: Reported on 05/22/2021)   Phenazopyridine HCl (AZO TABS PO) Take by mouth. (Patient not taking: Reported on 05/22/2021)   No current facility-administered medications for this visit. (Other)   REVIEW OF SYSTEMS: ROS   Positive for: Gastrointestinal, Endocrine, Eyes Negative for: Constitutional, Neurological, Skin, Genitourinary, Musculoskeletal, HENT, Cardiovascular, Respiratory, Psychiatric, Allergic/Imm, Heme/Lymph Last edited by Kingsley Spittle, COT on 05/22/2021  7:56 AM.      ALLERGIES Allergies  Allergen Reactions   Chlorhexidine Itching   Poison Oak Extract [Poison Oak Extract]     Ramipril Other (See Comments)    IRREGULAR HEART BEAT   Penicillins Rash    Has patient had a PCN reaction causing immediate rash, facial/tongue/throat swelling, SOB or lightheadedness with hypotension: Yes Has patient had a PCN reaction causing severe rash involving mucus membranes or skin necrosis: Yes Has patient had a PCN reaction that required hospitalization No Has patient had a PCN reaction occurring within the last 10 years: No If all of the above answers are "NO", then may proceed with Cephalosporin use.    Sulfa Antibiotics Rash    PAST MEDICAL HISTORY Past Medical History:  Diagnosis Date   Diabetes mellitus without complication ([place])    Dyspnea    Gallbladder calculus    GERD (gastroesophageal reflux disease)    Hyperlipidemia    Hypertension    Hypertensive retinopathy    OU   Kidney stone    Macular degeneration    Dry OU   Mass    Parotid swelling    UTI (lower urinary tract infection)    Past Surgical History:  Procedure Laterality Date   CATARACT EXTRACTION Bilateral 02/2020   Dr. Vevelyn Royals   CHOLECYSTECTOMY N/A 04/30/2016   Procedure: CHOLECYSTECTOMY;  Surgeon: Jules Husbands, MD;  Location: [facility] ORS;  Service: General;  Laterality: N/A;   DIAGNOSTIC LAPAROSCOPIC LIVER BIOPSY  02/28/2016   Procedure: DIAGNOSTIC LAPAROSCOPIC LIVER BIOPSY;  Surgeon: Jules Husbands, MD;  Location: [facility] ORS;  Service: General;;   ERCP N/A 01/13/2016   Procedure: ENDOSCOPIC RETROGRADE CHOLANGIOPANCREATOGRAPHY (ERCP);  Surgeon: Lucilla Lame, MD;  Location[facility] END[facility]COPY;  Service: Endoscopy;  Laterality: N/A;   ERCP N/A 04/08/2016   Procedure: ENDOSCOPIC RETROGRADE CHOLANGIOPANCREATOGRAPHY (ERCP) Stent removal;  Surgeon: Lucilla Lame, MD;  Location: [facility] ENDOSCOPY;  Service: Endoscopy;  Laterality: N/A;   EYE SURGERY Bilateral 02/2020   Cat Sx - Dr. Vevelyn Royals   INTRAOPERATIVE CHOLANGIOGRAM  04/30/2016   Procedure: INTRAOPERATIVE CHOLANGIOGRAM;  Surgeon: Jules Husbands, MD;  Location: [facility] ORS;  Service: General;;   KNEE ARTHROSCOPY Left    LAPAROSCOPY  02/28/2016   Procedure: Diagnotic laparoscopy with omental biopsy;  Surgeon: Jules Husbands, MD;  Location: [facility] ORS;  Service: General;;   TONSILLECTOMY      FAMILY HISTORY Family History  Problem Relation Age of Onset   Cancer Father    Heart attack Father    Cancer Brother    Diabetes Brother    Diabetes Son    Breast cancer Neg Hx     SOCIAL HISTORY Social History   Tobacco Use   Smoking status: Never   Smokeless tobacco: Never  Vaping Use   Vaping Use: Never used  Substance Use Topics   Alcohol use: No   Drug use: No       OPHTHALMIC EXAM:  Base Eye Exam     Visual Acuity (Snellen - Linear)       Right Left   Dist cc 20/50 +  2 20/50 -2   Dist ph cc NI NI    Correction: Glasses         Tonometry (Tonopen, 8:03 AM)       Right Left   Pressure 11 10         Pupils       Dark Light Shape React APD   Right 4 3 Round Brisk None   Left 4 3 Round Brisk None         Visual Fields (Counting fingers)       Left Right    Full Full         Extraocular Movement       Right Left    Full, Ortho Full, Ortho         Neuro/Psych     Oriented x3: Yes   Mood/Affect: Normal         Dilation     Both eyes: 1.0% Mydriacyl, 2.5% Phenylephrine @ 8:04 AM           Slit Lamp and Fundus Exam     Slit Lamp Exam       Right Left   Lids/Lashes Dermatochalasis - upper lid Dermatochalasis - upper lid, upper lid swelling   Conjunctiva/Sclera Conjunctivochalasis Conjunctivochalasis inferior   Cornea early band K nasal and temporal extending centrally, 2+ PEE centrally, arcus, well healed temporal cataract wounds early band K nasal and temporal extending centrally, 2+PEE, arcus, well healed temporal cataract wounds, irregular epi   Anterior Chamber Deep and quiet Deep and quiet   Iris Round and dilated Round and dilated   Lens PC IOL in good position, 1+  Posterior capsular opacification PC IOL in good position, trace Posterior capsular opacification   Anterior Vitreous Vitreous syneresis, Posterior vitreous detachment Vitreous syneresis, Posterior vitreous detachment         Fundus Exam       Right Left   Disc mild pallor, Sharp rim, +Peripapillary atrophy Pink and Sharp, temporal Peripapillary atrophy   C/D Ratio 0.5 0.4   Macula Flat, Blunted foveal reflex, RPE mottling, clumping and atrophy centrally, No heme or edema, ?vitelliform like lesion Flat, Blunted foveal reflex, RPE mottling, clumping and atrophy greatest centrally, No heme or edema   Vessels attenuated, Tortuous attenuated, Tortuous   Periphery Attached, reticular degeneration, No heme  Attached, reticular degeneration, No heme            Refraction     Wearing Rx       Sphere Cylinder Axis Add   Right -1.00 +1.25 168 +3.00   Left -1.00 +1.00 180 +3.00           IMAGING AND PROCEDURES  Imaging and Procedures for 05/22/2021  OCT, Retina - OU - Both Eyes       Right Eye Quality was good. Central Foveal Thickness: 247. Progression has been stable. Findings include normal foveal contour, no IRF, subretinal hyper-reflective material, retinal drusen , subretinal fluid (persistent focal central SRHM -- vitelliform like lesion).   Left Eye Quality was good. Central Foveal Thickness: 247. Progression has improved. Findings include normal foveal contour, subretinal hyper-reflective material, subretinal fluid, no IRF (focal central SRF / SRHM -- slightly improved).   Notes *Images captured and stored on drive  Diagnosis / Impression:  NFP, no IRF, +SRF OU OD: persistent focal central SRHM OS: focal central SRF / SRHM -- slightly improved  Clinical management:  See below  Abbreviations: NFP - Normal foveal profile. CME - cystoid macular  edema. PED - pigment epithelial detachment. IRF - intraretinal fluid. SRF - subretinal fluid. EZ - ellipsoid zone. ERM -  epiretinal membrane. ORA - outer retinal atrophy. ORT - outer retinal tubulation. SRHM - subretinal hyper-reflective material. IRHM - intraretinal hyper-reflective material            ASSESSMENT/PLAN:    ICD-10-CM   1. Intermediate stage nonexudative age-related macular degeneration of both eyes  H35.3132 OCT, Retina - OU - Both Eyes    2. Diabetes mellitus type 2 without retinopathy ([place])  E11.9     3. Essential hypertension  I10     4. Hypertensive retinopathy of both eyes  H35.033     5. Pseudophakia of both eyes  Z96.1     6. Band keratopathy of both eyes  H18.423      1. Age related macular degeneration, non-exudative, both eyes  - The incidence, anatomy, and pathology of dry AMD, risk of progression, and the AREDS and AREDS 2 studies including smoking risks discussed with patient.  - OCT shows OD: persistent focal central SRHM; OS: Persistent in focal central SRF / SRHM  - Recommend Amsler Grid monitoring  - f/u 4 months-- DFE, OCT  2. Diabetes mellitus, type 2 without retinopathy - The incidence, risk factors for progression, natural history and treatment options for diabetic retinopathy  were discussed with patient.   - The need for close monitoring of blood glucose, blood pressure, and serum lipids, avoiding cigarette or any type of tobacco, and the need for long term follow up was also discussed with patient. - f/u in 1 year, sooner prn  3,4. Hypertensive retinopathy OU - discussed importance of tight BP control - monitor  5. Pseudophakia OU  - s/p CE/IOL w/Dr. Lucita Ferrara [phone]  - IOL in good position, doing well - monitor  6. Band keratopathy OU  - mild increase in central dryness OU  Ophthalmic Meds Ordered this visit:  No orders of the defined types were placed in this encounter.    Return in about 4 months (around 09/20/2021) for f/u non-exu ARMD OU, DFE, OCT.  There are no Patient Instructions on file for this visit.  This document serves as a  record of services personally performed by Gardiner Sleeper, MD, PhD. It was created on their behalf by Orvan Falconer, an ophthalmic technician. The creation of this record is the provider's dictation and/or activities during the visit.    Electronically signed by: Orvan Falconer, OA, 05/22/21  1:02 PM   This document serves as a record of services personally performed by Gardiner Sleeper, MD, PhD. It was created on their behalf by San Jetty. Owens Shark, OA an ophthalmic technician. The creation of this record is the provider's dictation and/or activities during the visit.    Electronically signed by: San Jetty. Owens Shark, [place] 12.28.2022 1:02 PM  Gardiner Sleeper, M.D., Ph.D. Diseases & Surgery of the Retina and Vitreous [facility]  I have reviewed the above documentation for accuracy and completeness, and I agree with the above. Gardiner Sleeper, M.D., Ph.D. 05/22/21 1:03 PM   Abbreviations: M myopia (nearsighted); A astigmatism; H hyperopia (farsighted); P presbyopia; Mrx spectacle prescription;  CTL contact lenses; OD right eye; OS left eye; OU both eyes  XT exotropia; ET esotropia; PEK punctate epithelial keratitis; PEE punctate epithelial erosions; DES dry eye syndrome; MGD meibomian gland dysfunction; ATs artificial tears; PFAT's preservative free artificial tears; [place] nuclear sclerotic cataract; PSC posterior subcapsular cataract; ERM epi-retinal  membrane; PVD posterior vitreous detachment; RD retinal detachment; DM diabetes mellitus; DR diabetic retinopathy; NPDR non-proliferative diabetic retinopathy; PDR proliferative diabetic retinopathy; CSME clinically significant macular edema; DME diabetic macular edema; dbh dot blot hemorrhages; CWS cotton wool spot; POAG primary open angle glaucoma; C/D cup-to-disc ratio; HVF humphrey visual field; GVF goldmann visual field; OCT optical coherence tomography; IOP intraocular pressure; BRVO Branch retinal vein occlusion; CRVO central  retinal vein occlusion; CRAO central retinal artery occlusion; BRAO branch retinal artery occlusion; RT retinal tear; SB scleral buckle; PPV pars plana vitrectomy; VH Vitreous hemorrhage; PRP panretinal laser photocoagulation; IVK intravitreal kenalog; VMT vitreomacular traction; MH Macular hole;  NVD neovascularization of the disc; NVE neovascularization elsewhere; AREDS age related eye disease study; ARMD age related macular degeneration; POAG primary open angle glaucoma; EBMD epithelial/anterior basement membrane dystrophy; ACIOL anterior chamber intraocular lens; IOL intraocular lens; PCIOL posterior chamber intraocular lens; Phaco/IOL phacoemulsification with intraocular lens placement; [place] photorefractive keratectomy; LASIK laser assisted in situ keratomileusis; HTN hypertension; DM diabetes mellitus; COPD chronic obstructive pulmonary disease

## 2021-05-22 ENCOUNTER — Other Ambulatory Visit: Payer: Self-pay

## 2021-05-22 ENCOUNTER — Ambulatory Visit (INDEPENDENT_AMBULATORY_CARE_PROVIDER_SITE_OTHER): Payer: Medicare Other | Admitting: Ophthalmology

## 2021-05-22 ENCOUNTER — Encounter (INDEPENDENT_AMBULATORY_CARE_PROVIDER_SITE_OTHER): Payer: Self-pay | Admitting: Ophthalmology

## 2021-05-22 DIAGNOSIS — Z961 Presence of intraocular lens: Secondary | ICD-10-CM | POA: Diagnosis not present

## 2021-05-22 DIAGNOSIS — H35033 Hypertensive retinopathy, bilateral: Secondary | ICD-10-CM

## 2021-05-22 DIAGNOSIS — H18423 Band keratopathy, bilateral: Secondary | ICD-10-CM | POA: Diagnosis not present

## 2021-05-22 DIAGNOSIS — I10 Essential (primary) hypertension: Secondary | ICD-10-CM | POA: Diagnosis not present

## 2021-05-22 DIAGNOSIS — H353132 Nonexudative age-related macular degeneration, bilateral, intermediate dry stage: Secondary | ICD-10-CM | POA: Diagnosis not present

## 2021-05-22 DIAGNOSIS — E119 Type 2 diabetes mellitus without complications: Secondary | ICD-10-CM

## 2021-06-04 DIAGNOSIS — K1121 Acute sialoadenitis: Secondary | ICD-10-CM | POA: Diagnosis not present

## 2021-06-04 DIAGNOSIS — H90A32 Mixed conductive and sensorineural hearing loss, unilateral, left ear with restricted hearing on the contralateral side: Secondary | ICD-10-CM | POA: Diagnosis not present

## 2021-06-25 DIAGNOSIS — H905 Unspecified sensorineural hearing loss: Secondary | ICD-10-CM | POA: Diagnosis not present

## 2021-07-03 ENCOUNTER — Other Ambulatory Visit: Payer: Self-pay | Admitting: Internal Medicine

## 2021-07-03 ENCOUNTER — Other Ambulatory Visit: Payer: Self-pay | Admitting: *Deleted

## 2021-07-03 MED ORDER — SIMVASTATIN 40 MG PO TABS: 40.0000 mg | ORAL_TABLET | Freq: Every day | ORAL | 3 refills | Status: DC

## 2021-07-04 ENCOUNTER — Encounter: Payer: Self-pay | Admitting: Internal Medicine

## 2021-08-21 ENCOUNTER — Ambulatory Visit (INDEPENDENT_AMBULATORY_CARE_PROVIDER_SITE_OTHER): Payer: Medicare Other | Admitting: Internal Medicine

## 2021-08-21 ENCOUNTER — Other Ambulatory Visit: Payer: Self-pay

## 2021-08-21 ENCOUNTER — Encounter: Payer: Self-pay | Admitting: Internal Medicine

## 2021-08-21 VITALS — BP 140/80 | HR 61 | Ht 69.0 in | Wt 263.6 lb

## 2021-08-21 DIAGNOSIS — E119 Type 2 diabetes mellitus without complications: Secondary | ICD-10-CM | POA: Diagnosis not present

## 2021-08-21 DIAGNOSIS — I10 Essential (primary) hypertension: Secondary | ICD-10-CM

## 2021-08-21 DIAGNOSIS — K219 Gastro-esophageal reflux disease without esophagitis: Secondary | ICD-10-CM | POA: Diagnosis not present

## 2021-08-21 DIAGNOSIS — E785 Hyperlipidemia, unspecified: Secondary | ICD-10-CM

## 2021-08-21 NOTE — Progress Notes (Signed)
? ?Established Patient Office Visit ? ?Subjective:  ?Patient ID: NECHA HARRIES, female    DOB: 02/08/1931  Age: 88 y.o. MRN: [id number] ? ?CC:  ?Chief Complaint  ?Patient presents with  ? URI  ?  Patient complains of uri symptoms, patient has been taking robitussin for her cough.  ? ? ?URI  ?This is a recurrent problem. The current episode started 1 to 4 weeks ago. The problem has been gradually improving. There has been no fever. Associated symptoms include congestion, coughing and joint swelling. Pertinent negatives include no abdominal pain, chest pain, neck pain or rash.  ? ?ARDYN FORGE presents for uri ? ?Past Medical History:  ?Diagnosis Date  ? Diabetes mellitus without complication ([place])   ? Dyspnea   ? Gallbladder calculus   ? GERD (gastroesophageal reflux disease)   ? Hyperlipidemia   ? Hypertension   ? Hypertensive retinopathy   ? OU  ? Kidney stone   ? Macular degeneration   ? Dry OU  ? Mass   ? Parotid swelling   ? UTI (lower urinary tract infection)   ? ? ?Past Surgical History:  ?Procedure Laterality Date  ? CATARACT EXTRACTION Bilateral 02/2020  ? Dr. Vevelyn Royals  ? CHOLECYSTECTOMY N/A 04/30/2016  ? Procedure: CHOLECYSTECTOMY;  Surgeon: Jules Husbands, MD;  Location: [facility] ORS;  Service: General;  Laterality: N/A;  ? DIAGNOSTIC LAPAROSCOPIC LIVER BIOPSY  02/28/2016  ? Procedure: DIAGNOSTIC LAPAROSCOPIC LIVER BIOPSY;  Surgeon: Jules Husbands, MD;  Location: [facility] ORS;  Service: General;;  ? ERCP N/A 01/13/2016  ? Procedure: ENDOSCOPIC RETROGRADE CHOLANGIOPANCREATOGRAPHY (ERCP);  Surgeon: Lucilla Lame, MD;  Location: [facility] ENDOSCOPY;  Service: Endoscopy;  Laterality: N/A;  ? ERCP N/A 04/08/2016  ? Procedure: ENDOSCOPIC RETROGRADE CHOLANGIOPANCREATOGRAPHY (ERCP) Stent removal;  Surgeon: Lucilla Lame, MD;  Location: [facility] ENDOSCOPY;  Service: Endoscopy;  Laterality: N/A;  ? EYE SURGERY Bilateral 02/2020  ? Cat Sx - Dr. Vevelyn Royals  ? INTRAOPERATIVE CHOLANGIOGRAM  04/30/2016  ? Procedure:  INTRAOPERATIVE CHOLANGIOGRAM;  Surgeon: Jules Husbands, MD;  Location: [facility] ORS;  Service: General;;  ? KNEE ARTHROSCOPY Left   ? LAPAROSCOPY  02/28/2016  ? Procedure: Diagnotic laparoscopy with omental biopsy;  Surgeon: Jules Husbands, MD;  Location: [facility] ORS;  Service: General;;  ? TONSILLECTOMY    ? ? ?Family History  ?Problem Relation Age of Onset  ? Cancer Father   ? Heart attack Father   ? Cancer Brother   ? Diabetes Brother   ? Diabetes Son   ? Breast cancer Neg Hx   ? ? ?Social History  ? ?Socioeconomic History  ? Marital status: Widowed  ?  Spouse name: Not on file  ? Number of children: Not on file  ? Years of education: Not on file  ? Highest education level: Not on file  ?Occupational History  ? Not on file  ?Tobacco Use  ? Smoking status: Never  ? Smokeless tobacco: Never  ?Vaping Use  ? Vaping Use: Never used  ?Substance and Sexual Activity  ? Alcohol use: No  ? Drug use: No  ? Sexual activity: Not Currently  ?Other Topics Concern  ? Not on file  ?Social History Narrative  ? Not on file  ? ?Social Determinants of Health  ? ?Financial Resource Strain: Low Risk   ? Difficulty of Paying Living Expenses: Not very hard  ?Food Insecurity: No Food Insecurity  ? Worried About Charity fundraiser in the Last Year: Never true  ?  Ran Out of Food in the Last Year: Never true  ?Transportation Needs: No Transportation Needs  ? Lack of Transportation (Medical): No  ? Lack of Transportation (Non-Medical): No  ?Physical Activity: Inactive  ? Days of Exercise per Week: 0 days  ? Minutes of Exercise per Session: 0 min  ?Stress: No Stress Concern Present  ? Feeling of Stress : Not at all  ?Social Connections: Socially Isolated  ? Frequency of Communication with Friends and Family: More than three times a week  ? Frequency of Social Gatherings with Friends and Family: More than three times a week  ? Attends Religious Services: Never  ? Active Member of Clubs or Organizations: No  ? Attends Archivist Meetings: Never   ? Marital Status: Widowed  ?Intimate Partner Violence: Not At Risk  ? Fear of Current or Ex-Partner: No  ? Emotionally Abused: No  ? Physically Abused: No  ? Sexually Abused: No  ? ? ? ?Current Outpatient Medications:  ?  acetaminophen (TYLENOL) 500 MG tablet, Take 500 mg by mouth every 6 (six) hours as needed for mild pain., Disp: , Rfl:  ?  aspirin EC 81 MG tablet, Take 81 mg by mouth daily., Disp: , Rfl:  ?  calcium carbonate (OS-CAL) 1250 (500 Ca) MG chewable tablet, Chew by mouth. (Patient not taking: Reported on 05/22/2021), Disp: , Rfl:  ?  calcium carbonate (TUMS - DOSED IN MG ELEMENTAL CALCIUM) 500 MG chewable tablet, Chew 2 tablets by mouth daily as needed for indigestion or heartburn. (Patient not taking: Reported on 05/22/2021), Disp: , Rfl:  ?  carboxymethylcellulose (REFRESH PLUS) 0.5 % SOLN, 1 drop 3 (three) times daily as needed., Disp: , Rfl:  ?  clotrimazole-betamethasone (LOTRISONE) cream, APP EXT AA BID, Disp: , Rfl:  ?  conjugated estrogens (PREMARIN) vaginal cream, Place 1 Applicatorful vaginally 2 (two) times a week. 1 gram vaginally at bedtime twice weekly, Disp: 30 g, Rfl: 3 ?  glimepiride (AMARYL) 4 MG tablet, TAKE 1 TABLET(4 MG) BY MOUTH DAILY BEFORE BREAKFAST, Disp: 30 tablet, Rfl: 3 ?  losartan (COZAAR) 100 MG tablet, TAKE 1 TABLET BY MOUTH DAILY, Disp: 30 tablet, Rfl: 6 ?  metoprolol succinate (TOPROL-XL) 100 MG 24 hr tablet, TAKE 1 TABLET BY MOUTH DAILY, Disp: 30 tablet, Rfl: 3 ?  metoprolol succinate (TOPROL-XL) 100 MG 24 hr tablet, TAKE 1 TABLET BY MOUTH DAILY, Disp: 30 tablet, Rfl: 3 ?  metoprolol succinate (TOPROL-XL) 100 MG 24 hr tablet, TAKE 1 TABLET BY MOUTH DAILY, Disp: 30 tablet, Rfl: 3 ?  Multiple Vitamins-Minerals (PRESERVISION AREDS 2 PO), Take by mouth., Disp: , Rfl:  ?  nystatin-triamcinolone ointment (MYCOLOG), Apply 1 application topically 2 (two) times daily., Disp: 30 g, Rfl: 0 ?  omeprazole (PRILOSEC) 20 MG capsule, Take by mouth., Disp: , Rfl:  ?  ondansetron  (ZOFRAN ODT) 4 MG disintegrating tablet, Take 1 tablet (4 mg total) by mouth every 8 (eight) hours as needed for nausea or vomiting. (Patient not taking: Reported on 05/22/2021), Disp: 20 tablet, Rfl: 0 ?  ONE TOUCH ULTRA TEST test strip, , Disp: , Rfl: 0 ?  ONETOUCH ULTRA test strip, USE AS DIRECTED TO TEST BLOOD SUGAR, Disp: 100 strip, Rfl: 6 ?  pantoprazole (PROTONIX) 40 MG tablet, TAKE 1 TABLET(40 MG) BY MOUTH DAILY, Disp: 30 tablet, Rfl: 6 ?  phenazopyridine (PYRIDIUM) 95 MG tablet, Take 1 tablet (95 mg total) by mouth 3 (three) times daily as needed for pain. (Patient not taking: Reported on  05/22/2021), Disp: 90 tablet, Rfl: 0 ?  Phenazopyridine HCl (AZO TABS PO), Take by mouth. (Patient not taking: Reported on 05/22/2021), Disp: , Rfl:  ?  simvastatin (ZOCOR) 40 MG tablet, Take 1 tablet (40 mg total) by mouth daily., Disp: 90 tablet, Rfl: 3  ? ?Allergies  ?Allergen Reactions  ? Chlorhexidine Itching  ? [place] Extract]   ? Ramipril Other (See Comments)  ?  IRREGULAR HEART BEAT  ? Penicillins Rash  ?  Has patient had a PCN reaction causing immediate rash, facial/tongue/throat swelling, SOB or lightheadedness with hypotension: Yes ?Has patient had a PCN reaction causing severe rash involving mucus membranes or skin necrosis: Yes ?Has patient had a PCN reaction that required hospitalization No ?Has patient had a PCN reaction occurring within the last 10 years: No ?If all of the above answers are "NO", then may proceed with Cephalosporin use. ?  ? Sulfa Antibiotics Rash  ? ? ?ROS ?Review of Systems  ?HENT:  Positive for congestion.   ?Respiratory:  Positive for cough.   ?Cardiovascular:  Negative for chest pain.  ?Gastrointestinal:  Negative for abdominal pain.  ?Musculoskeletal:  Negative for neck pain.  ?Skin:  Negative for rash.  ? ?  ?Objective:  ?  ?Physical Exam ?Vitals reviewed.  ?Constitutional:   ?   Appearance: Normal appearance.  ?HENT:  ?   Mouth/Throat:  ?   Mouth: Mucous  membranes are moist.  ?Eyes:  ?   Pupils: Pupils are equal, round, and reactive to light.  ?Neck:  ?   Vascular: No carotid bruit.  ?Cardiovascular:  ?   Rate and Rhythm: Normal rate and regular rhythm.  ?   Pulses: N

## 2021-08-21 NOTE — Assessment & Plan Note (Signed)
Reflexes stable at the present time ?

## 2021-08-21 NOTE — Assessment & Plan Note (Signed)

## 2021-08-21 NOTE — Assessment & Plan Note (Signed)
Hypercholesterolemia  I advised the patient to follow Mediterranean diet This diet is rich in fruits vegetables and whole grain, and This diet is also rich in fish and lean meat Patient should also eat a handful of almonds or walnuts daily Recent heart study indicated that average follow-up on this kind of diet reduces the cardiovascular mortality by 50 to 70%== 

## 2021-08-21 NOTE — Assessment & Plan Note (Signed)
Patient was advised to lose weight 

## 2021-09-18 ENCOUNTER — Ambulatory Visit: Payer: Medicare Other | Admitting: Dermatology

## 2021-09-18 DIAGNOSIS — D229 Melanocytic nevi, unspecified: Secondary | ICD-10-CM | POA: Diagnosis not present

## 2021-09-18 DIAGNOSIS — Z1283 Encounter for screening for malignant neoplasm of skin: Secondary | ICD-10-CM | POA: Diagnosis not present

## 2021-09-18 DIAGNOSIS — L304 Erythema intertrigo: Secondary | ICD-10-CM

## 2021-09-18 DIAGNOSIS — D1800 Hemangioma unspecified site: Secondary | ICD-10-CM

## 2021-09-18 DIAGNOSIS — L578 Other skin changes due to chronic exposure to nonionizing radiation: Secondary | ICD-10-CM | POA: Diagnosis not present

## 2021-09-18 DIAGNOSIS — L814 Other melanin hyperpigmentation: Secondary | ICD-10-CM

## 2021-09-18 DIAGNOSIS — L821 Other seborrheic keratosis: Secondary | ICD-10-CM | POA: Diagnosis not present

## 2021-09-18 DIAGNOSIS — L820 Inflamed seborrheic keratosis: Secondary | ICD-10-CM

## 2021-09-18 MED ORDER — KETOCONAZOLE 2 % EX CREA: TOPICAL_CREAM | CUTANEOUS | 3 refills | Status: DC

## 2021-09-18 NOTE — Addendum Note (Signed)
Addended by: Brendolyn Patty on: 09/18/2021 06:42 PM ? ? Modules accepted: Level of Service ? ?

## 2021-09-18 NOTE — Patient Instructions (Addendum)
Start Ketoconazole 2% Cream - Apply to skin folds once to twice daily for rash. ?Start Zeasorb AF Powder (over-the-counter) - Apply to skin fold areas once a day after shower.  ? ?Cryotherapy Aftercare ? ?Wash gently with soap and water everyday.   ?Apply Vaseline and Band-Aid daily until healed.  ? ? ?Seborrheic Keratosis ? ?What causes seborrheic keratoses? ?Seborrheic keratoses are harmless, common skin growths that first appear during adult life.  As time goes by, more growths appear.  Some people may develop a large number of them.  Seborrheic keratoses appear on both covered and uncovered body parts.  They are not caused by sunlight.  The tendency to develop seborrheic keratoses can be inherited.  They vary in color from skin-colored to gray, brown, or even black.  They can be either smooth or have a rough, warty surface.   ?Seborrheic keratoses are superficial and look as if they were stuck on the skin.  Under the microscope this type of keratosis looks like layers upon layers of skin.  That is why at times the top layer may seem to fall off, but the rest of the growth remains and re-grows.   ? ?Treatment ?Seborrheic keratoses do not need to be treated, but can easily be removed in the office.  Seborrheic keratoses often cause symptoms when they rub on clothing or jewelry.  Lesions can be in the way of shaving.  If they become inflamed, they can cause itching, soreness, or burning.  Removal of a seborrheic keratosis can be accomplished by freezing, burning, or surgery. ?If any spot bleeds, scabs, or grows rapidly, please return to have it checked, as these can be an indication of a skin cancer. ? ?If You Need Anything After Your Visit ? ?If you have any questions or concerns for your doctor, please call our main line at [phone] and press option 4 to reach your doctor's medical assistant. If no one answers, please leave a voicemail as directed and we will return your call as soon as possible. Messages  left after 4 pm will be answered the following business day.  ? ?You may also send [place] a message via MyChart. We typically respond to MyChart messages within 1-2 business days. ? ?For prescription refills, please ask your pharmacy to contact our office. Our fax number is [phone]. ? ?If you have an urgent issue when the clinic is closed that cannot wait until the next business day, you can page your doctor at the number below.   ? ?Please note that while we do our best to be available for urgent issues outside of office hours, we are not available 24/7.  ? ?If you have an urgent issue and are unable to reach [place], you may choose to seek medical care at your doctor's office, retail clinic, urgent care center, or emergency room. ? ?If you have a medical emergency, please immediately call 911 or go to the emergency department. ? ?Pager Numbers ? ?- Dr. Nehemiah Massed: [phone] ? ?- Dr. Laurence Ferrari: [phone] ? ?- Dr. Nicole Kindred: [phone] ? ?In the event of inclement weather, please call our main line at [phone] for an update on the status of any delays or closures. ? ?Dermatology Medication Tips: ?Please keep the boxes that topical medications come in in order to help keep track of the instructions about where and how to use these. Pharmacies typically print the medication instructions only on the boxes and not directly on the medication tubes.  ? ?If your medication is too  expensive, please contact our office at [phone] option 4 or send [place] a message through [place].  ? ?We are unable to tell what your co-pay for medications will be in advance as this is different depending on your insurance coverage. However, we may be able to find a substitute medication at lower cost or fill out paperwork to get insurance to cover a needed medication.  ? ?If a prior authorization is required to get your medication covered by your insurance company, please allow [place] 1-2 business days to complete this process. ? ?Drug prices  often vary depending on where the prescription is filled and some pharmacies may offer cheaper prices. ? ?The website www.goodrx.com contains coupons for medications through different pharmacies. The prices here do not account for what the cost may be with help from insurance (it may be cheaper with your insurance), but the website can give you the price if you did not use any insurance.  ?- You can print the associated coupon and take it with your prescription to the pharmacy.  ?- You may also stop by our office during regular business hours and pick up a GoodRx coupon card.  ?- If you need your prescription sent electronically to a different pharmacy, notify our office through [facility] or by phone at [phone] option 4. ? ? ? ? ?Si Usted Necesita Algo Despu?s de Su Visita ? ?Tambi?n puede enviarnos un mensaje a trav?s de MyChart. Por lo general respondemos a los mensajes de MyChart en el transcurso de 1 a 2 d?as h?biles. ? ?Para renovar recetas, por favor pida a su farmacia que se ponga en contacto con nuestra oficina. Nuestro n?mero de fax es el [phone]. ? ?Si tiene un asunto urgente cuando la cl?nica est? cerrada y que no puede esperar hasta el siguiente d?a h?bil, puede llamar/localizar a su doctor(a) al n?mero que aparece a continuaci?n.  ? ?Por favor, tenga en cuenta que aunque hacemos todo lo posible para estar disponibles para asuntos urgentes fuera del horario de oficina, no estamos disponibles las 24 horas del d?a, los 7 d?as de la semana.  ? ?Si tiene un problema urgente y no puede comunicarse con nosotros, puede optar por buscar atenci?n m?dica  en el consultorio de su doctor(a), en una cl?nica privada, en un centro de atenci?n urgente o en una sala de emergencias. ? ?Si tiene Engineer, maintenance (IT) m?dica, por favor llame inmediatamente al 911 o vaya a la sala de emergencias. ? ?N?meros de b?per ? ?- Dr. Nehemiah Massed: [phone] ? ?- Dra. Moye: [phone] ? ?- Dra. Nicole Kindred:  [phone] ? ?En caso de inclemencias del tiempo, por favor llame a nuestra l?nea principal al [phone] para una actualizaci?n sobre el estado de cualquier retraso o cierre. ? ?Consejos para la medicaci?n en dermatolog?a: ?Por favor, guarde las cajas en las que vienen los medicamentos de uso t?pico para ayudarle a seguir las instrucciones sobre d?nde y c?mo usarlos. Las farmacias generalmente imprimen las instrucciones del medicamento s?lo en las cajas y no directamente en los tubos del [place] [facility] [place].  ? ?Si su medicamento es muy caro, por favor, p?ngase en contacto con Zigmund Daniel llamando al [phone] y presione la opci?n 4 o env?enos un mensaje a trav?s de MyChart.  ? ?No podemos decirle cu?l ser? su copago por los medicamentos por adelantado ya que esto es diferente dependiendo de la cobertura de su seguro. Sin embargo, es posible que podamos encontrar un medicamento sustituto a Electrical engineer un formulario para que el seguro  cubra el medicamento que se considera necesario.  ? ?Si se requiere Ardelia Mems autorizaci?n previa para que su compa??a de seguros [place] su medicamento, por favor perm?tanos de 1 a 2 d?as h?biles para completar este proceso. ? ?Los precios de los medicamentos var?an con frecuencia dependiendo del Environmental consultant de d?nde se surte la receta y alguna farmacias pueden ofrecer precios m?s baratos. ? ?El sitio web www.goodrx.com tiene cupones para medicamentos de Airline pilot. Los precios aqu? no tienen en cuenta lo que podr?a costar con la ayuda del seguro (puede ser m?s barato con su seguro), pero el sitio web puede darle el precio si no utiliz? ning?n seguro.  ?- Puede imprimir el cup?n correspondiente y llevarlo con su receta a la farmacia.  ?- Tambi?n puede pasar por nuestra oficina durante el horario de atenci?n regular y recoger una tarjeta de cupones de GoodRx.  ?- Si necesita que su receta se env?e electr?nicamente a Chiropodist, informe a nuestra oficina a trav?s de  MyChart de [place] o por tel?fono llamando al [phone] y presione la opci?n 4. ? ?

## 2021-09-18 NOTE — Progress Notes (Addendum)
? ?Follow-Up Visit ?  ?Subjective  ?Victoria Dalton is a 88 y.o. female who presents for the following: Follow-up. ? ?The patient presents for Upper Body Skin Exam (UBSE) for skin cancer screening and mole check.  The patient has spots, moles and lesions to be evaluated, some may be new or changing. She has several spots on her face, neck, and chest and back she would like checked. Some are irritated and rubbed, especially around her neck from her lifeline necklace. No history of skin cancer.  She has itchy rash under her breasts and groin.  She has tried multiple OTC antifungal cream without improvement. ? ?The following portions of the chart were reviewed this encounter and updated as appropriate:  ?  ?  ? ?Review of Systems:  No other skin or systemic complaints except as noted in HPI or Assessment and Plan. ? ?Objective  ?Well appearing patient in no apparent distress; mood and affect are within normal limits. ? ?All skin waist up examined. ? ?L post neck x 9, L clavicle x 1, R zygoma x 2, R neck x 1, R post upper arm x 1, L low back x 2 (16) ?Erythematous stuck-on, waxy papule ? ?Inframammary, inguinal creases ?Erythema BL inframammary, pt c/o of itching ? ? ? ?Assessment & Plan  ?Skin cancer screening performed today. ? ?Actinic Damage ?- chronic, secondary to cumulative UV radiation exposure/sun exposure over time ?- diffuse scaly erythematous macules with underlying dyspigmentation ?- Recommend daily broad spectrum sunscreen SPF 30+ to sun-exposed areas, reapply every 2 hours as needed.  ?- Recommend staying in the shade or wearing long sleeves, sun glasses (UVA+UVB protection) and wide brim hats (4-inch brim around the entire circumference of the hat). ?- Call for new or changing lesions. ? ?Seborrheic Keratoses ?- Stuck-on, waxy, tan-brown papules and/or plaques  ?- Benign-appearing ?- Discussed benign etiology and prognosis. ?- Observe ?- Call for any changes ? ?Lentigines ?- Scattered tan macules ?-  Due to sun exposure ?- Benign-appering, observe ?- Recommend daily broad spectrum sunscreen SPF 30+ to sun-exposed areas, reapply every 2 hours as needed. ?- Call for any changes ? ?Inflamed seborrheic keratosis (16) ?L post neck x 9, L clavicle x 1, R zygoma x 2, R neck x 1, R post upper arm x 1, L low back x 2 ? ?Destruction of lesion - L post neck x 9, L clavicle x 1, R zygoma x 2, R neck x 1, R post upper arm x 1, L low back x 2 ? ?Destruction method: cryotherapy   ?Informed consent: discussed and consent obtained   ?Lesion destroyed using liquid nitrogen: Yes   ?Region frozen until ice ball extended beyond lesion: Yes   ?Outcome: patient tolerated procedure well with no complications   ?Post-procedure details: wound care instructions given   ?Additional details:  Prior to procedure, discussed risks of blister formation, small wound, skin dyspigmentation, or rare scar following cryotherapy. Recommend Vaseline ointment to treated areas while healing. ? ? ?Erythema intertrigo ?Inframammary, inguinal creases ? ?Chronic and persistent condition with duration or expected duration over one year. Condition is bothersome/symptomatic for patient. Currently flared.  ? ?Intertrigo is a chronic recurrent rash that occurs in skin fold areas that may be associated with friction; heat; moisture; yeast; fungus; and bacteria.  It is exacerbated by increased movement / activity; sweating; and higher atmospheric temperature. ? ?Start ketoconazole 2% cream Apply to skin folds qd/bid dsp  ? ?Start Zeasorb AF Powder to skin folds after shower  daily.  ? ?ketoconazole (NIZORAL) 2 % cream - Inframammary, inguinal creases ?Apply to skin folds once to twice daily for rash. ? ?Hemangiomas ?- Red papules, including left inframammary ?- Discussed benign nature ?- Observe ?- Call for any changes ? ?Melanocytic Nevi ?- Tan-brown and/or pink-flesh-colored symmetric macules and papules, including left malar cheek ?- Benign appearing on exam  today ?- Observation ?- Call clinic for new or changing moles ?- Recommend daily use of broad spectrum spf 30+ sunscreen to sun-exposed areas.  ? ?Return if symptoms worsen or fail to improve. ? ?I, Jamesetta Orleans, CMA, am acting as scribe for Brendolyn Patty, MD . ?Documentation: I have reviewed the above documentation for accuracy and completeness, and I agree with the above. ? ?Brendolyn Patty MD  ? ? ?

## 2021-09-20 ENCOUNTER — Encounter (INDEPENDENT_AMBULATORY_CARE_PROVIDER_SITE_OTHER): Payer: Medicare Other | Admitting: Ophthalmology

## 2021-09-25 NOTE — Progress Notes (Signed)
?[facility] Note ? ?09/30/2021 ? ?  ? ?CHIEF COMPLAINT ?Patient presents for Retina Follow Up ? ? ? ?HISTORY OF PRESENT ILLNESS: ?Victoria Dalton is a 88 y.o. female who presents to the clinic today for:  ?HPI   ? ? Retina Follow Up   ?Patient presents with  Dry AMD.  In both eyes.  This started years ago.  Severity is moderate.  Duration of 5.  Since onset it is stable.  I, the attending physician,  performed the HPI with the patient and updated documentation appropriately. ? ?  ?  ? ? Comments   ?Patient denies any vision changes at this time. She states that she is using AT's OU PRN and taking the AREDS. She has not been checking the [place].  Her blood sugar was 134 this morning, her A1C is 6.6. ? ?  ?  ?Last edited by Bernarda Caffey, MD on 10/02/2021  8:16 AM.  ?  ? ?Referring physician: ?Vevelyn Royals, MD ?[id number] ?[place], [place] [address] ? ?HISTORICAL INFORMATION:  ? ?Selected notes from the [facility] ?Referred by Dr. Lucita Ferrara ?LEE: 02/22/2020 ?BCVA: OD 20/70, OS 20/70 ?Ocular Hx- Mac cyst OU, Cataracts OU, Band Keratopathy OU, ARMD OU ?PMH- DM  ? ?CURRENT MEDICATIONS: ?Current Outpatient Medications (Ophthalmic Drugs)  ?Medication Sig  ? carboxymethylcellulose (REFRESH PLUS) 0.5 % SOLN 1 drop 3 (three) times daily as needed.  ? ?No current facility-administered medications for this visit. (Ophthalmic Drugs)  ? ?Current Outpatient Medications (Other)  ?Medication Sig  ? acetaminophen (TYLENOL) 500 MG tablet Take 500 mg by mouth every 6 (six) hours as needed for mild pain.  ? aspirin EC 81 MG tablet Take 81 mg by mouth daily.  ? calcium carbonate (OS-CAL) 1250 (500 Ca) MG chewable tablet Chew by mouth.  ? calcium carbonate (TUMS - DOSED IN MG ELEMENTAL CALCIUM) 500 MG chewable tablet Chew 2 tablets by mouth daily as needed for indigestion or heartburn.  ? clotrimazole-betamethasone (LOTRISONE) cream APP EXT AA BID  ? conjugated estrogens (PREMARIN)  vaginal cream Place 1 Applicatorful vaginally 2 (two) times a week. 1 gram vaginally at bedtime twice weekly  ? glimepiride (AMARYL) 4 MG tablet TAKE 1 TABLET(4 MG) BY MOUTH DAILY BEFORE BREAKFAST  ? ketoconazole (NIZORAL) 2 % cream Apply to skin folds once to twice daily for rash.  ? losartan (COZAAR) 100 MG tablet TAKE 1 TABLET BY MOUTH DAILY  ? metoprolol succinate (TOPROL-XL) 100 MG 24 hr tablet TAKE 1 TABLET BY MOUTH DAILY  ? metoprolol succinate (TOPROL-XL) 100 MG 24 hr tablet TAKE 1 TABLET BY MOUTH DAILY  ? Multiple Vitamins-Minerals (PRESERVISION AREDS 2 PO) Take by mouth.  ? nystatin-triamcinolone ointment (MYCOLOG) Apply 1 application topically 2 (two) times daily.  ? omeprazole (PRILOSEC) 20 MG capsule Take by mouth.  ? ondansetron (ZOFRAN ODT) 4 MG disintegrating tablet Take 1 tablet (4 mg total) by mouth every 8 (eight) hours as needed for nausea or vomiting.  ? ONE TOUCH ULTRA TEST test strip   ? ONETOUCH ULTRA test strip USE AS DIRECTED TO TEST BLOOD SUGAR  ? pantoprazole (PROTONIX) 40 MG tablet TAKE 1 TABLET(40 MG) BY MOUTH DAILY  ? phenazopyridine (PYRIDIUM) 95 MG tablet Take 1 tablet (95 mg total) by mouth 3 (three) times daily as needed for pain.  ? Phenazopyridine HCl (AZO TABS PO) Take by mouth.  ? simvastatin (ZOCOR) 40 MG tablet Take 1 tablet (40 mg total) by  mouth daily.  ? metoprolol succinate (TOPROL-XL) 100 MG 24 hr tablet TAKE 1 TABLET BY MOUTH DAILY  ? ?No current facility-administered medications for this visit. (Other)  ? ?REVIEW OF SYSTEMS: ?ROS   ?Positive for: Gastrointestinal, Endocrine, Eyes ?Negative for: Constitutional, Neurological, Skin, Genitourinary, Musculoskeletal, HENT, Cardiovascular, Respiratory, Psychiatric, Allergic/Imm, Heme/Lymph ?Last edited by Annie Paras, COT on 09/30/2021  9:30 AM.  ?  ? ?ALLERGIES ?Allergies  ?Allergen Reactions  ? Chlorhexidine Itching  ? [place] Extract]   ? Ramipril Other (See Comments)  ?  IRREGULAR HEART BEAT   ? Penicillins Rash  ?  Has patient had a PCN reaction causing immediate rash, facial/tongue/throat swelling, SOB or lightheadedness with hypotension: Yes ?Has patient had a PCN reaction causing severe rash involving mucus membranes or skin necrosis: Yes ?Has patient had a PCN reaction that required hospitalization No ?Has patient had a PCN reaction occurring within the last 10 years: No ?If all of the above answers are "NO", then may proceed with Cephalosporin use. ?  ? Sulfa Antibiotics Rash  ? ?PAST MEDICAL HISTORY ?Past Medical History:  ?Diagnosis Date  ? Diabetes mellitus without complication ([place])   ? Dyspnea   ? Gallbladder calculus   ? GERD (gastroesophageal reflux disease)   ? Hyperlipidemia   ? Hypertension   ? Hypertensive retinopathy   ? OU  ? Kidney stone   ? Macular degeneration   ? Dry OU  ? Mass   ? Parotid swelling   ? UTI (lower urinary tract infection)   ? ?Past Surgical History:  ?Procedure Laterality Date  ? CATARACT EXTRACTION Bilateral 02/2020  ? Dr. Vevelyn Royals  ? CHOLECYSTECTOMY N/A 04/30/2016  ? Procedure: CHOLECYSTECTOMY;  Surgeon: Jules Husbands, MD;  Location: [facility] ORS;  Service: General;  Laterality: N/A;  ? DIAGNOSTIC LAPAROSCOPIC LIVER BIOPSY  02/28/2016  ? Procedure: DIAGNOSTIC LAPAROSCOPIC LIVER BIOPSY;  Surgeon: Jules Husbands, MD;  Location: [facility] ORS;  Service: General;;  ? ERCP N/A 01/13/2016  ? Procedure: ENDOSCOPIC RETROGRADE CHOLANGIOPANCREATOGRAPHY (ERCP);  Surgeon: Lucilla Lame, MD;  Location: [facility] ENDOSCOPY;  Service: Endoscopy;  Laterality: N/A;  ? ERCP N/A 04/08/2016  ? Procedure: ENDOSCOPIC RETROGRADE CHOLANGIOPANCREATOGRAPHY (ERCP) Stent removal;  Surgeon: Lucilla Lame, MD;  Location: [facility] ENDOSCOPY;  Service: Endoscopy;  Laterality: N/A;  ? EYE SURGERY Bilateral 02/2020  ? Cat Sx - Dr. Vevelyn Royals  ? INTRAOPERATIVE CHOLANGIOGRAM  04/30/2016  ? Procedure: INTRAOPERATIVE CHOLANGIOGRAM;  Surgeon: Jules Husbands, MD;  Location: [facility] ORS;  Service: General;;  ? KNEE  ARTHROSCOPY Left   ? LAPAROSCOPY  02/28/2016  ? Procedure: Diagnotic laparoscopy with omental biopsy;  Surgeon: Jules Husbands, MD;  Location: [facility] ORS;  Service: General;;  ? TONSILLECTOMY    ? ?FAMILY HISTORY ?Family History  ?Problem Relation Age of Onset  ? Cancer Father   ? Heart attack Father   ? Cancer Brother   ? Diabetes Brother   ? Diabetes Son   ? Breast cancer Neg Hx   ? ?SOCIAL HISTORY ?Social History  ? ?Tobacco Use  ? Smoking status: Never  ? Smokeless tobacco: Never  ?Vaping Use  ? Vaping Use: Never used  ?Substance Use Topics  ? Alcohol use: No  ? Drug use: No  ?  ? ?  ?OPHTHALMIC EXAM: ? ?Base Eye Exam   ? ? Visual Acuity (Snellen - Linear)   ? ?   Right Left  ? Dist cc 20/40 +2 20/50 +2  ? Dist ph  cc NI NI  ? ? Correction: Glasses  ? ?  ?  ? ? Tonometry (Tonopen, 9:33 AM)   ? ?   Right Left  ? Pressure 13 14  ? ?  ?  ? ? Pupils   ? ?   Dark Light Shape React APD  ? Right 4 3 Round Brisk None  ? Left 4 3 Round Brisk None  ? ?  ?  ? ? Visual Fields (Counting fingers)   ? ?   Left Right  ?  Full Full  ? ?  ?  ? ? Extraocular Movement   ? ?   Right Left  ?  Full Full  ? ?  ?  ? ? Neuro/Psych   ? ? Oriented x3: Yes  ? Mood/Affect: Normal  ? ?  ?  ? ? Dilation   ? ? Both eyes: 2.5% Phenylephrine, 1.0% Mydriacyl @ 9:31 AM  ? ?  ?  ? ?  ? ?Slit Lamp and Fundus Exam   ? ? Slit Lamp Exam   ? ?   Right Left  ? Lids/Lashes Dermatochalasis - upper lid Dermatochalasis - upper lid, upper lid swelling  ? Conjunctiva/Sclera Conjunctivochalasis Conjunctivochalasis inferior  ? Cornea early band K nasal and temporal extending centrally, 2+ PEE centrally, arcus, well healed temporal cataract wounds early band K nasal and temporal extending centrally, 2+PEE, arcus, well healed temporal cataract wounds, irregular epi  ? Anterior Chamber Deep and quiet Deep and quiet  ? Iris Round and dilated Round and dilated  ? Lens PC IOL in good position, 1-2+ Posterior capsular opacification PC IOL in good position, 1+ Posterior  capsular opacification  ? Anterior Vitreous Vitreous syneresis, Posterior vitreous detachment Vitreous syneresis, Posterior vitreous detachment  ? ?  ?  ? ? Fundus Exam   ? ?   Right Left  ? Disc mild pallor, Bobbye Riggs

## 2021-09-30 ENCOUNTER — Encounter (INDEPENDENT_AMBULATORY_CARE_PROVIDER_SITE_OTHER): Payer: Self-pay | Admitting: Ophthalmology

## 2021-09-30 ENCOUNTER — Ambulatory Visit (INDEPENDENT_AMBULATORY_CARE_PROVIDER_SITE_OTHER): Payer: Medicare Other | Admitting: Ophthalmology

## 2021-09-30 DIAGNOSIS — I10 Essential (primary) hypertension: Secondary | ICD-10-CM | POA: Diagnosis not present

## 2021-09-30 DIAGNOSIS — H18423 Band keratopathy, bilateral: Secondary | ICD-10-CM | POA: Diagnosis not present

## 2021-09-30 DIAGNOSIS — H25813 Combined forms of age-related cataract, bilateral: Secondary | ICD-10-CM

## 2021-09-30 DIAGNOSIS — H353132 Nonexudative age-related macular degeneration, bilateral, intermediate dry stage: Secondary | ICD-10-CM | POA: Diagnosis not present

## 2021-09-30 DIAGNOSIS — H35033 Hypertensive retinopathy, bilateral: Secondary | ICD-10-CM | POA: Diagnosis not present

## 2021-09-30 DIAGNOSIS — Z961 Presence of intraocular lens: Secondary | ICD-10-CM | POA: Diagnosis not present

## 2021-09-30 DIAGNOSIS — E119 Type 2 diabetes mellitus without complications: Secondary | ICD-10-CM

## 2021-10-01 ENCOUNTER — Other Ambulatory Visit: Payer: Self-pay | Admitting: Internal Medicine

## 2021-10-02 ENCOUNTER — Encounter (INDEPENDENT_AMBULATORY_CARE_PROVIDER_SITE_OTHER): Payer: Self-pay | Admitting: Ophthalmology

## 2021-10-22 ENCOUNTER — Ambulatory Visit (INDEPENDENT_AMBULATORY_CARE_PROVIDER_SITE_OTHER): Payer: Medicare Other | Admitting: Internal Medicine

## 2021-10-22 ENCOUNTER — Encounter: Payer: Self-pay | Admitting: Internal Medicine

## 2021-10-22 VITALS — BP 140/82 | HR 96 | Ht 69.0 in | Wt 261.6 lb

## 2021-10-22 DIAGNOSIS — N760 Acute vaginitis: Secondary | ICD-10-CM | POA: Diagnosis not present

## 2021-10-22 DIAGNOSIS — E119 Type 2 diabetes mellitus without complications: Secondary | ICD-10-CM | POA: Diagnosis not present

## 2021-10-22 DIAGNOSIS — K219 Gastro-esophageal reflux disease without esophagitis: Secondary | ICD-10-CM | POA: Diagnosis not present

## 2021-10-22 DIAGNOSIS — I10 Essential (primary) hypertension: Secondary | ICD-10-CM

## 2021-10-22 DIAGNOSIS — E785 Hyperlipidemia, unspecified: Secondary | ICD-10-CM

## 2021-10-22 DIAGNOSIS — E66813 Obesity, class 3: Secondary | ICD-10-CM

## 2021-10-22 LAB — GLUCOSE, POCT (MANUAL RESULT ENTRY): POC Glucose: 135 mg/dl — AB (ref 70–99)

## 2021-10-22 MED ORDER — CLOTRIMAZOLE-BETAMETHASONE 1-0.05 % EX CREA: TOPICAL_CREAM | Freq: Two times a day (BID) | CUTANEOUS | 1 refills | Status: DC

## 2021-10-22 MED ORDER — GLIMEPIRIDE 4 MG PO TABS: ORAL_TABLET | ORAL | 6 refills | Status: DC

## 2021-10-22 NOTE — Assessment & Plan Note (Signed)
Refer to GYN, will use Lotrisone cream for vaginal itching

## 2021-10-22 NOTE — Assessment & Plan Note (Signed)
-   The patient's GERD is stable on medication.  - Instructed the patient to avoid eating spicy and acidic foods, as well as foods high in fat. - Instructed the patient to avoid eating large meals or meals 2-3 hours prior to sleeping. 

## 2021-10-22 NOTE — Assessment & Plan Note (Signed)

## 2021-10-22 NOTE — Assessment & Plan Note (Signed)

## 2021-10-22 NOTE — Progress Notes (Signed)
Established Patient Office Visit  Subjective:  Patient ID: Victoria Dalton, female    DOB: Jul 26, 1930  Age: 88 y.o. MRN: [id number]  CC:  Chief Complaint  Patient presents with   Diabetes    Diabetes   Victoria Dalton presents for general check , c/o lt sided  headache Complaint itching of the vagina, she uses a walker to walk, I instructed her on walking exercises she also complains of pain in the right inguinal region, she is on hormonal therapy, she does not smoke does not drink,  Past Medical History:  Diagnosis Date   Diabetes mellitus without complication ([place])    Dyspnea    Gallbladder calculus    GERD (gastroesophageal reflux disease)    Hyperlipidemia    Hypertension    Hypertensive retinopathy    OU   Kidney stone    Macular degeneration    Dry OU   Mass    Parotid swelling    UTI (lower urinary tract infection)     Past Surgical History:  Procedure Laterality Date   CATARACT EXTRACTION Bilateral 02/2020   Dr. Vevelyn Royals   CHOLECYSTECTOMY N/A 04/30/2016   Procedure: CHOLECYSTECTOMY;  Surgeon: Jules Husbands, MD;  Location: [facility] ORS;  Service: General;  Laterality: N/A;   DIAGNOSTIC LAPAROSCOPIC LIVER BIOPSY  02/28/2016   Procedure: DIAGNOSTIC LAPAROSCOPIC LIVER BIOPSY;  Surgeon: Jules Husbands, MD;  Location: [facility] ORS;  Service: General;;   ERCP N/A 01/13/2016   Procedure: ENDOSCOPIC RETROGRADE CHOLANGIOPANCREATOGRAPHY (ERCP);  Surgeon: Lucilla Lame, MD;  Location: [facility] ENDOSCOPY;  Service: Endoscopy;  Laterality: N/A;   ERCP N/A 04/08/2016   Procedure: ENDOSCOPIC RETROGRADE CHOLANGIOPANCREATOGRAPHY (ERCP) Stent removal;  Surgeon: Lucilla Lame, MD;  Location: [facility] ENDOSCOPY;  Service: Endoscopy;  Laterality: N/A;   EYE SURGERY Bilateral 02/2020   Cat Sx - Dr. Vevelyn Royals   INTRAOPERATIVE CHOLANGIOGRAM  04/30/2016   Procedure: INTRAOPERATIVE CHOLANGIOGRAM;  Surgeon: Jules Husbands, MD;  Location: [facility] ORS;  Service: General;;   KNEE ARTHROSCOPY Left     LAPAROSCOPY  02/28/2016   Procedure: Diagnotic laparoscopy with omental biopsy;  Surgeon: Jules Husbands, MD;  Location: [facility] ORS;  Service: General;;   TONSILLECTOMY      Family History  Problem Relation Age of Onset   Cancer Father    Heart attack Father    Cancer Brother    Diabetes Brother    Diabetes Son    Breast cancer Neg Hx     Social History   Socioeconomic History   Marital status: Widowed    Spouse name: Not on file   Number of children: Not on file   Years of education: Not on file   Highest education level: Not on file  Occupational History   Not on file  Tobacco Use   Smoking status: Never   Smokeless tobacco: Never  Vaping Use   Vaping Use: Never used  Substance and Sexual Activity   Alcohol use: No   Drug use: No   Sexual activity: Not Currently  Other Topics Concern   Not on file  Social History Narrative   Not on file   Social Determinants of Health   Financial Resource Strain: Low Risk    Difficulty of Paying Living Expenses: Not very hard  Food Insecurity: No Food Insecurity   Worried About Running Out of Food in the Last Year: Never true   Ran Out of Food in the Last Year: Never true  Transportation Needs: No Transportation Needs  Lack of Transportation (Medical): No   Lack of Transportation (Non-Medical): No  Physical Activity: Inactive   Days of Exercise per Week: 0 days   Minutes of Exercise per Session: 0 min  Stress: No Stress Concern Present   Feeling of Stress : Not at all  Social Connections: Socially Isolated   Frequency of Communication with Friends and Family: More than three times a week   Frequency of Social Gatherings with Friends and Family: More than three times a week   Attends Religious Services: Never   Marine scientist or Organizations: No   Attends Archivist Meetings: Never   Marital Status: Widowed  Human resources officer Violence: Not At Risk   Fear of Current or Ex-Partner: No   Emotionally  Abused: No   Physically Abused: No   Sexually Abused: No     Current Outpatient Medications:    acetaminophen (TYLENOL) 500 MG tablet, Take 500 mg by mouth every 6 (six) hours as needed for mild pain., Disp: , Rfl:    aspirin EC 81 MG tablet, Take 81 mg by mouth daily., Disp: , Rfl:    calcium carbonate (OS-CAL) 1250 (500 Ca) MG chewable tablet, Chew by mouth., Disp: , Rfl:    calcium carbonate (TUMS - DOSED IN MG ELEMENTAL CALCIUM) 500 MG chewable tablet, Chew 2 tablets by mouth daily as needed for indigestion or heartburn., Disp: , Rfl:    carboxymethylcellulose (REFRESH PLUS) 0.5 % SOLN, 1 drop 3 (three) times daily as needed., Disp: , Rfl:    clotrimazole-betamethasone (LOTRISONE) cream, APP EXT AA BID, Disp: , Rfl:    conjugated estrogens (PREMARIN) vaginal cream, Place 1 Applicatorful vaginally 2 (two) times a week. 1 gram vaginally at bedtime twice weekly, Disp: 30 g, Rfl: 3   ketoconazole (NIZORAL) 2 % cream, Apply to skin folds once to twice daily for rash., Disp: 60 g, Rfl: 3   losartan (COZAAR) 100 MG tablet, TAKE 1 TABLET BY MOUTH DAILY, Disp: 30 tablet, Rfl: 6   metoprolol succinate (TOPROL-XL) 100 MG 24 hr tablet, TAKE 1 TABLET BY MOUTH DAILY, Disp: 30 tablet, Rfl: 3   metoprolol succinate (TOPROL-XL) 100 MG 24 hr tablet, TAKE 1 TABLET BY MOUTH DAILY, Disp: 30 tablet, Rfl: 3   metoprolol succinate (TOPROL-XL) 100 MG 24 hr tablet, TAKE 1 TABLET BY MOUTH DAILY, Disp: 30 tablet, Rfl: 3   Multiple Vitamins-Minerals (PRESERVISION AREDS 2 PO), Take by mouth., Disp: , Rfl:    nystatin-triamcinolone ointment (MYCOLOG), Apply 1 application topically 2 (two) times daily., Disp: 30 g, Rfl: 0   omeprazole (PRILOSEC) 20 MG capsule, Take by mouth., Disp: , Rfl:    ondansetron (ZOFRAN ODT) 4 MG disintegrating tablet, Take 1 tablet (4 mg total) by mouth every 8 (eight) hours as needed for nausea or vomiting., Disp: 20 tablet, Rfl: 0   ONE TOUCH ULTRA TEST test strip, , Disp: , Rfl: 0   ONETOUCH  ULTRA test strip, USE AS DIRECTED TO TEST BLOOD SUGAR, Disp: 100 strip, Rfl: 6   pantoprazole (PROTONIX) 40 MG tablet, TAKE 1 TABLET(40 MG) BY MOUTH DAILY, Disp: 30 tablet, Rfl: 6   phenazopyridine (PYRIDIUM) 95 MG tablet, Take 1 tablet (95 mg total) by mouth 3 (three) times daily as needed for pain., Disp: 90 tablet, Rfl: 0   Phenazopyridine HCl (AZO TABS PO), Take by mouth., Disp: , Rfl:    simvastatin (ZOCOR) 40 MG tablet, Take 1 tablet (40 mg total) by mouth daily., Disp: 90 tablet, Rfl: 3  glimepiride (AMARYL) 4 MG tablet, TAKE 1 TABLET(4 MG) BY MOUTH DAILY BEFORE BREAKFAST, Disp: 30 tablet, Rfl: 6   Allergies  Allergen Reactions   Chlorhexidine Itching   Poison Oak Extract [Poison Oak Extract]    Ramipril Other (See Comments)    IRREGULAR HEART BEAT   Penicillins Rash    Has patient had a PCN reaction causing immediate rash, facial/tongue/throat swelling, SOB or lightheadedness with hypotension: Yes Has patient had a PCN reaction causing severe rash involving mucus membranes or skin necrosis: Yes Has patient had a PCN reaction that required hospitalization No Has patient had a PCN reaction occurring within the last 10 years: No If all of the above answers are "NO", then may proceed with Cephalosporin use.    Sulfa Antibiotics Rash    ROS Review of Systems  Constitutional: Negative.   HENT: Negative.    Eyes: Negative.   Respiratory: Negative.    Cardiovascular: Negative.   Gastrointestinal: Negative.   Endocrine: Negative.   Genitourinary: Negative.   Musculoskeletal: Negative.   Skin: Negative.   Allergic/Immunologic: Negative.   Neurological: Negative.   Hematological: Negative.   Psychiatric/Behavioral: Negative.    All other systems reviewed and are negative.    Objective:    Physical Exam Vitals reviewed.  Constitutional:      Appearance: Normal appearance.  HENT:     Mouth/Throat:     Mouth: Mucous membranes are moist.  Eyes:     Pupils: Pupils are  equal, round, and reactive to light.  Neck:     Vascular: No carotid bruit.  Cardiovascular:     Rate and Rhythm: Normal rate and regular rhythm.     Pulses: Normal pulses.     Heart sounds: Normal heart sounds.  Pulmonary:     Effort: Pulmonary effort is normal.     Breath sounds: Normal breath sounds.  Abdominal:     General: Bowel sounds are normal.     Palpations: Abdomen is soft. There is no hepatomegaly, splenomegaly or mass.     Tenderness: There is no abdominal tenderness.     Hernia: No hernia is present.  Musculoskeletal:        General: No tenderness.     Cervical back: Neck supple.     Right lower leg: No edema.     Left lower leg: No edema.  Skin:    Findings: No rash.  Neurological:     Mental Status: She is alert and oriented to person, place, and time.     Motor: No weakness.  Psychiatric:        Mood and Affect: Mood and affect normal.        Behavior: Behavior normal.    BP 140/82   Pulse 96   Ht '5\' 9"'$  (1.753 m)   Wt 261 lb 9.6 oz (118.7 kg)   BMI 38.63 kg/m  Wt Readings from Last 3 Encounters:  10/22/21 261 lb 9.6 oz (118.7 kg)  08/21/21 263 lb 9.6 oz (119.6 kg)  05/01/21 259 lb (117.5 kg)     Health Maintenance Due  Topic Date Due   FOOT EXAM  Never done   DEXA SCAN  Never done   Zoster Vaccines- Shingrix (2 of 2) 12/25/2008   Pneumonia Vaccine 46+ Years old (2 - PCV) 08/13/2018   COVID-19 Vaccine (3 - Moderna risk series) 09/05/2019    There are no preventive care reminders to display for this patient.  Lab Results  Component Value Date   TSH  2.19 04/05/2020   Lab Results  Component Value Date   WBC 17.1 (H) 10/06/2020   HGB 14.2 10/06/2020   HCT 42.6 10/06/2020   MCV 92.2 10/06/2020   PLT 203 10/06/2020   Lab Results  Component Value Date   NA 138 10/12/2020   K 4.6 10/12/2020   CO2 28 10/12/2020   GLUCOSE 236 (H) 10/12/2020   BUN 20 10/12/2020   CREATININE 1.05 (H) 10/12/2020   BILITOT 0.8 10/06/2020   ALKPHOS 108  10/06/2020   AST 26 10/06/2020   ALT 17 10/06/2020   PROT 7.0 10/06/2020   ALBUMIN 4.0 10/06/2020   CALCIUM 9.2 10/12/2020   ANIONGAP 12 10/06/2020   Lab Results  Component Value Date   CHOL 169 04/05/2020   Lab Results  Component Value Date   HDL 53 04/05/2020   Lab Results  Component Value Date   LDLCALC 97 04/05/2020   Lab Results  Component Value Date   TRIG 99 04/05/2020   Lab Results  Component Value Date   CHOLHDL 3.2 04/05/2020   Lab Results  Component Value Date   HGBA1C 7.2 (A) 05/01/2021      Assessment & Plan:   Problem List Items Addressed This Visit       Cardiovascular and Mediastinum   Benign essential HTN     Digestive   Gastro-esophageal reflux disease without esophagitis    - The patient's GERD is stable on medication.  - Instructed the patient to avoid eating spicy and acidic foods, as well as foods high in fat. - Instructed the patient to avoid eating large meals or meals 2-3 hours prior to sleeping.         Endocrine   Controlled type 2 diabetes mellitus without complication ([place]) - Primary    - The patient's blood sugar is labile on med. - The patient will continue the current treatment regimen.  - I encouraged the patient to regularly check blood sugar.  - I encouraged the patient to monitor diet. I encouraged the patient to eat low-carb and low-sugar to help prevent blood sugar spikes.  - I encouraged the patient to continue following their prescribed treatment plan for diabetes - I informed the patient to get help if blood sugar drops below '54mg'$ /dL, or if suddenly have trouble thinking clearly or breathing.  Patient was advised to buy a book on diabetes from a local bookstore or from [place]  Patient should read 2 chapters every day to keep the motivation going, this is in addition to some of the materials we provided them from the office.  There are other resources on the Internet like YouTube and wilkipedia to get an education on  the diabetes       Relevant Medications   glimepiride (AMARYL) 4 MG tablet   Other Relevant Orders   POCT glucose (manual entry) (Completed)     Genitourinary   Vaginitis    Refer to GYN, will use Lotrisone cream for vaginal itching         Other   Dyslipidemia   Class 3 severe obesity due to excess calories in adult [facility])    - I encouraged the patient to lose weight.  - I educated them on making healthy dietary choices including eating more fruits and vegetables and less fried foods. - I encouraged the patient to exercise more, and educated on the benefits of exercise including weight loss, diabetes prevention, and hypertension prevention.   Dietary counseling with a registered dietician  Referral to a weight management support group (e.g. Weight Watchers, Overeaters Anonymous)  If your BMI is greater than 29 or you have gained more than 15 pounds you should work on weight loss.  Attend a healthy cooking class        Relevant Medications   glimepiride (AMARYL) 4 MG tablet    Meds ordered this encounter  Medications   glimepiride (AMARYL) 4 MG tablet    Sig: TAKE 1 TABLET(4 MG) BY MOUTH DAILY BEFORE BREAKFAST    Dispense:  30 tablet    Refill:  6  Patient complains pain in the right groin.  Also complains of vaginal itching.,  She came on a walker I instructed her walking exercises #2 give her Lotrisone cream for vaginal itching #3 Patient was advised to lose weight.  Referred to the eye doctor.  Follow-up: No follow-ups on file.    Cletis Athens, MD

## 2021-12-30 ENCOUNTER — Other Ambulatory Visit: Payer: Self-pay | Admitting: Internal Medicine

## 2022-01-20 ENCOUNTER — Encounter: Payer: Self-pay | Admitting: Internal Medicine

## 2022-01-20 ENCOUNTER — Ambulatory Visit (INDEPENDENT_AMBULATORY_CARE_PROVIDER_SITE_OTHER): Payer: Medicare Other | Admitting: Internal Medicine

## 2022-01-20 VITALS — BP 136/73 | HR 60 | Ht 69.0 in | Wt 264.4 lb

## 2022-01-20 DIAGNOSIS — I10 Essential (primary) hypertension: Secondary | ICD-10-CM | POA: Diagnosis not present

## 2022-01-20 DIAGNOSIS — E119 Type 2 diabetes mellitus without complications: Secondary | ICD-10-CM

## 2022-01-20 DIAGNOSIS — K219 Gastro-esophageal reflux disease without esophagitis: Secondary | ICD-10-CM | POA: Diagnosis not present

## 2022-01-20 LAB — GLUCOSE, POCT (MANUAL RESULT ENTRY): POC Glucose: 150 mg/dl — AB (ref 70–99)

## 2022-01-20 NOTE — Assessment & Plan Note (Signed)

## 2022-01-20 NOTE — Assessment & Plan Note (Signed)

## 2022-01-20 NOTE — Progress Notes (Signed)
Established Patient Office Visit  Subjective:  Patient ID: Victoria Dalton, female    DOB: February 09, 1931  Age: 88 y.o. MRN: [id number]  CC:  Chief Complaint  Patient presents with   Diabetes    Diabetes    Victoria Dalton presents for check up  Past Medical History:  Diagnosis Date   Diabetes mellitus without complication ([place])    Dyspnea    Gallbladder calculus    GERD (gastroesophageal reflux disease)    Hyperlipidemia    Hypertension    Hypertensive retinopathy    OU   Kidney stone    Macular degeneration    Dry OU   Mass    Parotid swelling    UTI (lower urinary tract infection)     Past Surgical History:  Procedure Laterality Date   CATARACT EXTRACTION Bilateral 02/2020   Dr. Vevelyn Royals   CHOLECYSTECTOMY N/A 04/30/2016   Procedure: CHOLECYSTECTOMY;  Surgeon: Jules Husbands, MD;  Location: [facility] ORS;  Service: General;  Laterality: N/A;   DIAGNOSTIC LAPAROSCOPIC LIVER BIOPSY  02/28/2016   Procedure: DIAGNOSTIC LAPAROSCOPIC LIVER BIOPSY;  Surgeon: Jules Husbands, MD;  Location: [facility] ORS;  Service: General;;   ERCP N/A 01/13/2016   Procedure: ENDOSCOPIC RETROGRADE CHOLANGIOPANCREATOGRAPHY (ERCP);  Surgeon: Lucilla Lame, MD;  Location: [facility] ENDOSCOPY;  Service: Endoscopy;  Laterality: N/A;   ERCP N/A 04/08/2016   Procedure: ENDOSCOPIC RETROGRADE CHOLANGIOPANCREATOGRAPHY (ERCP) Stent removal;  Surgeon: Lucilla Lame, MD;  Location: [facility] ENDOSCOPY;  Service: Endoscopy;  Laterality: N/A;   EYE SURGERY Bilateral 02/2020   Cat Sx - Dr. Vevelyn Royals   INTRAOPERATIVE CHOLANGIOGRAM  04/30/2016   Procedure: INTRAOPERATIVE CHOLANGIOGRAM;  Surgeon: Jules Husbands, MD;  Location: [facility] ORS;  Service: General;;   KNEE ARTHROSCOPY Left    LAPAROSCOPY  02/28/2016   Procedure: Diagnotic laparoscopy with omental biopsy;  Surgeon: Jules Husbands, MD;  Location: [facility] ORS;  Service: General;;   TONSILLECTOMY      Family History  Problem Relation Age of Onset   Cancer Father     Heart attack Father    Cancer Brother    Diabetes Brother    Diabetes Son    Breast cancer Neg Hx     Social History   Socioeconomic History   Marital status: Widowed    Spouse name: Not on file   Number of children: Not on file   Years of education: Not on file   Highest education level: Not on file  Occupational History   Not on file  Tobacco Use   Smoking status: Never   Smokeless tobacco: Never  Vaping Use   Vaping Use: Never used  Substance and Sexual Activity   Alcohol use: No   Drug use: No   Sexual activity: Not Currently  Other Topics Concern   Not on file  Social History Narrative   Not on file   Social Determinants of Health   Financial Resource Strain: Low Risk  (02/21/2021)   Overall Financial Resource Strain (CARDIA)    Difficulty of Paying Living Expenses: Not very hard  Food Insecurity: No Food Insecurity (02/21/2021)   Hunger Vital Sign    Worried About Running Out of Food in the Last Year: Never true    Ran Out of Food in the Last Year: Never true  Transportation Needs: No Transportation Needs (02/21/2021)   PRAPARE - Hydrologist (Medical): No    Lack of Transportation (Non-Medical): No  Physical Activity: Inactive (02/21/2021)  Exercise Vital Sign    Days of Exercise per Week: 0 days    Minutes of Exercise per Session: 0 min  Stress: No Stress Concern Present (02/21/2021)   [place]    Feeling of Stress : Not at all  Social Connections: Socially Isolated (02/21/2021)   Social Connection and Isolation Panel [NHANES]    Frequency of Communication with Friends and Family: More than three times a week    Frequency of Social Gatherings with Friends and Family: More than three times a week    Attends Religious Services: Never    Marine scientist or Organizations: No    Attends Archivist Meetings: Never    Marital Status: Widowed  Intimate  Partner Violence: Not At Risk (02/21/2021)   Humiliation, Afraid, Rape, and Kick questionnaire    Fear of Current or Ex-Partner: No    Emotionally Abused: No    Physically Abused: No    Sexually Abused: No     Current Outpatient Medications:    acetaminophen (TYLENOL) 500 MG tablet, Take 500 mg by mouth every 6 (six) hours as needed for mild pain., Disp: , Rfl:    aspirin EC 81 MG tablet, Take 81 mg by mouth daily., Disp: , Rfl:    calcium carbonate (OS-CAL) 1250 (500 Ca) MG chewable tablet, Chew by mouth., Disp: , Rfl:    carboxymethylcellulose (REFRESH PLUS) 0.5 % SOLN, 1 drop 3 (three) times daily as needed., Disp: , Rfl:    clotrimazole-betamethasone (LOTRISONE) cream, Apply topically in the morning and at bedtime., Disp: 30 g, Rfl: 1   conjugated estrogens (PREMARIN) vaginal cream, Place 1 Applicatorful vaginally 2 (two) times a week. 1 gram vaginally at bedtime twice weekly, Disp: 30 g, Rfl: 3   glimepiride (AMARYL) 4 MG tablet, TAKE 1 TABLET(4 MG) BY MOUTH DAILY BEFORE BREAKFAST, Disp: 30 tablet, Rfl: 6   ketoconazole (NIZORAL) 2 % cream, Apply to skin folds once to twice daily for rash., Disp: 60 g, Rfl: 3   losartan (COZAAR) 100 MG tablet, TAKE 1 TABLET BY MOUTH DAILY, Disp: 30 tablet, Rfl: 6   metoprolol succinate (TOPROL-XL) 100 MG 24 hr tablet, TAKE 1 TABLET BY MOUTH DAILY, Disp: 30 tablet, Rfl: 3   metoprolol succinate (TOPROL-XL) 100 MG 24 hr tablet, TAKE 1 TABLET BY MOUTH DAILY, Disp: 30 tablet, Rfl: 3   metoprolol succinate (TOPROL-XL) 100 MG 24 hr tablet, TAKE 1 TABLET BY MOUTH DAILY, Disp: 30 tablet, Rfl: 3   metoprolol succinate (TOPROL-XL) 100 MG 24 hr tablet, TAKE 1 TABLET BY MOUTH DAILY, Disp: 30 tablet, Rfl: 3   Multiple Vitamins-Minerals (PRESERVISION AREDS 2 PO), Take by mouth., Disp: , Rfl:    nystatin-triamcinolone ointment (MYCOLOG), Apply 1 application topically 2 (two) times daily., Disp: 30 g, Rfl: 0   omeprazole (PRILOSEC) 20 MG capsule, Take by mouth., Disp: ,  Rfl:    ondansetron (ZOFRAN ODT) 4 MG disintegrating tablet, Take 1 tablet (4 mg total) by mouth every 8 (eight) hours as needed for nausea or vomiting., Disp: 20 tablet, Rfl: 0   ONE TOUCH ULTRA TEST test strip, , Disp: , Rfl: 0   pantoprazole (PROTONIX) 40 MG tablet, TAKE 1 TABLET(40 MG) BY MOUTH DAILY, Disp: 30 tablet, Rfl: 6   phenazopyridine (PYRIDIUM) 95 MG tablet, Take 1 tablet (95 mg total) by mouth 3 (three) times daily as needed for pain., Disp: 90 tablet, Rfl: 0   Phenazopyridine HCl (AZO TABS PO),  Take by mouth., Disp: , Rfl:    simvastatin (ZOCOR) 40 MG tablet, Take 1 tablet (40 mg total) by mouth daily., Disp: 90 tablet, Rfl: 3   Allergies  Allergen Reactions   Chlorhexidine Itching   Poison Oak Extract [Poison Oak Extract]    Ramipril Other (See Comments)    IRREGULAR HEART BEAT   Penicillins Rash    Has patient had a PCN reaction causing immediate rash, facial/tongue/throat swelling, SOB or lightheadedness with hypotension: Yes Has patient had a PCN reaction causing severe rash involving mucus membranes or skin necrosis: Yes Has patient had a PCN reaction that required hospitalization No Has patient had a PCN reaction occurring within the last 10 years: No If all of the above answers are "NO", then may proceed with Cephalosporin use.    Sulfa Antibiotics Rash    ROS Review of Systems  Constitutional: Negative.   HENT: Negative.    Eyes: Negative.   Respiratory: Negative.    Cardiovascular: Negative.   Gastrointestinal: Negative.   Endocrine: Negative.   Genitourinary: Negative.   Musculoskeletal: Negative.   Skin: Negative.   Allergic/Immunologic: Negative.   Neurological: Negative.   Hematological: Negative.   Psychiatric/Behavioral: Negative.    All other systems reviewed and are negative.     Objective:    Physical Exam Vitals reviewed.  Constitutional:      Appearance: Normal appearance.  HENT:     Mouth/Throat:     Mouth: Mucous membranes are  moist.  Eyes:     Pupils: Pupils are equal, round, and reactive to light.  Neck:     Vascular: No carotid bruit.  Cardiovascular:     Rate and Rhythm: Normal rate and regular rhythm.     Pulses: Normal pulses.     Heart sounds: Normal heart sounds.  Pulmonary:     Effort: Pulmonary effort is normal.     Breath sounds: Normal breath sounds.  Abdominal:     General: Bowel sounds are normal.     Palpations: Abdomen is soft. There is no hepatomegaly, splenomegaly or mass.     Tenderness: There is no abdominal tenderness.     Hernia: No hernia is present.  Musculoskeletal:        General: No tenderness.     Cervical back: Neck supple.     Right lower leg: No edema.     Left lower leg: No edema.  Skin:    Findings: No rash.  Neurological:     Mental Status: She is alert and oriented to person, place, and time.     Motor: No weakness.  Psychiatric:        Mood and Affect: Mood and affect normal.        Behavior: Behavior normal.     BP 136/73   Pulse 60   Ht '5\' 9"'$  (1.753 m)   Wt 264 lb 6.4 oz (119.9 kg)   BMI 39.05 kg/m  Wt Readings from Last 3 Encounters:  01/20/22 264 lb 6.4 oz (119.9 kg)  10/22/21 261 lb 9.6 oz (118.7 kg)  08/21/21 263 lb 9.6 oz (119.6 kg)     Health Maintenance Due  Topic Date Due   FOOT EXAM  Never done   DEXA SCAN  Never done   Zoster Vaccines- Shingrix (2 of 2) 12/25/2008   Pneumonia Vaccine 5+ Years old (2 - PCV) 08/13/2018   COVID-19 Vaccine (3 - Moderna risk series) 09/05/2019   HEMOGLOBIN A1C  10/30/2021   INFLUENZA VACCINE  12/24/2021   OPHTHALMOLOGY EXAM  01/16/2022    There are no preventive care reminders to display for this patient.  Lab Results  Component Value Date   TSH 2.19 04/05/2020   Lab Results  Component Value Date   WBC 17.1 (H) 10/06/2020   HGB 14.2 10/06/2020   HCT 42.6 10/06/2020   MCV 92.2 10/06/2020   PLT 203 10/06/2020   Lab Results  Component Value Date   NA 138 10/12/2020   K 4.6 10/12/2020   CO2  28 10/12/2020   GLUCOSE 236 (H) 10/12/2020   BUN 20 10/12/2020   CREATININE 1.05 (H) 10/12/2020   BILITOT 0.8 10/06/2020   ALKPHOS 108 10/06/2020   AST 26 10/06/2020   ALT 17 10/06/2020   PROT 7.0 10/06/2020   ALBUMIN 4.0 10/06/2020   CALCIUM 9.2 10/12/2020   ANIONGAP 12 10/06/2020   Lab Results  Component Value Date   CHOL 169 04/05/2020   Lab Results  Component Value Date   HDL 53 04/05/2020   Lab Results  Component Value Date   LDLCALC 97 04/05/2020   Lab Results  Component Value Date   TRIG 99 04/05/2020   Lab Results  Component Value Date   CHOLHDL 3.2 04/05/2020   Lab Results  Component Value Date   HGBA1C 7.2 (A) 05/01/2021      Assessment & Plan:   Problem List Items Addressed This Visit       Cardiovascular and Mediastinum   Benign essential HTN     Digestive   Gastro-esophageal reflux disease without esophagitis    Counseling  If a person has gastroesophageal reflux disease (GERD), food and stomach acid move back up into the esophagus and cause symptoms or problems such as damage to the esophagus.  Anti-reflux measures include: raising the head of the bed, avoiding tight clothing or belts, avoiding eating late at night, not lying down shortly after mealtime, and achieving weight loss.  Avoid ASA, NSAID's, caffeine, alcohol, and tobacco.   OTC Pepcid and/or Tums are often very helpful for as needed use.   However, for persisting chronic or daily symptoms, stronger medications like Omeprazole may be needed.  You may need to avoid foods and drinks such as: ? Coffee and tea (with or without caffeine). ? Drinks that contain alcohol. ? Energy drinks and sports drinks. ? Bubbly (carbonated) drinks or sodas. ? Chocolate and cocoa. ? Peppermint and mint flavorings. ? Garlic and onions. ? Horseradish. ? Spicy and acidic foods. These include peppers, chili powder, curry powder, vinegar, hot sauces, and BBQ sauce. ? Citrus fruit juices and citrus  fruits, such as oranges, lemons, and limes. ? Tomato-based foods. These include red sauce, chili, salsa, and pizza with red sauce. ? Fried and fatty foods. These include donuts, french fries, potato chips, and high-fat dressings. ? High-fat meats. These include hot dogs, rib eye steak, sausage, ham, and bacon.         Endocrine   Controlled type 2 diabetes mellitus without complication ([place])    - The patient's blood sugar is labile on med. - The patient will continue the current treatment regimen.  - I encouraged the patient to regularly check blood sugar.  - I encouraged the patient to monitor diet. I encouraged the patient to eat low-carb and low-sugar to help prevent blood sugar spikes.  - I encouraged the patient to continue following their prescribed treatment plan for diabetes - I informed the patient to get help if blood sugar drops below  $'54mg'w$ /dL, or if suddenly have trouble thinking clearly or breathing.  Patient was advised to buy a book on diabetes from a local bookstore or from [place]  Patient should read 2 chapters every day to keep the motivation going, this is in addition to some of the materials we provided them from the office.  There are other resources on the Internet like YouTube and wilkipedia to get an education on the diabetes      Type 2 diabetes mellitus without complication ([place]) - Primary   Relevant Orders   POCT glucose (manual entry) (Completed)     Other   Class 3 severe obesity due to excess calories in adult [facility])    - I encouraged the patient to lose weight.  - I educated them on making healthy dietary choices including eating more fruits and vegetables and less fried foods. - I encouraged the patient to exercise more, and educated on the benefits of exercise including weight loss, diabetes prevention, and hypertension prevention.   Dietary counseling with a registered dietician  Referral to a weight management support group (e.g. Weight Watchers,  Overeaters Anonymous)  If your BMI is greater than 29 or you have gained more than 15 pounds you should work on weight loss.  Attend a healthy cooking class        No orders of the defined types were placed in this encounter.   Follow-up: No follow-ups on file.    Cletis Athens, MD

## 2022-01-20 NOTE — Assessment & Plan Note (Signed)

## 2022-01-31 ENCOUNTER — Encounter (INDEPENDENT_AMBULATORY_CARE_PROVIDER_SITE_OTHER): Payer: Medicare Other | Admitting: Ophthalmology

## 2022-01-31 LAB — HM DIABETES EYE EXAM

## 2022-02-05 ENCOUNTER — Encounter (INDEPENDENT_AMBULATORY_CARE_PROVIDER_SITE_OTHER): Payer: Medicare Other | Admitting: Ophthalmology

## 2022-02-05 DIAGNOSIS — E119 Type 2 diabetes mellitus without complications: Secondary | ICD-10-CM

## 2022-02-05 DIAGNOSIS — I10 Essential (primary) hypertension: Secondary | ICD-10-CM

## 2022-02-05 DIAGNOSIS — H353132 Nonexudative age-related macular degeneration, bilateral, intermediate dry stage: Secondary | ICD-10-CM

## 2022-02-05 DIAGNOSIS — H25813 Combined forms of age-related cataract, bilateral: Secondary | ICD-10-CM

## 2022-02-05 DIAGNOSIS — Z961 Presence of intraocular lens: Secondary | ICD-10-CM

## 2022-02-05 DIAGNOSIS — H18423 Band keratopathy, bilateral: Secondary | ICD-10-CM

## 2022-02-05 DIAGNOSIS — H35033 Hypertensive retinopathy, bilateral: Secondary | ICD-10-CM

## 2022-02-05 NOTE — Progress Notes (Addendum)
[facility] Note  02/10/2022     CHIEF COMPLAINT Patient presents for Retina Follow Up    HISTORY OF PRESENT ILLNESS: Victoria Dalton is a 88 y.o. female who presents to the clinic today for:  HPI     Retina Follow Up   Patient presents with  Dry AMD.  In both eyes.  Severity is moderate.  Duration of 4 months.  Since onset it is stable.  I, the attending physician,  performed the HPI with the patient and updated documentation appropriately.        Comments   Patient states vision the same OU. BS was under 150 this am. Last a1c was 6.6.      Last edited by Bernarda Caffey, MD on 02/10/2022  1:41 PM.    Pt states she is not having any problems with her vision, she has been reading a lot  Referring physician: Vevelyn Royals, MD [address] [place], [place] [address]  HISTORICAL INFORMATION:   Selected notes from the [facility] Referred by Dr. Lucita Ferrara LEE: 02/22/2020 BCVA: OD 20/70, OS 20/70 Ocular Hx- Mac cyst OU, Cataracts OU, Band Keratopathy OU, ARMD OU PMH- DM   CURRENT MEDICATIONS: Current Outpatient Medications (Ophthalmic Drugs)  Medication Sig   carboxymethylcellulose (REFRESH PLUS) 0.5 % SOLN 1 drop 3 (three) times daily as needed.   No current facility-administered medications for this visit. (Ophthalmic Drugs)   Current Outpatient Medications (Other)  Medication Sig   acetaminophen (TYLENOL) 500 MG tablet Take 500 mg by mouth every 6 (six) hours as needed for mild pain.   aspirin EC 81 MG tablet Take 81 mg by mouth daily.   calcium carbonate (OS-CAL) 1250 (500 Ca) MG chewable tablet Chew by mouth.   clotrimazole-betamethasone (LOTRISONE) cream Apply topically in the morning and at bedtime.   conjugated estrogens (PREMARIN) vaginal cream Place 1 Applicatorful vaginally 2 (two) times a week. 1 gram vaginally at bedtime twice weekly   glimepiride (AMARYL) 4 MG tablet TAKE 1 TABLET(4 MG) BY MOUTH DAILY  BEFORE BREAKFAST   ketoconazole (NIZORAL) 2 % cream Apply to skin folds once to twice daily for rash.   losartan (COZAAR) 100 MG tablet TAKE 1 TABLET BY MOUTH DAILY   metoprolol succinate (TOPROL-XL) 100 MG 24 hr tablet TAKE 1 TABLET BY MOUTH DAILY   metoprolol succinate (TOPROL-XL) 100 MG 24 hr tablet TAKE 1 TABLET BY MOUTH DAILY   metoprolol succinate (TOPROL-XL) 100 MG 24 hr tablet TAKE 1 TABLET BY MOUTH DAILY   metoprolol succinate (TOPROL-XL) 100 MG 24 hr tablet TAKE 1 TABLET BY MOUTH DAILY   Multiple Vitamins-Minerals (PRESERVISION AREDS 2 PO) Take by mouth.   nystatin-triamcinolone ointment (MYCOLOG) Apply 1 application topically 2 (two) times daily.   omeprazole (PRILOSEC) 20 MG capsule Take by mouth.   ondansetron (ZOFRAN ODT) 4 MG disintegrating tablet Take 1 tablet (4 mg total) by mouth every 8 (eight) hours as needed for nausea or vomiting.   ONE TOUCH ULTRA TEST test strip    pantoprazole (PROTONIX) 40 MG tablet TAKE 1 TABLET(40 MG) BY MOUTH DAILY   phenazopyridine (PYRIDIUM) 95 MG tablet Take 1 tablet (95 mg total) by mouth 3 (three) times daily as needed for pain.   Phenazopyridine HCl (AZO TABS PO) Take by mouth.   simvastatin (ZOCOR) 40 MG tablet Take 1 tablet (40 mg total) by mouth daily.   No current facility-administered medications for this visit. (Other)   REVIEW OF  SYSTEMS: ROS   Positive for: Gastrointestinal, Endocrine, Eyes Negative for: Constitutional, Neurological, Skin, Genitourinary, Musculoskeletal, HENT, Cardiovascular, Respiratory, Psychiatric, Allergic/Imm, Heme/Lymph Last edited by Roselee Nova D, COT on 02/10/2022  9:05 AM.     ALLERGIES Allergies  Allergen Reactions   Chlorhexidine Itching   Poison Oak Extract [Poison Oak Extract]    Ramipril Other (See Comments)    IRREGULAR HEART BEAT   Penicillins Rash    Has patient had a PCN reaction causing immediate rash, facial/tongue/throat swelling, SOB or lightheadedness with hypotension: Yes Has  patient had a PCN reaction causing severe rash involving mucus membranes or skin necrosis: Yes Has patient had a PCN reaction that required hospitalization No Has patient had a PCN reaction occurring within the last 10 years: No If all of the above answers are "NO", then may proceed with Cephalosporin use.    Sulfa Antibiotics Rash   PAST MEDICAL HISTORY Past Medical History:  Diagnosis Date   Diabetes mellitus without complication ([place])    Dyspnea    Gallbladder calculus    GERD (gastroesophageal reflux disease)    Hyperlipidemia    Hypertension    Hypertensive retinopathy    OU   Kidney stone    Macular degeneration    Dry OU   Mass    Parotid swelling    UTI (lower urinary tract infection)    Past Surgical History:  Procedure Laterality Date   CATARACT EXTRACTION Bilateral 02/2020   Dr. Vevelyn Royals   CHOLECYSTECTOMY N/A 04/30/2016   Procedure: CHOLECYSTECTOMY;  Surgeon: Jules Husbands, MD;  Location: [facility] ORS;  Service: General;  Laterality: N/A;   DIAGNOSTIC LAPAROSCOPIC LIVER BIOPSY  02/28/2016   Procedure: DIAGNOSTIC LAPAROSCOPIC LIVER BIOPSY;  Surgeon: Jules Husbands, MD;  Location: [facility] ORS;  Service: General;;   ERCP N/A 01/13/2016   Procedure: ENDOSCOPIC RETROGRADE CHOLANGIOPANCREATOGRAPHY (ERCP);  Surgeon: Lucilla Lame, MD;  Location: [facility] ENDOSCOPY;  Service: Endoscopy;  Laterality: N/A;   ERCP N/A 04/08/2016   Procedure: ENDOSCOPIC RETROGRADE CHOLANGIOPANCREATOGRAPHY (ERCP) Stent removal;  Surgeon: Lucilla Lame, MD;  Location: [facility] ENDOSCOPY;  Service: Endoscopy;  Laterality: N/A;   EYE SURGERY Bilateral 02/2020   Cat Sx - Dr. Vevelyn Royals   INTRAOPERATIVE CHOLANGIOGRAM  04/30/2016   Procedure: INTRAOPERATIVE CHOLANGIOGRAM;  Surgeon: Jules Husbands, MD;  Location: [facility] ORS;  Service: General;;   KNEE ARTHROSCOPY Left    LAPAROSCOPY  02/28/2016   Procedure: Diagnotic laparoscopy with omental biopsy;  Surgeon: Jules Husbands, MD;  Location: [facility] ORS;  Service:  General;;   TONSILLECTOMY     FAMILY HISTORY Family History  Problem Relation Age of Onset   Cancer Father    Heart attack Father    Cancer Brother    Diabetes Brother    Diabetes Son    Breast cancer Neg Hx    SOCIAL HISTORY Social History   Tobacco Use   Smoking status: Never   Smokeless tobacco: Never  Vaping Use   Vaping Use: Never used  Substance Use Topics   Alcohol use: No   Drug use: No       OPHTHALMIC EXAM:  Base Eye Exam     Visual Acuity (Snellen - Linear)       Right Left   Dist cc 20/60 20/60 +2   Dist ph cc 20/50 +1 20/50 +2    Correction: Glasses         Tonometry (Tonopen, 9:11 AM)       Right Left   Pressure  16 13         Pupils       Dark Light Shape React APD   Right 4 3 Round Brisk None   Left 4 3 Round Brisk None         Visual Fields (Counting fingers)       Left Right    Full Full         Extraocular Movement       Right Left    Full, Ortho Full, Ortho         Neuro/Psych     Oriented x3: Yes   Mood/Affect: Normal         Dilation     Both eyes: 1.0% Mydriacyl, 2.5% Phenylephrine @ 9:11 AM           Slit Lamp and Fundus Exam     Slit Lamp Exam       Right Left   Lids/Lashes Dermatochalasis - upper lid Dermatochalasis - upper lid, upper lid swelling   Conjunctiva/Sclera Mild inferior conjunctivochalasis Conjunctivochalasis inferior   Cornea early band K nasal and temporal extending centrally, 2-3+ PEE centrally, arcus, well healed temporal cataract wounds, tear film debris early band K nasal and temporal extending centrally, 2+PEE, arcus, well healed temporal cataract wounds, irregular epi   Anterior Chamber Deep and quiet Deep and quiet   Iris Round and dilated Round and dilated   Lens PC IOL in good position, 1-2+ Posterior capsular opacification PC IOL in good position, 1+ Posterior capsular opacification   Anterior Vitreous Vitreous syneresis, Posterior vitreous detachment Vitreous  syneresis, Posterior vitreous detachment         Fundus Exam       Right Left   Disc mild pallor, Sharp rim, +Peripapillary atrophy Pink and Sharp, temporal Peripapillary atrophy   C/D Ratio 0.5 0.4   Macula Flat, Blunted foveal reflex, RPE mottling, clumping and atrophy centrally, No heme or edema, mild vitelliform like lesion Flat, Blunted foveal reflex, RPE mottling, clumping and atrophy greatest centrally, No heme or edema   Vessels attenuated, mild tortuosity, mild copper wiring attenuated, Tortuous   Periphery Attached, reticular degeneration, No heme Attached, reticular degeneration, No heme           Refraction     Wearing Rx       Sphere Cylinder Axis Add   Right -1.00 +1.25 168 +3.00   Left -1.00 +1.00 180 +3.00           IMAGING AND PROCEDURES  Imaging and Procedures for 02/10/2022  OCT, Retina - OU - Both Eyes       Right Eye Quality was good. Central Foveal Thickness: 239. Progression has been stable. Findings include normal foveal contour, no IRF, no SRF, retinal drusen , subretinal hyper-reflective material, outer retinal atrophy (persistent focal central SRHM -- vitelliform like lesion; no central SRF, patchy ORA centrally).   Left Eye Quality was good. Central Foveal Thickness: 245. Progression has been stable. Findings include normal foveal contour, no IRF, no SRF, subretinal hyper-reflective material, outer retinal atrophy (focal central SRHM; no SRF, patchy ORA centrally).   Notes *Images captured and stored on drive  Diagnosis / Impression:  NFP, no IRF, no SRF OU OD: persistent focal central SRHM -- vitelliform like lesion; patchy ORA OS: focal central SRHM; patchy ORA  Clinical management:  See below  Abbreviations: NFP - Normal foveal profile. CME - cystoid macular edema. PED - pigment epithelial detachment. IRF - intraretinal fluid. SRF - subretinal  fluid. EZ - ellipsoid zone. ERM - epiretinal membrane. ORA - outer retinal atrophy. ORT -  outer retinal tubulation. SRHM - subretinal hyper-reflective material. IRHM - intraretinal hyper-reflective material            ASSESSMENT/PLAN:    ICD-10-CM   1. Intermediate stage nonexudative age-related macular degeneration of both eyes  H35.3132 OCT, Retina - OU - Both Eyes    2. Diabetes mellitus type 2 without retinopathy ([place])  E11.9     3. Essential hypertension  I10     4. Hypertensive retinopathy of both eyes  H35.033     5. Pseudophakia of both eyes  Z96.1     6. Band keratopathy of both eyes  H18.423        1. Age related macular degeneration, non-exudative, both eyes  - The incidence, anatomy, and pathology of dry AMD, risk of progression, and the AREDS and AREDS 2 studies including smoking risks discussed with patient.  - BCVA 20/40 OD, 20/50 OS  - OCT shows OD: persistent focal central SRHM; OS: Persistent focal central SRHM -- improved   - Continue [place] Grid monitoring  - f/u 4-6 months- DFE, OCT  2. Diabetes mellitus, type 2 without retinopathy - The incidence, risk factors for progression, natural history and treatment options for diabetic retinopathy  were discussed with patient.   - The need for close monitoring of blood glucose, blood pressure, and serum lipids, avoiding cigarette or any type of tobacco, and the need for long term follow up was also discussed with patient. - f/u in 1 year, sooner prn  3,4. Hypertensive retinopathy OU - discussed importance of tight BP control - monitor  5. Pseudophakia OU  - s/p CE/IOL w/Dr. Lucita Ferrara [phone]  - IOL in good position, doing well - monitor  6. Band keratopathy OU  - mild increase in central dryness OU  - increase ATs and lubricating gel  - will send back to Dr. Lucita Ferrara for further eval and management  Ophthalmic Meds Ordered this visit:  No orders of the defined types were placed in this encounter.    Return for f/u 4-6 months, non-exu ARMD OU.  There are no Patient Instructions on  file for this visit.  This document serves as a record of services personally performed by Gardiner Sleeper, MD, PhD. It was created on their behalf by Roselee Nova, COMT. The creation of this record is the provider's dictation and/or activities during the visit.  Electronically signed by: Roselee Nova, COMT 02/10/22 1:43 PM  This document serves as a record of services personally performed by Gardiner Sleeper, MD, PhD. It was created on their behalf by San Jetty. Owens Shark, OA an ophthalmic technician. The creation of this record is the provider's dictation and/or activities during the visit.    Electronically signed by: San Jetty. Owens Shark, [place] 09.18.2023 1:43 PM  Gardiner Sleeper, M.D., Ph.D. Diseases & Surgery of the Retina and Vitreous [facility]  I have reviewed the above documentation for accuracy and completeness, and I agree with the above. Gardiner Sleeper, M.D., Ph.D. 02/10/22 1:43 PM  Abbreviations: M myopia (nearsighted); A astigmatism; H hyperopia (farsighted); P presbyopia; Mrx spectacle prescription;  CTL contact lenses; OD right eye; OS left eye; OU both eyes  XT exotropia; ET esotropia; PEK punctate epithelial keratitis; PEE punctate epithelial erosions; DES dry eye syndrome; MGD meibomian gland dysfunction; ATs artificial tears; PFAT's preservative free artificial tears; [place] nuclear sclerotic cataract; PSC posterior subcapsular  cataract; ERM epi-retinal membrane; PVD posterior vitreous detachment; RD retinal detachment; DM diabetes mellitus; DR diabetic retinopathy; NPDR non-proliferative diabetic retinopathy; PDR proliferative diabetic retinopathy; CSME clinically significant macular edema; DME diabetic macular edema; dbh dot blot hemorrhages; CWS cotton wool spot; POAG primary open angle glaucoma; C/D cup-to-disc ratio; HVF humphrey visual field; GVF goldmann visual field; OCT optical coherence tomography; IOP intraocular pressure; BRVO Branch retinal vein occlusion;  CRVO central retinal vein occlusion; CRAO central retinal artery occlusion; BRAO branch retinal artery occlusion; RT retinal tear; SB scleral buckle; PPV pars plana vitrectomy; VH Vitreous hemorrhage; PRP panretinal laser photocoagulation; IVK intravitreal kenalog; VMT vitreomacular traction; MH Macular hole;  NVD neovascularization of the disc; NVE neovascularization elsewhere; AREDS age related eye disease study; ARMD age related macular degeneration; POAG primary open angle glaucoma; EBMD epithelial/anterior basement membrane dystrophy; ACIOL anterior chamber intraocular lens; IOL intraocular lens; PCIOL posterior chamber intraocular lens; Phaco/IOL phacoemulsification with intraocular lens placement; [place] photorefractive keratectomy; LASIK laser assisted in situ keratomileusis; HTN hypertension; DM diabetes mellitus; COPD chronic obstructive pulmonary disease

## 2022-02-10 ENCOUNTER — Encounter (INDEPENDENT_AMBULATORY_CARE_PROVIDER_SITE_OTHER): Payer: Self-pay | Admitting: Ophthalmology

## 2022-02-10 ENCOUNTER — Ambulatory Visit (INDEPENDENT_AMBULATORY_CARE_PROVIDER_SITE_OTHER): Payer: Medicare Other | Admitting: Ophthalmology

## 2022-02-10 DIAGNOSIS — H353132 Nonexudative age-related macular degeneration, bilateral, intermediate dry stage: Secondary | ICD-10-CM | POA: Diagnosis not present

## 2022-02-10 DIAGNOSIS — I10 Essential (primary) hypertension: Secondary | ICD-10-CM | POA: Diagnosis not present

## 2022-02-10 DIAGNOSIS — H35033 Hypertensive retinopathy, bilateral: Secondary | ICD-10-CM

## 2022-02-10 DIAGNOSIS — Z961 Presence of intraocular lens: Secondary | ICD-10-CM

## 2022-02-10 DIAGNOSIS — H18423 Band keratopathy, bilateral: Secondary | ICD-10-CM

## 2022-02-10 DIAGNOSIS — E119 Type 2 diabetes mellitus without complications: Secondary | ICD-10-CM

## 2022-03-03 ENCOUNTER — Encounter: Payer: Self-pay | Admitting: Internal Medicine

## 2022-03-03 ENCOUNTER — Ambulatory Visit (INDEPENDENT_AMBULATORY_CARE_PROVIDER_SITE_OTHER): Payer: Medicare Other | Admitting: Internal Medicine

## 2022-03-03 VITALS — BP 132/84 | HR 86 | Ht 69.0 in | Wt 271.2 lb

## 2022-03-03 DIAGNOSIS — I10 Essential (primary) hypertension: Secondary | ICD-10-CM | POA: Diagnosis not present

## 2022-03-03 DIAGNOSIS — E785 Hyperlipidemia, unspecified: Secondary | ICD-10-CM | POA: Diagnosis not present

## 2022-03-03 DIAGNOSIS — E119 Type 2 diabetes mellitus without complications: Secondary | ICD-10-CM | POA: Diagnosis not present

## 2022-03-03 LAB — GLUCOSE, POCT (MANUAL RESULT ENTRY): POC Glucose: 167 mg/dl — AB (ref 70–99)

## 2022-03-03 NOTE — Assessment & Plan Note (Signed)
Hypercholesterolemia  I advised the patient to follow Mediterranean diet This diet is rich in fruits vegetables and whole grain, and This diet is also rich in fish and lean meat Patient should also eat a handful of almonds or walnuts daily Recent heart study indicated that average follow-up on this kind of diet reduces the cardiovascular mortality by 50 to 70%== 

## 2022-03-03 NOTE — Addendum Note (Signed)
Addended by: Alois Cliche on: 03/03/2022 03:44 PM   Modules accepted: Orders

## 2022-03-03 NOTE — Assessment & Plan Note (Signed)

## 2022-03-03 NOTE — Assessment & Plan Note (Signed)
Behavioral modification strategies: increasing lean protein intake, decreasing simple carbohydrates, increasing vegetables, increasing water intake, decreasing eating out, no skipping meals, meal planning and cooking strategies, keeping healthy foods in the home and planning for success.

## 2022-03-03 NOTE — Progress Notes (Signed)
Established Patient Office Visit  Subjective:  Patient ID: Victoria Dalton, female    DOB: 1930/07/23  Age: 88 y.o. MRN: [id number]  CC:  Chief Complaint  Patient presents with   Annual Exam    Shortness of Breath This is a new problem. The current episode started 1 to 4 weeks ago. Pertinent negatives include no abdominal pain, chest pain, claudication, coryza, ear pain, fever, headaches, neck pain, PND or wheezing.    Victoria Dalton presents for sob  Past Medical History:  Diagnosis Date   Diabetes mellitus without complication ([place])    Dyspnea    Gallbladder calculus    GERD (gastroesophageal reflux disease)    Hyperlipidemia    Hypertension    Hypertensive retinopathy    OU   Kidney stone    Macular degeneration    Dry OU   Mass    Parotid swelling    UTI (lower urinary tract infection)     Past Surgical History:  Procedure Laterality Date   CATARACT EXTRACTION Bilateral 02/2020   Dr. Vevelyn Royals   CHOLECYSTECTOMY N/A 04/30/2016   Procedure: CHOLECYSTECTOMY;  Surgeon: Jules Husbands, MD;  Location: [facility] ORS;  Service: General;  Laterality: N/A;   DIAGNOSTIC LAPAROSCOPIC LIVER BIOPSY  02/28/2016   Procedure: DIAGNOSTIC LAPAROSCOPIC LIVER BIOPSY;  Surgeon: Jules Husbands, MD;  Location: [facility] ORS;  Service: General;;   ERCP N/A 01/13/2016   Procedure: ENDOSCOPIC RETROGRADE CHOLANGIOPANCREATOGRAPHY (ERCP);  Surgeon: Lucilla Lame, MD;  Location: [facility] ENDOSCOPY;  Service: Endoscopy;  Laterality: N/A;   ERCP N/A 04/08/2016   Procedure: ENDOSCOPIC RETROGRADE CHOLANGIOPANCREATOGRAPHY (ERCP) Stent removal;  Surgeon: Lucilla Lame, MD;  Location: [facility] ENDOSCOPY;  Service: Endoscopy;  Laterality: N/A;   EYE SURGERY Bilateral 02/2020   Cat Sx - Dr. Vevelyn Royals   INTRAOPERATIVE CHOLANGIOGRAM  04/30/2016   Procedure: INTRAOPERATIVE CHOLANGIOGRAM;  Surgeon: Jules Husbands, MD;  Location: [facility] ORS;  Service: General;;   KNEE ARTHROSCOPY Left    LAPAROSCOPY  02/28/2016    Procedure: Diagnotic laparoscopy with omental biopsy;  Surgeon: Jules Husbands, MD;  Location: [facility] ORS;  Service: General;;   TONSILLECTOMY      Family History  Problem Relation Age of Onset   Cancer Father    Heart attack Father    Cancer Brother    Diabetes Brother    Diabetes Son    Breast cancer Neg Hx     Social History   Socioeconomic History   Marital status: Widowed    Spouse name: Not on file   Number of children: Not on file   Years of education: Not on file   Highest education level: Not on file  Occupational History   Not on file  Tobacco Use   Smoking status: Never   Smokeless tobacco: Never  Vaping Use   Vaping Use: Never used  Substance and Sexual Activity   Alcohol use: No   Drug use: No   Sexual activity: Not Currently  Other Topics Concern   Not on file  Social History Narrative   Not on file   Social Determinants of Health   Financial Resource Strain: Low Risk  (02/21/2021)   Overall Financial Resource Strain (CARDIA)    Difficulty of Paying Living Expenses: Not very hard  Food Insecurity: No Food Insecurity (02/21/2021)   Hunger Vital Sign    Worried About Running Out of Food in the Last Year: Never true    [place] in the Last Year:  Never true  Transportation Needs: No Transportation Needs (02/21/2021)   PRAPARE - Hydrologist (Medical): No    Lack of Transportation (Non-Medical): No  Physical Activity: Inactive (02/21/2021)   Exercise Vital Sign    Days of Exercise per Week: 0 days    Minutes of Exercise per Session: 0 min  Stress: No Stress Concern Present (02/21/2021)   [place]    Feeling of Stress : Not at all  Social Connections: Socially Isolated (02/21/2021)   Social Connection and Isolation Panel [NHANES]    Frequency of Communication with Friends and Family: More than three times a week    Frequency of Social Gatherings with  Friends and Family: More than three times a week    Attends Religious Services: Never    Marine scientist or Organizations: No    Attends Archivist Meetings: Never    Marital Status: Widowed  Intimate Partner Violence: Not At Risk (02/21/2021)   Humiliation, Afraid, Rape, and Kick questionnaire    Fear of Current or Ex-Partner: No    Emotionally Abused: No    Physically Abused: No    Sexually Abused: No     Current Outpatient Medications:    acetaminophen (TYLENOL) 500 MG tablet, Take 500 mg by mouth every 6 (six) hours as needed for mild pain., Disp: , Rfl:    aspirin EC 81 MG tablet, Take 81 mg by mouth daily., Disp: , Rfl:    calcium carbonate (OS-CAL) 1250 (500 Ca) MG chewable tablet, Chew by mouth., Disp: , Rfl:    carboxymethylcellulose (REFRESH PLUS) 0.5 % SOLN, 1 drop 3 (three) times daily as needed., Disp: , Rfl:    clotrimazole-betamethasone (LOTRISONE) cream, Apply topically in the morning and at bedtime., Disp: 30 g, Rfl: 1   conjugated estrogens (PREMARIN) vaginal cream, Place 1 Applicatorful vaginally 2 (two) times a week. 1 gram vaginally at bedtime twice weekly, Disp: 30 g, Rfl: 3   glimepiride (AMARYL) 4 MG tablet, TAKE 1 TABLET(4 MG) BY MOUTH DAILY BEFORE BREAKFAST, Disp: 30 tablet, Rfl: 6   ketoconazole (NIZORAL) 2 % cream, Apply to skin folds once to twice daily for rash., Disp: 60 g, Rfl: 3   losartan (COZAAR) 100 MG tablet, TAKE 1 TABLET BY MOUTH DAILY, Disp: 30 tablet, Rfl: 6   metoprolol succinate (TOPROL-XL) 100 MG 24 hr tablet, TAKE 1 TABLET BY MOUTH DAILY, Disp: 30 tablet, Rfl: 3   metoprolol succinate (TOPROL-XL) 100 MG 24 hr tablet, TAKE 1 TABLET BY MOUTH DAILY, Disp: 30 tablet, Rfl: 3   metoprolol succinate (TOPROL-XL) 100 MG 24 hr tablet, TAKE 1 TABLET BY MOUTH DAILY, Disp: 30 tablet, Rfl: 3   metoprolol succinate (TOPROL-XL) 100 MG 24 hr tablet, TAKE 1 TABLET BY MOUTH DAILY, Disp: 30 tablet, Rfl: 3   Multiple Vitamins-Minerals (PRESERVISION  AREDS 2 PO), Take by mouth., Disp: , Rfl:    nystatin-triamcinolone ointment (MYCOLOG), Apply 1 application topically 2 (two) times daily., Disp: 30 g, Rfl: 0   omeprazole (PRILOSEC) 20 MG capsule, Take by mouth., Disp: , Rfl:    ondansetron (ZOFRAN ODT) 4 MG disintegrating tablet, Take 1 tablet (4 mg total) by mouth every 8 (eight) hours as needed for nausea or vomiting., Disp: 20 tablet, Rfl: 0   ONE TOUCH ULTRA TEST test strip, , Disp: , Rfl: 0   pantoprazole (PROTONIX) 40 MG tablet, TAKE 1 TABLET(40 MG) BY MOUTH DAILY, Disp: 30 tablet,  Rfl: 6   phenazopyridine (PYRIDIUM) 95 MG tablet, Take 1 tablet (95 mg total) by mouth 3 (three) times daily as needed for pain., Disp: 90 tablet, Rfl: 0   Phenazopyridine HCl (AZO TABS PO), Take by mouth., Disp: , Rfl:    simvastatin (ZOCOR) 40 MG tablet, Take 1 tablet (40 mg total) by mouth daily., Disp: 90 tablet, Rfl: 3   Allergies  Allergen Reactions   Chlorhexidine Itching   Poison Oak Extract [Poison Oak Extract]    Ramipril Other (See Comments)    IRREGULAR HEART BEAT   Penicillins Rash    Has patient had a PCN reaction causing immediate rash, facial/tongue/throat swelling, SOB or lightheadedness with hypotension: Yes Has patient had a PCN reaction causing severe rash involving mucus membranes or skin necrosis: Yes Has patient had a PCN reaction that required hospitalization No Has patient had a PCN reaction occurring within the last 10 years: No If all of the above answers are "NO", then may proceed with Cephalosporin use.    Sulfa Antibiotics Rash    ROS Review of Systems  Constitutional: Negative.  Negative for fever.  HENT: Negative.  Negative for ear pain.   Eyes: Negative.   Respiratory:  Positive for shortness of breath. Negative for cough, choking, chest tightness and wheezing.   Cardiovascular: Negative.  Negative for chest pain, claudication and PND.  Gastrointestinal: Negative.  Negative for abdominal pain.  Endocrine: Negative.    Genitourinary: Negative.  Negative for flank pain.  Musculoskeletal: Negative.  Negative for neck pain.  Skin: Negative.   Allergic/Immunologic: Negative.   Neurological: Negative.  Negative for seizures and headaches.  Hematological: Negative.   Psychiatric/Behavioral: Negative.  Negative for dysphoric mood.   All other systems reviewed and are negative.     Objective:    Physical Exam Vitals reviewed.  Constitutional:      Appearance: Normal appearance.  HENT:     Mouth/Throat:     Mouth: Mucous membranes are moist.  Eyes:     Pupils: Pupils are equal, round, and reactive to light.  Neck:     Vascular: No carotid bruit.  Cardiovascular:     Rate and Rhythm: Normal rate and regular rhythm.     Pulses: Normal pulses.     Heart sounds: Normal heart sounds.  Pulmonary:     Effort: Pulmonary effort is normal.     Breath sounds: Normal breath sounds.  Abdominal:     General: Bowel sounds are normal.     Palpations: Abdomen is soft. There is no hepatomegaly, splenomegaly or mass.     Tenderness: There is no abdominal tenderness.     Hernia: No hernia is present.  Musculoskeletal:        General: No tenderness.     Cervical back: Neck supple.     Right lower leg: No edema.     Left lower leg: No edema.  Skin:    Findings: No rash.  Neurological:     Mental Status: She is alert and oriented to person, place, and time.     Motor: No weakness.  Psychiatric:        Mood and Affect: Mood and affect normal.        Behavior: Behavior normal.     BP 132/84   Pulse 86   Ht '5\' 9"'$  (1.753 m)   Wt 271 lb 3.2 oz (123 kg)   BMI 40.05 kg/m  Wt Readings from Last 3 Encounters:  03/03/22 271 lb 3.2 oz (  123 kg)  01/20/22 264 lb 6.4 oz (119.9 kg)  10/22/21 261 lb 9.6 oz (118.7 kg)     Health Maintenance Due  Topic Date Due   FOOT EXAM  Never done   COVID-19 Vaccine (3 - Moderna risk series) 09/05/2019   HEMOGLOBIN A1C  10/30/2021    There are no preventive care  reminders to display for this patient.  Lab Results  Component Value Date   TSH 2.19 04/05/2020   Lab Results  Component Value Date   WBC 17.1 (H) 10/06/2020   HGB 14.2 10/06/2020   HCT 42.6 10/06/2020   MCV 92.2 10/06/2020   PLT 203 10/06/2020   Lab Results  Component Value Date   NA 138 10/12/2020   K 4.6 10/12/2020   CO2 28 10/12/2020   GLUCOSE 236 (H) 10/12/2020   BUN 20 10/12/2020   CREATININE 1.05 (H) 10/12/2020   BILITOT 0.8 10/06/2020   ALKPHOS 108 10/06/2020   AST 26 10/06/2020   ALT 17 10/06/2020   PROT 7.0 10/06/2020   ALBUMIN 4.0 10/06/2020   CALCIUM 9.2 10/12/2020   ANIONGAP 12 10/06/2020   Lab Results  Component Value Date   CHOL 169 04/05/2020   Lab Results  Component Value Date   HDL 53 04/05/2020   Lab Results  Component Value Date   LDLCALC 97 04/05/2020   Lab Results  Component Value Date   TRIG 99 04/05/2020   Lab Results  Component Value Date   CHOLHDL 3.2 04/05/2020   Lab Results  Component Value Date   HGBA1C 7.2 (A) 05/01/2021      Assessment & Plan:   Problem List Items Addressed This Visit       Cardiovascular and Mediastinum   Benign essential HTN     Patient denies any chest pain or shortness of breath there is no history of palpitation or paroxysmal nocturnal dyspnea   patient was advised to follow low-salt low-cholesterol diet    ideally I want to keep systolic blood pressure below 130 mmHg, patient was asked to check blood pressure one times a week and give me a report on that.  Patient will be follow-up in 3 months  or earlier as needed, patient will call me back for any change in the cardiovascular symptoms Patient was advised to buy a book from local bookstore concerning blood pressure and read several chapters  every day.  This will be supplemented by some of the material we will give him from the office.  Patient should also utilize other resources like YouTube and Internet to learn more about the blood pressure  and the diet.      Relevant Orders   CBC with Differential/Platelet   COMPLETE METABOLIC PANEL WITH GFR   TSH     Endocrine   Controlled type 2 diabetes mellitus without complication ([place]) - Primary    - The patient's blood sugar is labile on med. - The patient will continue the current treatment regimen.  - I encouraged the patient to regularly check blood sugar.  - I encouraged the patient to monitor diet. I encouraged the patient to eat low-carb and low-sugar to help prevent blood sugar spikes.  - I encouraged the patient to continue following their prescribed treatment plan for diabetes - I informed the patient to get help if blood sugar drops below '54mg'$ /dL, or if suddenly have trouble thinking clearly or breathing.  Patient was advised to buy a book on diabetes from a local bookstore or from  [place].  Patient should read 2 chapters every day to keep the motivation going, this is in addition to some of the materials we provided them from the office.  There are other resources on the Internet like YouTube and wilkipedia to get an education on the diabetes      Relevant Orders   Hemoglobin A1c   POCT glucose (manual entry) (Completed)   Ambulatory referral to Podiatry     Other   Dyslipidemia    Hypercholesterolemia  I advised the patient to follow Mediterranean diet This diet is rich in fruits vegetables and whole grain, and This diet is also rich in fish and lean meat Patient should also eat a handful of almonds or walnuts daily Recent heart study indicated that average follow-up on this kind of diet reduces the cardiovascular mortality by 50 to 70%==      Relevant Orders   Lipid panel   Class 3 severe obesity due to excess calories in adult [facility])    Behavioral modification strategies: increasing lean protein intake, decreasing simple carbohydrates, increasing vegetables, increasing water intake, decreasing eating out, no skipping meals, meal planning and cooking strategies,  keeping healthy foods in the home and planning for success.       No orders of the defined types were placed in this encounter.   Follow-up: No follow-ups on file.    Cletis [place], MD

## 2022-03-03 NOTE — Assessment & Plan Note (Signed)

## 2022-03-04 LAB — CBC WITH DIFFERENTIAL/PLATELET
Absolute Monocytes: 502 cells/uL (ref 200–950)
Basophils Absolute: 74 cells/uL (ref 0–200)
Basophils Relative: 0.8 %
Eosinophils Absolute: 47 cells/uL (ref 15–500)
Eosinophils Relative: 0.5 %
HCT: 36.8 % (ref 35.0–45.0)
Hemoglobin: 12.4 g/dL (ref 11.7–15.5)
Lymphs Abs: 1349 cells/uL (ref 850–3900)
MCH: 31.2 pg (ref 27.0–33.0)
MCHC: 33.7 g/dL (ref 32.0–36.0)
MCV: 92.5 fL (ref 80.0–100.0)
MPV: 12.9 fL — ABNORMAL HIGH (ref 7.5–12.5)
Monocytes Relative: 5.4 %
Neutro Abs: 7328 cells/uL (ref 1500–7800)
Neutrophils Relative %: 78.8 %
Platelets: 173 10*3/uL (ref 140–400)
RBC: 3.98 10*6/uL (ref 3.80–5.10)
RDW: 12 % (ref 11.0–15.0)
Total Lymphocyte: 14.5 %
WBC: 9.3 10*3/uL (ref 3.8–10.8)

## 2022-03-04 LAB — LIPID PANEL
Cholesterol: 131 mg/dL (ref <200)
HDL: 47 mg/dL — ABNORMAL LOW (ref >=50)
LDL Cholesterol (Calc): 67 mg/dL (calc)
Non-HDL Cholesterol (Calc): 84 mg/dL (calc) (ref <130)
Total CHOL/HDL Ratio: 2.8 (calc) (ref <5.0)
Triglycerides: 91 mg/dL (ref <150)

## 2022-03-04 LAB — COMPLETE METABOLIC PANEL WITH GFR
AG Ratio: 1.8 (calc) (ref 1.0–2.5)
ALT: 33 U/L — ABNORMAL HIGH (ref 6–29)
AST: 29 U/L (ref 10–35)
Albumin: 4.1 g/dL (ref 3.6–5.1)
Alkaline phosphatase (APISO): 115 U/L (ref 37–153)
BUN/Creatinine Ratio: 20 (calc) (ref 6–22)
BUN: 21 mg/dL (ref 7–25)
CO2: 22 mmol/L (ref 20–32)
Calcium: 8.9 mg/dL (ref 8.6–10.4)
Chloride: 105 mmol/L (ref 98–110)
Creat: 1.04 mg/dL — ABNORMAL HIGH (ref 0.60–0.95)
Globulin: 2.3 g/dL (calc) (ref 1.9–3.7)
Glucose, Bld: 215 mg/dL — ABNORMAL HIGH (ref 65–99)
Potassium: 4.7 mmol/L (ref 3.5–5.3)
Sodium: 139 mmol/L (ref 135–146)
Total Bilirubin: 1 mg/dL (ref 0.2–1.2)
Total Protein: 6.4 g/dL (ref 6.1–8.1)
eGFR: 51 mL/min/{1.73_m2} — ABNORMAL LOW (ref >=60)

## 2022-03-04 LAB — T3: T3, Total: 95 ng/dL (ref 76–181)

## 2022-03-04 LAB — HEMOGLOBIN A1C
Hgb A1c MFr Bld: 8.1 % of total Hgb — ABNORMAL HIGH (ref <5.7)
Mean Plasma Glucose: 186 mg/dL
eAG (mmol/L): 10.3 mmol/L

## 2022-03-04 LAB — T4: T4, Total: 6.8 ug/dL (ref 5.1–11.9)

## 2022-03-04 LAB — TSH: TSH: 1.91 mIU/L (ref 0.40–4.50)

## 2022-03-05 ENCOUNTER — Encounter: Payer: Self-pay | Admitting: Internal Medicine

## 2022-03-05 ENCOUNTER — Other Ambulatory Visit: Payer: Self-pay | Admitting: *Deleted

## 2022-03-05 MED ORDER — ALBUTEROL SULFATE HFA 108 (90 BASE) MCG/ACT IN AERS: 2.0000 | INHALATION_SPRAY | Freq: Four times a day (QID) | RESPIRATORY_TRACT | 2 refills | Status: DC | PRN

## 2022-03-08 ENCOUNTER — Inpatient Hospital Stay
Admission: EM | Admit: 2022-03-08 | Discharge: 2022-03-12 | DRG: 308 | Disposition: A | Discharge status: Skilled Nursing Facility | Payer: Medicare Other | Attending: Internal Medicine | Admitting: Internal Medicine

## 2022-03-08 ENCOUNTER — Ambulatory Visit (INDEPENDENT_AMBULATORY_CARE_PROVIDER_SITE_OTHER): Payer: Medicare Other

## 2022-03-08 ENCOUNTER — Other Ambulatory Visit: Payer: Self-pay

## 2022-03-08 ENCOUNTER — Emergency Department: Payer: Medicare Other

## 2022-03-08 ENCOUNTER — Ambulatory Visit
Admission: EM | Admit: 2022-03-08 | Discharge: 2022-03-08 | Disposition: A | Discharge status: Home / Self Care | Payer: Medicare Other | Attending: Urgent Care | Admitting: Urgent Care

## 2022-03-08 DIAGNOSIS — R000 Tachycardia, unspecified: Secondary | ICD-10-CM

## 2022-03-08 DIAGNOSIS — Z882 Allergy status to sulfonamides status: Secondary | ICD-10-CM | POA: Diagnosis not present

## 2022-03-08 DIAGNOSIS — R079 Chest pain, unspecified: Secondary | ICD-10-CM | POA: Diagnosis not present

## 2022-03-08 DIAGNOSIS — Z6841 Body Mass Index (BMI) 40.0 and over, adult: Secondary | ICD-10-CM

## 2022-03-08 DIAGNOSIS — E1169 Type 2 diabetes mellitus with other specified complication: Secondary | ICD-10-CM

## 2022-03-08 DIAGNOSIS — N179 Acute kidney failure, unspecified: Secondary | ICD-10-CM | POA: Diagnosis not present

## 2022-03-08 DIAGNOSIS — I081 Rheumatic disorders of both mitral and tricuspid valves: Secondary | ICD-10-CM | POA: Diagnosis present

## 2022-03-08 DIAGNOSIS — J9601 Acute respiratory failure with hypoxia: Secondary | ICD-10-CM | POA: Diagnosis not present

## 2022-03-08 DIAGNOSIS — R531 Weakness: Secondary | ICD-10-CM | POA: Diagnosis not present

## 2022-03-08 DIAGNOSIS — I499 Cardiac arrhythmia, unspecified: Secondary | ICD-10-CM

## 2022-03-08 DIAGNOSIS — I130 Hypertensive heart and chronic kidney disease with heart failure and stage 1 through stage 4 chronic kidney disease, or unspecified chronic kidney disease: Secondary | ICD-10-CM | POA: Diagnosis not present

## 2022-03-08 DIAGNOSIS — Z8249 Family history of ischemic heart disease and other diseases of the circulatory system: Secondary | ICD-10-CM | POA: Diagnosis not present

## 2022-03-08 DIAGNOSIS — E1165 Type 2 diabetes mellitus with hyperglycemia: Secondary | ICD-10-CM | POA: Diagnosis not present

## 2022-03-08 DIAGNOSIS — I10 Essential (primary) hypertension: Secondary | ICD-10-CM | POA: Diagnosis present

## 2022-03-08 DIAGNOSIS — I5033 Acute on chronic diastolic (congestive) heart failure: Secondary | ICD-10-CM | POA: Diagnosis not present

## 2022-03-08 DIAGNOSIS — E785 Hyperlipidemia, unspecified: Secondary | ICD-10-CM | POA: Diagnosis present

## 2022-03-08 DIAGNOSIS — Z833 Family history of diabetes mellitus: Secondary | ICD-10-CM

## 2022-03-08 DIAGNOSIS — R0602 Shortness of breath: Secondary | ICD-10-CM | POA: Diagnosis not present

## 2022-03-08 DIAGNOSIS — K219 Gastro-esophageal reflux disease without esophagitis: Secondary | ICD-10-CM | POA: Diagnosis present

## 2022-03-08 DIAGNOSIS — Z888 Allergy status to other drugs, medicaments and biological substances status: Secondary | ICD-10-CM | POA: Diagnosis not present

## 2022-03-08 DIAGNOSIS — I4891 Unspecified atrial fibrillation: Secondary | ICD-10-CM | POA: Diagnosis not present

## 2022-03-08 DIAGNOSIS — N1831 Chronic kidney disease, stage 3a: Secondary | ICD-10-CM | POA: Diagnosis present

## 2022-03-08 DIAGNOSIS — J90 Pleural effusion, not elsewhere classified: Secondary | ICD-10-CM | POA: Diagnosis not present

## 2022-03-08 DIAGNOSIS — J189 Pneumonia, unspecified organism: Secondary | ICD-10-CM

## 2022-03-08 DIAGNOSIS — N183 Chronic kidney disease, stage 3 unspecified: Secondary | ICD-10-CM | POA: Diagnosis not present

## 2022-03-08 DIAGNOSIS — N3090 Cystitis, unspecified without hematuria: Secondary | ICD-10-CM | POA: Diagnosis not present

## 2022-03-08 DIAGNOSIS — Z79899 Other long term (current) drug therapy: Secondary | ICD-10-CM

## 2022-03-08 DIAGNOSIS — E1122 Type 2 diabetes mellitus with diabetic chronic kidney disease: Secondary | ICD-10-CM | POA: Diagnosis present

## 2022-03-08 DIAGNOSIS — Z7982 Long term (current) use of aspirin: Secondary | ICD-10-CM | POA: Diagnosis not present

## 2022-03-08 DIAGNOSIS — I509 Heart failure, unspecified: Secondary | ICD-10-CM

## 2022-03-08 DIAGNOSIS — E119 Type 2 diabetes mellitus without complications: Secondary | ICD-10-CM | POA: Diagnosis not present

## 2022-03-08 DIAGNOSIS — Z66 Do not resuscitate: Secondary | ICD-10-CM | POA: Diagnosis not present

## 2022-03-08 DIAGNOSIS — Z880 Allergy status to penicillin: Secondary | ICD-10-CM | POA: Diagnosis not present

## 2022-03-08 DIAGNOSIS — Z7984 Long term (current) use of oral hypoglycemic drugs: Secondary | ICD-10-CM | POA: Diagnosis not present

## 2022-03-08 DIAGNOSIS — Z1152 Encounter for screening for COVID-19: Secondary | ICD-10-CM | POA: Diagnosis not present

## 2022-03-08 DIAGNOSIS — Z91048 Other nonmedicinal substance allergy status: Secondary | ICD-10-CM | POA: Diagnosis not present

## 2022-03-08 DIAGNOSIS — Z9049 Acquired absence of other specified parts of digestive tract: Secondary | ICD-10-CM | POA: Diagnosis not present

## 2022-03-08 DIAGNOSIS — Z79890 Hormone replacement therapy (postmenopausal): Secondary | ICD-10-CM

## 2022-03-08 DIAGNOSIS — Z87440 Personal history of urinary (tract) infections: Secondary | ICD-10-CM

## 2022-03-08 LAB — COMPREHENSIVE METABOLIC PANEL
ALT: 36 U/L (ref 0–44)
AST: 32 U/L (ref 15–41)
Albumin: 3.8 g/dL (ref 3.5–5.0)
Alkaline Phosphatase: 113 U/L (ref 38–126)
Anion gap: 8 (ref 5–15)
BUN: 23 mg/dL (ref 8–23)
CO2: 23 mmol/L (ref 22–32)
Calcium: 9 mg/dL (ref 8.9–10.3)
Chloride: 107 mmol/L (ref 98–111)
Creatinine, Ser: 1.26 mg/dL — ABNORMAL HIGH (ref 0.44–1.00)
GFR, Estimated: 41 mL/min — ABNORMAL LOW (ref >60)
Glucose, Bld: 212 mg/dL — ABNORMAL HIGH (ref 70–99)
Potassium: 4.4 mmol/L (ref 3.5–5.1)
Sodium: 138 mmol/L (ref 135–145)
Total Bilirubin: 1.1 mg/dL (ref 0.3–1.2)
Total Protein: 6.7 g/dL (ref 6.5–8.1)

## 2022-03-08 LAB — URINALYSIS, ROUTINE W REFLEX MICROSCOPIC
Bilirubin Urine: NEGATIVE
Glucose, UA: NEGATIVE mg/dL
Hgb urine dipstick: NEGATIVE
Ketones, ur: NEGATIVE mg/dL
Leukocytes,Ua: NEGATIVE
Nitrite: NEGATIVE
Protein, ur: NEGATIVE mg/dL
Specific Gravity, Urine: 1.004 — ABNORMAL LOW (ref 1.005–1.030)
pH: 5 (ref 5.0–8.0)

## 2022-03-08 LAB — TSH: TSH: 1.993 u[IU]/mL (ref 0.350–4.500)

## 2022-03-08 LAB — CBC WITH DIFFERENTIAL/PLATELET
Abs Immature Granulocytes: 0.04 10*3/uL (ref 0.00–0.07)
Basophils Absolute: 0.1 10*3/uL (ref 0.0–0.1)
Basophils Relative: 1 %
Eosinophils Absolute: 0.1 10*3/uL (ref 0.0–0.5)
Eosinophils Relative: 1 %
HCT: 37.2 % (ref 36.0–46.0)
Hemoglobin: 12.1 g/dL (ref 12.0–15.0)
Immature Granulocytes: 0 %
Lymphocytes Relative: 14 %
Lymphs Abs: 1.3 10*3/uL (ref 0.7–4.0)
MCH: 30.6 pg (ref 26.0–34.0)
MCHC: 32.5 g/dL (ref 30.0–36.0)
MCV: 94.2 fL (ref 80.0–100.0)
Monocytes Absolute: 0.5 10*3/uL (ref 0.1–1.0)
Monocytes Relative: 6 %
Neutro Abs: 7 10*3/uL (ref 1.7–7.7)
Neutrophils Relative %: 78 %
Platelets: 192 10*3/uL (ref 150–400)
RBC: 3.95 MIL/uL (ref 3.87–5.11)
RDW: 13.2 % (ref 11.5–15.5)
WBC: 8.9 10*3/uL (ref 4.0–10.5)
nRBC: 0 % (ref 0.0–0.2)

## 2022-03-08 LAB — CBG MONITORING, ED
Glucose-Capillary: 137 mg/dL — ABNORMAL HIGH (ref 70–99)
Glucose-Capillary: 173 mg/dL — ABNORMAL HIGH (ref 70–99)

## 2022-03-08 LAB — TROPONIN I (HIGH SENSITIVITY)
Troponin I (High Sensitivity): 14 ng/L (ref <18)
Troponin I (High Sensitivity): 16 ng/L (ref <18)

## 2022-03-08 LAB — RESP PANEL BY RT-PCR (FLU A&B, COVID) ARPGX2
Influenza A by PCR: NEGATIVE
Influenza B by PCR: NEGATIVE
SARS Coronavirus 2 by RT PCR: NEGATIVE

## 2022-03-08 LAB — BRAIN NATRIURETIC PEPTIDE: B Natriuretic Peptide: 495.3 pg/mL — ABNORMAL HIGH (ref 0.0–100.0)

## 2022-03-08 MED ORDER — AZITHROMYCIN 250 MG PO TABS: ORAL_TABLET | ORAL | 0 refills | Status: DC

## 2022-03-08 MED ORDER — ALBUTEROL SULFATE (2.5 MG/3ML) 0.083% IN NEBU
2.5000 mg | INHALATION_SOLUTION | Freq: Once | RESPIRATORY_TRACT | Status: AC
  Administered 2022-03-08: 2.5 mg via RESPIRATORY_TRACT

## 2022-03-08 MED ORDER — ONDANSETRON HCL 4 MG/2ML IJ SOLN: 4.0000 mg | Freq: Four times a day (QID) | INTRAMUSCULAR | Status: DC | PRN

## 2022-03-08 MED ORDER — DILTIAZEM HCL-DEXTROSE 125-5 MG/125ML-% IV SOLN (PREMIX)
5.0000 mg/h | INTRAVENOUS | Status: DC
  Administered 2022-03-08: 5 mg/h via INTRAVENOUS
  Administered 2022-03-09: 10 mg/h via INTRAVENOUS
  Administered 2022-03-09: 15 mg/h via INTRAVENOUS
  Filled 2022-03-08 (×3): qty 125

## 2022-03-08 MED ORDER — ACETAMINOPHEN 325 MG PO TABS
650.0000 mg | ORAL_TABLET | ORAL | Status: DC | PRN
  Administered 2022-03-09: 650 mg via ORAL
  Filled 2022-03-08: qty 2

## 2022-03-08 MED ORDER — APIXABAN 5 MG PO TABS
5.0000 mg | ORAL_TABLET | Freq: Two times a day (BID) | ORAL | Status: DC
  Administered 2022-03-08 – 2022-03-12 (×8): 5 mg via ORAL
  Filled 2022-03-08 (×8): qty 1

## 2022-03-08 MED ORDER — LOSARTAN POTASSIUM 50 MG PO TABS: 100.0000 mg | ORAL_TABLET | Freq: Every day | ORAL | Status: DC

## 2022-03-08 MED ORDER — CEFDINIR 300 MG PO CAPS: 300.0000 mg | ORAL_CAPSULE | Freq: Two times a day (BID) | ORAL | 0 refills | Status: DC

## 2022-03-08 MED ORDER — FUROSEMIDE 10 MG/ML IJ SOLN
40.0000 mg | Freq: Once | INTRAMUSCULAR | Status: AC
  Administered 2022-03-08: 40 mg via INTRAVENOUS
  Filled 2022-03-08: qty 4

## 2022-03-08 MED ORDER — DILTIAZEM HCL 25 MG/5ML IV SOLN
10.0000 mg | Freq: Once | INTRAVENOUS | Status: AC
  Administered 2022-03-08: 10 mg via INTRAVENOUS
  Filled 2022-03-08: qty 5

## 2022-03-08 MED ORDER — SIMVASTATIN 40 MG PO TABS
40.0000 mg | ORAL_TABLET | Freq: Every day | ORAL | Status: DC
  Administered 2022-03-08 – 2022-03-09 (×2): 40 mg via ORAL
  Filled 2022-03-08 (×3): qty 1

## 2022-03-08 MED ORDER — INSULIN ASPART 100 UNIT/ML IJ SOLN
0.0000 [IU] | Freq: Three times a day (TID) | INTRAMUSCULAR | Status: DC
  Administered 2022-03-08 – 2022-03-09 (×3): 3 [IU] via SUBCUTANEOUS
  Administered 2022-03-09: 8 [IU] via SUBCUTANEOUS
  Administered 2022-03-10: 3 [IU] via SUBCUTANEOUS
  Administered 2022-03-10 – 2022-03-11 (×2): 5 [IU] via SUBCUTANEOUS
  Administered 2022-03-11 – 2022-03-12 (×3): 3 [IU] via SUBCUTANEOUS
  Administered 2022-03-12: 5 [IU] via SUBCUTANEOUS
  Filled 2022-03-08 (×10): qty 1

## 2022-03-08 MED ORDER — PANTOPRAZOLE SODIUM 40 MG PO TBEC
40.0000 mg | DELAYED_RELEASE_TABLET | Freq: Every day | ORAL | Status: DC
  Administered 2022-03-09 – 2022-03-12 (×4): 40 mg via ORAL
  Filled 2022-03-08 (×5): qty 1

## 2022-03-08 MED ORDER — ASPIRIN 81 MG PO TBEC
81.0000 mg | DELAYED_RELEASE_TABLET | Freq: Every day | ORAL | Status: DC
  Administered 2022-03-08 – 2022-03-10 (×3): 81 mg via ORAL
  Filled 2022-03-08 (×3): qty 1

## 2022-03-08 NOTE — Consult Note (Signed)
CARDIOLOGY CONSULT NOTE               Patient ID: Victoria Dalton MRN: [id number] DOB/AGE: 23-Feb-1931 88 y.o.  Admit date: 03/08/2022 Referring Physician Dr Denman George Primary Physician Dr Rinaldo Cloud Primary Cardiologist Masoud Reason for Consultation congestive heart failure rapid atrial fibrillation shortness of breath  HPI: 88 year old history of hypertension diabetes frequent UTIs obesity shortness of breath patient had rapid atrial fibrillation and heart failure type symptoms patient developed dyspnea shortness of breath over the last 2 weeks is gotten progressively worse no fever reduced energy worsening lower extremity swelling patient states she just did not feel well still presented to emergency room was found to be tachycardic hypoxic and rapid atrial fibrillation.  Patient was treated with supplemental oxygen and diuretics inhalers rate control placed on anticoagulants cardiology was consulted for further evaluation  Review of systems complete and found to be negative unless listed above     Past Medical History:  Diagnosis Date   Diabetes mellitus without complication ([place])    Dyspnea    Gallbladder calculus    GERD (gastroesophageal reflux disease)    Hyperlipidemia    Hypertension    Hypertensive retinopathy    OU   Kidney stone    Macular degeneration    Dry OU   Mass    Parotid swelling    UTI (lower urinary tract infection)     Past Surgical History:  Procedure Laterality Date   CATARACT EXTRACTION Bilateral 02/2020   Dr. Vevelyn Royals   CHOLECYSTECTOMY N/A 04/30/2016   Procedure: CHOLECYSTECTOMY;  Surgeon: Jules Husbands, MD;  Location: [facility] ORS;  Service: General;  Laterality: N/A;   DIAGNOSTIC LAPAROSCOPIC LIVER BIOPSY  02/28/2016   Procedure: DIAGNOSTIC LAPAROSCOPIC LIVER BIOPSY;  Surgeon: Jules Husbands, MD;  Location: [facility] ORS;  Service: General;;   ERCP N/A 01/13/2016   Procedure: ENDOSCOPIC RETROGRADE CHOLANGIOPANCREATOGRAPHY (ERCP);   Surgeon: Lucilla Lame, MD;  Location: [facility] ENDOSCOPY;  Service: Endoscopy;  Laterality: N/A;   ERCP N/A 04/08/2016   Procedure: ENDOSCOPIC RETROGRADE CHOLANGIOPANCREATOGRAPHY (ERCP) Stent removal;  Surgeon: Lucilla Lame, MD;  Location: [facility] ENDOSCOPY;  Service: Endoscopy;  Laterality: N/A;   EYE SURGERY Bilateral 02/2020   Cat Sx - Dr. Vevelyn Royals   INTRAOPERATIVE CHOLANGIOGRAM  04/30/2016   Procedure: INTRAOPERATIVE CHOLANGIOGRAM;  Surgeon: Jules Husbands, MD;  Location: [facility] ORS;  Service: General;;   KNEE ARTHROSCOPY Left    LAPAROSCOPY  02/28/2016   Procedure: Diagnotic laparoscopy with omental biopsy;  Surgeon: Jules Husbands, MD;  Location: [facility] ORS;  Service: General;;   TONSILLECTOMY      (Not in a hospital admission)  Social History   Socioeconomic History   Marital status: Widowed    Spouse name: Not on file   Number of children: Not on file   Years of education: Not on file   Highest education level: Not on file  Occupational History   Not on file  Tobacco Use   Smoking status: Never   Smokeless tobacco: Never  Vaping Use   Vaping Use: Never used  Substance and Sexual Activity   Alcohol use: No   Drug use: No   Sexual activity: Not Currently  Other Topics Concern   Not on file  Social History Narrative   Not on file   Social Determinants of Health   Financial Resource Strain: Low Risk  (02/21/2021)   Overall Financial Resource Strain (CARDIA)    Difficulty of Paying Living Expenses: Not very  hard  Food Insecurity: No Food Insecurity (02/21/2021)   Hunger Vital Sign    Worried About Running Out of Food in the Last Year: Never true    Ran Out of Food in the Last Year: Never true  Transportation Needs: No Transportation Needs (02/21/2021)   PRAPARE - Hydrologist (Medical): No    Lack of Transportation (Non-Medical): No  Physical Activity: Inactive (02/21/2021)   Exercise Vital Sign    Days of Exercise per Week: 0 days    Minutes of  Exercise per Session: 0 min  Stress: No Stress Concern Present (02/21/2021)   [place]    Feeling of Stress : Not at all  Social Connections: Socially Isolated (02/21/2021)   Social Connection and Isolation Panel [NHANES]    Frequency of Communication with Friends and Family: More than three times a week    Frequency of Social Gatherings with Friends and Family: More than three times a week    Attends Religious Services: Never    Marine scientist or Organizations: No    Attends Archivist Meetings: Never    Marital Status: Widowed  Intimate Partner Violence: Not At Risk (02/21/2021)   Humiliation, Afraid, Rape, and Kick questionnaire    Fear of Current or Ex-Partner: No    Emotionally Abused: No    Physically Abused: No    Sexually Abused: No    Family History  Problem Relation Age of Onset   Cancer Father    Heart attack Father    Cancer Brother    Diabetes Brother    Diabetes Son    Breast cancer Neg Hx       Review of systems complete and found to be negative unless listed above      PHYSICAL EXAM  General: Well developed, well nourished, in no acute distress HEENT:  Normocephalic and atramatic Neck:  No JVD.  Lungs: Clear bilaterally to auscultation and percussion. Heart: HRRR . Normal S1 and S2 without gallops or murmurs.  Abdomen: Bowel sounds are positive, abdomen soft and non-tender  Msk:  Back normal, normal gait. Normal strength and tone for age. Extremities: No clubbing, cyanosis or edema.   Neuro: Alert and oriented X 3. Psych:  Good affect, responds appropriately  Labs:   Lab Results  Component Value Date   WBC 8.9 03/08/2022   HGB 12.1 03/08/2022   HCT 37.2 03/08/2022   MCV 94.2 03/08/2022   PLT 192 03/08/2022    Recent Labs  Lab 03/08/22 1124  NA 138  K 4.4  CL 107  CO2 23  BUN 23  CREATININE 1.26*  CALCIUM 9.0  PROT 6.7  BILITOT 1.1  ALKPHOS 113  ALT  36  AST 32  GLUCOSE 212*   Lab Results  Component Value Date   TROPONINI <0.03 06/05/2016    Lab Results  Component Value Date   CHOL 131 03/03/2022   CHOL 169 04/05/2020   Lab Results  Component Value Date   HDL 47 (L) 03/03/2022   HDL 53 04/05/2020   Lab Results  Component Value Date   LDLCALC 67 03/03/2022   LDLCALC 97 04/05/2020   Lab Results  Component Value Date   TRIG 91 03/03/2022   TRIG 99 04/05/2020   Lab Results  Component Value Date   CHOLHDL 2.8 03/03/2022   CHOLHDL 3.2 04/05/2020   No results found for: "LDLDIRECT"    Radiology: [facility]  Chest 2 View  Result Date: 03/08/2022 CLINICAL DATA:  Chest pain and shortness of breath. EXAM: CHEST - 2 VIEW COMPARISON:  Earlier sameDay. FINDINGS: Low lung volumes. Bibasilar collapse/consolidation is associated with small bilateral pleural effusions. Interstitial markings are diffusely coarsened with chronic features. The visualized bony structures of the thorax are unremarkable. Telemetry leads overlie the chest. IMPRESSION: Low volume film with bibasilar collapse/consolidation and small bilateral pleural effusions. Electronically Signed   By: Misty Stanley M.D.   On: 03/08/2022 11:59   DG Chest 2 View  Result Date: 03/08/2022 CLINICAL DATA:  Shortness of breath EXAM: CHEST - 2 VIEW COMPARISON:  10/14/2015 FINDINGS: Heart size is mildly enlarged. Aortic atherosclerosis. Small right pleural effusion. Streaky bibasilar opacities, right greater than left. No pneumothorax. IMPRESSION: 1. Small right pleural effusion. 2. Streaky bibasilar opacities, right greater than left, may reflect atelectasis and/or pneumonia. Electronically Signed   By: Davina Poke D.O.   On: 03/08/2022 10:14   OCT, Retina - OU - Both Eyes  Result Date: 02/10/2022 Right Eye Quality was good. Central Foveal Thickness: 239. Progression has been stable. Findings include normal foveal contour, no IRF, no SRF, retinal drusen , subretinal hyper-reflective  material, outer retinal atrophy (persistent focal central SRHM -- vitelliform like lesion; no central SRF, patchy ORA centrally). Left Eye Quality was good. Central Foveal Thickness: 245. Progression has been stable. Findings include normal foveal contour, no IRF, no SRF, subretinal hyper-reflective material, outer retinal atrophy (focal central SRHM; no SRF, patchy ORA centrally). Notes *Images captured and stored on drive Diagnosis / Impression: NFP, no IRF, no SRF OU OD: persistent focal central SRHM -- vitelliform like lesion; patchy ORA OS: focal central SRHM; patchy ORA Clinical management: See below Abbreviations: NFP - Normal foveal profile. CME - cystoid macular edema. PED - pigment epithelial detachment. IRF - intraretinal fluid. SRF - subretinal fluid. EZ - ellipsoid zone. ERM - epiretinal membrane. ORA - outer retinal atrophy. ORT - outer retinal tubulation. SRHM - subretinal hyper-reflective material. IRHM - intraretinal hyper-reflective material    EKG: Atrial fibrillation rapid ventricular response nonspecific ST-T wave changes  ASSESSMENT AND PLAN:  Congestive heart failure Atrial fibrillation rapid ventricular response Diabetes type 2 Obesity Hypertension Shortness of breath Lower extremity edema GERD Hyperlipidemia . Plan Agree with admit to telemetry rule out myocardial infarction follow-up EKGs Recommend rate control for atrial fibrillation consider anticoagulant possibly antiarrhythmic Recommend heart failure therapy with diuretics ACE ARB consider adding spironolactone beta-blocker Agree with echocardiogram for assessment of left ventricular function and valvular structures wall motion Supplemental oxygen as necessary for shortness of breath hypoxemia Continue inhalers for shortness of breath dyspnea Recommend sleep study for evaluation assessment of obstructive sleep apnea Cystitis recommend continue antibiotic therapy for UTI Agree with diabetes management and control  as well Statin therapy for hyperlipidemia Consider functional study as an inpatient outpatient prior to discharge  Signed: Yolonda Kida MD, 03/08/2022, 4:41 PM

## 2022-03-08 NOTE — Assessment & Plan Note (Signed)
Ms. Platts is presenting with new onset atrial fibrillation with RVR.  Per chart review, no prior history of such.  She not experiencing any shortness of breath or palpitations at this time, however I wonder if her dyspnea on exertion may be secondary to atrial fibrillation. CHA2DS2-VASc elevated at 5. HAS-BLED 1.    Given symptoms of dyspnea on exertion with lower extremity edema, I am concerned for underlying acute heart failure.  In this setting, diltiazem may not be ideal.  Due to this, cardiology has been consulted and stat echocardiogram ordered.  For now, patient is tolerating diltiazem well.  - Telemetry monitoring - Continue diltiazem infusion for goal HR of <105 - Start Eliquis 5 mg twice daily

## 2022-03-08 NOTE — Discharge Instructions (Signed)
Please follow-up with your primary care provider following treatment in the ED for heart rate control.

## 2022-03-08 NOTE — Assessment & Plan Note (Signed)
In the setting of acute heart failure and A-fib with RVR.  I suspect with diuresis, symptoms will resolve.  -Continue supplemental oxygen to maintain O2 saturation above 92%

## 2022-03-08 NOTE — Assessment & Plan Note (Signed)
-   Continue home simvastatin 

## 2022-03-08 NOTE — Evaluation (Signed)
Occupational Therapy Evaluation Patient Details Name: Victoria Dalton MRN: [id number] DOB: [birth year] Today's Date: 03/08/2022   History of Present Illness Victoria Dalton is a 88 y.o. female with past medical history of hypertension, hyperlipidemia, diabetes who presents with shortness of breath.  Patient notes that over the last about 2 weeks she has had wheezing.  She has no history of COPD or asthma.  She feels short of breath on exertion and occasionally short of breath lying flat.  Has had mild cough no fevers or chills.  Denies chest pain.  Not aware of any lower extremity swelling.  Saw her doctor on Thursday who prescribed her an albuterol inhaler she has never taken beta agonist in the past.  Thinks inhaler helps somewhat but return to [facility] today and was found to be tachycardic.  She has no history of cardiac disease.   Clinical Impression   Patient seen for OT evaluation, daughter present. Patient presenting with decreased independence in self care, balance, functional mobility/transfers, and endurance. At baseline, pt is Mod I for ADLs, receives assist from daughter for IADLs, and is Mod I for functional mobility using rollator. Pt currently functioning at Mod A +2 for supine<>sit and Max A for LB dressing. Evaluation limited this date 2/2 pt being tachycardic (HR in 100s-130 in supine, up to 140s with activity). Patient will benefit from acute OT to increase overall independence in the areas of ADLs and functional mobility in order to safely discharge to next venue of care. Upon hospital discharge, recommend STR to maximize pt safety and return to PLOF.     Recommendations for follow up therapy are one component of a multi-disciplinary discharge planning process, led by the attending physician.  Recommendations may be updated based on patient status, additional functional criteria and insurance authorization.   Follow Up Recommendations  Skilled nursing-short term rehab  (<3 hours/day)    Assistance Recommended at Discharge Frequent or constant Supervision/Assistance  Patient can return home with the following Two people to help with walking and/or transfers;A lot of help with bathing/dressing/bathroom;Assistance with cooking/housework;Assist for transportation;Help with stairs or ramp for entrance    Functional Status Assessment  Patient has had a recent decline in their functional status and demonstrates the ability to make significant improvements in function in a reasonable and predictable amount of time.  Equipment Recommendations  Other (comment) (defer to next venue of care)    Recommendations for Other Services       Precautions / Restrictions Precautions Precautions: Fall Precaution Comments: tachycardic Restrictions Weight Bearing Restrictions: No      Mobility Bed Mobility Overal bed mobility: Needs Assistance Bed Mobility: Supine to Sit, Sit to Supine     Supine to sit: Mod assist, +2 for physical assistance Sit to supine: Mod assist, +2 for physical assistance   General bed mobility comments: pt getting cramp in L side with bed mobility, taking rest break then HR spiked to 140s, returned pt to supine    Transfers                   General transfer comment: deferred 2/2 tachycardic      Balance Overall balance assessment: Needs assistance     Sitting balance - Comments: unable to fully assess 2/2 sudden increase in HR                                   ADL  either performed or assessed with clinical judgement   ADL Overall ADL's : Needs assistance/impaired Eating/Feeding: Set up;Bed level   Grooming: Set up;Bed level               Lower Body Dressing: Maximal assistance;Bed level                       Vision Baseline Vision/History: 1 Wears glasses Patient Visual Report: No change from baseline       Perception     Praxis      Pertinent Vitals/Pain Pain Assessment Pain  Assessment: No/denies pain     Hand Dominance     Extremity/Trunk Assessment Upper Extremity Assessment Upper Extremity Assessment: Generalized weakness   Lower Extremity Assessment Lower Extremity Assessment: Generalized weakness       Communication Communication Communication: HOH (has hearing aids)   Cognition Arousal/Alertness: Awake/alert Behavior During Therapy: WFL for tasks assessed/performed Overall Cognitive Status: Within Functional Limits for tasks assessed                                       General Comments  HR in 100s-130 in supine, up to 140s with activity    Exercises Other Exercises Other Exercises: OT provided education re: role of OT, OT POC, post acute recs, sitting up for all meals, EOB/OOB mobility with assistance, home/fall safety.     Shoulder Instructions      Home Living Family/patient expects to be discharged to:: Private residence Living Arrangements: Alone Available Help at Discharge: Family;Available PRN/intermittently Type of Home: House Home Access: Ramped entrance     Home Layout: One level     Bathroom Shower/Tub: Tub/shower unit         Home Equipment: Grab bars - tub/shower;Tub bench;Rollator (4 wheels) (lifeline, lift chair, hospital bed (doesn't use))          Prior Functioning/Environment Prior Level of Function : Independent/Modified Independent             Mobility Comments: Mod I with rollator for household distances, denies falls ADLs Comments: Mod I for ADLs (completes bathing/dressing in sitting), assist for IADLs (daughter provides transportation and delivers groceries), IND medication management        OT Problem List: Decreased strength;Decreased activity tolerance;Impaired balance (sitting and/or standing);Pain;Cardiopulmonary status limiting activity      OT Treatment/Interventions: Self-care/ADL training;Therapeutic exercise;Patient/family education;Balance training;Energy  conservation;Therapeutic activities;DME and/or AE instruction    OT Goals(Current goals can be found in the care plan section) Acute Rehab OT Goals Patient Stated Goal: get stronger OT Goal Formulation: With patient/family Time For Goal Achievement: 03/22/22 Potential to Achieve Goals: Good ADL Goals Pt Will Perform Grooming: with supervision;sitting Pt Will Perform Upper Body Dressing: with supervision;sitting Pt Will Perform Lower Body Dressing: with min assist;with adaptive equipment;sit to/from stand;sitting/lateral leans Pt Will Transfer to Toilet: with min assist;stand pivot transfer;bedside commode Pt Will Perform Toileting - Clothing Manipulation and hygiene: with min assist;with adaptive equipment;sit to/from stand  OT Frequency: Min 2X/week    Co-evaluation              AM-PAC OT "6 Clicks" Daily Activity     Outcome Measure Help from another person eating meals?: A Little Help from another person taking care of personal grooming?: A Little Help from another person toileting, which includes using toliet, bedpan, or urinal?: A Lot Help from another person bathing (including  washing, rinsing, drying)?: A Lot Help from another person to put on and taking off regular upper body clothing?: A Little Help from another person to put on and taking off regular lower body clothing?: A Lot 6 Click Score: 15   End of Session Equipment Utilized During Treatment: Oxygen (2L O2 via [place]) Nurse Communication: Mobility status;Other (comment) (tachycardic)  Activity Tolerance: Treatment limited secondary to medical complications (Comment);Patient limited by fatigue Patient left: in bed;with call bell/phone within reach;with family/visitor present  OT Visit Diagnosis: Other abnormalities of gait and mobility (R26.89);Muscle weakness (generalized) (M62.81);Pain                Time: 1534-1601 OT Time Calculation (min): 27 min Charges:  OT General Charges $OT Visit: 1 Visit OT  Evaluation $OT Eval Low Complexity: 1 Low  [facility] MS, OTR/L ascom [phone]  03/08/22, 7:27 PM

## 2022-03-08 NOTE — ED Provider Notes (Signed)
[facility] Provider Note    Event Date/Time   First MD Initiated Contact with Patient 03/08/22 1124     (approximate)   History   Shortness of Breath   HPI  Victoria Dalton is a 88 y.o. female with past medical history of hypertension, hyperlipidemia, diabetes who presents with shortness of breath.  Patient notes that over the last about 2 weeks she has had wheezing.  She has no history of COPD or asthma.  She feels short of breath on exertion and occasionally short of breath lying flat.  Has had mild cough no fevers or chills.  Denies chest pain.  Not aware of any lower extremity swelling.  Saw her doctor on Thursday who prescribed her an albuterol inhaler she has never taken beta agonist in the past.  Thinks inhaler helps somewhat but return to [place] [facility] today and was found to be tachycardic.  She has no history of cardiac disease.     Past Medical History:  Diagnosis Date   Diabetes mellitus without complication ([place])    Dyspnea    Gallbladder calculus    GERD (gastroesophageal reflux disease)    Hyperlipidemia    Hypertension    Hypertensive retinopathy    OU   Kidney stone    Macular degeneration    Dry OU   Mass    Parotid swelling    UTI (lower urinary tract infection)     Patient Active Problem List   Diagnosis Date Noted   Lymph node enlargement 05/01/2021   Need for shingles vaccine 05/01/2021   Gastroenteritis 10/12/2020   Candidiasis of breast 05/31/2020   Need for influenza vaccination 03/29/2020   Class 3 severe obesity due to excess calories in adult [facility]) 11/16/2019   Cystitis 11/16/2019   Vaginitis 10/25/2019   Blood in stool    Gallbladder mass    Cholangitis    Cholecystitis    Right upper quadrant pain    Hyperbilirubinemia    Protein-calorie malnutrition, severe 01/11/2016   Cholecystitis with cholelithiasis 01/10/2016   Benign essential HTN 12/06/2015   Dyslipidemia 05/23/2015   Gastro-esophageal reflux  disease without esophagitis 05/08/2014   Controlled type 2 diabetes mellitus without complication ([place]) 41/93/7902   Type 2 diabetes mellitus without complication ([place]) 40/97/3532     Physical Exam  Triage Vital Signs: ED Triage Vitals  Enc Vitals Group     BP 03/08/22 1108 117/88     Pulse Rate 03/08/22 1105 (!) 153     Resp 03/08/22 1105 18     Temp 03/08/22 1105 98.4 F (36.9 C)     Temp Source 03/08/22 1105 Oral     SpO2 03/08/22 1105 (!) 88 %     Weight 03/08/22 1106 271 lb (122.9 kg)     Height --      Head Circumference --      Peak Flow --      Pain Score 03/08/22 1106 0     Pain Loc --      Pain Edu? --      Excl. in [place]? --     Most recent vital signs: Vitals:   03/08/22 1215 03/08/22 1230  BP:    Pulse: 76 (!) 112  Resp:    Temp:    SpO2: 95% 96%     General: Awake, no distress.  CV:  Good peripheral perfusion.  1+ pitting edema bilateral lower extremities Resp:  Normal effort.  Lung sounds are clear no  increased work of breathing no wheezing Abd:  No distention.  Neuro:             Awake, Alert, Oriented x 3  Other:  Heart rate is irregular and tachycardic   ED Results / Procedures / Treatments  Labs (all labs ordered are listed, but only abnormal results are displayed) Labs Reviewed  COMPREHENSIVE METABOLIC PANEL - Abnormal; Notable for the following components:      Result Value   Glucose, Bld 212 (*)    Creatinine, Ser 1.26 (*)    GFR, Estimated 41 (*)    All other components within normal limits  BRAIN NATRIURETIC PEPTIDE - Abnormal; Notable for the following components:   B Natriuretic Peptide 495.3 (*)    All other components within normal limits  RESP PANEL BY RT-PCR (FLU A&B, COVID) ARPGX2  CBC WITH DIFFERENTIAL/PLATELET  TSH  TROPONIN I (HIGH SENSITIVITY)  TROPONIN I (HIGH SENSITIVITY)     EKG  Afib VR, RBBB, nml intervals, no acute ischemic changes   RADIOLOGY Chest x-ray reviewed and interpreted by myself shows  cardiomegaly fluid in the fissure pulmonary edema   PROCEDURES:  Critical Care performed: Yes, see critical care procedure note(s)  .1-3 Lead EKG Interpretation  Performed by: Rada Hay, MD Authorized by: Rada Hay, MD     Interpretation: abnormal     ECG rate assessment: tachycardic     Rhythm: atrial fibrillation     Ectopy: none     Conduction: normal   .Critical Care  Performed by: Rada Hay, MD Authorized by: Rada Hay, MD   Critical care provider statement:    Critical care time (minutes):  30   Critical care was time spent personally by me on the following activities:  Development of treatment plan with patient or surrogate, discussions with consultants, evaluation of patient's response to treatment, examination of patient, ordering and review of laboratory studies, ordering and review of radiographic studies, ordering and performing treatments and interventions, pulse oximetry, re-evaluation of patient's condition and review of old charts   The patient is on the cardiac monitor to evaluate for evidence of arrhythmia and/or significant heart rate changes.   MEDICATIONS ORDERED IN ED: Medications  diltiazem (CARDIZEM) 125 mg in dextrose 5% 125 mL (1 mg/mL) infusion (has no administration in time range)  furosemide (LASIX) injection 40 mg (has no administration in time range)  diltiazem (CARDIZEM) injection 10 mg (10 mg Intravenous Given 03/08/22 1156)     IMPRESSION / MDM / ASSESSMENT AND PLAN / ED COURSE  I reviewed the triage vital signs and the nursing notes.                              Patient's presentation is most consistent with acute presentation with potential threat to life or bodily function.  Differential diagnosis includes, but is not limited to, new onset CHF, tachycardia induced cardiomyopathy, acute coronary syndrome, bronchospasm, pneumonia, viral syndrome  The patient is an 88 year old female with underlying  hypertension and diabetes who presents with 2 weeks of wheezing and dyspnea on exertion.  She is noted to be in A-fib with RVR on arrival with room air sats in the high 80s 88%.  Placed on 3 L nasal cannula satting in the mid 90s.  EKG showing A-fib RVR with right bundle branch block.  She notes about 2-week history of wheezing and was recently prescribed an albuterol inhaler though she  has no history of COPD or asthma.  She does note some orthopnea denies lower extremity swelling.  On exam overall she appears well she has no increased work of breathing lungs sound generally clear without obvious wheezing and she does have some pitting edema.  Chest x-ray looks wet to me.  I suspect either CHF with resultant A-fib or tachycardia induced cardiomyopathy.  We will give gentle rate control likely IV Lasix patient will require admission.  Patient did have response to the Dilt but then heart rate went back up to the 120s.  Will start on dill drip.  We will give a dose of IV Lasix as well I suspect that her CHF is in part driving the A-fib.  Labs notable for elevated BNP     FINAL CLINICAL IMPRESSION(S) / ED DIAGNOSES   Final diagnoses:  Atrial fibrillation with RVR (HCC)  Acute respiratory failure with hypoxia ([place])     Rx / DC Orders   ED Discharge Orders     None        Note:  This document was prepared using Dragon voice recognition software and may include unintentional dictation errors.   Rada Hay, MD 03/08/22 1250

## 2022-03-08 NOTE — ED Notes (Signed)
Patient is being discharged from the Urgent Care and sent to the Emergency Department via POV . Per Annie Main Immordino, patient is in need of higher level of care due to Physicians assessment . Patient is aware and verbalizes understanding of plan of care.  Vitals:   03/08/22 0851  BP: (!) 124/90  Pulse: (!) 116  Resp: 18  Temp: 98.8 F (37.1 C)  SpO2: 90%

## 2022-03-08 NOTE — Assessment & Plan Note (Signed)
Patient has a history of frequent UTIs.  She is experiencing mild urinary burning at this time.  - Urinalysis pending - If urinalysis results concerning for UTI, will order urine culture at that time

## 2022-03-08 NOTE — ED Notes (Signed)
Patient transported to X-ray 

## 2022-03-08 NOTE — ED Provider Notes (Addendum)
[name]    CSN: [id number] Arrival date & time: 03/08/22  5465      History   Chief Complaint Chief Complaint  Patient presents with   Shortness of Breath    HPI Victoria Dalton is a 88 y.o. female.    Shortness of Breath   Presents to UC with complaint of shortness of breath x1 week.  Patient was seen by her PCP last week and started on an inhaler (albuterol).  She reports wheezing which has worsened.  Visit notes from her PCP visit indicates symptoms of SOB starting 1 to 4 weeks prior to that visit (03/03/2022).  She denies any history of asthma or COPD.  Denies history of smoking.  Past Medical History:  Diagnosis Date   Diabetes mellitus without complication ([place])    Dyspnea    Gallbladder calculus    GERD (gastroesophageal reflux disease)    Hyperlipidemia    Hypertension    Hypertensive retinopathy    OU   Kidney stone    Macular degeneration    Dry OU   Mass    Parotid swelling    UTI (lower urinary tract infection)     Patient Active Problem List   Diagnosis Date Noted   Lymph node enlargement 05/01/2021   Need for shingles vaccine 05/01/2021   Gastroenteritis 10/12/2020   Candidiasis of breast 05/31/2020   Need for influenza vaccination 03/29/2020   Class 3 severe obesity due to excess calories in adult ([place]) 11/16/2019   Cystitis 11/16/2019   Vaginitis 10/25/2019   Blood in stool    Gallbladder mass    Cholangitis    Cholecystitis    Right upper quadrant pain    Hyperbilirubinemia    Protein-calorie malnutrition, severe 01/11/2016   Cholecystitis with cholelithiasis 01/10/2016   Benign essential HTN 12/06/2015   Dyslipidemia 05/23/2015   Gastro-esophageal reflux disease without esophagitis 05/08/2014   Controlled type 2 diabetes mellitus without complication ([place]) 68/04/7516   Type 2 diabetes mellitus without complication ([place]) 00/17/4944    Past Surgical History:  Procedure Laterality Date   CATARACT EXTRACTION  Bilateral 02/2020   Dr. Vevelyn Royals   CHOLECYSTECTOMY N/A 04/30/2016   Procedure: CHOLECYSTECTOMY;  Surgeon: Jules Husbands, MD;  Location: [facility] ORS;  Service: General;  Laterality: N/A;   DIAGNOSTIC LAPAROSCOPIC LIVER BIOPSY  02/28/2016   Procedure: DIAGNOSTIC LAPAROSCOPIC LIVER BIOPSY;  Surgeon: Jules Husbands, MD;  Location: [facility] ORS;  Service: General;;   ERCP N/A 01/13/2016   Procedure: ENDOSCOPIC RETROGRADE CHOLANGIOPANCREATOGRAPHY (ERCP);  Surgeon: Lucilla Lame, MD;  [facility];  Service: Endoscopy;  Laterality: N/A;   ERCP N/A 04/08/2016   Procedure: ENDOSCOPIC RETROGRADE CHOLANGIOPANCREATOGRAPHY (ERCP) Stent removal;  Surgeon: Lucilla Lame, MD;  Location: [facility] ENDOSCOPY;  Service: Endoscopy;  Laterality: N/A;   EYE SURGERY Bilateral 02/2020   Cat Sx - Dr. Vevelyn Royals   INTRAOPERATIVE CHOLANGIOGRAM  04/30/2016   Procedure: INTRAOPERATIVE CHOLANGIOGRAM;  Surgeon: Jules Husbands, MD;  Location: [facility] ORS;  Service: General;;   KNEE ARTHROSCOPY Left    LAPAROSCOPY  02/28/2016   Procedure: Diagnotic laparoscopy with omental biopsy;  Surgeon: Jules Husbands, MD;  Location: [facility] ORS;  Service: General;;   TONSILLECTOMY      OB History   No obstetric history on file.      Home Medications    Prior to Admission medications   Medication Sig Start Date End Date Taking? Authorizing Provider  azithromycin (ZITHROMAX Z-PAK) 250 MG tablet Take 2 tablets (  500 mg) today, then 1 tablet (250 mg) for next 4 days. 03/08/22  Yes Arrielle Mcginn, Annie Main, FNP  cefdinir (OMNICEF) 300 MG capsule Take 1 capsule (300 mg total) by mouth 2 (two) times daily for 7 days. 03/08/22 03/15/22 Yes Joshuajames Moehring, Annie Main, FNP  acetaminophen (TYLENOL) 500 MG tablet Take 500 mg by mouth every 6 (six) hours as needed for mild pain.    [provider]  albuterol (VENTOLIN HFA) 108 (90 Base) MCG/ACT inhaler Inhale 2 puffs into the lungs every 6 (six) hours as needed for wheezing or shortness of breath.  03/05/22   Cletis Athens, MD  aspirin EC 81 MG tablet Take 81 mg by mouth daily.    [provider]  calcium carbonate (OS-CAL) 1250 (500 Ca) MG chewable tablet Chew by mouth.    [provider]  carboxymethylcellulose (REFRESH PLUS) 0.5 % SOLN 1 drop 3 (three) times daily as needed.    [provider]  clotrimazole-betamethasone (LOTRISONE) cream Apply topically in the morning and at bedtime. 10/22/21   Cletis Athens, MD  conjugated estrogens (PREMARIN) vaginal cream Place 1 Applicatorful vaginally 2 (two) times a week. 1 gram vaginally at bedtime twice weekly 02/14/21   Will Bonnet, MD  glimepiride (AMARYL) 4 MG tablet TAKE 1 TABLET(4 MG) BY MOUTH DAILY BEFORE BREAKFAST 10/22/21   Cletis Athens, MD  ketoconazole (NIZORAL) 2 % cream Apply to skin folds once to twice daily for rash. 09/18/21   Brendolyn Patty, MD  losartan (COZAAR) 100 MG tablet TAKE 1 TABLET BY MOUTH DAILY 12/30/21   Cletis Athens, MD  metoprolol succinate (TOPROL-XL) 100 MG 24 hr tablet TAKE 1 TABLET BY MOUTH DAILY 11/23/19   Cletis Athens, MD  metoprolol succinate (TOPROL-XL) 100 MG 24 hr tablet TAKE 1 TABLET BY MOUTH DAILY 07/03/21   Cletis Athens, MD  metoprolol succinate (TOPROL-XL) 100 MG 24 hr tablet TAKE 1 TABLET BY MOUTH DAILY 12/30/21   Cletis Athens, MD  metoprolol succinate (TOPROL-XL) 100 MG 24 hr tablet TAKE 1 TABLET BY MOUTH DAILY 12/30/21   Cletis Athens, MD  Multiple Vitamins-Minerals (PRESERVISION AREDS 2 PO) Take by mouth.    [provider]  nystatin-triamcinolone ointment (MYCOLOG) Apply 1 application topically 2 (two) times daily. 05/31/20   Beckie Salts, FNP  omeprazole (PRILOSEC) 20 MG capsule Take by mouth. 05/08/14   [provider]  ondansetron (ZOFRAN ODT) 4 MG disintegrating tablet Take 1 tablet (4 mg total) by mouth every 8 (eight) hours as needed for nausea or vomiting. 10/07/20   Paulette Blanch, MD  ONE TOUCH ULTRA TEST test strip  11/02/15   [provider]   pantoprazole (PROTONIX) 40 MG tablet TAKE 1 TABLET(40 MG) BY MOUTH DAILY 12/30/21   Cletis Athens, MD  phenazopyridine (PYRIDIUM) 95 MG tablet Take 1 tablet (95 mg total) by mouth 3 (three) times daily as needed for pain. 11/03/19   Cletis Athens, MD  Phenazopyridine HCl (AZO TABS PO) Take by mouth.    [provider]  simvastatin (ZOCOR) 40 MG tablet Take 1 tablet (40 mg total) by mouth daily. 07/03/21   Cletis Athens, MD    Family History Family History  Problem Relation Age of Onset   Cancer Father    Heart attack Father    Cancer Brother    Diabetes Brother    Diabetes Son    Breast cancer Neg Hx     Social History Social History   Tobacco Use   Smoking status: Never   Smokeless  tobacco: Never  Vaping Use   Vaping Use: Never used  Substance Use Topics   Alcohol use: No   Drug use: No     Allergies   Chlorhexidine, Poison oak extract [poison oak extract], Ramipril, Penicillins, and Sulfa antibiotics   Review of Systems Review of Systems  Respiratory:  Positive for shortness of breath.      Physical Exam Triage Vital Signs ED Triage Vitals  Enc Vitals Group     BP 03/08/22 0851 (!) 124/90     Pulse Rate 03/08/22 0851 (!) 116     Resp 03/08/22 0851 18     Temp 03/08/22 0851 98.8 F (37.1 C)     Temp Source 03/08/22 0851 Oral     SpO2 03/08/22 0851 90 %     Weight --      Height --      Head Circumference --      Peak Flow --      Pain Score 03/08/22 0849 0     Pain Loc --      Pain Edu? --      Excl. in [place]? --    No data found.  Updated Vital Signs BP (!) 124/90 (BP Location: Left Arm)   Pulse (!) 116   Temp 98.8 F (37.1 C) (Oral)   Resp 18   SpO2 90%   Visual Acuity Right Eye Distance:   Left Eye Distance:   Bilateral Distance:    Right Eye Near:   Left Eye Near:    Bilateral Near:     Physical Exam Vitals reviewed.  Constitutional:      Appearance: She is well-developed.  Cardiovascular:     Rate and Rhythm: Tachycardia  present. Rhythm irregular.  Pulmonary:     Effort: Pulmonary effort is normal.     Breath sounds: Normal breath sounds.     Comments: Initial wheezing is resolved after albuterol treatment. Skin:    General: Skin is warm and dry.  Neurological:     General: No focal deficit present.     Mental Status: She is alert and oriented to person, place, and time.  Psychiatric:        Mood and Affect: Mood normal.        Behavior: Behavior normal.      UC Treatments / Results  Labs (all labs ordered are listed, but only abnormal results are displayed) Labs Reviewed - No data to display  EKG   Radiology DG Chest 2 View  Result Date: 03/08/2022 CLINICAL DATA:  Shortness of breath EXAM: CHEST - 2 VIEW COMPARISON:  10/14/2015 FINDINGS: Heart size is mildly enlarged. Aortic atherosclerosis. Small right pleural effusion. Streaky bibasilar opacities, right greater than left. No pneumothorax. IMPRESSION: 1. Small right pleural effusion. 2. Streaky bibasilar opacities, right greater than left, may reflect atelectasis and/or pneumonia. Electronically Signed   By: Davina Poke D.O.   On: 03/08/2022 10:14    Procedures Procedures (including critical care time)  Medications Ordered in UC Medications  albuterol (PROVENTIL) (2.5 MG/3ML) 0.083% nebulizer solution 2.5 mg (2.5 mg Nebulization Given 03/08/22 0923)    Initial Impression / Assessment and Plan / UC Course  I have reviewed the triage vital signs and the nursing notes.  Pertinent labs & imaging results that were available during my care of the patient were reviewed by me and considered in my medical decision making (see chart for details).   Patient presents with acute onset of shortness of breath starting 2  to 3 weeks ago. Tachycardia with HR 116, SaO2 = 90%. Wheezing auscultated on the left side of her chest is resolved after a nebulization treatment with albuterol, SaO2 remains 89-92%.  Chest xray obtained and shows:  1. Small  right pleural effusion. 2. Streaky bibasilar opacities, right greater than left, may reflect atelectasis and/or pneumonia.  Will treat possible PNA with antibiotics. Patient reported intolerance to penicillin, however she has used cephalexin and ceftriaxone so will order 3rd gen cephalosporin (cefdinir on her formulary) along with macrolide.  ECG shows undetermined arrhythmia with ventricular rate > 140 bpm. P-wave is not obvious and QRS is split. RBBB is present.  Will recommend patient to be evaluated in the ED for rate control.  Final Clinical Impressions(s) / UC Diagnoses   Final diagnoses:  SOB (shortness of breath)  Tachycardia  Cardiac arrhythmia, unspecified cardiac arrhythmia type  Community acquired pneumonia, unspecified laterality     Discharge Instructions      Please follow-up with your primary care provider following treatment in the ED for heart rate control.     ED Prescriptions     Medication Sig Dispense Auth. Provider   cefdinir (OMNICEF) 300 MG capsule Take 1 capsule (300 mg total) by mouth 2 (two) times daily for 7 days. 14 capsule Kiyanna Biegler, FNP   azithromycin (ZITHROMAX Z-PAK) 250 MG tablet Take 2 tablets (500 mg) today, then 1 tablet (250 mg) for next 4 days. 6 tablet Fidela Cieslak, FNP      PDMP not reviewed this encounter.   Rose Phi, [place] 03/08/22 1027    [place], [place] 03/08/22 1337

## 2022-03-08 NOTE — Assessment & Plan Note (Signed)
Patient has a history of hypertension previously managed with losartan and metoprolol.  Losartan is being held due to AKI and metoprolol is being held due to diltiazem drip.  -Holding losartan and metoprolol

## 2022-03-08 NOTE — H&P (Addendum)
History and Physical    Patient: Victoria Dalton ZOX:[id number] DOB: 07/28/1930 DOA: 03/08/2022 DOS: the patient was seen and examined on 03/08/2022 PCP: Cletis Athens, MD  Patient coming from: Home  Chief Complaint:  Chief Complaint  Patient presents with   Shortness of Breath   HPI: Victoria Dalton is a 88 y.o. female with medical history significant of hypertension, type 2 diabetes, frequent UTI who presents to the ED with complaints of as of breath.  Victoria Dalton states she began to develop dyspnea on exertion approximately 2 weeks ago that has gradually progressed and become severe.  She noticed that she is unable to walk even a few steps around her home at this time without feeling dyspneic.  She denies any orthopnea at this time but endorses bendopnea.  She has noticed during this time increased lower extremity swelling.  For 1 week, she had increased urinary frequency, but her urinary frequency has decreased these past 7 days.  She denies any chest pain, palpitations.  Victoria Dalton denies any recent illnesses.  She denies any prior history of heart failure.  Other symptoms she endorses at this time is mild urinary burning, but otherwise denies fever, chills, nausea, vomiting, diarrhea, abdominal pain, bowel movement changes, focal weakness.  ED course: On arrival to the ED, patient was tachycardic up to 116 with a blood pressure of 124/90.  Due to desaturation below 90%, she was placed on 2 L of supplemental oxygen.  EKG remarkable for atrial fibrillation with RVR.  Initial blood work remarkable for glucose of 212, creatinine of 1.26, BNP of 495.  Troponin negative x2.  Chest x-ray with evidence of pulmonary edema and bilateral pleural effusions.  TRH consulted for admission for new onset A-fib with RVR and heart failure.  Review of Systems: As mentioned in the history of present illness. All other systems reviewed and are negative.  Past Medical History:  Diagnosis Date    Diabetes mellitus without complication ([place])    Dyspnea    Gallbladder calculus    GERD (gastroesophageal reflux disease)    Hyperlipidemia    Hypertension    Hypertensive retinopathy    OU   Kidney stone    Macular degeneration    Dry OU   Mass    Parotid swelling    UTI (lower urinary tract infection)    Past Surgical History:  Procedure Laterality Date   CATARACT EXTRACTION Bilateral 02/2020   Dr. Vevelyn Royals   CHOLECYSTECTOMY N/A 04/30/2016   Procedure: CHOLECYSTECTOMY;  Surgeon: Jules Husbands, MD;  Location: [facility] ORS;  Service: General;  Laterality: N/A;   DIAGNOSTIC LAPAROSCOPIC LIVER BIOPSY  02/28/2016   Procedure: DIAGNOSTIC LAPAROSCOPIC LIVER BIOPSY;  Surgeon: Jules Husbands, MD;  Location: [facility] ORS;  Service: General;;   ERCP N/A 01/13/2016   Procedure: ENDOSCOPIC RETROGRADE CHOLANGIOPANCREATOGRAPHY (ERCP);  Surgeon: Lucilla Lame, MD;  Location: [facility] ENDOSCOPY;  Service: Endoscopy;  Laterality: N/A;   ERCP N/A 04/08/2016   Procedure: ENDOSCOPIC RETROGRADE CHOLANGIOPANCREATOGRAPHY (ERCP) Stent removal;  Surgeon: Lucilla Lame, MD;  Location: [facility] ENDOSCOPY;  Service: Endoscopy;  Laterality: N/A;   EYE SURGERY Bilateral 02/2020   Cat Sx - Dr. Vevelyn Royals   INTRAOPERATIVE CHOLANGIOGRAM  04/30/2016   Procedure: INTRAOPERATIVE CHOLANGIOGRAM;  Surgeon: Jules Husbands, MD;  Location: [facility] ORS;  Service: General;;   KNEE ARTHROSCOPY Left    LAPAROSCOPY  02/28/2016   Procedure: Diagnotic laparoscopy with omental biopsy;  Surgeon: Jules Husbands, MD;  Location: [facility] ORS;  Service:  General;;   TONSILLECTOMY     Social History:  reports that she has never smoked. She has never used smokeless tobacco. She reports that she does not drink alcohol and does not use drugs.  Allergies  Allergen Reactions   Chlorhexidine Itching   Ramipril Other (See Comments)    IRREGULAR HEART BEAT   Penicillins Rash    Has patient had a PCN reaction causing immediate rash, facial/tongue/throat  swelling, SOB or lightheadedness with hypotension: Yes Has patient had a PCN reaction causing severe rash involving mucus membranes or skin necrosis: Yes Has patient had a PCN reaction that required hospitalization No Has patient had a PCN reaction occurring within the last 10 years: No If all of the above answers are "NO", then may proceed with Cephalosporin use.    Poison Oak Extract Rash   Sulfa Antibiotics Rash    Family History  Problem Relation Age of Onset   Cancer Father    Heart attack Father    Cancer Brother    Diabetes Brother    Diabetes Son    Breast cancer Neg Hx     Prior to Admission medications   Medication Sig Start Date End Date Taking? Authorizing Provider  aspirin EC 81 MG tablet Take 81 mg by mouth daily.   Yes [provider]  glimepiride (AMARYL) 4 MG tablet TAKE 1 TABLET(4 MG) BY MOUTH DAILY BEFORE BREAKFAST 10/22/21  Yes Masoud, Viann Shove, MD  losartan (COZAAR) 100 MG tablet TAKE 1 TABLET BY MOUTH DAILY 12/30/21  Yes Masoud, Viann Shove, MD  metoprolol succinate (TOPROL-XL) 100 MG 24 hr tablet TAKE 1 TABLET BY MOUTH DAILY 11/23/19  Yes Cletis Athens, MD  Multiple Vitamins-Minerals (PRESERVISION AREDS 2 PO) Take by mouth.   Yes [provider]  pantoprazole (PROTONIX) 40 MG tablet TAKE 1 TABLET(40 MG) BY MOUTH DAILY 12/30/21  Yes Masoud, Viann Shove, MD  simvastatin (ZOCOR) 40 MG tablet Take 1 tablet (40 mg total) by mouth daily. 07/03/21  Yes Masoud, Viann Shove, MD  acetaminophen (TYLENOL) 500 MG tablet Take 500 mg by mouth every 6 (six) hours as needed for mild pain.    [provider]  albuterol (VENTOLIN HFA) 108 (90 Base) MCG/ACT inhaler Inhale 2 puffs into the lungs every 6 (six) hours as needed for wheezing or shortness of breath. 03/05/22   Cletis Athens, MD  azithromycin (ZITHROMAX Z-PAK) 250 MG tablet Take 2 tablets (500 mg) today, then 1 tablet (250 mg) for next 4 days. Patient not taking: Reported on 03/08/2022 03/08/22   Immordino, Annie Main, FNP   calcium carbonate (OS-CAL) 1250 (500 Ca) MG chewable tablet Chew by mouth. Patient not taking: Reported on 03/08/2022    [provider]  carboxymethylcellulose (REFRESH PLUS) 0.5 % SOLN 1 drop 3 (three) times daily as needed.    [provider]  cefdinir (OMNICEF) 300 MG capsule Take 1 capsule (300 mg total) by mouth 2 (two) times daily for 7 days. Patient not taking: Reported on 03/08/2022 03/08/22 03/15/22  Immordino, Annie Main, FNP  clotrimazole-betamethasone (LOTRISONE) cream Apply topically in the morning and at bedtime. 10/22/21   Cletis Athens, MD  conjugated estrogens (PREMARIN) vaginal cream Place 1 Applicatorful vaginally 2 (two) times a week. 1 gram vaginally at bedtime twice weekly 02/14/21   Will Bonnet, MD  ketoconazole (NIZORAL) 2 % cream Apply to skin folds once to twice daily for rash. 09/18/21   Brendolyn Patty, MD  metoprolol succinate (TOPROL-XL) 100 MG 24 hr tablet TAKE 1 TABLET BY MOUTH DAILY  07/03/21   Cletis Athens, MD  metoprolol succinate (TOPROL-XL) 100 MG 24 hr tablet TAKE 1 TABLET BY MOUTH DAILY 12/30/21   Cletis Athens, MD  metoprolol succinate (TOPROL-XL) 100 MG 24 hr tablet TAKE 1 TABLET BY MOUTH DAILY 12/30/21   Cletis Athens, MD  nystatin-triamcinolone ointment (MYCOLOG) Apply 1 application topically 2 (two) times daily. Patient not taking: Reported on 03/08/2022 05/31/20   Beckie Salts, FNP  omeprazole (PRILOSEC) 20 MG capsule Take by mouth. Patient not taking: Reported on 03/08/2022 05/08/14   [provider]  ondansetron (ZOFRAN ODT) 4 MG disintegrating tablet Take 1 tablet (4 mg total) by mouth every 8 (eight) hours as needed for nausea or vomiting. Patient not taking: Reported on 03/08/2022 10/07/20   Paulette Blanch, MD  ONE [facility] ULTRA TEST test strip  11/02/15   [provider]  phenazopyridine (PYRIDIUM) 95 MG tablet Take 1 tablet (95 mg total) by mouth 3 (three) times daily as needed for pain. Patient not taking: Reported on  03/08/2022 11/03/19   Cletis Athens, MD  Phenazopyridine HCl (AZO TABS PO) Take by mouth. Patient not taking: Reported on 03/08/2022    [provider]    Physical Exam: Vitals:   03/08/22 1602 03/08/22 1619 03/08/22 1630 03/08/22 1700  BP: 132/75  124/86 107/72  Pulse: 81 91 (!) 111 83  Resp: '19 18 18 '$ (!) 25  Temp:      TempSrc:      SpO2: 98% 97% 94% 97%  Weight:       Physical Exam Vitals and nursing note reviewed.  Constitutional:      General: She is not in acute distress.    Appearance: She is obese. She is not toxic-appearing.  HENT:     Head: Normocephalic and atraumatic.  Eyes:     Pupils: Pupils are equal, round, and reactive to light.  Neck:     Vascular: JVD present.  Cardiovascular:     Rate and Rhythm: Tachycardia present. Rhythm irregularly irregular.     Heart sounds: No murmur heard. Pulmonary:     Effort: Pulmonary effort is normal. No tachypnea or accessory muscle usage.     Breath sounds: Normal breath sounds. No decreased breath sounds, wheezing, rhonchi or rales.  Abdominal:     Palpations: Abdomen is soft.     Tenderness: There is no abdominal tenderness. There is no guarding.  Musculoskeletal:     Cervical back: Normal range of motion and neck supple.     Right lower leg: 2+ Pitting Edema present.     Left lower leg: 2+ Pitting Edema present.  Skin:    General: Skin is warm and dry.  Neurological:     General: No focal deficit present.     Mental Status: She is alert and oriented to person, place, and time.  Psychiatric:        Mood and Affect: Mood normal.        Behavior: Behavior normal.    Data Reviewed: CBC completely within normal limits.  CMP remarkable for glucose of 212, creatinine of 1.26, and GFR 41.  TSH within normal limits.  Troponin negative x2.  BNP elevated at 495.  COVID-19 and influenza negative.  Chest x-ray personally reviewed.  Remarkable for bilateral pleural effusions with interstitial markings concerning for  pulmonary edema.  EKG obtained at 1241 personally reviewed.  Remarkable atrial fibrillation with elevated rate at 123.  Right bundle branch block.  Osborne waves noted in precordial leads.  There  are no new results to review at this time.  Assessment and Plan: * Atrial fibrillation with RVR ([place]) Ms. Frees is presenting with new onset atrial fibrillation with RVR.  Per chart review, no prior history of such.  She not experiencing any shortness of breath or palpitations at this time, however I wonder if her dyspnea on exertion may be secondary to atrial fibrillation. CHA2DS2-VASc elevated at 5. HAS-BLED 1.    Given symptoms of dyspnea on exertion with lower extremity edema, I am concerned for underlying acute heart failure.  In this setting, diltiazem may not be ideal.  Due to this, cardiology has been consulted and stat echocardiogram ordered.  For now, patient is tolerating diltiazem well.  - Telemetry monitoring - Continue diltiazem infusion for goal HR of <105 - Start Eliquis 5 mg twice daily  Acute heart failure ([place]) Patient presenting with 2-week history of dyspnea on exertion and lower extremity edema.  BNP elevated at 495.  Primary differential is acute heart failure potentially secondary to A-fib with RVR.  TSH is within normal limits.  No prior history of coronary disease.  She has responded well to 40 mg IV Lasix with approximately 1.5 L out in the last 3 hours.  - Telemetry monitoring - Cardiology consulted; appreciate their recommendations - Continue diuresis with repeat Lasix in the morning - Strict in and outs - Daily weights - Stat echocardiogram pending - Holding home ARB given AKI  Acute respiratory failure with hypoxia ([place]) In the setting of acute heart failure and A-fib with RVR.  I suspect with diuresis, symptoms will resolve.  -Continue supplemental oxygen to maintain O2 saturation above 92%  AKI (acute kidney injury) ([place]) Creatinine elevated on admission  today at 1.26.  Differential includes AKI secondary to congestion.  Given need for diuresis, will monitor renal function carefully.  - Repeat BMP in the morning - Strict in and outs - Holding home losartan  Benign essential HTN Patient has a history of hypertension previously managed with losartan and metoprolol.  Losartan is being held due to AKI and metoprolol is being held due to diltiazem drip.  -Holding losartan and metoprolol  Controlled type 2 diabetes mellitus without complication (HCC) M3W obtained approximately 1 week ago elevated at 8.1%. Currently being managed with glimepiride.  - SSI - Holding home glimepiride  Cystitis Patient has a history of frequent UTIs.  She is experiencing mild urinary burning at this time.  - Urinalysis pending - If urinalysis results concerning for UTI, will order urine culture at that time  Dyslipidemia - Continue home simvastatin   Advance Care Planning:   Code Status: DNR.  Discussed CODE STATUS with Mrs. Sharlett Iles.  She states that at her age, she would not want heroic measures to be taken to extend her life.  She feels happy that she has lived a long life.  She would like to be DNR/DNI.  Consults: Cardiology  Family Communication: Patient's daughter updated at bedside  Severity of Illness: The appropriate patient status for this patient is OBSERVATION. Observation status is judged to be reasonable and necessary in order to provide the required intensity of service to ensure the patient's safety. The patient's presenting symptoms, physical exam findings, and initial radiographic and laboratory data in the context of their medical condition is felt to place them at decreased risk for further clinical deterioration. Furthermore, it is anticipated that the patient will be medically stable for discharge from the hospital within 2 midnights of admission.   Author:  Jose Persia, MD 03/08/2022 6:13 PM  For on call review [email or web address].

## 2022-03-08 NOTE — Assessment & Plan Note (Signed)
A1c obtained approximately 1 week ago elevated at 8.1%. Currently being managed with glimepiride.  - SSI - Holding home glimepiride

## 2022-03-08 NOTE — ED Triage Notes (Signed)
Coming from [facility] for [facility], pt stating this started a few weeks ago, where they had to catch their breath during conversations. PT stating they feel wheezy. On assessment, no wheezing noted, pt o2 sat 93 in triage.  Pt hr 155 in triage palpated. PT denies any pain, pt said they were given albuterol at the clinic and had an EKG.

## 2022-03-08 NOTE — Assessment & Plan Note (Addendum)
Creatinine elevated on admission today at 1.26.  Differential includes AKI secondary to congestion.  Given need for diuresis, will monitor renal function carefully.  - Repeat BMP in the morning - Strict in and outs - Holding home losartan

## 2022-03-08 NOTE — ED Triage Notes (Signed)
Pt. Presents to UC w/ c/o SOB for the past week. Pt. States she was seen by her Primary and started on an inhaler this  past Thursday. Pt. States her wheezing has gotten worse.

## 2022-03-08 NOTE — Assessment & Plan Note (Addendum)
Patient presenting with 2-week history of dyspnea on exertion and lower extremity edema.  BNP elevated at 495.  Primary differential is acute heart failure potentially secondary to A-fib with RVR.  TSH is within normal limits.  No prior history of coronary disease.  She has responded well to 40 mg IV Lasix with approximately 1.5 L out in the last 3 hours.  - Telemetry monitoring - Cardiology consulted; appreciate their recommendations - Continue diuresis with repeat Lasix in the morning - Strict in and outs - Daily weights - Stat echocardiogram pending - Holding home ARB given AKI

## 2022-03-09 ENCOUNTER — Observation Stay
Admit: 2022-03-09 | Discharge: 2022-03-09 | Disposition: A | Discharge status: Home / Self Care | Payer: Medicare Other | Attending: Internal Medicine | Admitting: Internal Medicine

## 2022-03-09 ENCOUNTER — Encounter: Payer: Self-pay | Admitting: Internal Medicine

## 2022-03-09 DIAGNOSIS — Z9049 Acquired absence of other specified parts of digestive tract: Secondary | ICD-10-CM | POA: Diagnosis not present

## 2022-03-09 DIAGNOSIS — Z880 Allergy status to penicillin: Secondary | ICD-10-CM | POA: Diagnosis not present

## 2022-03-09 DIAGNOSIS — Z888 Allergy status to other drugs, medicaments and biological substances status: Secondary | ICD-10-CM | POA: Diagnosis not present

## 2022-03-09 DIAGNOSIS — I509 Heart failure, unspecified: Secondary | ICD-10-CM

## 2022-03-09 DIAGNOSIS — J9601 Acute respiratory failure with hypoxia: Secondary | ICD-10-CM | POA: Diagnosis not present

## 2022-03-09 DIAGNOSIS — E569 Vitamin deficiency, unspecified: Secondary | ICD-10-CM | POA: Diagnosis not present

## 2022-03-09 DIAGNOSIS — E1122 Type 2 diabetes mellitus with diabetic chronic kidney disease: Secondary | ICD-10-CM | POA: Diagnosis present

## 2022-03-09 DIAGNOSIS — N183 Chronic kidney disease, stage 3 unspecified: Secondary | ICD-10-CM | POA: Diagnosis not present

## 2022-03-09 DIAGNOSIS — Z882 Allergy status to sulfonamides status: Secondary | ICD-10-CM | POA: Diagnosis not present

## 2022-03-09 DIAGNOSIS — Z91048 Other nonmedicinal substance allergy status: Secondary | ICD-10-CM | POA: Diagnosis not present

## 2022-03-09 DIAGNOSIS — H04129 Dry eye syndrome of unspecified lacrimal gland: Secondary | ICD-10-CM | POA: Diagnosis not present

## 2022-03-09 DIAGNOSIS — E1165 Type 2 diabetes mellitus with hyperglycemia: Secondary | ICD-10-CM | POA: Diagnosis present

## 2022-03-09 DIAGNOSIS — R21 Rash and other nonspecific skin eruption: Secondary | ICD-10-CM | POA: Diagnosis not present

## 2022-03-09 DIAGNOSIS — Z1152 Encounter for screening for COVID-19: Secondary | ICD-10-CM | POA: Diagnosis not present

## 2022-03-09 DIAGNOSIS — I5033 Acute on chronic diastolic (congestive) heart failure: Secondary | ICD-10-CM | POA: Diagnosis not present

## 2022-03-09 DIAGNOSIS — Z8249 Family history of ischemic heart disease and other diseases of the circulatory system: Secondary | ICD-10-CM | POA: Diagnosis not present

## 2022-03-09 DIAGNOSIS — I10 Essential (primary) hypertension: Secondary | ICD-10-CM | POA: Diagnosis not present

## 2022-03-09 DIAGNOSIS — Z833 Family history of diabetes mellitus: Secondary | ICD-10-CM | POA: Diagnosis not present

## 2022-03-09 DIAGNOSIS — M6259 Muscle wasting and atrophy, not elsewhere classified, multiple sites: Secondary | ICD-10-CM | POA: Diagnosis not present

## 2022-03-09 DIAGNOSIS — R0602 Shortness of breath: Secondary | ICD-10-CM | POA: Diagnosis not present

## 2022-03-09 DIAGNOSIS — N3090 Cystitis, unspecified without hematuria: Secondary | ICD-10-CM | POA: Diagnosis present

## 2022-03-09 DIAGNOSIS — I4891 Unspecified atrial fibrillation: Secondary | ICD-10-CM

## 2022-03-09 DIAGNOSIS — N1831 Chronic kidney disease, stage 3a: Secondary | ICD-10-CM | POA: Diagnosis not present

## 2022-03-09 DIAGNOSIS — E119 Type 2 diabetes mellitus without complications: Secondary | ICD-10-CM

## 2022-03-09 DIAGNOSIS — Z66 Do not resuscitate: Secondary | ICD-10-CM | POA: Diagnosis present

## 2022-03-09 DIAGNOSIS — I2510 Atherosclerotic heart disease of native coronary artery without angina pectoris: Secondary | ICD-10-CM | POA: Diagnosis not present

## 2022-03-09 DIAGNOSIS — I480 Paroxysmal atrial fibrillation: Secondary | ICD-10-CM | POA: Diagnosis not present

## 2022-03-09 DIAGNOSIS — R531 Weakness: Secondary | ICD-10-CM | POA: Diagnosis not present

## 2022-03-09 DIAGNOSIS — I130 Hypertensive heart and chronic kidney disease with heart failure and stage 1 through stage 4 chronic kidney disease, or unspecified chronic kidney disease: Secondary | ICD-10-CM | POA: Diagnosis present

## 2022-03-09 DIAGNOSIS — Z6841 Body Mass Index (BMI) 40.0 and over, adult: Secondary | ICD-10-CM | POA: Diagnosis not present

## 2022-03-09 DIAGNOSIS — K219 Gastro-esophageal reflux disease without esophagitis: Secondary | ICD-10-CM | POA: Diagnosis not present

## 2022-03-09 DIAGNOSIS — E785 Hyperlipidemia, unspecified: Secondary | ICD-10-CM | POA: Diagnosis not present

## 2022-03-09 DIAGNOSIS — N179 Acute kidney failure, unspecified: Secondary | ICD-10-CM | POA: Diagnosis present

## 2022-03-09 DIAGNOSIS — I081 Rheumatic disorders of both mitral and tricuspid valves: Secondary | ICD-10-CM | POA: Diagnosis present

## 2022-03-09 DIAGNOSIS — Z7982 Long term (current) use of aspirin: Secondary | ICD-10-CM | POA: Diagnosis not present

## 2022-03-09 LAB — ECHOCARDIOGRAM COMPLETE
AR max vel: 1.64 cm2
AV Peak grad: 13.6 mmHg
Ao pk vel: 1.85 m/s
Area-P 1/2: 3.5 cm2
Calc EF: 52.2 %
MV M vel: 5.4 m/s
MV Peak grad: 116.6 mmHg
Radius: 0.5 cm
S' Lateral: 3.3 cm
Single Plane A2C EF: 53.8 %
Single Plane A4C EF: 52 %
Weight: 4336 oz

## 2022-03-09 LAB — GLUCOSE, CAPILLARY
Glucose-Capillary: 187 mg/dL — ABNORMAL HIGH (ref 70–99)
Glucose-Capillary: 166 mg/dL — ABNORMAL HIGH (ref 70–99)

## 2022-03-09 LAB — CBC
HCT: 40.3 % (ref 36.0–46.0)
Hemoglobin: 13 g/dL (ref 12.0–15.0)
MCH: 30.5 pg (ref 26.0–34.0)
MCHC: 32.3 g/dL (ref 30.0–36.0)
MCV: 94.6 fL (ref 80.0–100.0)
Platelets: 212 10*3/uL (ref 150–400)
RBC: 4.26 MIL/uL (ref 3.87–5.11)
RDW: 13.3 % (ref 11.5–15.5)
WBC: 11.4 10*3/uL — ABNORMAL HIGH (ref 4.0–10.5)
nRBC: 0 % (ref 0.0–0.2)

## 2022-03-09 LAB — BASIC METABOLIC PANEL
Anion gap: 11 (ref 5–15)
BUN: 20 mg/dL (ref 8–23)
CO2: 27 mmol/L (ref 22–32)
Calcium: 9.2 mg/dL (ref 8.9–10.3)
Chloride: 101 mmol/L (ref 98–111)
Creatinine, Ser: 1.14 mg/dL — ABNORMAL HIGH (ref 0.44–1.00)
GFR, Estimated: 46 mL/min — ABNORMAL LOW (ref >60)
Glucose, Bld: 162 mg/dL — ABNORMAL HIGH (ref 70–99)
Potassium: 4.1 mmol/L (ref 3.5–5.1)
Sodium: 139 mmol/L (ref 135–145)

## 2022-03-09 LAB — CBG MONITORING, ED
Glucose-Capillary: 172 mg/dL — ABNORMAL HIGH (ref 70–99)
Glucose-Capillary: 275 mg/dL — ABNORMAL HIGH (ref 70–99)

## 2022-03-09 LAB — MAGNESIUM: Magnesium: 2.1 mg/dL (ref 1.7–2.4)

## 2022-03-09 MED ORDER — METOPROLOL TARTRATE 25 MG PO TABS
25.0000 mg | ORAL_TABLET | Freq: Three times a day (TID) | ORAL | Status: DC
  Administered 2022-03-09 – 2022-03-10 (×4): 25 mg via ORAL
  Filled 2022-03-09 (×4): qty 1

## 2022-03-09 MED ORDER — SODIUM CHLORIDE 0.9% FLUSH
3.0000 mL | Freq: Two times a day (BID) | INTRAVENOUS | Status: DC
  Administered 2022-03-09 – 2022-03-12 (×6): 3 mL via INTRAVENOUS

## 2022-03-09 MED ORDER — DILTIAZEM HCL-DEXTROSE 125-5 MG/125ML-% IV SOLN (PREMIX)
5.0000 mg/h | INTRAVENOUS | Status: DC
  Administered 2022-03-09: 5 mg/h via INTRAVENOUS
  Filled 2022-03-09: qty 125

## 2022-03-09 MED ORDER — FUROSEMIDE 10 MG/ML IJ SOLN
20.0000 mg | Freq: Two times a day (BID) | INTRAMUSCULAR | Status: DC
  Administered 2022-03-09 – 2022-03-12 (×6): 20 mg via INTRAVENOUS
  Filled 2022-03-09 (×6): qty 2

## 2022-03-09 MED ORDER — DOCUSATE SODIUM 100 MG PO CAPS
100.0000 mg | ORAL_CAPSULE | Freq: Two times a day (BID) | ORAL | Status: DC
  Administered 2022-03-09 – 2022-03-12 (×4): 100 mg via ORAL
  Filled 2022-03-09 (×5): qty 1

## 2022-03-09 NOTE — ED Notes (Signed)
MD in room at this time.

## 2022-03-09 NOTE — Evaluation (Signed)
Physical Therapy Evaluation Patient Details Name: KHUSHBU PIPPEN MRN: [id number] DOB: 05/14/1931 Today's Date: 03/09/2022  History of Present Illness  SHADONNA BENEDICK is a 88 y.o. female with medical history significant for hypertension, hyperlipidemia, diabetes mellitus, history of UTIs, macular degeneration, kidney stones, hypertensive retinopathy, who presented to the hospital with shortness of breath.  This started about 2 weeks ago and has progressively gotten worse.  She also noticed increasing lower extremity swelling in the preceding week prior to admission. She was found to have atrial fibrillation with RVR, acute congestive heart failure complicated by acute hypoxic respiratory failure.   Clinical Impression  Patient received sitting up in bed with her daughter Alyse Low in room with her. Patient alert and oriented and able to provide detailed history along with contribution from [employer]. Patient lives alone in a single story home with a ramp to enter. Prior to hospitalization she was mod I with mobility using a rollator and was mod I with dressing, bathing, cooking, and laundry. Her daughter [name] drove and helped her get groceries. Upon PT evaluation, patient needed mod I for supine to sit, CGA for sit <> stand from bed, from/to BSC over toilet, and to chair using RW. Patient became short of breath easily on 2L/min O2 and HR went from 110 bpm at rest to approx 146 bpm during ambulation of 90 feet with RW. Patient was slightly anxious throughout session and required cuing for safe body and AD positioning while performing mobility tasks. She was provided with a chair follow in case of unstable vitals. She was assisted with toileting and was able to everything with SBA and cuing for moving RW into safe position during transfers. Patient was unsteady on her feet at times. It appears she has experienced a significant decrease in functional mobility and independence and PT recommends short term  rehab prior to return home to improve her safety. She would also benefit from a BSC to put over her toilet to decrease risk of fall when using the bathroom. Patient would benefit from skilled physical therapy to address impairments and functional limitations (see PT Problem List below) to work towards stated goals and return to PLOF or maximal functional independence.        Recommendations for follow up therapy are one component of a multi-disciplinary discharge planning process, led by the attending physician.  Recommendations may be updated based on patient status, additional functional criteria and insurance authorization.  Follow Up Recommendations Skilled nursing-short term rehab (<3 hours/day) Can patient physically be transported by private vehicle: Yes    Assistance Recommended at Discharge    Patient can return home with the following  A little help with walking and/or transfers;A little help with bathing/dressing/bathroom;Assistance with cooking/housework;Help with stairs or ramp for entrance;Assist for transportation    Equipment Recommendations BSC/3in1  Recommendations for Other Services  OT consult    Functional Status Assessment Patient has had a recent decline in their functional status and demonstrates the ability to make significant improvements in function in a reasonable and predictable amount of time.     Precautions / Restrictions Precautions Precautions: Fall Precaution Comments: tachycardic Restrictions Weight Bearing Restrictions: No      Mobility  Bed Mobility Overal bed mobility: Needs Assistance Bed Mobility: Supine to Sit     Supine to sit: Modified independent (Device/Increase time), HOB elevated     General bed mobility comments: patient able to go supine to sit with increased time/effort, use of B UE on bed rails.  Transfers Overall transfer level: Needs assistance Equipment used: Rolling walker (2 wheels) Transfers: Sit to/from  Stand Sit to Stand: Min guard           General transfer comment: patient transfered bed to [facility] to chair + bed/bed x x with SBA-CGA. Left RW behind and needed cuing to reposition walker and body for safety. Did not correct unsafe walker position with verbal cues so PT moved walker into safe position.    Ambulation/Gait Ambulation/Gait assistance: Min guard, +2 safety/equipment Gait Distance (Feet): 90 Feet Assistive device: Rolling walker (2 wheels) Gait Pattern/deviations: Wide base of support, Decreased stride length Gait velocity: decreased     General Gait Details: Patient ambulated approximately 90 feet with RW and CGA while on 2L/min O2 while 2nd person provided chair follow for safety. Pateint with quick fatigue and shortness of breath. HR elevated up to approx 146bpm at times on telemetry. HR returned to around 100bpm at rest following ambulation.  Stairs            Wheelchair Mobility    Modified Rankin (Stroke Patients Only)       Balance Overall balance assessment: Needs assistance Sitting-balance support: No upper extremity supported, Feet supported Sitting balance-Leahy Scale: Good Sitting balance - Comments: steady sitting at ege of bed     Standing balance-Leahy Scale: Fair Standing balance comment: Patient requires B UE support on RW during ambulation but can take hands off RW to pull down/up underwear while using toilet.                             Pertinent Vitals/Pain Pain Assessment Pain Assessment: Faces Faces Pain Scale: Hurts even more Pain Location: coccyx after being in bed so long Pain Descriptors / Indicators: Discomfort, Grimacing Pain Intervention(s): Limited activity within patient's tolerance, Monitored during session, Repositioned    Home Living Family/patient expects to be discharged to:: Private residence Living Arrangements: Alone Available Help at Discharge: Family;Available PRN/intermittently (daughter and  granddaughter available before/after work and at lunch break) Type of Home: House Home Access: [place]: One [place]: [place];Tub bench;Rollator (4 wheels);Rolling Walker (2 wheels) (lifeline, lift chair, hospital bed (doesn't use)) Additional Comments: lift chair    Prior Function Prior Level of Function : Independent/Modified Independent             Mobility Comments: Mod I with rollator for household distances, denies falls in last 6 months ADLs Comments: Mod I for ADLs (completes bathing/dressing in sitting), assist for IADLs (daughter provides transportation and delivers groceries), Indepenent medication management. does not drive     Hand Dominance        Extremity/Trunk Assessment   Upper Extremity Assessment Upper Extremity Assessment: Generalized weakness    Lower Extremity Assessment Lower Extremity Assessment: Generalized weakness    Cervical / Trunk Assessment Cervical / Trunk Assessment: Normal  Communication   Communication: HOH (has hearing aids)  Cognition Arousal/Alertness: Awake/alert Behavior During Therapy: WFL for tasks assessed/performed Overall Cognitive Status: Within Functional Limits for tasks assessed                                 General Comments: Granddaughter Alyse Low present to confirm some details of history        General Comments General comments (skin integrity, edema, etc.): HR near 100 bpm in  supine before and after session. HR up to 146 bpm with activity. SpO2 at approx 92-93% at rest. Initially improved with activity. Not recorded during 90 foot ambulation.    Exercises Other Exercises Other Exercises: patient and granddaughter educated on role of PT in acute care setting, discharge reccomendations, importance of mobility.   Assessment/Plan    PT Assessment Patient needs continued PT services  PT Problem List Decreased strength;Decreased knowledge of use  of DME;Decreased activity tolerance;Decreased balance;Pain;Decreased mobility;Cardiopulmonary status limiting activity       PT Treatment Interventions DME instruction;Balance training;Gait training;Neuromuscular re-education;Stair training;Functional mobility training;Patient/family education;Therapeutic activities;Therapeutic exercise    PT Goals (Current goals can be found in the Care Plan section)  Acute Rehab PT Goals Patient Stated Goal: Get better and go home PT Goal Formulation: With patient/family Time For Goal Achievement: 03/23/22 Potential to Achieve Goals: Good    Frequency Min 2X/week     Co-evaluation               AM-PAC PT "6 Clicks" Mobility  Outcome Measure Help needed turning from your back to your side while in a flat bed without using bedrails?: A Little Help needed moving from lying on your back to sitting on the side of a flat bed without using bedrails?: A Little Help needed moving to and from a bed to a chair (including a wheelchair)?: A Little Help needed standing up from a chair using your arms (e.g., wheelchair or bedside chair)?: A Little Help needed to walk in hospital room?: A Little Help needed climbing 3-5 steps with a railing? : Total 6 Click Score: 16    End of Session Equipment Utilized During Treatment: Gait belt;Oxygen (2L/min) Activity Tolerance: Patient limited by fatigue;Patient tolerated treatment well Patient left: in chair;with call bell/phone within reach;with chair alarm set (no call button available. Notified nursing.) Nurse Communication: Mobility status PT Visit Diagnosis: Difficulty in walking, not elsewhere classified (R26.2);Muscle weakness (generalized) (M62.81);Unsteadiness on feet (R26.81)    Time: 4765-4650 PT Time Calculation (min) (ACUTE ONLY): 58 min   Charges:   PT Evaluation $PT Eval Moderate Complexity: 1 Mod PT Treatments $Gait Training: 8-22 mins $Therapeutic Activity: 8-22 mins        Everlean Alstrom.  Graylon Good, PT, DPT 03/09/22, 4:57 PM  [facility] [address] [place], [place] [address] P: [phone] I F: [phone]

## 2022-03-09 NOTE — Progress Notes (Signed)
*  PRELIMINARY RESULTS* Echocardiogram 2D Echocardiogram has been performed.  Victoria Dalton 03/09/2022, 12:24 PM

## 2022-03-09 NOTE — Progress Notes (Signed)
[facility] Cardiology    SUBJECTIVE: Patient states she feels somewhat better reduce shortness of breath and dyspnea denies any chest pain leg edema is improved she is no longer on supplemental oxygen   Vitals:   03/09/22 0800 03/09/22 0845 03/09/22 0900 03/09/22 0930  BP: (!) 93/51  (!) 109/95 106/67  Pulse: 75 (!) 104 (!) 110 (!) 108  Resp: (!) 22 19 (!) 25 (!) 26  Temp:      TempSrc:      SpO2: 95% 94% 96% 94%  Weight:         Intake/Output Summary (Last 24 hours) at 03/09/2022 1022 Last data filed at 03/09/2022 3295 Gross per 24 hour  Intake 131.06 ml  Output 2100 ml  Net -1968.94 ml      PHYSICAL EXAM  General: Well developed, well nourished, in no acute distress HEENT:  Normocephalic and atramatic Neck:  No JVD.  Lungs: Clear bilaterally to auscultation and percussion. Heart: HRRR . Normal S1 and S2 without gallops or murmurs.  Abdomen: Bowel sounds are positive, abdomen soft and non-tender  Msk:  Back normal, normal gait. Normal strength and tone for age. Extremities: No clubbing, cyanosis or edema.   Neuro: Alert and oriented X 3. Psych:  Good affect, responds appropriately   LABS: Basic Metabolic Panel: Recent Labs    03/08/22 1124 03/09/22 0515  NA 138 139  K 4.4 4.1  CL 107 101  CO2 23 27  GLUCOSE 212* 162*  BUN 23 20  CREATININE 1.26* 1.14*  CALCIUM 9.0 9.2  MG  --  2.1   Liver Function Tests: Recent Labs    03/08/22 1124  AST 32  ALT 36  ALKPHOS 113  BILITOT 1.1  PROT 6.7  ALBUMIN 3.8   No results for input(s): "LIPASE", "AMYLASE" in the last 72 hours. CBC: Recent Labs    03/08/22 1124 03/09/22 0515  WBC 8.9 11.4*  NEUTROABS 7.0  --   HGB 12.1 13.0  HCT 37.2 40.3  MCV 94.2 94.6  PLT 192 212   Cardiac Enzymes: No results for input(s): "CKTOTAL", "CKMB", "CKMBINDEX", "TROPONINI" in the last 72 hours. BNP: Invalid input(s): "POCBNP" D-Dimer: No results for input(s): "DDIMER" in the last 72 hours. Hemoglobin A1C: No results  for input(s): "HGBA1C" in the last 72 hours. Fasting Lipid Panel: No results for input(s): "CHOL", "HDL", "LDLCALC", "TRIG", "CHOLHDL", "LDLDIRECT" in the last 72 hours. Thyroid Function Tests: Recent Labs    03/08/22 1124  TSH 1.993   Anemia Panel: No results for input(s): "VITAMINB12", "FOLATE", "FERRITIN", "TIBC", "IRON", "RETICCTPCT" in the last 72 hours.  DG Chest 2 View  Result Date: 03/08/2022 CLINICAL DATA:  Chest pain and shortness of breath. EXAM: CHEST - 2 VIEW COMPARISON:  Earlier sameDay. FINDINGS: Low lung volumes. Bibasilar collapse/consolidation is associated with small bilateral pleural effusions. Interstitial markings are diffusely coarsened with chronic features. The visualized bony structures of the thorax are unremarkable. Telemetry leads overlie the chest. IMPRESSION: Low volume film with bibasilar collapse/consolidation and small bilateral pleural effusions. Electronically Signed   By: Misty Stanley M.D.   On: 03/08/2022 11:59   DG Chest 2 View  Result Date: 03/08/2022 CLINICAL DATA:  Shortness of breath EXAM: CHEST - 2 VIEW COMPARISON:  10/14/2015 FINDINGS: Heart size is mildly enlarged. Aortic atherosclerosis. Small right pleural effusion. Streaky bibasilar opacities, right greater than left. No pneumothorax. IMPRESSION: 1. Small right pleural effusion. 2. Streaky bibasilar opacities, right greater than left, may reflect atelectasis and/or pneumonia. Electronically Signed  By: Davina Poke D.O.   On: 03/08/2022 10:14     Echo borderline reduced left ventricular function EF around 45 to 50%   TELEMETRY: Atrial fibrillation rapid ventricular response bundle branch block nonspecific ST-T wave changes rate 7130:  ASSESSMENT AND PLAN:  Principal Problem:   Atrial fibrillation with RVR (HCC) Active Problems:   Benign essential HTN   Dyslipidemia   Controlled type 2 diabetes mellitus without complication ([place])   Cystitis   AKI (acute kidney injury) ([place])    Acute heart failure (HCC)   Acute respiratory failure with hypoxia ([place])   Plan Atrial fibrillation rapid ventricular response continue diltiazem IV start metoprolol to help with rate control Anticoagulation for atrial fibrillation should be maintained Chronic renal insufficiency stage III follow-up with nephrology Continue diabetes management and control Statin therapy for hyperlipidemia Supplemental oxygen and diuretics for possible acute congestive heart failure Consider functional study prior to discharge Recommend weight loss exercise portion control    Yolonda Kida, MD 03/09/2022 10:22 AM

## 2022-03-09 NOTE — ED Notes (Signed)
Dr Mal Misty at bedside

## 2022-03-09 NOTE — ED Notes (Signed)
Pt placed back on BP cuff- pt requesting stool softener to help with BM

## 2022-03-09 NOTE — ED Notes (Signed)
Pt received breakfast tray 

## 2022-03-09 NOTE — Progress Notes (Addendum)
Progress Note    Victoria Dalton  HQI:[id number] DOB: 06/07/1930  DOA: 03/08/2022 PCP: Corky Downs, MD      Brief Narrative:    Medical records reviewed and are as summarized below:  Victoria Dalton is a 88 y.o. female with medical history significant for hypertension, hyperlipidemia, diabetes mellitus, history of UTIs, macular degeneration, kidney stones, hypertensive retinopathy, who presented to the hospital with shortness of breath.  This started about 2 weeks ago and has progressively gotten worse. She also noticed increasing lower extremity swelling in the preceding week prior to admission.   She was found to have atrial fibrillation with RVR, acute congestive heart failure complicated by acute hypoxic respiratory failure.    Assessment/Plan:   Principal Problem:   Atrial fibrillation with RVR (HCC) Active Problems:   Acute heart failure (HCC)   Acute respiratory failure with hypoxia (HCC)   Chronic kidney disease (CKD), stage III (moderate) (HCC)   Benign essential HTN   Controlled type 2 diabetes mellitus without complication (HCC)   Dyslipidemia    Body mass index is 40.02 kg/m.  (Morbid obesity)  Acute congestive heart failure: Continue IV Lasix.  2D echo is pending.  Monitor BMP, daily weight and urine output.  Atrial fibrillation with RVR: Continue Cardizem infusion and taper off as able.  She has been started on Eliquis.  CHA2DS2-VASc score is 5.  Follow-up with cardiologist.  Acute hypoxic respiratory failure: She is on 2 L/min oxygen via nasal cannula.  Taper off oxygen as able.  CKD stage IIIa: Creatinine is stable.  Monitor BMP while on IV Lasix.  Urinalysis is not impressive.  Doubt UTI at this time.  Other comorbidities include type II DM (hemoglobin A1c was 8.1% a week prior to admission), dyslipidemia, hypertension   Diet Order             Diet heart healthy/carb modified Room service appropriate? Yes; Fluid consistency: Thin   Diet effective now                            Consultants: Cardiologist  Procedures: None    Medications:    apixaban  5 mg Oral BID   aspirin EC  81 mg Oral Daily   docusate sodium  100 mg Oral BID   insulin aspart  0-15 Units Subcutaneous TID WC   pantoprazole  40 mg Oral Daily   simvastatin  40 mg Oral Daily   Continuous Infusions:  diltiazem (CARDIZEM) infusion 15 mg/hr (03/09/22 0734)     Anti-infectives (From admission, onward)    None        Status is: Observation The patient will require care spanning > 2 midnights and should be moved to inpatient because: Rapid A-fib on IV Cardizem.        Family Communication/Anticipated D/C date and plan/Code Status   DVT prophylaxis:  apixaban (ELIQUIS) tablet 5 mg     Code Status: DNR  Family Communication: Plan discussed with her granddaughter at the bedside  Disposition Plan: Plan to discharge home in 2 to 3 days          Subjective:   Interval events noted.  No palpitations, shortness of breath or chest pain.  Breathing is better today.  Granddaughter was at the bedside  Objective:    Vitals:   03/09/22 0900 03/09/22 0930 03/09/22 1115 03/09/22 1130  BP: (!) 109/95 106/67 102/65 119/89  Pulse: (!) 110 Marland Kitchen)  108 78 (!) 119  Resp: (!) 25 (!) 26 20 (!) 22  Temp:      TempSrc:      SpO2: 96% 94% 95% 94%  Weight:       No data found.   Intake/Output Summary (Last 24 hours) at 03/09/2022 1207 Last data filed at 03/09/2022 0519 Gross per 24 hour  Intake 131.06 ml  Output 2100 ml  Net -1968.94 ml   Filed Weights   03/08/22 1106  Weight: 122.9 kg    Exam:  GEN: NAD SKIN: Warm and dry EYES: No pallor or icterus xc ENT: MMM CV: Irregular rate and rhythm, tachycardic PULM: Bibasilar rales, no wheezing ABD: soft, ND, NT, +BS CNS: AAO x 3, non focal EXT: Trace bilateral leg, no tenderness        Data Reviewed:   I have personally reviewed following labs and  imaging studies:  Labs: Labs show the following:   Basic Metabolic Panel: Recent Labs  Lab 03/03/22 1544 03/08/22 1124 03/09/22 0515  NA 139 138 139  K 4.7 4.4 4.1  CL 105 107 101  CO2 22 23 27   GLUCOSE 215* 212* 162*  BUN 21 23 20   CREATININE 1.04* 1.26* 1.14*  CALCIUM 8.9 9.0 9.2  MG  --   --  2.1   GFR Estimated Creatinine Clearance: 47 mL/min (A) (by C-G formula based on SCr of 1.14 mg/dL (H)). Liver Function Tests: Recent Labs  Lab 03/03/22 1544 03/08/22 1124  AST 29 32  ALT 33* 36  ALKPHOS  --  113  BILITOT 1.0 1.1  PROT 6.4 6.7  ALBUMIN  --  3.8   No results for input(s): "LIPASE", "AMYLASE" in the last 168 hours. No results for input(s): "AMMONIA" in the last 168 hours. Coagulation profile No results for input(s): "INR", "PROTIME" in the last 168 hours.  CBC: Recent Labs  Lab 03/03/22 1544 03/08/22 1124 03/09/22 0515  WBC 9.3 8.9 11.4*  NEUTROABS 7,328 7.0  --   HGB 12.4 12.1 13.0  HCT 36.8 37.2 40.3  MCV 92.5 94.2 94.6  PLT 173 192 212   Cardiac Enzymes: No results for input(s): "CKTOTAL", "CKMB", "CKMBINDEX", "TROPONINI" in the last 168 hours. BNP (last 3 results) No results for input(s): "PROBNP" in the last 8760 hours. CBG: Recent Labs  Lab 03/08/22 1903 03/08/22 2357 03/09/22 0733 03/09/22 1153  GLUCAP 173* 137* 172* 275*   D-Dimer: No results for input(s): "DDIMER" in the last 72 hours. Hgb A1c: No results for input(s): "HGBA1C" in the last 72 hours. Lipid Profile: No results for input(s): "CHOL", "HDL", "LDLCALC", "TRIG", "CHOLHDL", "LDLDIRECT" in the last 72 hours. Thyroid function studies: Recent Labs    03/08/22 1124  TSH 1.993   Anemia work up: No results for input(s): "VITAMINB12", "FOLATE", "FERRITIN", "TIBC", "IRON", "RETICCTPCT" in the last 72 hours. Sepsis Labs: Recent Labs  Lab 03/03/22 1544 03/08/22 1124 03/09/22 0515  WBC 9.3 8.9 11.4*    Microbiology Recent Results (from the past 240 hour(s))  Resp  Panel by RT-PCR (Flu A&B, Covid) Anterior Nasal Swab     Status: None   Collection Time: 03/08/22 12:02 PM   Specimen: Anterior Nasal Swab  Result Value Ref Range Status   SARS Coronavirus 2 by RT PCR NEGATIVE NEGATIVE Final    Comment: (NOTE) SARS-CoV-2 target nucleic acids are NOT DETECTED.  The SARS-CoV-2 RNA is generally detectable in upper respiratory specimens during the acute phase of infection. The lowest concentration of SARS-CoV-2 viral copies this  assay can detect is 138 copies/mL. A negative result does not preclude SARS-Cov-2 infection and should not be used as the sole basis for treatment or other patient management decisions. A negative result may occur with  improper specimen collection/handling, submission of specimen other than nasopharyngeal swab, presence of viral mutation(s) within the areas targeted by this assay, and inadequate number of viral copies(<138 copies/mL). A negative result must be combined with clinical observations, patient history, and epidemiological information. The expected result is Negative.  Fact Sheet for Patients:  BloggerCourse.com  Fact Sheet for Healthcare Providers:  SeriousBroker.it  This test is no t yet approved or cleared by the [place] FDA and  has been authorized for detection and/or diagnosis of SARS-CoV-2 by FDA under an Emergency Use Authorization (EUA). This EUA will remain  in effect (meaning this test can be used) for the duration of the COVID-19 declaration under Section 564(b)(1) of the Act, 21 U.S.C.section 360bbb-3(b)(1), unless the authorization is terminated  or revoked sooner.       Influenza A by PCR NEGATIVE NEGATIVE Final   Influenza B by PCR NEGATIVE NEGATIVE Final    Comment: (NOTE) The Xpert Xpress SARS-CoV-2/FLU/RSV plus assay is intended as an aid in the diagnosis of influenza from Nasopharyngeal swab specimens and should not be used as a sole  basis for treatment. Nasal washings and aspirates are unacceptable for Xpert Xpress SARS-CoV-2/FLU/RSV testing.  Fact Sheet for Patients: BloggerCourse.com  Fact Sheet for Healthcare Providers: SeriousBroker.it  This test is not yet approved or cleared by the [place] FDA and has been authorized for detection and/or diagnosis of SARS-CoV-2 by FDA under an Emergency Use Authorization (EUA). This EUA will remain in effect (meaning this test can be used) for the duration of the COVID-19 declaration under Section 564(b)(1) of the Act, 21 U.S.C. section 360bbb-3(b)(1), unless the authorization is terminated or revoked.  Performed at [facility], [address] [place], [place] [address]     Procedures and diagnostic studies:  DG Chest 2 View  Result Date: 03/08/2022 CLINICAL DATA:  Chest pain and shortness of breath. EXAM: CHEST - 2 VIEW COMPARISON:  Earlier sameDay. FINDINGS: Low lung volumes. Bibasilar collapse/consolidation is associated with small bilateral pleural effusions. Interstitial markings are diffusely coarsened with chronic features. The visualized bony structures of the thorax are unremarkable. Telemetry leads overlie the chest. IMPRESSION: Low volume film with bibasilar collapse/consolidation and small bilateral pleural effusions. Electronically Signed   By: Kennith Center M.D.   On: 03/08/2022 11:59   DG Chest 2 View  Result Date: 03/08/2022 CLINICAL DATA:  Shortness of breath EXAM: CHEST - 2 VIEW COMPARISON:  10/14/2015 FINDINGS: Heart size is mildly enlarged. Aortic atherosclerosis. Small right pleural effusion. Streaky bibasilar opacities, right greater than left. No pneumothorax. IMPRESSION: 1. Small right pleural effusion. 2. Streaky bibasilar opacities, right greater than left, may reflect atelectasis and/or pneumonia. Electronically Signed   By: Duanne Guess D.O.   On: 03/08/2022 10:14                LOS: 0 days   Emanie Behan  [facility]   Pager on [email or web address]. If 7PM-7AM, please contact night-coverage at [email or web address]     03/09/2022, 12:07 PM

## 2022-03-09 NOTE — Progress Notes (Signed)
PT Cancellation Note  Patient Details Name: Victoria Dalton MRN: [id number] DOB: 1930/08/12   Cancelled Treatment:    Reason Eval/Treat Not Completed: Patient at procedure or test/unavailable (Patient initially held in AM due to new Cardizem drip. Patient then unavailable due to being transfered to floor from ED. Will re-attempt at later time/date as appropriate.)   Everlean Alstrom. Graylon Good, PT, DPT 03/09/22, 1:47 PM  Rafael Capo

## 2022-03-09 NOTE — ED Notes (Signed)
Pt assisted to toilet to have a BM

## 2022-03-10 ENCOUNTER — Inpatient Hospital Stay: Payer: Medicare Other

## 2022-03-10 ENCOUNTER — Telehealth (HOSPITAL_COMMUNITY): Payer: Self-pay | Admitting: Pharmacy Technician

## 2022-03-10 ENCOUNTER — Other Ambulatory Visit: Payer: Self-pay

## 2022-03-10 ENCOUNTER — Other Ambulatory Visit (HOSPITAL_COMMUNITY): Payer: Self-pay

## 2022-03-10 DIAGNOSIS — I5033 Acute on chronic diastolic (congestive) heart failure: Secondary | ICD-10-CM

## 2022-03-10 DIAGNOSIS — R0602 Shortness of breath: Secondary | ICD-10-CM | POA: Diagnosis not present

## 2022-03-10 DIAGNOSIS — N1831 Chronic kidney disease, stage 3a: Secondary | ICD-10-CM

## 2022-03-10 LAB — GLUCOSE, CAPILLARY
Glucose-Capillary: 170 mg/dL — ABNORMAL HIGH (ref 70–99)
Glucose-Capillary: 212 mg/dL — ABNORMAL HIGH (ref 70–99)
Glucose-Capillary: 240 mg/dL — ABNORMAL HIGH (ref 70–99)

## 2022-03-10 LAB — CBC
HCT: 37.6 % (ref 36.0–46.0)
Hemoglobin: 12.4 g/dL (ref 12.0–15.0)
MCH: 30.4 pg (ref 26.0–34.0)
MCHC: 33 g/dL (ref 30.0–36.0)
MCV: 92.2 fL (ref 80.0–100.0)
Platelets: 213 10*3/uL (ref 150–400)
RBC: 4.08 MIL/uL (ref 3.87–5.11)
RDW: 13.3 % (ref 11.5–15.5)
WBC: 9.9 10*3/uL (ref 4.0–10.5)
nRBC: 0 % (ref 0.0–0.2)

## 2022-03-10 LAB — BASIC METABOLIC PANEL
Anion gap: 11 (ref 5–15)
BUN: 23 mg/dL (ref 8–23)
CO2: 27 mmol/L (ref 22–32)
Calcium: 8.8 mg/dL — ABNORMAL LOW (ref 8.9–10.3)
Chloride: 98 mmol/L (ref 98–111)
Creatinine, Ser: 1.25 mg/dL — ABNORMAL HIGH (ref 0.44–1.00)
GFR, Estimated: 41 mL/min — ABNORMAL LOW (ref >60)
Glucose, Bld: 184 mg/dL — ABNORMAL HIGH (ref 70–99)
Potassium: 4.1 mmol/L (ref 3.5–5.1)
Sodium: 136 mmol/L (ref 135–145)

## 2022-03-10 LAB — MAGNESIUM: Magnesium: 2.1 mg/dL (ref 1.7–2.4)

## 2022-03-10 MED ORDER — METOPROLOL TARTRATE 50 MG PO TABS
50.0000 mg | ORAL_TABLET | Freq: Once | ORAL | Status: AC
  Administered 2022-03-10: 50 mg via ORAL
  Filled 2022-03-10: qty 1

## 2022-03-10 MED ORDER — TECHNETIUM TC 99M TETROFOSMIN IV KIT
9.8400 | PACK | Freq: Once | INTRAVENOUS | Status: AC | PRN
  Administered 2022-03-10: 9.84 via INTRAVENOUS

## 2022-03-10 MED ORDER — REGADENOSON 0.4 MG/5ML IV SOLN
0.4000 mg | Freq: Once | INTRAVENOUS | Status: AC
  Administered 2022-03-10: 0.4 mg via INTRAVENOUS

## 2022-03-10 MED ORDER — METOPROLOL TARTRATE 50 MG PO TABS
50.0000 mg | ORAL_TABLET | Freq: Three times a day (TID) | ORAL | Status: DC
  Administered 2022-03-11: 50 mg via ORAL
  Filled 2022-03-10: qty 1

## 2022-03-10 MED ORDER — DILTIAZEM HCL ER COATED BEADS 120 MG PO CP24
240.0000 mg | ORAL_CAPSULE | Freq: Every day | ORAL | Status: DC
  Administered 2022-03-10 – 2022-03-12 (×3): 240 mg via ORAL
  Filled 2022-03-10 (×3): qty 2

## 2022-03-10 MED ORDER — TECHNETIUM TC 99M TETROFOSMIN IV KIT
30.0400 | PACK | Freq: Once | INTRAVENOUS | Status: AC | PRN
  Administered 2022-03-10: 30.04 via INTRAVENOUS

## 2022-03-10 MED ORDER — ORAL CARE MOUTH RINSE: 15.0000 mL | OROMUCOSAL | Status: DC | PRN

## 2022-03-10 NOTE — Telephone Encounter (Signed)
Pharmacy Patient Advocate Encounter  Insurance verification completed.    The patient is insured through [employer] Medicare Part D   The patient is currently admitted and ran test claims for the following: Eliquis.  Copays and coinsurance results were relayed to Inpatient clinical team.  

## 2022-03-10 NOTE — Progress Notes (Signed)
[facility] Encounter Note  Patient: Victoria Dalton / Admit Date: 03/08/2022 / Date of Encounter: 03/10/2022, 8:54 AM   Subjective: Overall patient feels much better since admission to the hospital with better heart rate control of diltiazem and furosemide injection for possible pulmonary edema and acute diastolic dysfunction congestive heart failure due to tachycardia.  Echocardiogram shows normal LV systolic function and no evidence of significant valvular heart disease.  The patient has had reasonable control of diltiazem drip but will need conversion to oral diltiazem.  She is tolerating anticoagulation well.  No evidence of myocardial infarction although scheduled for a stress test today to evaluate other causes of atrial fibrillation.  Review of Systems: Positive for: Shortness of breath Negative for: Vision change, hearing change, syncope, dizziness, nausea, vomiting,diarrhea, bloody stool, stomach pain, cough, congestion, diaphoresis, urinary frequency, urinary pain,skin lesions, skin rashes Others previously listed  Objective: Telemetry: Atrial fibrillation rapid ventricular rate Physical Exam: Blood pressure 120/82, pulse 96, temperature 98 F (36.7 C), resp. rate 20, weight 119.4 kg, SpO2 95 %. Body mass index is 38.87 kg/m. General: Well developed, well nourished, in no acute distress. Head: Normocephalic, atraumatic, sclera non-icteric, no xanthomas, nares are without discharge. Neck: No apparent masses Lungs: Normal respirations with no wheezes, no rhonchi, no rales , no crackles   Heart: Regular rate and rhythm, normal S1 S2, no murmur, no rub, no gallop, PMI is normal size and placement, carotid upstroke normal without bruit, jugular venous pressure normal Abdomen: Soft, non-tender, non-distended with normoactive bowel sounds. No hepatosplenomegaly. Abdominal aorta is normal size without bruit Extremities: Trace edema, no clubbing, no cyanosis, no  ulcers,  Peripheral: 2+ radial, 2+ femoral, 2+ dorsal pedal pulses Neuro: Alert and oriented. Moves all extremities spontaneously. Psych:  Responds to questions appropriately with a normal affect.   Intake/Output Summary (Last 24 hours) at 03/10/2022 0854 Last data filed at 03/10/2022 0515 Gross per 24 hour  Intake 281.61 ml  Output 400 ml  Net -118.39 ml    Inpatient Medications:   apixaban  5 mg Oral BID   aspirin EC  81 mg Oral Daily   diltiazem  240 mg Oral Daily   docusate sodium  100 mg Oral BID   furosemide  20 mg Intravenous Q12H   insulin aspart  0-15 Units Subcutaneous TID WC   metoprolol tartrate  25 mg Oral 3 x daily with food   pantoprazole  40 mg Oral Daily   sodium chloride flush  3 mL Intravenous Q12H   Infusions:   Labs: Recent Labs    03/09/22 0515 03/10/22 0514  NA 139 136  K 4.1 4.1  CL 101 98  CO2 27 27  GLUCOSE 162* 184*  BUN 20 23  CREATININE 1.14* 1.25*  CALCIUM 9.2 8.8*  MG 2.1 2.1   Recent Labs    03/08/22 1124  AST 32  ALT 36  ALKPHOS 113  BILITOT 1.1  PROT 6.7  ALBUMIN 3.8   Recent Labs    03/08/22 1124 03/09/22 0515 03/10/22 0514  WBC 8.9 11.4* 9.9  NEUTROABS 7.0  --   --   HGB 12.1 13.0 12.4  HCT 37.2 40.3 37.6  MCV 94.2 94.6 92.2  PLT 192 212 213   No results for input(s): "CKTOTAL", "CKMB", "TROPONINI" in the last 72 hours. Invalid input(s): "POCBNP" No results for input(s): "HGBA1C" in the last 72 hours.   Weights: Filed Weights   03/08/22 1106 03/10/22 0554  Weight: 122.9 kg 119.4 kg  Radiology/Studies:  ECHOCARDIOGRAM COMPLETE  Result Date: 03/09/2022    ECHOCARDIOGRAM REPORT   Patient Name:   Victoria Dalton Date of Exam: 03/09/2022 Medical Rec #:  [id number]         Height:       69.0 in Accession #:    [id number]        Weight:       271.0 lb Date of Birth:  1930-07-14         BSA:          2.351 m Patient Age:    88 years          BP:           124/95 mmHg Patient Gender: F                 HR:            112 bpm. Exam Location:  [facility] Procedure: 2D Echo Indications:     CHF I50.9                  Atrial Fibrillation I48.91  History:         Patient has no prior history of Echocardiogram examinations.  Sonographer:     Kathlen Brunswick RDCS Referring Phys:  [id number] Jose Persia Diagnosing Phys: Yolonda Kida MD IMPRESSIONS  1. Left ventricular ejection fraction, by estimation, is 50 to 55%. The left ventricle has low normal function. The left ventricle has no regional wall motion abnormalities. There is moderate concentric left ventricular hypertrophy. Left ventricular diastolic parameters are consistent with Grade I diastolic dysfunction (impaired relaxation).  2. Right ventricular systolic function is normal. The right ventricular size is normal.  3. The mitral valve is normal in structure. Moderate mitral valve regurgitation.  4. The aortic valve is normal in structure. Aortic valve regurgitation is not visualized. FINDINGS  Left Ventricle: Left ventricular ejection fraction, by estimation, is 50 to 55%. The left ventricle has low normal function. The left ventricle has no regional wall motion abnormalities. The left ventricular internal cavity size was normal in size. There is moderate concentric left ventricular hypertrophy. Left ventricular diastolic parameters are consistent with Grade I diastolic dysfunction (impaired relaxation). Right Ventricle: The right ventricular size is normal. No increase in right ventricular wall thickness. Right ventricular systolic function is normal. Left Atrium: Left atrial size was normal in size. Right Atrium: Right atrial size was normal in size. Pericardium: There is no evidence of pericardial effusion. Mitral Valve: The mitral valve is normal in structure. Moderate mitral valve regurgitation. Tricuspid Valve: The tricuspid valve is normal in structure. Tricuspid valve regurgitation is mild. Aortic Valve: The aortic valve is normal in structure. Aortic valve  regurgitation is not visualized. Aortic valve peak gradient measures 13.6 mmHg. Pulmonic Valve: The pulmonic valve was normal in structure. Pulmonic valve regurgitation is not visualized. Aorta: The ascending aorta was not well visualized. IAS/Shunts: No atrial level shunt detected by color flow Doppler.  LEFT VENTRICLE PLAX 2D LVIDd:         4.30 cm     Diastology LVIDs:         3.30 cm     LV e' medial:    8.59 cm/s LV PW:         1.50 cm     LV E/e' medial:  14.6 LV IVS:        1.30 cm     LV e' lateral:   10.40 cm/s LVOT diam:  2.00 cm     LV E/e' lateral: 12.0 LV SV:         47 LV SV Index:   20 LVOT Area:     3.14 cm  LV Volumes (MOD) LV vol d, MOD A2C: 42.4 ml LV vol d, MOD A4C: 40.2 ml LV vol s, MOD A2C: 19.6 ml LV vol s, MOD A4C: 19.3 ml LV SV MOD A2C:     22.8 ml LV SV MOD A4C:     40.2 ml LV SV MOD BP:      21.7 ml RIGHT VENTRICLE RV Basal diam:  2.10 cm RV S prime:     9.46 cm/s TAPSE (M-mode): 1.5 cm LEFT ATRIUM             Index        RIGHT ATRIUM           Index LA diam:        3.50 cm 1.49 cm/m   RA Area:     13.40 cm LA Vol (A2C):   48.6 ml 20.67 ml/m  RA Volume:   29.80 ml  12.68 ml/m LA Vol (A4C):   35.0 ml 14.89 ml/m LA Biplane Vol: 42.1 ml 17.91 ml/m  AORTIC VALVE                 PULMONIC VALVE AV Area (Vmax): 1.64 cm     PV Vmax:       0.85 m/s AV Vmax:        184.50 cm/s  PV Peak grad:  2.9 mmHg AV Peak Grad:   13.6 mmHg LVOT Vmax:      96.44 cm/s LVOT Vmean:     63.520 cm/s LVOT VTI:       0.150 m  AORTA Ao Root diam: 3.10 cm Ao Asc diam:  3.30 cm MITRAL VALVE                  TRICUSPID VALVE MV Area (PHT): 3.50 cm       TV Peak grad:   30.1 mmHg MV Decel Time: 217 msec       TV Vmax:        2.74 m/s MR Peak grad:    116.6 mmHg MR Mean grad:    84.0 mmHg    SHUNTS MR Vmax:         540.00 cm/s  Systemic VTI:  0.15 m MR Vmean:        443.0 cm/s   Systemic Diam: 2.00 cm MR PISA:         1.57 cm MR PISA Eff ROA: 9 mm MR PISA Radius:  0.50 cm MV E velocity: 125.00 cm/s Yolonda Kida MD Electronically signed by Yolonda Kida MD Signature Date/Time: 03/09/2022/2:58:42 PM    Final    DG Chest 2 View  Result Date: 03/08/2022 CLINICAL DATA:  Chest pain and shortness of breath. EXAM: CHEST - 2 VIEW COMPARISON:  Earlier sameDay. FINDINGS: Low lung volumes. Bibasilar collapse/consolidation is associated with small bilateral pleural effusions. Interstitial markings are diffusely coarsened with chronic features. The visualized bony structures of the thorax are unremarkable. Telemetry leads overlie the chest. IMPRESSION: Low volume film with bibasilar collapse/consolidation and small bilateral pleural effusions. Electronically Signed   By: Misty Stanley M.D.   On: 03/08/2022 11:59   DG Chest 2 View  Result Date: 03/08/2022 CLINICAL DATA:  Shortness of breath EXAM: CHEST - 2 VIEW COMPARISON:  10/14/2015 FINDINGS: Heart size is  mildly enlarged. Aortic atherosclerosis. Small right pleural effusion. Streaky bibasilar opacities, right greater than left. No pneumothorax. IMPRESSION: 1. Small right pleural effusion. 2. Streaky bibasilar opacities, right greater than left, may reflect atelectasis and/or pneumonia. Electronically Signed   By: Davina Poke D.O.   On: 03/08/2022 10:14   OCT, Retina - OU - Both Eyes  Result Date: 02/10/2022 Right Eye Quality was good. Central Foveal Thickness: 239. Progression has been stable. Findings include normal foveal contour, no IRF, no SRF, retinal drusen , subretinal hyper-reflective material, outer retinal atrophy (persistent focal central SRHM -- vitelliform like lesion; no central SRF, patchy ORA centrally). Left Eye Quality was good. Central Foveal Thickness: 245. Progression has been stable. Findings include normal foveal contour, no IRF, no SRF, subretinal hyper-reflective material, outer retinal atrophy (focal central SRHM; no SRF, patchy ORA centrally). Notes *Images captured and stored on drive Diagnosis / Impression: NFP, no IRF, no  SRF OU OD: persistent focal central SRHM -- vitelliform like lesion; patchy ORA OS: focal central SRHM; patchy ORA Clinical management: See below Abbreviations: NFP - Normal foveal profile. CME - cystoid macular edema. PED - pigment epithelial detachment. IRF - intraretinal fluid. SRF - subretinal fluid. EZ - ellipsoid zone. ERM - epiretinal membrane. ORA - outer retinal atrophy. ORT - outer retinal tubulation. SRHM - subretinal hyper-reflective material. IRHM - intraretinal hyper-reflective material     Assessment and Recommendation  88 y.o. female with acute onset of atrial fibrillation rapid ventricular rate likely causing diastolic dysfunction congestive heart failure and pulmonary edema now improved with appropriate medication management including diltiazem drip and low-dose metoprolol.  There is no current evidence of acute myocardial infarction 1.  Add diltiazem 240 mg to metoprolol orally to improve heart rate control of atrial fibrillation with combination the patient will have heart rate control below 100 bpm 2.  Stress test today to assess for myocardial ischemia as a cause of above 3.  Anticoagulation for further risk reduction in stroke with atrial fibrillation with Eliquis at 5 mg twice per day 4.  Continue intravenous Lasix today for pulmonary edema and diastolic dysfunction congestive heart failure which should resolve with heart rate control after above 5.  If ambulating well and no evidence of significant ischemia by stress test and heart rate controlled with combination of diltiazem metoprolol would be okay for discharged home today with further follow-up and medication management thereafter  Signed, Serafina Royals M.D. FACC

## 2022-03-10 NOTE — Progress Notes (Signed)
   Heart Failure Nurse Navigator Note   Was made aware of this patient being a heart failure patient by the patient's nurse, [place].  Attempted to meet with patient and family patient was currently off the floor for stress testing.   Patient's family was giving the living with heart failure teaching booklet, zone magnet, follow on heart failure and low-sodium along with a weight chart.  Briefly discussed with patient's daughter [name] who was at the bedside taking care of heart failure.  Entails daily weights, fluid restriction, low-sodium and medications.  Her questions at this time were answered.  She had started to read the education booklet, I told her that I would gladly come back tomorrow when her mother was at the bedside and we can go over everything again along with answering any questions that she might come across while reading the information she has been given.  She voices understanding.  Pricilla Riffle RN CHFN

## 2022-03-10 NOTE — TOC Initial Note (Signed)
Transition of Care [facility]) - Initial/Assessment Note    Patient Details  Name: Victoria Dalton MRN: [id number] Date of Birth: 05/15/1931  Transition of Care [facility]) CM/SW Contact:    Candie Chroman, LCSW Phone Number: 03/10/2022, 2:44 PM  Clinical Narrative:    Patient off unit for stress test. Daughter at bedside. CSW introduced role and explained that discharge planning would be discussed. Daughter is unsure if patient will agree to SNF or not. Gave CMS scores for facilities within 25 miles of patient's zip code. Will follow up with preferences.               Expected Discharge Plan:  (TBD) Barriers to Discharge: Continued Medical Work up   Patient Goals and CMS Choice   CMS Medicare.gov Compare Post Acute Care list provided to:: Patient Represenative (must comment) (Daughter)    Expected Discharge Plan and Services Expected Discharge Plan:  (TBD)     Post Acute Care Choice:  (TBD) Living arrangements for the past 2 months: Single Family Home                                      Prior Living Arrangements/Services Living arrangements for the past 2 months: Single Family Home Lives with:: Self Patient language and need for interpreter reviewed:: Yes        Need for Family Participation in Patient Care: Yes (Comment)     Criminal Activity/Legal Involvement Pertinent to Current Situation/Hospitalization: No - Comment as needed  Activities of Daily Living      Permission Sought/Granted      Share Information with NAME: [name]     Permission granted to share info w Relationship: Daughter  Permission granted to share info w Contact Information: [phone]  Emotional Assessment       Orientation: : Oriented to Self, Oriented to Place, Oriented to  Time, Oriented to Situation Alcohol / Substance Use: Not Applicable Psych Involvement: No (comment)  Admission diagnosis:  Acute respiratory failure with hypoxia ([place]) [J96.01] Atrial fibrillation  with RVR (HCC) [I48.91] Patient Active Problem List   Diagnosis Date Noted   Atrial fibrillation with RVR ([place]) 03/08/2022   Chronic kidney disease (CKD), stage III (moderate) ([place]) 03/08/2022   Acute heart failure ([place]) 03/08/2022   Acute respiratory failure with hypoxia ([place])    Lymph node enlargement 05/01/2021   Need for shingles vaccine 05/01/2021   Gastroenteritis 10/12/2020   Candidiasis of breast 05/31/2020   Need for influenza vaccination 03/29/2020   Class 3 severe obesity due to excess calories in adult ([place]) 11/16/2019   Vaginitis 10/25/2019   Blood in stool    Gallbladder mass    Cholangitis    Cholecystitis    Right upper quadrant pain    Hyperbilirubinemia    Protein-calorie malnutrition, severe 01/11/2016   Cholecystitis with cholelithiasis 01/10/2016   Benign essential HTN 12/06/2015   Dyslipidemia 05/23/2015   Gastro-esophageal reflux disease without esophagitis 05/08/2014   Controlled type 2 diabetes mellitus without complication ([place]) 86/76/1950   Type 2 diabetes mellitus without complication ([place]) 93/26/7124   PCP:  Cletis [place], MD Pharmacy:   [facility] [place], [place] [place] AT [facility] [address] [place] [address] Phone: [phone] Fax: [phone]     Social Determinants of Health (SDOH) Interventions    Readmission Risk Interventions  No data to display

## 2022-03-10 NOTE — Progress Notes (Addendum)
Progress Note    Victoria Dalton  UJW:[id number] DOB: [birth year]  DOA: 03/08/2022 PCP: Corky Downs, MD      Brief Narrative:    Medical records reviewed and are as summarized below:  Victoria Dalton is a 88 y.o. female with medical history significant for hypertension, hyperlipidemia, diabetes mellitus, history of UTIs, macular degeneration, kidney stones, hypertensive retinopathy, who presented to the hospital with shortness of breath.  This started about 2 weeks ago and has progressively gotten worse. She also noticed increasing lower extremity swelling in the preceding week prior to admission.   She was found to have atrial fibrillation with RVR, acute congestive heart failure complicated by acute hypoxic respiratory failure.    Assessment/Plan:   Principal Problem:   Atrial fibrillation with RVR (HCC) Active Problems:   Acute on chronic diastolic CHF (congestive heart failure) (HCC)   Acute respiratory failure with hypoxia (HCC)   Chronic kidney disease (CKD), stage III (moderate) (HCC)   Benign essential HTN   Controlled type 2 diabetes mellitus without complication (HCC)   Dyslipidemia    Body mass index is 38.87 kg/m.  (Morbid obesity)  Acute on chronic diastolic CHF: Continue IV Lasix.  Monitor BMP, daily weight and urine output.  Nuclear stress test report is pending. 2D echo showed EF estimated at 50 to 55%, moderate concentric LVH, grade 1 diastolic dysfunction, moderate mitral regurgitation.    Atrial fibrillation with RVR: She was on IV Cardizem drip this morning but this was discontinued later in the day.  She has been started on oral Cardizem.  Continue metoprolol and Eliquis.  CHA2DS2-VASc score is 5.  Follow-up with cardiologist.  Acute hypoxic respiratory failure: Improved  CKD stage IIIa: Creatinine is trending up but is still within stage III CKD.  Urinalysis is not impressive.  Doubt UTI at this time.  Other comorbidities include  type II DM (hemoglobin A1c was 8.1% a week prior to admission), dyslipidemia, hypertension   Diet Order             Diet heart healthy/carb modified Room service appropriate? Yes; Fluid consistency: Thin  Diet effective now                            Consultants: Cardiologist  Procedures: None    Medications:    apixaban  5 mg Oral BID   diltiazem  240 mg Oral Daily   docusate sodium  100 mg Oral BID   furosemide  20 mg Intravenous Q12H   insulin aspart  0-15 Units Subcutaneous TID WC   metoprolol tartrate  25 mg Oral 3 x daily with food   pantoprazole  40 mg Oral Daily   sodium chloride flush  3 mL Intravenous Q12H   Continuous Infusions:     Anti-infectives (From admission, onward)    None       Status is: Inpatient Remains inpatient appropriate because: IV Lasix, PT is recommending discharge to SNF        Family Communication/Anticipated D/C date and plan/Code Status   DVT prophylaxis:  apixaban (ELIQUIS) tablet 5 mg     Code Status: DNR  Family Communication: Plan discussed with Cordelia Pen, daughter, at the bedside  Disposition Plan: Plan to discharge home tomorrow          Subjective:   Interval events noted.  She feels better today.  No shortness of breath or chest pain.  She still has some  leg swelling. Cordelia Pen, daughter, was at the bedside  Objective:    Vitals:   03/10/22 0350 03/10/22 0554 03/10/22 0859 03/10/22 1608  BP: 120/82  (!) 126/95 113/79  Pulse: 96  86 (!) 108  Resp: 20  18 (!) 22  Temp: 98 F (36.7 C)  97.9 F (36.6 C)   TempSrc:   Oral   SpO2: 95%  92% 94%  Weight:  119.4 kg     No data found.   Intake/Output Summary (Last 24 hours) at 03/10/2022 1700 Last data filed at 03/10/2022 1223 Gross per 24 hour  Intake 281.61 ml  Output 800 ml  Net -518.39 ml   Filed Weights   03/08/22 1106 03/10/22 0554  Weight: 122.9 kg 119.4 kg    Exam:  GEN: NAD SKIN: Warm and dry EYES: No pallor or  icterus ENT: MMM CV: Irregular rate and rhythm, tachycardic PULM: CTA B ABD: soft, obese, NT, +BS CNS: AAO x 3, non focal EXT: Mild bilateral leg edema, no tenderness        Data Reviewed:   I have personally reviewed following labs and imaging studies:  Labs: Labs show the following:   Basic Metabolic Panel: Recent Labs  Lab 03/08/22 1124 03/09/22 0515 03/10/22 0514  NA 138 139 136  K 4.4 4.1 4.1  CL 107 101 98  CO2 23 27 27   GLUCOSE 212* 162* 184*  BUN 23 20 23   CREATININE 1.26* 1.14* 1.25*  CALCIUM 9.0 9.2 8.8*  MG  --  2.1 2.1   GFR Estimated Creatinine Clearance: 42.1 mL/min (A) (by C-G formula based on SCr of 1.25 mg/dL (H)). Liver Function Tests: Recent Labs  Lab 03/08/22 1124  AST 32  ALT 36  ALKPHOS 113  BILITOT 1.1  PROT 6.7  ALBUMIN 3.8   No results for input(s): "LIPASE", "AMYLASE" in the last 168 hours. No results for input(s): "AMMONIA" in the last 168 hours. Coagulation profile No results for input(s): "INR", "PROTIME" in the last 168 hours.  CBC: Recent Labs  Lab 03/08/22 1124 03/09/22 0515 03/10/22 0514  WBC 8.9 11.4* 9.9  NEUTROABS 7.0  --   --   HGB 12.1 13.0 12.4  HCT 37.2 40.3 37.6  MCV 94.2 94.6 92.2  PLT 192 212 213   Cardiac Enzymes: No results for input(s): "CKTOTAL", "CKMB", "CKMBINDEX", "TROPONINI" in the last 168 hours. BNP (last 3 results) No results for input(s): "PROBNP" in the last 8760 hours. CBG: Recent Labs  Lab 03/09/22 1153 03/09/22 1657 03/09/22 2115 03/10/22 0903 03/10/22 1609  GLUCAP 275* 187* 166* 170* 212*   D-Dimer: No results for input(s): "DDIMER" in the last 72 hours. Hgb A1c: No results for input(s): "HGBA1C" in the last 72 hours. Lipid Profile: No results for input(s): "CHOL", "HDL", "LDLCALC", "TRIG", "CHOLHDL", "LDLDIRECT" in the last 72 hours. Thyroid function studies: Recent Labs    03/08/22 1124  TSH 1.993   Anemia work up: No results for input(s): "VITAMINB12", "FOLATE",  "FERRITIN", "TIBC", "IRON", "RETICCTPCT" in the last 72 hours. Sepsis Labs: Recent Labs  Lab 03/08/22 1124 03/09/22 0515 03/10/22 0514  WBC 8.9 11.4* 9.9    Microbiology Recent Results (from the past 240 hour(s))  Resp Panel by RT-PCR (Flu A&B, Covid) Anterior Nasal Swab     Status: None   Collection Time: 03/08/22 12:02 PM   Specimen: Anterior Nasal Swab  Result Value Ref Range Status   SARS Coronavirus 2 by RT PCR NEGATIVE NEGATIVE Final    Comment: (NOTE)  SARS-CoV-2 target nucleic acids are NOT DETECTED.  The SARS-CoV-2 RNA is generally detectable in upper respiratory specimens during the acute phase of infection. The lowest concentration of SARS-CoV-2 viral copies this assay can detect is 138 copies/mL. A negative result does not preclude SARS-Cov-2 infection and should not be used as the sole basis for treatment or other patient management decisions. A negative result may occur with  improper specimen collection/handling, submission of specimen other than nasopharyngeal swab, presence of viral mutation(s) within the areas targeted by this assay, and inadequate number of viral copies(<138 copies/mL). A negative result must be combined with clinical observations, patient history, and epidemiological information. The expected result is Negative.  Fact Sheet for Patients:  BloggerCourse.com  Fact Sheet for Healthcare Providers:  SeriousBroker.it  This test is no t yet approved or cleared by the [place] FDA and  has been authorized for detection and/or diagnosis of SARS-CoV-2 by FDA under an Emergency Use Authorization (EUA). This EUA will remain  in effect (meaning this test can be used) for the duration of the COVID-19 declaration under Section 564(b)(1) of the Act, 21 U.S.C.section 360bbb-3(b)(1), unless the authorization is terminated  or revoked sooner.       Influenza A by PCR NEGATIVE NEGATIVE Final    Influenza B by PCR NEGATIVE NEGATIVE Final    Comment: (NOTE) The Xpert Xpress SARS-CoV-2/FLU/RSV plus assay is intended as an aid in the diagnosis of influenza from Nasopharyngeal swab specimens and should not be used as a sole basis for treatment. Nasal washings and aspirates are unacceptable for Xpert Xpress SARS-CoV-2/FLU/RSV testing.  Fact Sheet for Patients: BloggerCourse.com  Fact Sheet for Healthcare Providers: SeriousBroker.it  This test is not yet approved or cleared by the [place] FDA and has been authorized for detection and/or diagnosis of SARS-CoV-2 by FDA under an Emergency Use Authorization (EUA). This EUA will remain in effect (meaning this test can be used) for the duration of the COVID-19 declaration under Section 564(b)(1) of the Act, 21 U.S.C. section 360bbb-3(b)(1), unless the authorization is terminated or revoked.  Performed at [facility], [address] [place], [place] [address]     Procedures and diagnostic studies:  ECHOCARDIOGRAM COMPLETE  Result Date: 03/09/2022    ECHOCARDIOGRAM REPORT   Patient Name:   STORMEE MILLIREN Date of Exam: 03/09/2022 Medical Rec #:  [id number]         Height:       69.0 in Accession #:    [id number]        Weight:       271.0 lb Date of Birth:  June 16, 1930         BSA:          2.351 m Patient Age:    89 years          BP:           124/95 mmHg Patient Gender: F                 HR:           112 bpm. Exam Location:  [facility] Procedure: 2D Echo Indications:     CHF I50.9                  Atrial Fibrillation I48.91  History:         Patient has no prior history of Echocardiogram examinations.  Sonographer:     Overton Mam RDCS Referring Phys:  [id number] Verdene Lennert Diagnosing Phys: Gerda Diss  Salome Arnt MD IMPRESSIONS  1. Left ventricular ejection fraction, by estimation, is 50 to 55%. The left ventricle has low normal function. The left ventricle has no regional  wall motion abnormalities. There is moderate concentric left ventricular hypertrophy. Left ventricular diastolic parameters are consistent with Grade I diastolic dysfunction (impaired relaxation).  2. Right ventricular systolic function is normal. The right ventricular size is normal.  3. The mitral valve is normal in structure. Moderate mitral valve regurgitation.  4. The aortic valve is normal in structure. Aortic valve regurgitation is not visualized. FINDINGS  Left Ventricle: Left ventricular ejection fraction, by estimation, is 50 to 55%. The left ventricle has low normal function. The left ventricle has no regional wall motion abnormalities. The left ventricular internal cavity size was normal in size. There is moderate concentric left ventricular hypertrophy. Left ventricular diastolic parameters are consistent with Grade I diastolic dysfunction (impaired relaxation). Right Ventricle: The right ventricular size is normal. No increase in right ventricular wall thickness. Right ventricular systolic function is normal. Left Atrium: Left atrial size was normal in size. Right Atrium: Right atrial size was normal in size. Pericardium: There is no evidence of pericardial effusion. Mitral Valve: The mitral valve is normal in structure. Moderate mitral valve regurgitation. Tricuspid Valve: The tricuspid valve is normal in structure. Tricuspid valve regurgitation is mild. Aortic Valve: The aortic valve is normal in structure. Aortic valve regurgitation is not visualized. Aortic valve peak gradient measures 13.6 mmHg. Pulmonic Valve: The pulmonic valve was normal in structure. Pulmonic valve regurgitation is not visualized. Aorta: The ascending aorta was not well visualized. IAS/Shunts: No atrial level shunt detected by color flow Doppler.  LEFT VENTRICLE PLAX 2D LVIDd:         4.30 cm     Diastology LVIDs:         3.30 cm     LV e' medial:    8.59 cm/s LV PW:         1.50 cm     LV E/e' medial:  14.6 LV IVS:         1.30 cm     LV e' lateral:   10.40 cm/s LVOT diam:     2.00 cm     LV E/e' lateral: 12.0 LV SV:         47 LV SV Index:   20 LVOT Area:     3.14 cm  LV Volumes (MOD) LV vol d, MOD A2C: 42.4 ml LV vol d, MOD A4C: 40.2 ml LV vol s, MOD A2C: 19.6 ml LV vol s, MOD A4C: 19.3 ml LV SV MOD A2C:     22.8 ml LV SV MOD A4C:     40.2 ml LV SV MOD BP:      21.7 ml RIGHT VENTRICLE RV Basal diam:  2.10 cm RV S prime:     9.46 cm/s TAPSE (M-mode): 1.5 cm LEFT ATRIUM             Index        RIGHT ATRIUM           Index LA diam:        3.50 cm 1.49 cm/m   RA Area:     13.40 cm LA Vol (A2C):   48.6 ml 20.67 ml/m  RA Volume:   29.80 ml  12.68 ml/m LA Vol (A4C):   35.0 ml 14.89 ml/m LA Biplane Vol: 42.1 ml 17.91 ml/m  AORTIC VALVE  PULMONIC VALVE AV Area (Vmax): 1.64 cm     PV Vmax:       0.85 m/s AV Vmax:        184.50 cm/s  PV Peak grad:  2.9 mmHg AV Peak Grad:   13.6 mmHg LVOT Vmax:      96.44 cm/s LVOT Vmean:     63.520 cm/s LVOT VTI:       0.150 m  AORTA Ao Root diam: 3.10 cm Ao Asc diam:  3.30 cm MITRAL VALVE                  TRICUSPID VALVE MV Area (PHT): 3.50 cm       TV Peak grad:   30.1 mmHg MV Decel Time: 217 msec       TV Vmax:        2.74 m/s MR Peak grad:    116.6 mmHg MR Mean grad:    84.0 mmHg    SHUNTS MR Vmax:         540.00 cm/s  Systemic VTI:  0.15 m MR Vmean:        443.0 cm/s   Systemic Diam: 2.00 cm MR PISA:         1.57 cm MR PISA Eff ROA: 9 mm MR PISA Radius:  0.50 cm MV E velocity: 125.00 cm/s Dwayne Salome Arnt MD Electronically signed by Alwyn Pea MD Signature Date/Time: 03/09/2022/2:58:42 PM    Final                LOS: 1 day   Haleemah Buckalew  [facility]   Pager on [email or web address]. If 7PM-7AM, please contact night-coverage at [email or web address]     03/10/2022, 5:00 PM

## 2022-03-10 NOTE — TOC Benefit Eligibility Note (Signed)
Patient Advocate Encounter  Insurance verification completed.    The patient is currently admitted and upon discharge could be taking Eliquis 5 mg.  The current 30 day co-pay is $47.00.   The patient is insured through [employer] Medicare Part D     Tranisha Tissue, CPhT Pharmacy Patient Advocate Specialist [facility] Patient Advocate Team Direct Number: [phone]  Fax: [phone]        

## 2022-03-11 LAB — BASIC METABOLIC PANEL
Anion gap: 10 (ref 5–15)
BUN: 25 mg/dL — ABNORMAL HIGH (ref 8–23)
CO2: 28 mmol/L (ref 22–32)
Calcium: 8.9 mg/dL (ref 8.9–10.3)
Chloride: 98 mmol/L (ref 98–111)
Creatinine, Ser: 1.18 mg/dL — ABNORMAL HIGH (ref 0.44–1.00)
GFR, Estimated: 44 mL/min — ABNORMAL LOW (ref >60)
Glucose, Bld: 163 mg/dL — ABNORMAL HIGH (ref 70–99)
Potassium: 4 mmol/L (ref 3.5–5.1)
Sodium: 136 mmol/L (ref 135–145)

## 2022-03-11 LAB — NM MYOCAR MULTI W/SPECT W/WALL MOTION / EF
Base ST Depression (mm): 0 mm
Estimated workload: 1
Exercise duration (min): 1 min
Exercise duration (sec): 1 s
LV dias vol: 61 mL (ref 46–106)
LV sys vol: 46 mL
MPHR: 131 {beats}/min
Nuc Stress EF: 25 %
Peak HR: 113 {beats}/min
Percent HR: 87 %
Rest HR: 97 {beats}/min
Rest Nuclear Isotope Dose: 9.8 mCi
SDS: 0
SRS: 4
SSS: 0
ST Depression (mm): 0 mm
Stress Nuclear Isotope Dose: 30 mCi
TID: 0.98

## 2022-03-11 LAB — MAGNESIUM: Magnesium: 2.1 mg/dL (ref 1.7–2.4)

## 2022-03-11 LAB — GLUCOSE, CAPILLARY
Glucose-Capillary: 181 mg/dL — ABNORMAL HIGH (ref 70–99)
Glucose-Capillary: 206 mg/dL — ABNORMAL HIGH (ref 70–99)
Glucose-Capillary: 166 mg/dL — ABNORMAL HIGH (ref 70–99)
Glucose-Capillary: 217 mg/dL — ABNORMAL HIGH (ref 70–99)

## 2022-03-11 MED ORDER — GUAIFENESIN-DM 100-10 MG/5ML PO SYRP: 15.0000 mL | ORAL_SOLUTION | ORAL | Status: DC | PRN

## 2022-03-11 MED ORDER — METOPROLOL TARTRATE 50 MG PO TABS
100.0000 mg | ORAL_TABLET | Freq: Two times a day (BID) | ORAL | Status: DC
  Administered 2022-03-11 – 2022-03-12 (×2): 100 mg via ORAL
  Filled 2022-03-11 (×2): qty 2

## 2022-03-11 MED ORDER — METOPROLOL TARTRATE 50 MG PO TABS
50.0000 mg | ORAL_TABLET | Freq: Once | ORAL | Status: AC
  Administered 2022-03-11: 50 mg via ORAL
  Filled 2022-03-11: qty 1

## 2022-03-11 MED ORDER — MENTHOL 3 MG MT LOZG
1.0000 | LOZENGE | OROMUCOSAL | Status: DC | PRN
  Administered 2022-03-11: 3 mg via ORAL
  Filled 2022-03-11: qty 9

## 2022-03-11 NOTE — Progress Notes (Signed)
Physical Therapy Treatment Patient Details Name: Victoria Dalton MRN: [id number] DOB: June 04, 1930 Today's Date: 03/11/2022   History of Present Illness Victoria Dalton is a 88 y.o. female with medical history significant for hypertension, hyperlipidemia, diabetes mellitus, history of UTIs, macular degeneration, kidney stones, hypertensive retinopathy, who presented to the hospital with shortness of breath.  This started about 2 weeks ago and has progressively gotten worse.  She also noticed increasing lower extremity swelling in the preceding week prior to admission. She was found to have atrial fibrillation with RVR, acute congestive heart failure complicated by acute hypoxic respiratory failure.    PT Comments    Pt seen this am for skilled PT services. Education provided for pacing and relaxation techniques due to increased HR upon exertion and when engaged in lengthy conversations. At rest, pt's HR was between 68-74bpm (after encouraged to sit quietly for 2 minutes). Upon ambulating a short distance, HR increased to 130-140bpm and continued for several minutes after seated to rest. Continue to recommend short term SNF placement once medically stable in order to regain strength and endurance to return to independent PLOF.   Recommendations for follow up therapy are one component of a multi-disciplinary discharge planning process, led by the attending physician.  Recommendations may be updated based on patient status, additional functional criteria and insurance authorization.  Follow Up Recommendations  Skilled nursing-short term rehab (<3 hours/day) Can patient physically be transported by private vehicle: Yes   Assistance Recommended at Discharge Frequent or constant Supervision/Assistance  Patient can return home with the following A little help with walking and/or transfers;A little help with bathing/dressing/bathroom;Assistance with cooking/housework;Help with stairs or ramp for  entrance;Assist for transportation   Equipment Recommendations  Other (comment) (TBD at next facility)    Recommendations for Other Services       Precautions / Restrictions Precautions Precautions: Fall Precaution Comments: tachycardic Restrictions Weight Bearing Restrictions: No     Mobility  Bed Mobility               General bed mobility comments: n/t pt up in recliner    Transfers Overall transfer level: Needs assistance Equipment used: Rolling walker (2 wheels) Transfers: Sit to/from Stand Sit to Stand: Min guard                Ambulation/Gait Ambulation/Gait assistance: Min guard Gait Distance (Feet): 50 Feet Assistive device: Rolling walker (2 wheels) Gait Pattern/deviations: Wide base of support, Decreased stride length Gait velocity: decreased         Stairs Stairs:  (Pt has ramp enterance)           Wheelchair Mobility    Modified Rankin (Stroke Patients Only)       Balance Overall balance assessment: Needs assistance Sitting-balance support: No upper extremity supported, Feet supported Sitting balance-Leahy Scale: Good Sitting balance - Comments: steady sitting at ege of bed   Standing balance support: Bilateral upper extremity supported, During functional activity, Reliant on assistive device for balance Standing balance-Leahy Scale: Fair Standing balance comment: Patient requires B UE support on RW during ambulation but can take hands off RW to pull down/up underwear while using toilet.                            Cognition Arousal/Alertness: Awake/alert Behavior During Therapy: WFL for tasks assessed/performed Overall Cognitive Status: Within Functional Limits for tasks assessed  Exercises      General Comments General comments (skin integrity, edema, etc.):  (Pt on RA throughout session, O2 sats remained in mid to upper 90's. Education and cues  given for pacing and relaxation techniques)      Pertinent Vitals/Pain Pain Assessment Pain Assessment: No/denies pain    Home Living                          Prior Function            PT Goals (current goals can now be found in the care plan section) Acute Rehab PT Goals Patient Stated Goal: Get better and go home Progress towards PT goals: Progressing toward goals    Frequency    Min 2X/week      PT Plan Current plan remains appropriate    Co-evaluation              AM-PAC PT "6 Clicks" Mobility   Outcome Measure  Help needed turning from your back to your side while in a flat bed without using bedrails?: A Little Help needed moving from lying on your back to sitting on the side of a flat bed without using bedrails?: A Little Help needed moving to and from a bed to a chair (including a wheelchair)?: A Little Help needed standing up from a chair using your arms (e.g., wheelchair or bedside chair)?: A Little Help needed to walk in hospital room?: A Little Help needed climbing 3-5 steps with a railing? : Total 6 Click Score: 16    End of Session Equipment Utilized During Treatment: Gait belt Activity Tolerance: Patient limited by fatigue Patient left: in chair;with call bell/phone within reach;with chair alarm set;with family/visitor present Nurse Communication: Mobility status PT Visit Diagnosis: Difficulty in walking, not elsewhere classified (R26.2);Muscle weakness (generalized) (M62.81);Unsteadiness on feet (R26.81)     Time: 1020-1045 PT Time Calculation (min) (ACUTE ONLY): 25 min  Charges:  $Gait Training: 8-22 mins $Therapeutic Activity: 8-22 mins                    Victoria Dalton, PTA    Victoria Dalton 03/11/2022, 11:52 AM

## 2022-03-11 NOTE — TOC Progression Note (Signed)
Transition of Care [facility]) - Progression Note    Patient Details  Name: Victoria Dalton MRN: [id number] Date of Birth: 04-Jun-1930  Transition of Care [facility]) CM/SW [place], LCSW Phone Number: 03/11/2022, 12:47 PM  Clinical Narrative:   Patient is agreeable to SNF placement. [facility] is first preference and [facility] is second preference. [facility] does not have any beds at this time. Asked [facility] admissions coordinator to review referral.  Expected Discharge Plan:  (TBD) Barriers to Discharge: Continued Medical Work up  Expected Discharge Plan and Services Expected Discharge Plan:  (TBD)     Post Acute Care Choice:  (TBD) Living arrangements for the past 2 months: Single Family Home                                       Social Determinants of Health (SDOH) Interventions    Readmission Risk Interventions     No data to display

## 2022-03-11 NOTE — Progress Notes (Signed)
Occupational Therapy Treatment Patient Details Name: NAELA NODAL MRN: [id number] DOB: [birth year] Today's Date: 03/11/2022   History of present illness LANGLEY FLATLEY is a 88 y.o. female with medical history significant for hypertension, hyperlipidemia, diabetes mellitus, history of UTIs, macular degeneration, kidney stones, hypertensive retinopathy, who presented to the hospital with shortness of breath.  This started about 2 weeks ago and has progressively gotten worse.  She also noticed increasing lower extremity swelling in the preceding week prior to admission. She was found to have atrial fibrillation with RVR, acute congestive heart failure complicated by acute hypoxic respiratory failure.   OT comments  Pt seen for OT session this date.  Pt tolerated functional transfers with supv, amb with RW to bathroom for toileting with supv.  Pt able to tolerate standing for lowering and hiking panties with close SBA.  Performed oral care while seated on commode d/t pt tachycardic.  Encouraged pt perform seated ADLs d/t tachycardia, perform activities slowly with frequent rest breaks.  HR 100 at rest, up to 148 with activity.  RN notified of tachycardia.  Pt back in chair end of session with all necessary items within reach.  Continue to recommend STR upon d/c for increasing activity tolerance and indep with ADLs and functional mobility.    Recommendations for follow up therapy are one component of a multi-disciplinary discharge planning process, led by the attending physician.  Recommendations may be updated based on patient status, additional functional criteria and insurance authorization.    Follow Up Recommendations  Skilled nursing-short term rehab (<3 hours/day)    Assistance Recommended at Discharge Frequent or constant Supervision/Assistance  Patient can return home with the following  A little help with walking and/or transfers;Assistance with cooking/housework;A little help with  bathing/dressing/bathroom   Equipment Recommendations  Other (comment) (defer to next venue of care)    Recommendations for Other Services      Precautions / Restrictions Precautions Precautions: Fall Precaution Comments: tachycardic Restrictions Weight Bearing Restrictions: No       Mobility Bed Mobility               General bed mobility comments: NT today as pt was up in recliner upon OT arrival and left in recliner end of session Patient Response: Cooperative  Transfers Overall transfer level: Needs assistance Equipment used: Rolling walker (2 wheels) Transfers: Sit to/from Stand Sit to Stand: Min guard                 Balance Overall balance assessment: Needs assistance Sitting-balance support: No upper extremity supported, Feet supported Sitting balance-Leahy Scale: Good Sitting balance - Comments: steady sitting on commode   Standing balance support: During functional activity, Reliant on assistive device for balance Standing balance-Leahy Scale: Fair Standing balance comment: Pt was able to stand at commode and lower and hike panties without support from walker                           ADL either performed or assessed with clinical judgement   ADL Overall ADL's : Needs assistance/impaired     Grooming: Oral care;Set up;Sitting Grooming Details (indicate cue type and reason): Performed in sitting d/t tachycardia                 Toilet Transfer: Supervision/safety;BSC/3in1;Rolling walker (2 wheels) Toilet Transfer Details (indicate cue type and reason): 3in1 over toilet Toileting- Clothing Manipulation and Hygiene: Supervision/safety;With adaptive equipment;Sit to/from stand Toileting - Water quality scientist Details (  indicate cue type and reason): RW     Functional mobility during ADLs: Supervision/safety;Rolling walker (2 wheels) General ADL Comments: limited ambulation to bathroom for toileting and back to chair d/t  tachycardia    Extremity/Trunk Assessment Upper Extremity Assessment Upper Extremity Assessment: Generalized weakness   Lower Extremity Assessment Lower Extremity Assessment: Generalized weakness   Cervical / Trunk Assessment Cervical / Trunk Assessment: Normal    Vision Baseline Vision/History: 1 Wears glasses Patient Visual Report: No change from baseline                Cognition Arousal/Alertness: Awake/alert Behavior During Therapy: WFL for tasks assessed/performed Overall Cognitive Status: Within Functional Limits for tasks assessed                                          Exercises Other Exercises Other Exercises: Educ on EC strategies; encouraged seated ADLs d/t tachycardia, perform activities slowly with frequent rest breaks.    Shoulder Instructions       General Comments  (Pt on RA throughout session, O2 sats remained in mid to upper 90's. Education and cues given for pacing and relaxation techniques)    Pertinent Vitals/ Pain       Pain Assessment Pain Assessment: No/denies pain                                            Prior Functioning/Environment              Frequency  Min 2X/week        Progress Toward Goals  OT Goals(current goals can now be found in the care plan section)  Progress towards OT goals: Progressing toward goals  Acute Rehab OT Goals Patient Stated Goal: get stronger OT Goal Formulation: With patient/family Time For Goal Achievement: 03/22/22 Potential to Achieve Goals: Good  Plan Discharge plan remains appropriate                     AM-PAC OT "6 Clicks" Daily Activity     Outcome Measure   Help from another person eating meals?: None Help from another person taking care of personal grooming?: A Little Help from another person toileting, which includes using toliet, bedpan, or urinal?: A Little Help from another person bathing (including washing, rinsing, drying)?:  A Little Help from another person to put on and taking off regular upper body clothing?: A Little Help from another person to put on and taking off regular lower body clothing?: A Lot 6 Click Score: 18    End of Session Equipment Utilized During Treatment: Rolling walker (2 wheels);Gait belt  OT Visit Diagnosis: Other abnormalities of gait and mobility (R26.89);Muscle weakness (generalized) (M62.81)   Activity Tolerance Other (comment) (limited by tachycardia)   Patient Left in chair;with call bell/phone within reach   Nurse Communication Mobility status (tachycardic)        Time: 3009-2330 OT Time Calculation (min): 21 min  Charges: OT General Charges $OT Visit: 1 Visit OT Treatments $Self Care/Home Management : 8-22 mins  Leta Speller, MS, OTR/L   Darleene Cleaver 03/11/2022, 2:17 PM

## 2022-03-11 NOTE — Consult Note (Signed)
   Heart Failure Nurse Navigator Note  HFmrEF 50-55%.  Moderate concentric LVH.  Grade 1 diastolic dysfunction.  Moderate mitral regurgitation.  She presented to the emergency room with complaints of worsening shortness of breath,.  Dyspnea on exertion for approximately 2 weeks.  Also noted bendopnea.  BNP 495.  EKG revealed atrial fibrillation RVR.  Chest x-ray with pulmonary edema and bilateral pleural effusions.  Comorbidities:  Diabetes GERD Hyperlipidemia Hypertension   Medications:  Apixaban 5 mg 2 times a day Diltiazem 240 mg daily Furosemide 20 mg IV every 12 hours Metoprolol tartrate 100 mg 2 times a day  Labs:  Sodium 136, potassium 4, chloride 98, CO2 28, BUN 25, creatinine 1.18, GFR 44 Weight is 120.5 kg Blood pressure 102/67 Intake 240 mL Output 400 mL  Today with patient and daughter was at the bedside.  Patient was sitting up in the chair.  Had met with the daughter yesterday and went over diet, fluid restriction, importance of daily weights, changes in symptoms to report.  Went over that again today with the patient.  She had good questions about keeping track of her fluid restriction, she had mentioned getting 1/2 gallon jug and using that.  I told her that is a very good way to keep track of her fluid restriction.    She is being discharged to a skilled facility.  They had no further question and will continue to follow along.  Pricilla Riffle RN CHFN

## 2022-03-11 NOTE — NC FL2 (Signed)
[place] LEVEL OF CARE SCREENING TOOL     IDENTIFICATION  Patient Name: Victoria Dalton Birthdate: Aug 21, 1930 Sex: female Admission Date (Current Location): 03/08/2022  [facility] and [place] Number:  Engineering geologist and Address:  [facility], [address], [place], [place] [address]      Provider Number: [id number]  Attending Physician Name and Address:  Jennye Boroughs, MD  Relative Name and Phone Number:       Current Level of Care: Hospital Recommended Level of Care: [place] Prior Approval Number:    Date Approved/Denied:   PASRR Number: [id number]  Discharge Plan: SNF    Current Diagnoses: Patient Active Problem List   Diagnosis Date Noted   Atrial fibrillation with RVR ([place]) 03/08/2022   Chronic kidney disease (CKD), stage III (moderate) ([place]) 03/08/2022   Acute on chronic diastolic CHF (congestive heart failure) ([place]) 03/08/2022   Acute respiratory failure with hypoxia ([place])    Lymph node enlargement 05/01/2021   Need for shingles vaccine 05/01/2021   Gastroenteritis 10/12/2020   Candidiasis of breast 05/31/2020   Need for influenza vaccination 03/29/2020   Class 3 severe obesity due to excess calories in adult ([place]) 11/16/2019   Vaginitis 10/25/2019   Blood in stool    Gallbladder mass    Cholangitis    Cholecystitis    Right upper quadrant pain    Hyperbilirubinemia    Protein-calorie malnutrition, severe 01/11/2016   Cholecystitis with cholelithiasis 01/10/2016   Benign essential HTN 12/06/2015   Dyslipidemia 05/23/2015   Gastro-esophageal reflux disease without esophagitis 05/08/2014   Controlled type 2 diabetes mellitus without complication ([place]) 58/01/9832   Type 2 diabetes mellitus without complication ([place]) 82/50/5397    Orientation RESPIRATION BLADDER Height & Weight     Self, Time, Situation, Place  Normal Continent Weight: 265 lb 10.5 oz (120.5 kg) Height:     BEHAVIORAL  SYMPTOMS/MOOD NEUROLOGICAL BOWEL NUTRITION STATUS   (None)  (None) Continent Diet (Heart healthy/carb modified)  AMBULATORY STATUS COMMUNICATION OF NEEDS Skin   Limited Assist Verbally Normal                       Personal Care Assistance Level of Assistance  Bathing, Feeding, Dressing Bathing Assistance: Maximum assistance Feeding assistance: Limited assistance Dressing Assistance: Maximum assistance     Functional Limitations Info  Sight, Hearing, Speech Sight Info: Adequate Hearing Info: Adequate Speech Info: Adequate    SPECIAL CARE FACTORS FREQUENCY  PT (By licensed PT), OT (By licensed OT)     PT Frequency: 5 x week OT Frequency: 5 x week            Contractures Contractures Info: Not present    Additional Factors Info  Code Status, Allergies Code Status Info: DNR Allergies Info: Chlorhexidine, Ramipril, Penicillins, Poison Oak Extract, Sulfa Antibiotics           Current Medications (03/11/2022):  This is the current hospital active medication list Current Facility-Administered Medications  Medication Dose Route Frequency Provider Last Rate Last Admin   acetaminophen (TYLENOL) tablet 650 mg  650 mg Oral Q4H PRN Jose Persia, MD   650 mg at 03/09/22 2323   apixaban (ELIQUIS) tablet 5 mg  5 mg Oral BID Jose Persia, MD   5 mg at 03/11/22 0816   diltiazem (CARDIZEM CD) 24 hr capsule 240 mg  240 mg Oral Daily Corey Skains, MD   240 mg at 03/11/22 0816   docusate  sodium (COLACE) capsule 100 mg  100 mg Oral BID Jennye Boroughs, MD   100 mg at 03/10/22 0937   furosemide (LASIX) injection 20 mg  20 mg Intravenous Q12H Callwood, Dwayne D, MD   20 mg at 03/11/22 0640   insulin aspart (novoLOG) injection 0-15 Units  0-15 Units Subcutaneous TID WC Jose Persia, MD   5 Units at 03/11/22 1144   metoprolol tartrate (LOPRESSOR) tablet 100 mg  100 mg Oral BID Corey Skains, MD       ondansetron [facility]) injection 4 mg  4 mg Intravenous Q6H PRN Jose Persia, MD       Oral care mouth rinse  15 mL Mouth Rinse PRN Jennye Boroughs, MD       pantoprazole (PROTONIX) EC tablet 40 mg  40 mg Oral Daily Jose Persia, MD   40 mg at 03/11/22 0816   sodium chloride flush (NS) 0.9 % injection 3 mL  3 mL Intravenous Q12H Jennye Boroughs, MD   3 mL at 03/11/22 0141     Discharge Medications: Please see discharge summary for a list of discharge medications.  Relevant Imaging Results:  Relevant Lab Results:   Additional Information SS#: [id number]  Candie Chroman, LCSW

## 2022-03-11 NOTE — Progress Notes (Signed)
   03/11/22 0727  Assess: MEWS Score  Temp 98.2 F (36.8 C)  BP 109/75  MAP (mmHg) 85  Pulse Rate (!) 129  Resp 19  SpO2 93 %  O2 Device Room Air  Assess: MEWS Score  MEWS Temp 0  MEWS Systolic 0  MEWS Pulse 2  MEWS RR 0  MEWS LOC 0  MEWS Score 2  MEWS Score Color Yellow  Assess: if the MEWS score is Yellow or Red  Were vital signs taken at a resting state? Yes  Focused Assessment No change from prior assessment  Does the patient meet 2 or more of the SIRS criteria? Yes  Does the patient have a confirmed or suspected source of infection? No  MEWS guidelines implemented *See Row Information* Yes  Treat  MEWS Interventions Administered scheduled meds/treatments  Take Vital Signs  Increase Vital Sign Frequency  Yellow: Q 2hr X 2 then Q 4hr X 2, if remains yellow, continue Q 4hrs  Escalate  MEWS: Escalate Yellow: discuss with charge nurse/RN and consider discussing with provider and RRT  Notify: Charge Nurse/RN  Name of Charge Nurse/RN Notified Amy  Date Charge Nurse/RN Notified 03/11/22  Time Charge Nurse/RN Notified 0826  Assess: SIRS CRITERIA  SIRS Temperature  0  SIRS Pulse 1  SIRS Respirations  0  SIRS WBC 1  SIRS Score Sum  2

## 2022-03-11 NOTE — Progress Notes (Signed)
[facility] Cardiology [facility] Encounter Note  Patient: Victoria Dalton / Admit Date: 03/08/2022 / Date of Encounter: 03/11/2022, 8:55 AM   Subjective: Overall patient feels well today with no evidence of congestive heart failure or anginal symptoms.  We have made some adjustments to her medication management for atrial fibrillation with rapid ventricular rate.  The patient still has more rapid rate with ambulation and will need further adjustments.  Review of Systems: Positive for: None Negative for: Vision change, hearing change, syncope, dizziness, nausea, vomiting,diarrhea, bloody stool, stomach pain, cough, congestion, diaphoresis, urinary frequency, urinary pain,skin lesions, skin rashes Others previously listed  Objective: Telemetry: Atrial fibrillation Physical Exam: Blood pressure 109/75, pulse (!) 129, temperature 98.2 F (36.8 C), resp. rate 19, weight 120.5 kg, SpO2 93 %. Body mass index is 39.23 kg/m. General: Well developed, well nourished, in no acute distress. Head: Normocephalic, atraumatic, sclera non-icteric, no xanthomas, nares are without discharge. Neck: No apparent masses Lungs: Normal respirations with no wheezes, no rhonchi, no rales , no crackles   Heart: Irregular ate and rhythm, normal S1 S2, no murmur, no rub, no gallop, PMI is normal size and placement, carotid upstroke normal without bruit, jugular venous pressure normal Abdomen: Soft, non-tender, non-distended with normoactive bowel sounds. No hepatosplenomegaly. Abdominal aorta is normal size without bruit Extremities: No edema, no clubbing, no cyanosis, no ulcers,  Peripheral: 2+ radial, 2+ femoral, 2+ dorsal pedal pulses Neuro: Alert and oriented. Moves all extremities spontaneously. Psych:  Responds to questions appropriately with a normal affect.   Intake/Output Summary (Last 24 hours) at 03/11/2022 0855 Last data filed at 03/10/2022 1921 Gross per 24 hour  Intake 240 ml  Output 400 ml   Net -160 ml    Inpatient Medications:   apixaban  5 mg Oral BID   diltiazem  240 mg Oral Daily   docusate sodium  100 mg Oral BID   furosemide  20 mg Intravenous Q12H   insulin aspart  0-15 Units Subcutaneous TID WC   metoprolol tartrate  100 mg Oral BID   pantoprazole  40 mg Oral Daily   sodium chloride flush  3 mL Intravenous Q12H   Infusions:   Labs: Recent Labs    03/10/22 0514 03/11/22 0430  NA 136 136  K 4.1 4.0  CL 98 98  CO2 27 28  GLUCOSE 184* 163*  BUN 23 25*  CREATININE 1.25* 1.18*  CALCIUM 8.8* 8.9  MG 2.1 2.1   Recent Labs    03/08/22 1124  AST 32  ALT 36  ALKPHOS 113  BILITOT 1.1  PROT 6.7  ALBUMIN 3.8   Recent Labs    03/08/22 1124 03/09/22 0515 03/10/22 0514  WBC 8.9 11.4* 9.9  NEUTROABS 7.0  --   --   HGB 12.1 13.0 12.4  HCT 37.2 40.3 37.6  MCV 94.2 94.6 92.2  PLT 192 212 213   No results for input(s): "CKTOTAL", "CKMB", "TROPONINI" in the last 72 hours. Invalid input(s): "POCBNP" No results for input(s): "HGBA1C" in the last 72 hours.   Weights: Filed Weights   03/08/22 1106 03/10/22 0554 03/11/22 0500  Weight: 122.9 kg 119.4 kg 120.5 kg     Radiology/Studies:  ECHOCARDIOGRAM COMPLETE  Result Date: 03/09/2022    ECHOCARDIOGRAM REPORT   Patient Name:   Victoria Dalton Date of Exam: 03/09/2022 Medical Rec #:  [id number]         Height:       69.0 in Accession #:    [id number]  Weight:       271.0 lb Date of Birth:  May 30, 1930         BSA:          2.351 m Patient Age:    88 years          BP:           124/95 mmHg Patient Gender: F                 HR:           112 bpm. Exam Location:  [facility] Procedure: 2D Echo Indications:     CHF I50.9                  Atrial Fibrillation I48.91  History:         Patient has no prior history of Echocardiogram examinations.  Sonographer:     Kathlen Brunswick RDCS Referring Phys:  [id number] Jose Persia Diagnosing Phys: Yolonda Kida MD IMPRESSIONS  1. Left ventricular ejection fraction, by  estimation, is 50 to 55%. The left ventricle has low normal function. The left ventricle has no regional wall motion abnormalities. There is moderate concentric left ventricular hypertrophy. Left ventricular diastolic parameters are consistent with Grade I diastolic dysfunction (impaired relaxation).  2. Right ventricular systolic function is normal. The right ventricular size is normal.  3. The mitral valve is normal in structure. Moderate mitral valve regurgitation.  4. The aortic valve is normal in structure. Aortic valve regurgitation is not visualized. FINDINGS  Left Ventricle: Left ventricular ejection fraction, by estimation, is 50 to 55%. The left ventricle has low normal function. The left ventricle has no regional wall motion abnormalities. The left ventricular internal cavity size was normal in size. There is moderate concentric left ventricular hypertrophy. Left ventricular diastolic parameters are consistent with Grade I diastolic dysfunction (impaired relaxation). Right Ventricle: The right ventricular size is normal. No increase in right ventricular wall thickness. Right ventricular systolic function is normal. Left Atrium: Left atrial size was normal in size. Right Atrium: Right atrial size was normal in size. Pericardium: There is no evidence of pericardial effusion. Mitral Valve: The mitral valve is normal in structure. Moderate mitral valve regurgitation. Tricuspid Valve: The tricuspid valve is normal in structure. Tricuspid valve regurgitation is mild. Aortic Valve: The aortic valve is normal in structure. Aortic valve regurgitation is not visualized. Aortic valve peak gradient measures 13.6 mmHg. Pulmonic Valve: The pulmonic valve was normal in structure. Pulmonic valve regurgitation is not visualized. Aorta: The ascending aorta was not well visualized. IAS/Shunts: No atrial level shunt detected by color flow Doppler.  LEFT VENTRICLE PLAX 2D LVIDd:         4.30 cm     Diastology LVIDs:          3.30 cm     LV e' medial:    8.59 cm/s LV PW:         1.50 cm     LV E/e' medial:  14.6 LV IVS:        1.30 cm     LV e' lateral:   10.40 cm/s LVOT diam:     2.00 cm     LV E/e' lateral: 12.0 LV SV:         47 LV SV Index:   20 LVOT Area:     3.14 cm  LV Volumes (MOD) LV vol d, MOD A2C: 42.4 ml LV vol d, MOD A4C: 40.2 ml LV vol s, MOD  A2C: 19.6 ml LV vol s, MOD A4C: 19.3 ml LV SV MOD A2C:     22.8 ml LV SV MOD A4C:     40.2 ml LV SV MOD BP:      21.7 ml RIGHT VENTRICLE RV Basal diam:  2.10 cm RV S prime:     9.46 cm/s TAPSE (M-mode): 1.5 cm LEFT ATRIUM             Index        RIGHT ATRIUM           Index LA diam:        3.50 cm 1.49 cm/m   RA Area:     13.40 cm LA Vol (A2C):   48.6 ml 20.67 ml/m  RA Volume:   29.80 ml  12.68 ml/m LA Vol (A4C):   35.0 ml 14.89 ml/m LA Biplane Vol: 42.1 ml 17.91 ml/m  AORTIC VALVE                 PULMONIC VALVE AV Area (Vmax): 1.64 cm     PV Vmax:       0.85 m/s AV Vmax:        184.50 cm/s  PV Peak grad:  2.9 mmHg AV Peak Grad:   13.6 mmHg LVOT Vmax:      96.44 cm/s LVOT Vmean:     63.520 cm/s LVOT VTI:       0.150 m  AORTA Ao Root diam: 3.10 cm Ao Asc diam:  3.30 cm MITRAL VALVE                  TRICUSPID VALVE MV Area (PHT): 3.50 cm       TV Peak grad:   30.1 mmHg MV Decel Time: 217 msec       TV Vmax:        2.74 m/s MR Peak grad:    116.6 mmHg MR Mean grad:    84.0 mmHg    SHUNTS MR Vmax:         540.00 cm/s  Systemic VTI:  0.15 m MR Vmean:        443.0 cm/s   Systemic Diam: 2.00 cm MR PISA:         1.57 cm MR PISA Eff ROA: 9 mm MR PISA Radius:  0.50 cm MV E velocity: 125.00 cm/s Yolonda Kida MD Electronically signed by Yolonda Kida MD Signature Date/Time: 03/09/2022/2:58:42 PM    Final    DG Chest 2 View  Result Date: 03/08/2022 CLINICAL DATA:  Chest pain and shortness of breath. EXAM: CHEST - 2 VIEW COMPARISON:  Earlier sameDay. FINDINGS: Low lung volumes. Bibasilar collapse/consolidation is associated with small bilateral pleural effusions.  Interstitial markings are diffusely coarsened with chronic features. The visualized bony structures of the thorax are unremarkable. Telemetry leads overlie the chest. IMPRESSION: Low volume film with bibasilar collapse/consolidation and small bilateral pleural effusions. Electronically Signed   By: Misty Stanley M.D.   On: 03/08/2022 11:59   DG Chest 2 View  Result Date: 03/08/2022 CLINICAL DATA:  Shortness of breath EXAM: CHEST - 2 VIEW COMPARISON:  10/14/2015 FINDINGS: Heart size is mildly enlarged. Aortic atherosclerosis. Small right pleural effusion. Streaky bibasilar opacities, right greater than left. No pneumothorax. IMPRESSION: 1. Small right pleural effusion. 2. Streaky bibasilar opacities, right greater than left, may reflect atelectasis and/or pneumonia. Electronically Signed   By: Davina Poke D.O.   On: 03/08/2022 10:14   OCT, Retina - OU -  Both Eyes  Result Date: 02/10/2022 Right Eye Quality was good. Central Foveal Thickness: 239. Progression has been stable. Findings include normal foveal contour, no IRF, no SRF, retinal drusen , subretinal hyper-reflective material, outer retinal atrophy (persistent focal central SRHM -- vitelliform like lesion; no central SRF, patchy ORA centrally). Left Eye Quality was good. Central Foveal Thickness: 245. Progression has been stable. Findings include normal foveal contour, no IRF, no SRF, subretinal hyper-reflective material, outer retinal atrophy (focal central SRHM; no SRF, patchy ORA centrally). Notes *Images captured and stored on drive Diagnosis / Impression: NFP, no IRF, no SRF OU OD: persistent focal central SRHM -- vitelliform like lesion; patchy ORA OS: focal central SRHM; patchy ORA Clinical management: See below Abbreviations: NFP - Normal foveal profile. CME - cystoid macular edema. PED - pigment epithelial detachment. IRF - intraretinal fluid. SRF - subretinal fluid. EZ - ellipsoid zone. ERM - epiretinal membrane. ORA - outer retinal  atrophy. ORT - outer retinal tubulation. SRHM - subretinal hyper-reflective material. IRHM - intraretinal hyper-reflective material     Assessment and Recommendation  88 y.o. female with new onset atrial fibrillation with rapid ventricular rate causing diastolic dysfunction congestive heart failure and shortness of breath now without evidence of symptoms of heart failure or angina. 1.  Increase medication management including diltiazem 240 mg each day and metoprolol 100 mg twice per day for better heart rate control with a goal heart rate below 100 bpm at rest 2.  Continue anticoagulation with Eliquis 5 mg twice per day for further risk reduction of stroke 3.  No further cardiac diagnostics necessary at this time 4.  If patient ambulating well would be okay for discharge home with further adjustments and assessment as outpatient  Signed, Serafina Royals M.D. FACC

## 2022-03-11 NOTE — Progress Notes (Addendum)
Progress Note    Victoria Dalton  ZOX:[id number] DOB: September 01, 1930  DOA: 03/08/2022 PCP: Corky Downs, MD      Brief Narrative:    Medical records reviewed and are as summarized below:  Victoria Dalton is a 88 y.o. female with medical history significant for hypertension, hyperlipidemia, diabetes mellitus, history of UTIs, macular degeneration, kidney stones, hypertensive retinopathy, who presented to the hospital with shortness of breath.  This started about 2 weeks ago and has progressively gotten worse. She also noticed increasing lower extremity swelling in the preceding week prior to admission.   She was found to have atrial fibrillation with RVR, acute congestive heart failure complicated by acute hypoxic respiratory failure.    Assessment/Plan:   Principal Problem:   Atrial fibrillation with RVR (HCC) Active Problems:   Acute on chronic diastolic CHF (congestive heart failure) (HCC)   Acute respiratory failure with hypoxia (HCC)   Chronic kidney disease (CKD), stage III (moderate) (HCC)   Benign essential HTN   Controlled type 2 diabetes mellitus without complication (HCC)   Dyslipidemia    Body mass index is 39.23 kg/m.  (Morbid obesity)  Acute on chronic diastolic CHF: Continue IV Lasix.  No evidence of reversible ischemia on nuclear stress test.  Monitor BMP, daily weight and urine output. 2D echo showed EF estimated at 50 to 55%, moderate concentric LVH, grade 1 diastolic dysfunction, moderate mitral regurgitation.    Atrial fibrillation with RVR: She is on oral Cardizem and oral metoprolol.  Continue Eliquis.  CHA2DS2-VASc score is 5.  Follow-up with cardiologist.  Acute hypoxic respiratory failure: Improved  CKD stage IIIa: Creatinine is stable.  Urinalysis is not impressive.  Doubt UTI at this time.  Generalized weakness: PT and OT recommended discharge to SNF.  Follow-up with social worker to assist with disposition.  Other comorbidities  include type II DM (hemoglobin A1c was 8.1% a week prior to admission), dyslipidemia, hypertension   Diet Order             Diet heart healthy/carb modified Room service appropriate? Yes; Fluid consistency: Thin  Diet effective now                            Consultants: Cardiologist  Procedures: None    Medications:    apixaban  5 mg Oral BID   diltiazem  240 mg Oral Daily   docusate sodium  100 mg Oral BID   furosemide  20 mg Intravenous Q12H   insulin aspart  0-15 Units Subcutaneous TID WC   metoprolol tartrate  100 mg Oral BID   pantoprazole  40 mg Oral Daily   sodium chloride flush  3 mL Intravenous Q12H   Continuous Infusions:     Anti-infectives (From admission, onward)    None       Status is: Inpatient Remains inpatient appropriate because: IV Lasix, PT is recommending discharge to SNF        Family Communication/Anticipated D/C date and plan/Code Status   DVT prophylaxis:  apixaban (ELIQUIS) tablet 5 mg     Code Status: DNR  Family Communication: Plan discussed with Cordelia Pen, daughter, at the bedside  Disposition Plan: Plan to discharge home tomorrow          Subjective:   Interval events noted.  No shortness of breath or chest pain.  She still has swelling in her legs.  Heart rate went up into the 140s with activity (  during session with OT) this morning.  Sherri, daughter, was at the bedside  Objective:    Vitals:   03/11/22 0500 03/11/22 0727 03/11/22 0930 03/11/22 1123  BP: 121/81 109/75 99/69 102/67  Pulse: (!) 109 (!) 129 (!) 108 (!) 117  Resp: 18 19  20   Temp: 97.7 F (36.5 C) 98.2 F (36.8 C)  98.2 F (36.8 C)  TempSrc: Oral   Oral  SpO2: 93% 93% 91% 92%  Weight: 120.5 kg      No data found.   Intake/Output Summary (Last 24 hours) at 03/11/2022 1447 Last data filed at 03/11/2022 0935 Gross per 24 hour  Intake 600 ml  Output --  Net 600 ml   Filed Weights   03/08/22 1106 03/10/22 0554  03/11/22 0500  Weight: 122.9 kg 119.4 kg 120.5 kg    Exam:  GEN: NAD SKIN: Warm and dry EYES: No pallor or icterus ENT: MMM CV: Irregular rate and rhythm, tachycardic PULM: CTA B ABD: soft, ND, NT, +BS CNS: AAO x 3, non focal EXT: Mild bilateral leg edema, no tenderness        Data Reviewed:   I have personally reviewed following labs and imaging studies:  Labs: Labs show the following:   Basic Metabolic Panel: Recent Labs  Lab 03/08/22 1124 03/09/22 0515 03/10/22 0514 03/11/22 0430  NA 138 139 136 136  K 4.4 4.1 4.1 4.0  CL 107 101 98 98  CO2 23 27 27 28   GLUCOSE 212* 162* 184* 163*  BUN 23 20 23  25*  CREATININE 1.26* 1.14* 1.25* 1.18*  CALCIUM 9.0 9.2 8.8* 8.9  MG  --  2.1 2.1 2.1   GFR Estimated Creatinine Clearance: 44.9 mL/min (A) (by C-G formula based on SCr of 1.18 mg/dL (H)). Liver Function Tests: Recent Labs  Lab 03/08/22 1124  AST 32  ALT 36  ALKPHOS 113  BILITOT 1.1  PROT 6.7  ALBUMIN 3.8   No results for input(s): "LIPASE", "AMYLASE" in the last 168 hours. No results for input(s): "AMMONIA" in the last 168 hours. Coagulation profile No results for input(s): "INR", "PROTIME" in the last 168 hours.  CBC: Recent Labs  Lab 03/08/22 1124 03/09/22 0515 03/10/22 0514  WBC 8.9 11.4* 9.9  NEUTROABS 7.0  --   --   HGB 12.1 13.0 12.4  HCT 37.2 40.3 37.6  MCV 94.2 94.6 92.2  PLT 192 212 213   Cardiac Enzymes: No results for input(s): "CKTOTAL", "CKMB", "CKMBINDEX", "TROPONINI" in the last 168 hours. BNP (last 3 results) No results for input(s): "PROBNP" in the last 8760 hours. CBG: Recent Labs  Lab 03/10/22 0903 03/10/22 1609 03/10/22 2022 03/11/22 0727 03/11/22 1120  GLUCAP 170* 212* 240* 181* 206*   D-Dimer: No results for input(s): "DDIMER" in the last 72 hours. Hgb A1c: No results for input(s): "HGBA1C" in the last 72 hours. Lipid Profile: No results for input(s): "CHOL", "HDL", "LDLCALC", "TRIG", "CHOLHDL",  "LDLDIRECT" in the last 72 hours. Thyroid function studies: No results for input(s): "TSH", "T4TOTAL", "T3FREE", "THYROIDAB" in the last 72 hours.  Invalid input(s): "FREET3"  Anemia work up: No results for input(s): "VITAMINB12", "FOLATE", "FERRITIN", "TIBC", "IRON", "RETICCTPCT" in the last 72 hours. Sepsis Labs: Recent Labs  Lab 03/08/22 1124 03/09/22 0515 03/10/22 0514  WBC 8.9 11.4* 9.9    Microbiology Recent Results (from the past 240 hour(s))  Resp Panel by RT-PCR (Flu A&B, Covid) Anterior Nasal Swab     Status: None   Collection Time: 03/08/22 12:02  PM   Specimen: Anterior Nasal Swab  Result Value Ref Range Status   SARS Coronavirus 2 by RT PCR NEGATIVE NEGATIVE Final    Comment: (NOTE) SARS-CoV-2 target nucleic acids are NOT DETECTED.  The SARS-CoV-2 RNA is generally detectable in upper respiratory specimens during the acute phase of infection. The lowest concentration of SARS-CoV-2 viral copies this assay can detect is 138 copies/mL. A negative result does not preclude SARS-Cov-2 infection and should not be used as the sole basis for treatment or other patient management decisions. A negative result may occur with  improper specimen collection/handling, submission of specimen other than nasopharyngeal swab, presence of viral mutation(s) within the areas targeted by this assay, and inadequate number of viral copies(<138 copies/mL). A negative result must be combined with clinical observations, patient history, and epidemiological information. The expected result is Negative.  Fact Sheet for Patients:  BloggerCourse.com  Fact Sheet for Healthcare Providers:  SeriousBroker.it  This test is no t yet approved or cleared by the [place] FDA and  has been authorized for detection and/or diagnosis of SARS-CoV-2 by FDA under an Emergency Use Authorization (EUA). This EUA will remain  in effect (meaning this test  can be used) for the duration of the COVID-19 declaration under Section 564(b)(1) of the Act, 21 U.S.C.section 360bbb-3(b)(1), unless the authorization is terminated  or revoked sooner.       Influenza A by PCR NEGATIVE NEGATIVE Final   Influenza B by PCR NEGATIVE NEGATIVE Final    Comment: (NOTE) The Xpert Xpress SARS-CoV-2/FLU/RSV plus assay is intended as an aid in the diagnosis of influenza from Nasopharyngeal swab specimens and should not be used as a sole basis for treatment. Nasal washings and aspirates are unacceptable for Xpert Xpress SARS-CoV-2/FLU/RSV testing.  Fact Sheet for Patients: BloggerCourse.com  Fact Sheet for Healthcare Providers: SeriousBroker.it  This test is not yet approved or cleared by the [place] FDA and has been authorized for detection and/or diagnosis of SARS-CoV-2 by FDA under an Emergency Use Authorization (EUA). This EUA will remain in effect (meaning this test can be used) for the duration of the COVID-19 declaration under Section 564(b)(1) of the Act, 21 U.S.C. section 360bbb-3(b)(1), unless the authorization is terminated or revoked.  Performed at [facility], [address] [place], [place] [address]     Procedures and diagnostic studies:  No results found.             LOS: 2 days   Taelyn Nemes  [facility]   Pager on [email or web address]. If 7PM-7AM, please contact night-coverage at [email or web address]     03/11/2022, 2:47 PM

## 2022-03-11 NOTE — Plan of Care (Signed)
  Problem: Coping: Goal: Ability to adjust to condition or change in health will improve Outcome: Progressing   Problem: Nutritional: Goal: Maintenance of adequate nutrition will improve Outcome: Progressing   Problem: Skin Integrity: Goal: Risk for impaired skin integrity will decrease Outcome: Progressing   Problem: Activity: Goal: Risk for activity intolerance will decrease Outcome: Progressing

## 2022-03-12 DIAGNOSIS — Z79899 Other long term (current) drug therapy: Secondary | ICD-10-CM | POA: Diagnosis not present

## 2022-03-12 DIAGNOSIS — I5032 Chronic diastolic (congestive) heart failure: Secondary | ICD-10-CM | POA: Diagnosis not present

## 2022-03-12 DIAGNOSIS — E1122 Type 2 diabetes mellitus with diabetic chronic kidney disease: Secondary | ICD-10-CM | POA: Diagnosis not present

## 2022-03-12 DIAGNOSIS — I2510 Atherosclerotic heart disease of native coronary artery without angina pectoris: Secondary | ICD-10-CM | POA: Diagnosis not present

## 2022-03-12 DIAGNOSIS — N182 Chronic kidney disease, stage 2 (mild): Secondary | ICD-10-CM | POA: Diagnosis not present

## 2022-03-12 DIAGNOSIS — M6281 Muscle weakness (generalized): Secondary | ICD-10-CM | POA: Diagnosis not present

## 2022-03-12 DIAGNOSIS — R531 Weakness: Secondary | ICD-10-CM | POA: Diagnosis not present

## 2022-03-12 DIAGNOSIS — I4891 Unspecified atrial fibrillation: Secondary | ICD-10-CM | POA: Diagnosis not present

## 2022-03-12 DIAGNOSIS — R52 Pain, unspecified: Secondary | ICD-10-CM | POA: Diagnosis not present

## 2022-03-12 DIAGNOSIS — N189 Chronic kidney disease, unspecified: Secondary | ICD-10-CM | POA: Diagnosis not present

## 2022-03-12 DIAGNOSIS — Z7984 Long term (current) use of oral hypoglycemic drugs: Secondary | ICD-10-CM | POA: Diagnosis not present

## 2022-03-12 DIAGNOSIS — N183 Chronic kidney disease, stage 3 unspecified: Secondary | ICD-10-CM | POA: Diagnosis not present

## 2022-03-12 DIAGNOSIS — I130 Hypertensive heart and chronic kidney disease with heart failure and stage 1 through stage 4 chronic kidney disease, or unspecified chronic kidney disease: Secondary | ICD-10-CM | POA: Diagnosis not present

## 2022-03-12 DIAGNOSIS — E119 Type 2 diabetes mellitus without complications: Secondary | ICD-10-CM | POA: Diagnosis not present

## 2022-03-12 DIAGNOSIS — I10 Essential (primary) hypertension: Secondary | ICD-10-CM | POA: Diagnosis not present

## 2022-03-12 DIAGNOSIS — I480 Paroxysmal atrial fibrillation: Secondary | ICD-10-CM | POA: Diagnosis not present

## 2022-03-12 DIAGNOSIS — K219 Gastro-esophageal reflux disease without esophagitis: Secondary | ICD-10-CM | POA: Diagnosis not present

## 2022-03-12 DIAGNOSIS — R21 Rash and other nonspecific skin eruption: Secondary | ICD-10-CM | POA: Diagnosis not present

## 2022-03-12 DIAGNOSIS — R42 Dizziness and giddiness: Secondary | ICD-10-CM | POA: Diagnosis not present

## 2022-03-12 DIAGNOSIS — E569 Vitamin deficiency, unspecified: Secondary | ICD-10-CM | POA: Diagnosis not present

## 2022-03-12 DIAGNOSIS — I5033 Acute on chronic diastolic (congestive) heart failure: Secondary | ICD-10-CM | POA: Diagnosis not present

## 2022-03-12 DIAGNOSIS — J9601 Acute respiratory failure with hypoxia: Secondary | ICD-10-CM | POA: Diagnosis not present

## 2022-03-12 DIAGNOSIS — R5383 Other fatigue: Secondary | ICD-10-CM | POA: Diagnosis not present

## 2022-03-12 DIAGNOSIS — M6259 Muscle wasting and atrophy, not elsewhere classified, multiple sites: Secondary | ICD-10-CM | POA: Diagnosis not present

## 2022-03-12 DIAGNOSIS — H04129 Dry eye syndrome of unspecified lacrimal gland: Secondary | ICD-10-CM | POA: Diagnosis not present

## 2022-03-12 DIAGNOSIS — R062 Wheezing: Secondary | ICD-10-CM | POA: Diagnosis not present

## 2022-03-12 DIAGNOSIS — E785 Hyperlipidemia, unspecified: Secondary | ICD-10-CM | POA: Diagnosis not present

## 2022-03-12 DIAGNOSIS — J9600 Acute respiratory failure, unspecified whether with hypoxia or hypercapnia: Secondary | ICD-10-CM | POA: Diagnosis not present

## 2022-03-12 LAB — BASIC METABOLIC PANEL
Anion gap: 9 (ref 5–15)
BUN: 33 mg/dL — ABNORMAL HIGH (ref 8–23)
CO2: 29 mmol/L (ref 22–32)
Calcium: 8.7 mg/dL — ABNORMAL LOW (ref 8.9–10.3)
Chloride: 100 mmol/L (ref 98–111)
Creatinine, Ser: 1.07 mg/dL — ABNORMAL HIGH (ref 0.44–1.00)
GFR, Estimated: 50 mL/min — ABNORMAL LOW (ref >60)
Glucose, Bld: 161 mg/dL — ABNORMAL HIGH (ref 70–99)
Potassium: 3.9 mmol/L (ref 3.5–5.1)
Sodium: 138 mmol/L (ref 135–145)

## 2022-03-12 LAB — GLUCOSE, CAPILLARY
Glucose-Capillary: 163 mg/dL — ABNORMAL HIGH (ref 70–99)
Glucose-Capillary: 165 mg/dL — ABNORMAL HIGH (ref 70–99)
Glucose-Capillary: 224 mg/dL — ABNORMAL HIGH (ref 70–99)

## 2022-03-12 LAB — MAGNESIUM: Magnesium: 2.3 mg/dL (ref 1.7–2.4)

## 2022-03-12 MED ORDER — APIXABAN 5 MG PO TABS: 5.0000 mg | ORAL_TABLET | Freq: Two times a day (BID) | ORAL | 0 refills | Status: DC

## 2022-03-12 MED ORDER — METOPROLOL TARTRATE 100 MG PO TABS: 100.0000 mg | ORAL_TABLET | Freq: Two times a day (BID) | ORAL | 0 refills | Status: DC

## 2022-03-12 MED ORDER — FUROSEMIDE 40 MG PO TABS: 40.0000 mg | ORAL_TABLET | Freq: Every day | ORAL | 0 refills | Status: DC

## 2022-03-12 MED ORDER — DILTIAZEM HCL ER COATED BEADS 240 MG PO CP24: 240.0000 mg | ORAL_CAPSULE | Freq: Every day | ORAL | 0 refills | Status: DC

## 2022-03-12 MED ORDER — CARBOXYMETHYLCELLULOSE SOD PF 0.5 % OP SOLN: 1.0000 [drp] | Freq: Four times a day (QID) | OPHTHALMIC | Status: DC | PRN

## 2022-03-12 NOTE — Discharge Summary (Signed)
Physician Discharge Summary  Victoria Dalton BTD:[id number] DOB: 10-Dec-1930 DOA: 03/08/2022  PCP: Cletis Athens, MD  Admit date: 03/08/2022 Discharge date: 03/12/2022  Admitted From: home  Disposition:  SNF  Recommendations for Outpatient Follow-up:  Follow up with PCP in 1-2 weeks F/u w/ cardio, Dr. Clayborn Bigness or Dr. Nehemiah Massed, in 1-2 weeks  Home Health: no  Equipment/Devices:  Discharge Condition: stable  CODE STATUS: DNR  Diet recommendation: Heart Healthy    Brief/Interim Summary: HPI was taken from Dr. Charleen Kirks: Victoria Dalton is a 88 y.o. female with medical history significant of hypertension, type 2 diabetes, frequent UTI who presents to the ED with complaints of as of breath.   Victoria Dalton states she began to develop dyspnea on exertion approximately 2 weeks ago that has gradually progressed and become severe.  She noticed that she is unable to walk even a few steps around her home at this time without feeling dyspneic.  She denies any orthopnea at this time but endorses bendopnea.  She has noticed during this time increased lower extremity swelling.  For 1 week, she had increased urinary frequency, but her urinary frequency has decreased these past 7 days.  She denies any chest pain, palpitations.   Victoria Dalton denies any recent illnesses.  She denies any prior history of heart failure.  Other symptoms she endorses at this time is mild urinary burning, but otherwise denies fever, chills, nausea, vomiting, diarrhea, abdominal pain, bowel movement changes, focal weakness.   ED course: On arrival to the ED, patient was tachycardic up to 116 with a blood pressure of 124/90.  Due to desaturation below 90%, she was placed on 2 L of supplemental oxygen.  EKG remarkable for atrial fibrillation with RVR.  Initial blood work remarkable for glucose of 212, creatinine of 1.26, BNP of 495.  Troponin negative x2.  Chest x-ray with evidence of pulmonary edema and bilateral pleural  effusions.  TRH consulted for admission for new onset A-fib with RVR and heart failure.  As per Dr. Mal Misty: Victoria Dalton is a 88 y.o. female with medical history significant for hypertension, hyperlipidemia, diabetes mellitus, history of UTIs, macular degeneration, kidney stones, hypertensive retinopathy, who presented to the hospital with shortness of breath.  This started about 2 weeks ago and has progressively gotten worse. She also noticed increasing lower extremity swelling in the preceding week prior to admission.     She was found to have atrial fibrillation with RVR, acute congestive heart failure complicated by acute hypoxic respiratory failure.   As per Dr. Jimmye Norman 03/12/22: Victoria Dalton was medically stable to d/c to SNF. Victoria Dalton will d/c home on diltiazem, metoprolol tartrate and eliquis for a. fib. Victoria Dalton will need to f/u outpatient w/ cardio, Dr. Clayborn Bigness or Dr. Nehemiah Massed, in 1-2 weeks. For more information, please see previous progress/consult notes.   Discharge Diagnoses:  Principal Problem:   Atrial fibrillation with RVR (HCC) Active Problems:   Acute on chronic diastolic CHF (congestive heart failure) (HCC)   Acute respiratory failure with hypoxia (HCC)   Chronic kidney disease (CKD), stage III (moderate) (HCC)   Benign essential HTN   Controlled type 2 diabetes mellitus without complication (HCC)   Dyslipidemia  Acute on chronic diastolic CHF: continue lasix.  No evidence of reversible ischemia on nuclear stress test.  Monitor I/Os. Echo showed EF 50 to 55%, moderate concentric LVH, grade 1 diastolic dysfunction, moderate mitral regurgitation.   A. fib: new onset. With RVR. Continue on metoprolol, cardizem, eliquis. CHA2DS2-VASc score is  5. Cardio following and recs apprec   Acute hypoxic respiratory failure: resolved   CKDIIIa: Cr is labile.   Generalized weakness: Victoria Dalton/OT recs SNF  DM2: poorly controlled, HbA1c 8.1. Continue on home dose of anti-DM meds   Discharge  Instructions  Discharge Instructions     Diet - low sodium heart healthy   Complete by: As directed    Diet Carb Modified   Complete by: As directed    Discharge instructions   Complete by: As directed    F/u w/ PCP in 1-2 weeks. F/u w/ cardio, Dr. Nehemiah Massed, in 1-2 weeks   Increase activity slowly   Complete by: As directed       Allergies as of 03/12/2022       Reactions   Chlorhexidine Itching   Ramipril Other (See Comments)   IRREGULAR HEART BEAT   Penicillins Rash   Has patient had a PCN reaction causing immediate rash, facial/tongue/throat swelling, SOB or lightheadedness with hypotension: Yes Has patient had a PCN reaction causing severe rash involving mucus membranes or skin necrosis: Yes Has patient had a PCN reaction that required hospitalization No Has patient had a PCN reaction occurring within the last 10 years: No If all of the above answers are "NO", then may proceed with Cephalosporin use.   Poison Oak Extract Rash   Sulfa Antibiotics Rash        Medication List     STOP taking these medications    aspirin EC 81 MG tablet   azithromycin 250 MG tablet Commonly known as: Zithromax Z-Pak   AZO TABS PO   cefdinir 300 MG capsule Commonly known as: OMNICEF   metoprolol succinate 100 MG 24 hr tablet Commonly known as: TOPROL-XL   nystatin-triamcinolone ointment Commonly known as: MYCOLOG   omeprazole 20 MG capsule Commonly known as: PRILOSEC   phenazopyridine 95 MG tablet Commonly known as: PYRIDIUM       TAKE these medications    acetaminophen 500 MG tablet Commonly known as: TYLENOL Take 500 mg by mouth every 6 (six) hours as needed for mild pain.   albuterol 108 (90 Base) MCG/ACT inhaler Commonly known as: Ventolin HFA Inhale 2 puffs into the lungs every 6 (six) hours as needed for wheezing or shortness of breath.   apixaban 5 MG Tabs tablet Commonly known as: ELIQUIS Take 1 tablet (5 mg total) by mouth 2 (two) times daily.    calcium carbonate 1250 (500 Ca) MG chewable tablet Commonly known as: OS-CAL Chew by mouth.   carboxymethylcellulose 0.5 % Soln Commonly known as: REFRESH PLUS 1 drop 3 (three) times daily as needed.   clotrimazole-betamethasone cream Commonly known as: LOTRISONE Apply topically in the morning and at bedtime.   conjugated estrogens vaginal cream Commonly known as: PREMARIN Place 1 Applicatorful vaginally 2 (two) times a week. 1 gram vaginally at bedtime twice weekly   diltiazem 240 MG 24 hr capsule Commonly known as: CARDIZEM CD Take 1 capsule (240 mg total) by mouth daily. Start taking on: March 13, 2022   furosemide 40 MG tablet Commonly known as: Lasix Take 1 tablet (40 mg total) by mouth daily.   glimepiride 4 MG tablet Commonly known as: AMARYL TAKE 1 TABLET(4 MG) BY MOUTH DAILY BEFORE BREAKFAST   ketoconazole 2 % cream Commonly known as: NIZORAL Apply to skin folds once to twice daily for rash.   losartan 100 MG tablet Commonly known as: COZAAR TAKE 1 TABLET BY MOUTH DAILY   metoprolol tartrate 100 MG  tablet Commonly known as: LOPRESSOR Take 1 tablet (100 mg total) by mouth 2 (two) times daily.   ondansetron 4 MG disintegrating tablet Commonly known as: Zofran ODT Take 1 tablet (4 mg total) by mouth every 8 (eight) hours as needed for nausea or vomiting.   ONE TOUCH ULTRA TEST test strip Generic drug: glucose blood   pantoprazole 40 MG tablet Commonly known as: PROTONIX TAKE 1 TABLET(40 MG) BY MOUTH DAILY   PRESERVISION AREDS 2 PO Take by mouth.   simvastatin 40 MG tablet Commonly known as: ZOCOR Take 1 tablet (40 mg total) by mouth daily.        Contact information for follow-up providers     Cletis Athens, MD. Go in 1 week(s).   Specialties: Internal Medicine, Cardiology Contact information: [place] [address] [phone]         Cletis Athens, MD .   Specialties: Internal Medicine, Cardiology Contact  information: [address] [address] [phone]              Contact information for after-discharge care     Destination     [facility] Preferred SNF .   Service: Skilled Nursing Contact information: [place] [address] [phone]                    Allergies  Allergen Reactions   Chlorhexidine Itching   Ramipril Other (See Comments)    IRREGULAR HEART BEAT   Penicillins Rash    Has patient had a PCN reaction causing immediate rash, facial/tongue/throat swelling, SOB or lightheadedness with hypotension: Yes Has patient had a PCN reaction causing severe rash involving mucus membranes or skin necrosis: Yes Has patient had a PCN reaction that required hospitalization No Has patient had a PCN reaction occurring within the last 10 years: No If all of the above answers are "NO", then may proceed with Cephalosporin use.    Poison Oak Extract Rash   Sulfa Antibiotics Rash    Consultations: Cardio    Procedures/Studies: NM Myocar Multi W/Spect W/Wall Motion / EF  Result Date: 03/11/2022   The study is normal. The study is low risk.   No ST deviation was noted.   LV perfusion is normal. There is no evidence of ischemia. There is no evidence of infarction.   Left ventricular function is normal. End diastolic cavity size is normal.   Prior study not available for comparison.   ECHOCARDIOGRAM COMPLETE  Result Date: 03/09/2022    ECHOCARDIOGRAM REPORT   Patient Name:   LOUISA FAVARO Date of Exam: 03/09/2022 Medical Rec #:  [id number]         Height:       69.0 in Accession #:    [id number]        Weight:       271.0 lb Date of Birth:  1931/01/03         BSA:          2.351 m Patient Age:    88 years          BP:           124/95 mmHg Patient Gender: F                 HR:           112 bpm. Exam Location:  [facility] Procedure: 2D Echo Indications:     CHF I50.9  Atrial Fibrillation  I48.91  History:         Patient has no prior history of Echocardiogram examinations.  Sonographer:     Kathlen Brunswick RDCS Referring Phys:  [id number] Jose Persia Diagnosing Phys: Yolonda Kida MD IMPRESSIONS  1. Left ventricular ejection fraction, by estimation, is 50 to 55%. The left ventricle has low normal function. The left ventricle has no regional wall motion abnormalities. There is moderate concentric left ventricular hypertrophy. Left ventricular diastolic parameters are consistent with Grade I diastolic dysfunction (impaired relaxation).  2. Right ventricular systolic function is normal. The right ventricular size is normal.  3. The mitral valve is normal in structure. Moderate mitral valve regurgitation.  4. The aortic valve is normal in structure. Aortic valve regurgitation is not visualized. FINDINGS  Left Ventricle: Left ventricular ejection fraction, by estimation, is 50 to 55%. The left ventricle has low normal function. The left ventricle has no regional wall motion abnormalities. The left ventricular internal cavity size was normal in size. There is moderate concentric left ventricular hypertrophy. Left ventricular diastolic parameters are consistent with Grade I diastolic dysfunction (impaired relaxation). Right Ventricle: The right ventricular size is normal. No increase in right ventricular wall thickness. Right ventricular systolic function is normal. Left Atrium: Left atrial size was normal in size. Right Atrium: Right atrial size was normal in size. Pericardium: There is no evidence of pericardial effusion. Mitral Valve: The mitral valve is normal in structure. Moderate mitral valve regurgitation. Tricuspid Valve: The tricuspid valve is normal in structure. Tricuspid valve regurgitation is mild. Aortic Valve: The aortic valve is normal in structure. Aortic valve regurgitation is not visualized. Aortic valve peak gradient measures 13.6 mmHg. Pulmonic Valve: The pulmonic valve was normal  in structure. Pulmonic valve regurgitation is not visualized. Aorta: The ascending aorta was not well visualized. IAS/Shunts: No atrial level shunt detected by color flow Doppler.  LEFT VENTRICLE PLAX 2D LVIDd:         4.30 cm     Diastology LVIDs:         3.30 cm     LV e' medial:    8.59 cm/s LV PW:         1.50 cm     LV E/e' medial:  14.6 LV IVS:        1.30 cm     LV e' lateral:   10.40 cm/s LVOT diam:     2.00 cm     LV E/e' lateral: 12.0 LV SV:         47 LV SV Index:   20 LVOT Area:     3.14 cm  LV Volumes (MOD) LV vol d, MOD A2C: 42.4 ml LV vol d, MOD A4C: 40.2 ml LV vol s, MOD A2C: 19.6 ml LV vol s, MOD A4C: 19.3 ml LV SV MOD A2C:     22.8 ml LV SV MOD A4C:     40.2 ml LV SV MOD BP:      21.7 ml RIGHT VENTRICLE RV Basal diam:  2.10 cm RV S prime:     9.46 cm/s TAPSE (M-mode): 1.5 cm LEFT ATRIUM             Index        RIGHT ATRIUM           Index LA diam:        3.50 cm 1.49 cm/m   RA Area:     13.40 cm LA Vol (A2C):   48.6  ml 20.67 ml/m  RA Volume:   29.80 ml  12.68 ml/m LA Vol (A4C):   35.0 ml 14.89 ml/m LA Biplane Vol: 42.1 ml 17.91 ml/m  AORTIC VALVE                 PULMONIC VALVE AV Area (Vmax): 1.64 cm     PV Vmax:       0.85 m/s AV Vmax:        184.50 cm/s  PV Peak grad:  2.9 mmHg AV Peak Grad:   13.6 mmHg LVOT Vmax:      96.44 cm/s LVOT Vmean:     63.520 cm/s LVOT VTI:       0.150 m  AORTA Ao Root diam: 3.10 cm Ao Asc diam:  3.30 cm MITRAL VALVE                  TRICUSPID VALVE MV Area (PHT): 3.50 cm       TV Peak grad:   30.1 mmHg MV Decel Time: 217 msec       TV Vmax:        2.74 m/s MR Peak grad:    116.6 mmHg MR Mean grad:    84.0 mmHg    SHUNTS MR Vmax:         540.00 cm/s  Systemic VTI:  0.15 m MR Vmean:        443.0 cm/s   Systemic Diam: 2.00 cm MR PISA:         1.57 cm MR PISA Eff ROA: 9 mm MR PISA Radius:  0.50 cm MV E velocity: 125.00 cm/s Yolonda Kida MD Electronically signed by Yolonda Kida MD Signature Date/Time: 03/09/2022/2:58:42 PM    Final    DG Chest 2  View  Result Date: 03/08/2022 CLINICAL DATA:  Chest pain and shortness of breath. EXAM: CHEST - 2 VIEW COMPARISON:  Earlier sameDay. FINDINGS: Low lung volumes. Bibasilar collapse/consolidation is associated with small bilateral pleural effusions. Interstitial markings are diffusely coarsened with chronic features. The visualized bony structures of the thorax are unremarkable. Telemetry leads overlie the chest. IMPRESSION: Low volume film with bibasilar collapse/consolidation and small bilateral pleural effusions. Electronically Signed   By: Misty Stanley M.D.   On: 03/08/2022 11:59   DG Chest 2 View  Result Date: 03/08/2022 CLINICAL DATA:  Shortness of breath EXAM: CHEST - 2 VIEW COMPARISON:  10/14/2015 FINDINGS: Heart size is mildly enlarged. Aortic atherosclerosis. Small right pleural effusion. Streaky bibasilar opacities, right greater than left. No pneumothorax. IMPRESSION: 1. Small right pleural effusion. 2. Streaky bibasilar opacities, right greater than left, may reflect atelectasis and/or pneumonia. Electronically Signed   By: Davina Poke D.O.   On: 03/08/2022 10:14   (Echo, Carotid, EGD, Colonoscopy, ERCP)    Subjective: Victoria Dalton c/o fatigue    Discharge Exam: Vitals:   03/12/22 1159 03/12/22 1236  BP: (!) 98/52 90/69  Pulse: 80 88  Resp: 18 18  Temp: 98 F (36.7 C) (!) 97.5 F (36.4 C)  SpO2: 94% 96%   Vitals:   03/12/22 0500 03/12/22 0833 03/12/22 1159 03/12/22 1236  BP:  (!) 121/100 (!) 98/52 90/69  Pulse:  61 80 88  Resp:  '18 18 18  '$ Temp:  98.3 F (36.8 C) 98 F (36.7 C) (!) 97.5 F (36.4 C)  TempSrc:   Oral   SpO2:  93% 94% 96%  Weight: 121.8 kg       General: Victoria Dalton is alert, awake, not in acute  distress Cardiovascular: irregularly irregular, no rubs, no gallops Respiratory: decreased breath sounds b/l otherwise clear  Abdominal: Soft, NT, obese, bowel sounds + Extremities:  no cyanosis    The results of significant diagnostics from this hospitalization  (including imaging, microbiology, ancillary and laboratory) are listed below for reference.     Microbiology: Recent Results (from the past 240 hour(s))  Resp Panel by RT-PCR (Flu A&B, Covid) Anterior Nasal Swab     Status: None   Collection Time: 03/08/22 12:02 PM   Specimen: Anterior Nasal Swab  Result Value Ref Range Status   SARS Coronavirus 2 by RT PCR NEGATIVE NEGATIVE Final    Comment: (NOTE) SARS-CoV-2 target nucleic acids are NOT DETECTED.  The SARS-CoV-2 RNA is generally detectable in upper respiratory specimens during the acute phase of infection. The lowest concentration of SARS-CoV-2 viral copies this assay can detect is 138 copies/mL. A negative result does not preclude SARS-Cov-2 infection and should not be used as the sole basis for treatment or other patient management decisions. A negative result may occur with  improper specimen collection/handling, submission of specimen other than nasopharyngeal swab, presence of viral mutation(s) within the areas targeted by this assay, and inadequate number of viral copies(<138 copies/mL). A negative result must be combined with clinical observations, patient history, and epidemiological information. The expected result is Negative.  Fact Sheet for Patients:  EntrepreneurPulse.com.au  Fact Sheet for Healthcare Providers:  IncredibleEmployment.be  This test is no t yet approved or cleared by the [place] FDA and  has been authorized for detection and/or diagnosis of SARS-CoV-2 by FDA under an Emergency Use Authorization (EUA). This EUA will remain  in effect (meaning this test can be used) for the duration of the COVID-19 declaration under Section 564(b)(1) of the Act, 21 U.S.C.section 360bbb-3(b)(1), unless the authorization is terminated  or revoked sooner.       Influenza A by PCR NEGATIVE NEGATIVE Final   Influenza B by PCR NEGATIVE NEGATIVE Final    Comment: (NOTE) The  Xpert Xpress SARS-CoV-2/FLU/RSV plus assay is intended as an aid in the diagnosis of influenza from Nasopharyngeal swab specimens and should not be used as a sole basis for treatment. Nasal washings and aspirates are unacceptable for Xpert Xpress SARS-CoV-2/FLU/RSV testing.  Fact Sheet for Patients: EntrepreneurPulse.com.au  Fact Sheet for Healthcare Providers: IncredibleEmployment.be  This test is not yet approved or cleared by the [place] FDA and has been authorized for detection and/or diagnosis of SARS-CoV-2 by FDA under an Emergency Use Authorization (EUA). This EUA will remain in effect (meaning this test can be used) for the duration of the COVID-19 declaration under Section 564(b)(1) of the Act, 21 U.S.C. section 360bbb-3(b)(1), unless the authorization is terminated or revoked.  Performed at [facility], [address] [place], [place] [address]      Labs: BNP (last 3 results) Recent Labs    03/08/22 1124  BNP 469.6*   Basic Metabolic Panel: Recent Labs  Lab 03/08/22 1124 03/09/22 0515 03/10/22 0514 03/11/22 0430 03/12/22 0445  NA 138 139 136 136 138  K 4.4 4.1 4.1 4.0 3.9  CL 107 101 98 98 100  CO2 '23 27 27 28 29  '$ GLUCOSE 212* 162* 184* 163* 161*  BUN '23 20 23 '$ 25* 33*  CREATININE 1.26* 1.14* 1.25* 1.18* 1.07*  CALCIUM 9.0 9.2 8.8* 8.9 8.7*  MG  --  2.1 2.1 2.1 2.3   Liver Function Tests: Recent Labs  Lab 03/08/22 1124  AST 32  ALT 36  ALKPHOS 113  BILITOT 1.1  PROT 6.7  ALBUMIN 3.8   No results for input(s): "LIPASE", "AMYLASE" in the last 168 hours. No results for input(s): "AMMONIA" in the last 168 hours. CBC: Recent Labs  Lab 03/08/22 1124 03/09/22 0515 03/10/22 0514  WBC 8.9 11.4* 9.9  NEUTROABS 7.0  --   --   HGB 12.1 13.0 12.4  HCT 37.2 40.3 37.6  MCV 94.2 94.6 92.2  PLT 192 212 213   Cardiac Enzymes: No results for input(s): "CKTOTAL", "CKMB", "CKMBINDEX", "TROPONINI" in  the last 168 hours. BNP: Invalid input(s): "POCBNP" CBG: Recent Labs  Lab 03/11/22 1627 03/11/22 2151 03/12/22 0606 03/12/22 0736 03/12/22 1149  GLUCAP 166* 217* 163* 165* 224*   D-Dimer No results for input(s): "DDIMER" in the last 72 hours. Hgb A1c No results for input(s): "HGBA1C" in the last 72 hours. Lipid Profile No results for input(s): "CHOL", "HDL", "LDLCALC", "TRIG", "CHOLHDL", "LDLDIRECT" in the last 72 hours. Thyroid function studies No results for input(s): "TSH", "T4TOTAL", "T3FREE", "THYROIDAB" in the last 72 hours.  Invalid input(s): "FREET3" Anemia work up No results for input(s): "VITAMINB12", "FOLATE", "FERRITIN", "TIBC", "IRON", "RETICCTPCT" in the last 72 hours. Urinalysis    Component Value Date/Time   COLORURINE STRAW (A) 03/08/2022 1924   APPEARANCEUR CLEAR (A) 03/08/2022 1924   APPEARANCEUR Clear 11/18/2019 1359   LABSPEC 1.004 (L) 03/08/2022 1924   LABSPEC 1.011 08/30/2013 0053   PHURINE 5.0 03/08/2022 1924   GLUCOSEU NEGATIVE 03/08/2022 1924   GLUCOSEU see comment 08/30/2013 0053   HGBUR NEGATIVE 03/08/2022 1924   BILIRUBINUR NEGATIVE 03/08/2022 1924   BILIRUBINUR negative 02/07/2021 1828   BILIRUBINUR Negative 11/18/2019 1359   BILIRUBINUR see comment 08/30/2013 [place] 03/08/2022 1924   PROTEINUR NEGATIVE 03/08/2022 1924   UROBILINOGEN 0.2 02/07/2021 1828   NITRITE NEGATIVE 03/08/2022 1924   LEUKOCYTESUR NEGATIVE 03/08/2022 1924   LEUKOCYTESUR see comment 08/30/2013 0053   Sepsis Labs Recent Labs  Lab 03/08/22 1124 03/09/22 0515 03/10/22 0514  WBC 8.9 11.4* 9.9   Microbiology Recent Results (from the past 240 hour(s))  Resp Panel by RT-PCR (Flu A&B, Covid) Anterior Nasal Swab     Status: None   Collection Time: 03/08/22 12:02 PM   Specimen: Anterior Nasal Swab  Result Value Ref Range Status   SARS Coronavirus 2 by RT PCR NEGATIVE NEGATIVE Final    Comment: (NOTE) SARS-CoV-2 target nucleic acids are NOT  DETECTED.  The SARS-CoV-2 RNA is generally detectable in upper respiratory specimens during the acute phase of infection. The lowest concentration of SARS-CoV-2 viral copies this assay can detect is 138 copies/mL. A negative result does not preclude SARS-Cov-2 infection and should not be used as the sole basis for treatment or other patient management decisions. A negative result may occur with  improper specimen collection/handling, submission of specimen other than nasopharyngeal swab, presence of viral mutation(s) within the areas targeted by this assay, and inadequate number of viral copies(<138 copies/mL). A negative result must be combined with clinical observations, patient history, and epidemiological information. The expected result is Negative.  Fact Sheet for Patients:  EntrepreneurPulse.com.au  Fact Sheet for Healthcare Providers:  IncredibleEmployment.be  This test is no t yet approved or cleared by the [place] FDA and  has been authorized for detection and/or diagnosis of SARS-CoV-2 by FDA under an Emergency Use Authorization (EUA). This EUA will remain  in effect (meaning this test can be used) for the duration of the COVID-19 declaration  under Section 564(b)(1) of the Act, 21 U.S.C.section 360bbb-3(b)(1), unless the authorization is terminated  or revoked sooner.       Influenza A by PCR NEGATIVE NEGATIVE Final   Influenza B by PCR NEGATIVE NEGATIVE Final    Comment: (NOTE) The Xpert Xpress SARS-CoV-2/FLU/RSV plus assay is intended as an aid in the diagnosis of influenza from Nasopharyngeal swab specimens and should not be used as a sole basis for treatment. Nasal washings and aspirates are unacceptable for Xpert Xpress SARS-CoV-2/FLU/RSV testing.  Fact Sheet for Patients: EntrepreneurPulse.com.au  Fact Sheet for Healthcare Providers: IncredibleEmployment.be  This test is not yet  approved or cleared by the [place] FDA and has been authorized for detection and/or diagnosis of SARS-CoV-2 by FDA under an Emergency Use Authorization (EUA). This EUA will remain in effect (meaning this test can be used) for the duration of the COVID-19 declaration under Section 564(b)(1) of the Act, 21 U.S.C. section 360bbb-3(b)(1), unless the authorization is terminated or revoked.  Performed at [facility], [address] [place], [place] [address]      Time coordinating discharge: Over 30 minutes  SIGNED:   Wyvonnia Dusky, MD  [facility] 03/12/2022, 1:24 PM Pager   If 7PM-7AM, please contact night-coverage [email or web address]

## 2022-03-12 NOTE — Progress Notes (Signed)
   Heart Failure Nurse Navigator Note  Met with patient's daughter today.  Went over diet changes, discussed what the patient had in her freezer and cupboard.  Daughter states that her mother realizes the changes that need to be made in her diet, and getting rid of the convenience foods is needed.   Also discussed fluid restriction.  She had no further questions.  Pricilla Riffle RN [facility]

## 2022-03-12 NOTE — Care Management Important Message (Signed)
Important Message  Patient Details  Name: Victoria Dalton MRN: [id number] Date of Birth: [birth year]   Medicare Important Message Given:  Yes     Dannette Barbara 03/12/2022, 11:22 AM

## 2022-03-12 NOTE — TOC Transition Note (Signed)
Transition of Care [facility]) - CM/SW Discharge Note   Patient Details  Name: Victoria Dalton MRN: [id number] Date of Birth: 15-Nov-1930  Transition of Care [facility]) CM/SW Contact:  Candie Chroman, LCSW Phone Number: 03/12/2022, 3:15 PM   Clinical Narrative:  Patient has orders to discharge to [facility] today. RN will call report to [phone] (Room 712). Daughter will transport by car. No further concerns. CSW signing off.   Final next level of care: Skilled Nursing Facility Barriers to Discharge: Barriers Resolved   Patient Goals and CMS Choice   CMS Medicare.gov Compare Post Acute Care list provided to:: Patient Represenative (must comment) (Daughter) Choice offered to / list presented to : Patient, Adult Children  Discharge Placement PASRR number recieved: 03/11/22            Patient chooses bed at: [facility] [place] Patient to be transferred to facility by: Daughter Name of family member notified: [name] Patient and family notified of of transfer: 03/12/22  Discharge Plan and Services     Post Acute Care Choice:  (TBD)                               Social Determinants of Health (SDOH) Interventions     Readmission Risk Interventions     No data to display

## 2022-03-12 NOTE — TOC Progression Note (Addendum)
Transition of Care [facility]) - Progression Note    Patient Details  Name: Victoria Dalton MRN: [id number] Date of Birth: Aug 29, 1930  Transition of Care [facility]) CM/SW [place], LCSW Phone Number: 03/12/2022, 10:16 AM  Clinical Narrative: Notified patient and daughter late yesterday that [facility] had offered a bed. Per admissions coordinator, they will have a bed today if stable for discharge and auth approved. MD will update once she sees her. Started Ship broker.    11:22 pm: Per PT, patient does not meet medical necessity for EMS transport to SNF. Daughter will transport.  1:04 pm: Auth approved. Auth number hasn't generated yet but SNF said MD can start working on discharge. Reference # [id number]. Valid 10/18-10/20.  Expected Discharge Plan:  (TBD) Barriers to Discharge: Continued Medical Work up  Expected Discharge Plan and Services Expected Discharge Plan:  (TBD)     Post Acute Care Choice:  (TBD) Living arrangements for the past 2 months: Single Family Home                                       Social Determinants of Health (SDOH) Interventions    Readmission Risk Interventions     No data to display

## 2022-03-12 NOTE — Progress Notes (Signed)
Report called to [facility], given to nurse Caryl Pina. AVS provided to patient and daughter. IV removed. Daughter will transport patient to [facility].

## 2022-03-12 NOTE — Progress Notes (Signed)
Occupational Therapy Treatment Patient Details Name: Victoria Dalton MRN: [id number] DOB: 16-Apr-1931 Today's Date: 03/12/2022   History of present illness Victoria Dalton is a 89 y.o. female with medical history significant for hypertension, hyperlipidemia, diabetes mellitus, history of UTIs, macular degeneration, kidney stones, hypertensive retinopathy, who presented to the hospital with shortness of breath.  This started about 2 weeks ago and has progressively gotten worse.  She also noticed increasing lower extremity swelling in the preceding week prior to admission. She was found to have atrial fibrillation with RVR, acute congestive heart failure complicated by acute hypoxic respiratory failure.   OT comments  Pt seen for OT tx session this date.  Standing activity continues to be limited by tachycardia.  HR in sitting 97-103, and after amb to bathroom for toileting, increased to high 130s.  OT continued to reinforce allowing frequent rest breaks and slow pace with activity.  Performed oral and hair care seated in recliner with set up.  Pt slightly anxious this morning as pt had wet the bed earlier this morning (already cleaned up) and verbalized anxiety with the fact that she had never had HR issues or had to watch her fluid intake before and she's concerned about how she will monitor these things when she returns home.  OT encouraged pt that her care team was working to stabilize her Afib, and that she would have guidance on managing fluid intake needs and monitoring HR as needed before d/c home.  Anticipate d/c to [facility] today.  OT educated on pulse oximetry for monitoring her own HR, but encouraged pt that education would be in place before her d/c from SNF.  Will continue to follow in the acute setting to maximize safety and indep with ADLs.  OT left pt in chair with all needs met and all items within reach, purewick in place.     Recommendations for follow up therapy are one component of a  multi-disciplinary discharge planning process, led by the attending physician.  Recommendations may be updated based on patient status, additional functional criteria and insurance authorization.    Follow Up Recommendations  Skilled nursing-short term rehab (<3 hours/day)    Assistance Recommended at Discharge Frequent or constant Supervision/Assistance  Patient can return home with the following  A little help with walking and/or transfers;Assistance with cooking/housework;A little help with bathing/dressing/bathroom   Equipment Recommendations  Other (comment) (defer to next venue of care)    Recommendations for Other Services      Precautions / Restrictions Precautions Precautions: Fall Precaution Comments: tachycardic Restrictions Weight Bearing Restrictions: No       Mobility Bed Mobility Overal bed mobility: Needs Assistance Bed Mobility: Supine to Sit     Supine to sit: Modified independent (Device/Increase time), HOB elevated       Patient Response: Cooperative, Anxious  Transfers Overall transfer level: Needs assistance Equipment used: Rolling walker (2 wheels) Transfers: Sit to/from Stand Sit to Stand: Supervision           General transfer comment: increased time and effort for sit to stand from bedside, but able to perform with set up of walker and SBA     Balance Overall balance assessment: Needs assistance Sitting-balance support: No upper extremity supported, Feet supported Sitting balance-Leahy Scale: Good Sitting balance - Comments: steady sitting on commode and EOB.  Able to lean posteriorly on EOB for OT to remove purewick, then return to midline unassisted.   Standing balance support: During functional activity, Reliant on assistive device  for balance Standing balance-Leahy Scale: Fair Standing balance comment: Pt was able to stand at commode and lower and hike panties without support from walker                           ADL  either performed or assessed with clinical judgement   ADL Overall ADL's : Needs assistance/impaired Eating/Feeding: Independent   Grooming: Wash/dry hands;Standing;Brushing hair Grooming Details (indicate cue type and reason): washed hands in standing after toileting, transitioned to brushing hair in sitting d/t tachycardic                 Toilet Transfer: Supervision/safety;BSC/3in1;Rolling walker (2 wheels) Toilet Transfer Details (indicate cue type and reason): 3in1 over toilet Toileting- Clothing Manipulation and Hygiene: Supervision/safety;With adaptive equipment;Sit to/from stand Toileting - Clothing Manipulation Details (indicate cue type and reason): RW     Functional mobility during ADLs: Supervision/safety;Rolling walker (2 wheels) General ADL Comments: limited ambulation to bathroom for toileting and back to chair d/t tachycardia    Extremity/Trunk Assessment Upper Extremity Assessment Upper Extremity Assessment: Overall WFL for tasks assessed   Lower Extremity Assessment Lower Extremity Assessment: Generalized weakness   Cervical / Trunk Assessment Cervical / Trunk Assessment: Normal    Vision Baseline Vision/History: 1 Wears glasses Patient Visual Report: No change from baseline                Cognition Arousal/Alertness: Awake/alert Behavior During Therapy: WFL for tasks assessed/performed Overall Cognitive Status: Within Functional Limits for tasks assessed                                          Exercises Other Exercises Other Exercises: Reinforced EC strategies; encouraged seated ADLs d/t tachycardia, perform activities slowly with frequent rest breaks.    Shoulder Instructions       General Comments slightly anxious as pt had wet the bed earlier this morning. anxiety with the fact that she has never had HR issues or had to watch her fluid intake before and she's concerned about how she will monitor these things when she  returns home.    Pertinent Vitals/ Pain       Pain Assessment Pain Assessment: No/denies pain                                            Prior Functioning/Environment              Frequency  Min 2X/week        Progress Toward Goals  OT Goals(current goals can now be found in the care plan section)  Progress towards OT goals: Progressing toward goals  Acute Rehab OT Goals Patient Stated Goal: get stronger OT Goal Formulation: With patient/family Time For Goal Achievement: 03/22/22 Potential to Achieve Goals: Good  Plan Discharge plan remains appropriate                    AM-PAC OT "6 Clicks" Daily Activity     Outcome Measure   Help from another person eating meals?: None Help from another person taking care of personal grooming?: A Little Help from another person toileting, which includes using toliet, bedpan, or urinal?: A Little Help from another person bathing (including washing, rinsing, drying)?:  A Little Help from another person to put on and taking off regular upper body clothing?: A Little Help from another person to put on and taking off regular lower body clothing?: A Lot 6 Click Score: 18    End of Session Equipment Utilized During Treatment: Rolling walker (2 wheels)  OT Visit Diagnosis: Other abnormalities of gait and mobility (R26.89);Muscle weakness (generalized) (M62.81)   Activity Tolerance Other (comment) (continues to be limited by tachycardia)   Patient Left in chair;with call bell/phone within reach   Nurse Communication          Time: [phone] OT Time Calculation (min): 22 min  Charges: OT General Charges $OT Visit: 1 Visit OT Treatments $Self Care/Home Management : 8-22 mins  Leta Speller, MS, OTR/L   Darleene Cleaver 03/12/2022, 8:30 AM

## 2022-03-12 NOTE — Inpatient Diabetes Management (Signed)
Inpatient Diabetes Program Recommendations  AACE/ADA: New Consensus Statement on Inpatient Glycemic Control   Target Ranges:  Prepandial:   less than 140 mg/dL      [facility] postprandial:   less than 180 mg/dL (1-2 hours)      Critically ill patients:  140 - 180 mg/dL    Latest Reference Range & Units 03/12/22 06:06 03/12/22 07:36 03/12/22 11:49  Glucose-Capillary 70 - 99 mg/dL 163 (H) 165 (H) 224 (H)    Latest Reference Range & Units 03/11/22 07:27 03/11/22 11:20 03/11/22 16:27 03/11/22 21:51  Glucose-Capillary 70 - 99 mg/dL 181 (H) 206 (H) 166 (H) 217 (H)   Review of Glycemic Control  Diabetes history: DM2 Outpatient Diabetes medications: Amaryl 4 mg daily Current orders for Inpatient glycemic control: Novolog 0-15 units TID with meals  Inpatient Diabetes Program Recommendations:    Insulin: Please consider ordering Novolog 3 units TID with meals for meal coverage if patient eats at least 50% of meals.  Thanks, Barnie Alderman, RN, MSN, [place] Diabetes Coordinator Inpatient Diabetes Program [phone] (Team Pager from 8am to [place])

## 2022-03-13 DIAGNOSIS — R062 Wheezing: Secondary | ICD-10-CM | POA: Diagnosis not present

## 2022-03-13 DIAGNOSIS — I4891 Unspecified atrial fibrillation: Secondary | ICD-10-CM | POA: Diagnosis not present

## 2022-03-13 DIAGNOSIS — I5033 Acute on chronic diastolic (congestive) heart failure: Secondary | ICD-10-CM | POA: Diagnosis not present

## 2022-03-13 DIAGNOSIS — R52 Pain, unspecified: Secondary | ICD-10-CM | POA: Diagnosis not present

## 2022-03-17 DIAGNOSIS — I10 Essential (primary) hypertension: Secondary | ICD-10-CM | POA: Diagnosis not present

## 2022-03-17 DIAGNOSIS — J9600 Acute respiratory failure, unspecified whether with hypoxia or hypercapnia: Secondary | ICD-10-CM | POA: Diagnosis not present

## 2022-03-17 DIAGNOSIS — I4891 Unspecified atrial fibrillation: Secondary | ICD-10-CM | POA: Diagnosis not present

## 2022-03-17 DIAGNOSIS — I5033 Acute on chronic diastolic (congestive) heart failure: Secondary | ICD-10-CM | POA: Diagnosis not present

## 2022-03-19 ENCOUNTER — Ambulatory Visit: Payer: Medicare Other | Attending: Family | Admitting: Family

## 2022-03-19 ENCOUNTER — Encounter: Payer: Self-pay | Admitting: Family

## 2022-03-19 VITALS — BP 106/58 | HR 65 | Resp 16 | Ht 69.0 in | Wt 267.2 lb

## 2022-03-19 DIAGNOSIS — Z7984 Long term (current) use of oral hypoglycemic drugs: Secondary | ICD-10-CM | POA: Insufficient documentation

## 2022-03-19 DIAGNOSIS — N189 Chronic kidney disease, unspecified: Secondary | ICD-10-CM | POA: Insufficient documentation

## 2022-03-19 DIAGNOSIS — I4891 Unspecified atrial fibrillation: Secondary | ICD-10-CM | POA: Insufficient documentation

## 2022-03-19 DIAGNOSIS — I130 Hypertensive heart and chronic kidney disease with heart failure and stage 1 through stage 4 chronic kidney disease, or unspecified chronic kidney disease: Secondary | ICD-10-CM | POA: Insufficient documentation

## 2022-03-19 DIAGNOSIS — E1122 Type 2 diabetes mellitus with diabetic chronic kidney disease: Secondary | ICD-10-CM | POA: Diagnosis not present

## 2022-03-19 DIAGNOSIS — N182 Chronic kidney disease, stage 2 (mild): Secondary | ICD-10-CM

## 2022-03-19 DIAGNOSIS — K219 Gastro-esophageal reflux disease without esophagitis: Secondary | ICD-10-CM | POA: Insufficient documentation

## 2022-03-19 DIAGNOSIS — I5032 Chronic diastolic (congestive) heart failure: Secondary | ICD-10-CM | POA: Insufficient documentation

## 2022-03-19 DIAGNOSIS — E785 Hyperlipidemia, unspecified: Secondary | ICD-10-CM | POA: Diagnosis not present

## 2022-03-19 DIAGNOSIS — R42 Dizziness and giddiness: Secondary | ICD-10-CM | POA: Diagnosis not present

## 2022-03-19 DIAGNOSIS — I10 Essential (primary) hypertension: Secondary | ICD-10-CM | POA: Diagnosis not present

## 2022-03-19 DIAGNOSIS — I480 Paroxysmal atrial fibrillation: Secondary | ICD-10-CM | POA: Diagnosis not present

## 2022-03-19 DIAGNOSIS — R5383 Other fatigue: Secondary | ICD-10-CM | POA: Diagnosis not present

## 2022-03-19 NOTE — Progress Notes (Signed)
Patient ID: Victoria Dalton, female    DOB: [birth year], 88 y.o.   MRN: [id number]  HPI  Victoria Dalton is a 88 y/o female with a history of DM, hyperlipidemia, HTN, CKD, GERD, kidney stone, UTI and chronic heart failure.   Echo report from 03/09/22 reviewed and showed an EF of 50-55% along with moderate LVH and moderate MR.   Admitted 03/08/22 due to SOB on exertion. Found to have new onset AF along with pulmonary edema on CXR. Cardiology consult obtained. Discharged after 4 days.   She presents today for her initial visit with a chief complaint of moderate fatigue with minimal exertion. She describes this as chronic in nature. She has associated shortness of breath, pedal edema and light-headedness along with this. She denies any difficulty sleeping, abdominal distention, palpitations, chest pain, wheezing or cough.   She is currently at [employer] and says that she's not getting weighed daily and that she should be going home in 2 days.   [facility] did not send a med list and patient doesn't know what she is taking.   Past Medical History:  Diagnosis Date   CHF (congestive heart failure) (HCC)    Diabetes mellitus without complication (HCC)    Dyspnea    Gallbladder calculus    GERD (gastroesophageal reflux disease)    Hyperlipidemia    Hypertension    Hypertensive retinopathy    OU   Kidney stone    Macular degeneration    Dry OU   Mass    Parotid swelling    UTI (lower urinary tract infection)    Past Surgical History:  Procedure Laterality Date   CATARACT EXTRACTION Bilateral 02/2020   Dr. Vevelyn Royals   CHOLECYSTECTOMY N/A 04/30/2016   Procedure: CHOLECYSTECTOMY;  Surgeon: Jules Husbands, MD;  Location: [facility] ORS;  Service: General;  Laterality: N/A;   DIAGNOSTIC LAPAROSCOPIC LIVER BIOPSY  02/28/2016   Procedure: DIAGNOSTIC LAPAROSCOPIC LIVER BIOPSY;  Surgeon: Jules Husbands, MD;  Location: [facility] ORS;  Service: General;;   ERCP N/A 01/13/2016   Procedure:  ENDOSCOPIC RETROGRADE CHOLANGIOPANCREATOGRAPHY (ERCP);  Surgeon: Lucilla Lame, MD;  Location[facility] ENDOSCOPY;  Service: Endoscopy;  Laterality: N/A;   ERCP N/A 04/08/2016   Procedure: ENDOSCOPIC RETROGRADE CHOLANGIOPANCREATOGRAPHY (ERCP) Stent removal;  Surgeon: Lucilla Lame, MD;  Location: [facility] ENDOSCOPY;  Service: Endoscopy;  Laterality: N/A;   EYE SURGERY Bilateral 02/2020   Cat Sx - Dr. Vevelyn Royals   INTRAOPERATIVE CHOLANGIOGRAM  04/30/2016   Procedure: INTRAOPERATIVE CHOLANGIOGRAM;  Surgeon: Jules Husbands, MD;  Location: [facility] ORS;  Service: General;;   KNEE ARTHROSCOPY Left    LAPAROSCOPY  02/28/2016   Procedure: Diagnotic laparoscopy with omental biopsy;  Surgeon: Jules Husbands, MD;  Location: [facility] ORS;  Service: General;;   TONSILLECTOMY     Family History  Problem Relation Age of Onset   Cancer Father    Heart attack Father    Cancer Brother    Diabetes Brother    Diabetes Son    Breast cancer Neg Hx    Social History   Tobacco Use   Smoking status: Never   Smokeless tobacco: Never  Substance Use Topics   Alcohol use: No   Allergies  Allergen Reactions   Chlorhexidine Itching   Ramipril Other (See Comments)    IRREGULAR HEART BEAT   Penicillins Rash    Has patient had a PCN reaction causing immediate rash, facial/tongue/throat swelling, SOB or lightheadedness with hypotension: Yes Has patient had a  PCN reaction causing severe rash involving mucus membranes or skin necrosis: Yes Has patient had a PCN reaction that required hospitalization No Has patient had a PCN reaction occurring within the last 10 years: No If all of the above answers are "NO", then may proceed with Cephalosporin use.    Poison Oak Extract Rash   Sulfa Antibiotics Rash   Prior to Admission medications   Medication Sig Start Date End Date Taking? Authorizing Provider  acetaminophen (TYLENOL) 500 MG tablet Take 500 mg by mouth every 6 (six) hours as needed for mild pain.   Yes [provider]  apixaban (ELIQUIS) 5 MG TABS tablet Take 1 tablet (5 mg total) by mouth 2 (two) times daily. 03/12/22 04/11/22 Yes Wyvonnia Dusky, MD  carboxymethylcellulose (REFRESH PLUS) 0.5 % SOLN 1 drop 3 (three) times daily as needed.   Yes [provider]  conjugated estrogens (PREMARIN) vaginal cream Place 1 Applicatorful vaginally 2 (two) times a week. 1 gram vaginally at bedtime twice weekly 02/14/21  Yes Will Bonnet, MD  furosemide (LASIX) 40 MG tablet Take 1 tablet (40 mg total) by mouth daily. 03/12/22 04/11/22 Yes Wyvonnia Dusky, MD  glimepiride (AMARYL) 4 MG tablet TAKE 1 TABLET(4 MG) BY MOUTH DAILY BEFORE BREAKFAST 10/22/21  Yes Masoud, Viann Shove, MD  losartan (COZAAR) 100 MG tablet TAKE 1 TABLET BY MOUTH DAILY 12/30/21  Yes Masoud, Viann Shove, MD  metoprolol tartrate (LOPRESSOR) 100 MG tablet Take 1 tablet (100 mg total) by mouth 2 (two) times daily. 03/12/22 04/11/22 Yes Wyvonnia Dusky, MD  Multiple Vitamins-Minerals (PRESERVISION AREDS 2 PO) Take by mouth.   Yes [provider]  ONE TOUCH ULTRA TEST test strip  11/02/15  Yes [provider]  pantoprazole (PROTONIX) 40 MG tablet TAKE 1 TABLET(40 MG) BY MOUTH DAILY 12/30/21  Yes Masoud, Viann Shove, MD  simvastatin (ZOCOR) 40 MG tablet Take 1 tablet (40 mg total) by mouth daily. 07/03/21  Yes Masoud, Viann Shove, MD  albuterol (VENTOLIN HFA) 108 (90 Base) MCG/ACT inhaler Inhale 2 puffs into the lungs every 6 (six) hours as needed for wheezing or shortness of breath. Patient not taking: Reported on 03/19/2022 03/05/22   Cletis Athens, MD  calcium carbonate (OS-CAL) 1250 (500 Ca) MG chewable tablet Chew by mouth. Patient not taking: Reported on 03/08/2022    [provider]  diltiazem (CARDIZEM CD) 240 MG 24 hr capsule Take 1 capsule (240 mg total) by mouth daily. Patient not taking: Reported on 03/19/2022 03/13/22 04/12/22  Wyvonnia Dusky, MD  ketoconazole (NIZORAL) 2 % cream Apply to skin folds once  to twice daily for rash. Patient not taking: Reported on 03/19/2022 09/18/21   Brendolyn Patty, MD  ondansetron (ZOFRAN ODT) 4 MG disintegrating tablet Take 1 tablet (4 mg total) by mouth every 8 (eight) hours as needed for nausea or vomiting. Patient not taking: Reported on 03/08/2022 10/07/20   Paulette Blanch, MD   Review of Systems  Constitutional:  Positive for fatigue (easily). Negative for appetite change.  HENT:  Negative for congestion, postnasal drip and sore throat.   Eyes: Negative.   Respiratory:  Positive for shortness of breath. Negative for cough, chest tightness and wheezing.   Cardiovascular:  Positive for leg swelling. Negative for chest pain and palpitations.  Gastrointestinal:  Negative for abdominal distention and abdominal pain.  Endocrine: Negative.   Genitourinary: Negative.   Musculoskeletal:  Negative for back pain and neck pain.  Skin: Negative.   Allergic/Immunologic: Negative.   Neurological:  Positive for light-headedness (with changing of positions). Negative for dizziness.  Hematological:  Negative for adenopathy. Does not bruise/bleed easily.  Psychiatric/Behavioral:  Negative for dysphoric mood and sleep disturbance (sleeping on 1 pillow). The patient is not nervous/anxious.    Vitals:   03/19/22 1327  BP: (!) 106/58  Pulse: 65  Resp: 16  SpO2: 93%  Weight: 267 lb 4 oz (121.2 kg)  Height: '5\' 9"'$  (1.753 m)   Wt Readings from Last 3 Encounters:  03/19/22 267 lb 4 oz (121.2 kg)  03/12/22 268 lb 8.3 oz (121.8 kg)  03/03/22 271 lb 3.2 oz (123 kg)   Lab Results  Component Value Date   CREATININE 1.07 (H) 03/12/2022   CREATININE 1.18 (H) 03/11/2022   CREATININE 1.25 (H) 03/10/2022    Physical Exam Vitals and nursing note reviewed. Exam conducted with a chaperone present (daughter).  Constitutional:      Appearance: Normal appearance.  HENT:     Head: Normocephalic and atraumatic.  Cardiovascular:     Rate and Rhythm: Normal rate and regular rhythm.   Pulmonary:     Effort: Pulmonary effort is normal. No respiratory distress.     Breath sounds: No wheezing or rales.  Abdominal:     General: There is no distension.     Palpations: Abdomen is soft.     Tenderness: There is no abdominal tenderness.  Musculoskeletal:        General: No tenderness.     Cervical back: Normal range of motion and neck supple.     Right lower leg: Edema (trace pitting) present.     Left lower leg: No edema.  Skin:    General: Skin is warm and dry.  Neurological:     General: No focal deficit present.     Mental Status: She is alert and oriented to person, place, and time.  Psychiatric:        Mood and Affect: Mood normal.        Behavior: Behavior normal.        Thought Content: Thought content normal.    Assessment & Plan:  1: Chronic heart failure with preserved ejection fraction with structural changes (LVH)- - NYHA class III - euvolemic today - not being weighed daily but does have scales at home; instructed to weigh every morning, write the weight down and call for an overnight weight gain of > 2 pounds or a weekly weight gain of > 5 pounds - not adding salt to her food - unsure of fluid intake; reviewed the importance of keeping daily fluid intake to 60-64 ounces daily and this includes anything that is liquid at room temperature - has a history of UTI's so not a good candidate for SGLT2 - consider changing her losartan to entresto but since we don't know exactly what she's taking, will defer this until her next visit - [facility] has been called requesting patient's med list - BNP 03/08/22 was 495.3 - has received her flu vaccine for the season  2: HTN- - BP looks good (106/58) - saw PCP (Masoud) 03/03/22 and is now seeing PCP at SNF - BMP 03/12/22 reviewed and showed sodium 138, potassium 3.9, creatinine 1.07 & GFR 50  3: DM- - A1c 03/03/22 was 8.1%  4: Atrial fibrillation- - currently rate controlled   No medication list was sent with  patient from SNF; have requested those records be faxed to [place].   Return in 1 month, sooner if needed.

## 2022-03-19 NOTE — Patient Instructions (Addendum)
Once you get home, start weighing daily and call for an overnight weight gain of 3 pounds or more or a weekly weight gain of more than 5 pounds.   If you have voicemail, please make sure your mailbox is cleaned out so that we may leave a message and please make sure to listen to any voicemails.    Drink  60-64 ounces of fluids daily. This includes anything that is a liquid at room temperature (ice cream etc)

## 2022-03-20 DIAGNOSIS — J9600 Acute respiratory failure, unspecified whether with hypoxia or hypercapnia: Secondary | ICD-10-CM | POA: Diagnosis not present

## 2022-03-20 DIAGNOSIS — I4891 Unspecified atrial fibrillation: Secondary | ICD-10-CM | POA: Diagnosis not present

## 2022-03-20 DIAGNOSIS — M6281 Muscle weakness (generalized): Secondary | ICD-10-CM | POA: Diagnosis not present

## 2022-03-20 DIAGNOSIS — I5033 Acute on chronic diastolic (congestive) heart failure: Secondary | ICD-10-CM | POA: Diagnosis not present

## 2022-03-25 DIAGNOSIS — Z7901 Long term (current) use of anticoagulants: Secondary | ICD-10-CM | POA: Diagnosis not present

## 2022-03-25 DIAGNOSIS — Z7984 Long term (current) use of oral hypoglycemic drugs: Secondary | ICD-10-CM | POA: Diagnosis not present

## 2022-03-25 DIAGNOSIS — K2100 Gastro-esophageal reflux disease with esophagitis, without bleeding: Secondary | ICD-10-CM | POA: Diagnosis not present

## 2022-03-25 DIAGNOSIS — E1122 Type 2 diabetes mellitus with diabetic chronic kidney disease: Secondary | ICD-10-CM | POA: Diagnosis not present

## 2022-03-25 DIAGNOSIS — I2510 Atherosclerotic heart disease of native coronary artery without angina pectoris: Secondary | ICD-10-CM | POA: Diagnosis not present

## 2022-03-25 DIAGNOSIS — I5033 Acute on chronic diastolic (congestive) heart failure: Secondary | ICD-10-CM | POA: Diagnosis not present

## 2022-03-25 DIAGNOSIS — N183 Chronic kidney disease, stage 3 unspecified: Secondary | ICD-10-CM | POA: Diagnosis not present

## 2022-03-25 DIAGNOSIS — I130 Hypertensive heart and chronic kidney disease with heart failure and stage 1 through stage 4 chronic kidney disease, or unspecified chronic kidney disease: Secondary | ICD-10-CM | POA: Diagnosis not present

## 2022-03-25 DIAGNOSIS — E569 Vitamin deficiency, unspecified: Secondary | ICD-10-CM | POA: Diagnosis not present

## 2022-03-25 DIAGNOSIS — J45901 Unspecified asthma with (acute) exacerbation: Secondary | ICD-10-CM | POA: Diagnosis not present

## 2022-03-25 DIAGNOSIS — I4891 Unspecified atrial fibrillation: Secondary | ICD-10-CM | POA: Diagnosis not present

## 2022-03-26 ENCOUNTER — Encounter: Payer: Self-pay | Admitting: Internal Medicine

## 2022-03-26 ENCOUNTER — Ambulatory Visit (INDEPENDENT_AMBULATORY_CARE_PROVIDER_SITE_OTHER): Payer: Medicare Other | Admitting: Internal Medicine

## 2022-03-26 VITALS — BP 109/71 | HR 72 | Ht 69.0 in | Wt 260.9 lb

## 2022-03-26 DIAGNOSIS — I4891 Unspecified atrial fibrillation: Secondary | ICD-10-CM

## 2022-03-26 DIAGNOSIS — Z7901 Long term (current) use of anticoagulants: Secondary | ICD-10-CM | POA: Diagnosis not present

## 2022-03-26 DIAGNOSIS — N9089 Other specified noninflammatory disorders of vulva and perineum: Secondary | ICD-10-CM

## 2022-03-26 DIAGNOSIS — N183 Chronic kidney disease, stage 3 unspecified: Secondary | ICD-10-CM | POA: Diagnosis not present

## 2022-03-26 DIAGNOSIS — N952 Postmenopausal atrophic vaginitis: Secondary | ICD-10-CM | POA: Diagnosis not present

## 2022-03-26 DIAGNOSIS — I10 Essential (primary) hypertension: Secondary | ICD-10-CM | POA: Diagnosis not present

## 2022-03-26 DIAGNOSIS — E119 Type 2 diabetes mellitus without complications: Secondary | ICD-10-CM | POA: Diagnosis not present

## 2022-03-26 DIAGNOSIS — J45901 Unspecified asthma with (acute) exacerbation: Secondary | ICD-10-CM | POA: Diagnosis not present

## 2022-03-26 DIAGNOSIS — E1122 Type 2 diabetes mellitus with diabetic chronic kidney disease: Secondary | ICD-10-CM | POA: Diagnosis not present

## 2022-03-26 DIAGNOSIS — K2100 Gastro-esophageal reflux disease with esophagitis, without bleeding: Secondary | ICD-10-CM | POA: Diagnosis not present

## 2022-03-26 DIAGNOSIS — K219 Gastro-esophageal reflux disease without esophagitis: Secondary | ICD-10-CM

## 2022-03-26 DIAGNOSIS — N905 Atrophy of vulva: Secondary | ICD-10-CM | POA: Diagnosis not present

## 2022-03-26 DIAGNOSIS — I130 Hypertensive heart and chronic kidney disease with heart failure and stage 1 through stage 4 chronic kidney disease, or unspecified chronic kidney disease: Secondary | ICD-10-CM | POA: Diagnosis not present

## 2022-03-26 DIAGNOSIS — I5033 Acute on chronic diastolic (congestive) heart failure: Secondary | ICD-10-CM

## 2022-03-26 DIAGNOSIS — I2510 Atherosclerotic heart disease of native coronary artery without angina pectoris: Secondary | ICD-10-CM | POA: Diagnosis not present

## 2022-03-26 DIAGNOSIS — Z7984 Long term (current) use of oral hypoglycemic drugs: Secondary | ICD-10-CM | POA: Diagnosis not present

## 2022-03-26 DIAGNOSIS — N390 Urinary tract infection, site not specified: Secondary | ICD-10-CM

## 2022-03-26 DIAGNOSIS — E569 Vitamin deficiency, unspecified: Secondary | ICD-10-CM | POA: Diagnosis not present

## 2022-03-26 LAB — GLUCOSE, POCT (MANUAL RESULT ENTRY): POC Glucose: 155 mg/dl — AB (ref 70–99)

## 2022-03-26 MED ORDER — SIMVASTATIN 40 MG PO TABS: 40.0000 mg | ORAL_TABLET | Freq: Every day | ORAL | 3 refills | Status: AC

## 2022-03-26 MED ORDER — FUROSEMIDE 40 MG PO TABS: 40.0000 mg | ORAL_TABLET | Freq: Every day | ORAL | 3 refills | Status: DC

## 2022-03-26 MED ORDER — ESTROGENS CONJUGATED 0.625 MG/GM VA CREA: 1.0000 | TOPICAL_CREAM | VAGINAL | 3 refills | Status: DC

## 2022-03-26 MED ORDER — METOPROLOL TARTRATE 100 MG PO TABS: 100.0000 mg | ORAL_TABLET | Freq: Two times a day (BID) | ORAL | 3 refills | Status: DC

## 2022-03-26 MED ORDER — APIXABAN 5 MG PO TABS: 5.0000 mg | ORAL_TABLET | Freq: Two times a day (BID) | ORAL | 0 refills | Status: DC

## 2022-03-26 MED ORDER — GLIMEPIRIDE 4 MG PO TABS: ORAL_TABLET | ORAL | 3 refills | Status: DC

## 2022-03-26 MED ORDER — DILTIAZEM HCL ER COATED BEADS 240 MG PO CP24: 240.0000 mg | ORAL_CAPSULE | Freq: Every day | ORAL | 3 refills | Status: AC

## 2022-03-26 MED ORDER — PANTOPRAZOLE SODIUM 40 MG PO TBEC: DELAYED_RELEASE_TABLET | ORAL | 3 refills | Status: AC

## 2022-03-26 NOTE — Assessment & Plan Note (Signed)
Rate is under control continue Eliquis

## 2022-03-26 NOTE — Assessment & Plan Note (Signed)
Patient has lost 13 pounds

## 2022-03-26 NOTE — Progress Notes (Signed)
Established Patient Office Visit  Subjective:  Patient ID: Victoria Dalton, female    DOB: 1931/02/12  Age: 88 y.o. MRN: [id number]  CC:  Chief Complaint  Patient presents with   Hospitalization Follow-up    Patient admitted on 03/08/22 and discharged on 03/12/22    HPI  Victoria Dalton presents for check up, med reviewed  Past Medical History:  Diagnosis Date   CHF (congestive heart failure) ([place])    Diabetes mellitus without complication ([place])    Dyspnea    Gallbladder calculus    GERD (gastroesophageal reflux disease)    Hyperlipidemia    Hypertension    Hypertensive retinopathy    OU   Kidney stone    Macular degeneration    Dry OU   Mass    Parotid swelling    UTI (lower urinary tract infection)     Past Surgical History:  Procedure Laterality Date   CATARACT EXTRACTION Bilateral 02/2020   Dr. Vevelyn Royals   CHOLECYSTECTOMY N/A 04/30/2016   Procedure: CHOLECYSTECTOMY;  Surgeon: Jules Husbands, MD;  Location: [facility] ORS;  Service: General;  Laterality: N/A;   DIAGNOSTIC LAPAROSCOPIC LIVER BIOPSY  02/28/2016   Procedure: DIAGNOSTIC LAPAROSCOPIC LIVER BIOPSY;  Surgeon: Jules Husbands, MD;  Location: [facility] ORS;  Service: General;;   ERCP N/A 01/13/2016   Procedure: ENDOSCOPIC RETROGRADE CHOLANGIOPANCREATOGRAPHY (ERCP);  Surgeon: Lucilla Lame, MD;  Location: [facility] ENDOSCOPY;  Service: Endoscopy;  Laterality: N/A;   ERCP N/A 04/08/2016   Procedure: ENDOSCOPIC RETROGRADE CHOLANGIOPANCREATOGRAPHY (ERCP) Stent removal;  Surgeon: Lucilla Lame, MD;  Location: [facility] ENDOSCOPY;  Service: Endoscopy;  Laterality: N/A;   EYE SURGERY Bilateral 02/2020   Cat Sx - Dr. Vevelyn Royals   INTRAOPERATIVE CHOLANGIOGRAM  04/30/2016   Procedure: INTRAOPERATIVE CHOLANGIOGRAM;  Surgeon: Jules Husbands, MD;  Location: [facility] ORS;  Service: General;;   KNEE ARTHROSCOPY Left    LAPAROSCOPY  02/28/2016   Procedure: Diagnotic laparoscopy with omental biopsy;  Surgeon: Jules Husbands, MD;  Location:  [facility] ORS;  Service: General;;   TONSILLECTOMY      Family History  Problem Relation Age of Onset   Cancer Father    Heart attack Father    Cancer Brother    Diabetes Brother    Diabetes Son    Breast cancer Neg Hx     Social History   Socioeconomic History   Marital status: Widowed    Spouse name: Not on file   Number of children: Not on file   Years of education: Not on file   Highest education level: Not on file  Occupational History   Not on file  Tobacco Use   Smoking status: Never   Smokeless tobacco: Never  Vaping Use   Vaping Use: Never used  Substance and Sexual Activity   Alcohol use: No   Drug use: No   Sexual activity: Not Currently  Other Topics Concern   Not on file  Social History Narrative   Not on file   Social Determinants of Health   Financial Resource Strain: Low Risk  (02/21/2021)   Overall Financial Resource Strain (CARDIA)    Difficulty of Paying Living Expenses: Not very hard  Food Insecurity: No Food Insecurity (02/21/2021)   Hunger Vital Sign    Worried About Running Out of Food in the Last Year: Never true    Ran Out of Food in the Last Year: Never true  Transportation Needs: No Transportation Needs (02/21/2021)   [place] - Transportation  Lack of Transportation (Medical): No    Lack of Transportation (Non-Medical): No  Physical Activity: Inactive (02/21/2021)   Exercise Vital Sign    Days of Exercise per Week: 0 days    Minutes of Exercise per Session: 0 min  Stress: No Stress Concern Present (02/21/2021)   [place]    Feeling of Stress : Not at all  Social Connections: Socially Isolated (02/21/2021)   Social Connection and Isolation Panel [NHANES]    Frequency of Communication with Friends and Family: More than three times a week    Frequency of Social Gatherings with Friends and Family: More than three times a week    Attends Religious Services: Never    Building surveyor or Organizations: No    Attends Archivist Meetings: Never    Marital Status: Widowed  Intimate Partner Violence: Not At Risk (02/21/2021)   Humiliation, Afraid, Rape, and Kick questionnaire    Fear of Current or Ex-Partner: No    Emotionally Abused: No    Physically Abused: No    Sexually Abused: No     Current Outpatient Medications:    acetaminophen (TYLENOL) 500 MG tablet, Take 500 mg by mouth every 6 (six) hours as needed for mild pain., Disp: , Rfl:    carboxymethylcellulose (REFRESH PLUS) 0.5 % SOLN, 1 drop 3 (three) times daily as needed., Disp: , Rfl:    Multiple Vitamins-Minerals (PRESERVISION AREDS 2 PO), Take by mouth., Disp: , Rfl:    ONE TOUCH ULTRA TEST test strip, , Disp: , Rfl: 0   apixaban (ELIQUIS) 5 MG TABS tablet, Take 1 tablet (5 mg total) by mouth 2 (two) times daily., Disp: 60 tablet, Rfl: 0   [START ON 03/27/2022] conjugated estrogens (PREMARIN) vaginal cream, Place 1 Applicatorful vaginally 2 (two) times a week. 1 gram vaginally at bedtime twice weekly, Disp: 30 g, Rfl: 3   diltiazem (CARDIZEM CD) 240 MG 24 hr capsule, Take 1 capsule (240 mg total) by mouth daily., Disp: 90 capsule, Rfl: 3   furosemide (LASIX) 40 MG tablet, Take 1 tablet (40 mg total) by mouth daily., Disp: 90 tablet, Rfl: 3   glimepiride (AMARYL) 4 MG tablet, TAKE 1 TABLET(4 MG) BY MOUTH DAILY BEFORE BREAKFAST, Disp: 90 tablet, Rfl: 3   losartan (COZAAR) 100 MG tablet, TAKE 1 TABLET BY MOUTH DAILY (Patient not taking: Reported on 03/26/2022), Disp: 30 tablet, Rfl: 6   metoprolol tartrate (LOPRESSOR) 100 MG tablet, Take 1 tablet (100 mg total) by mouth 2 (two) times daily., Disp: 180 tablet, Rfl: 3   pantoprazole (PROTONIX) 40 MG tablet, TAKE 1 TABLET(40 MG) BY MOUTH DAILY, Disp: 90 tablet, Rfl: 3   simvastatin (ZOCOR) 40 MG tablet, Take 1 tablet (40 mg total) by mouth daily., Disp: 90 tablet, Rfl: 3   Allergies  Allergen Reactions   Chlorhexidine Itching   Ramipril Other  (See Comments)    IRREGULAR HEART BEAT   Penicillins Rash    Has patient had a PCN reaction causing immediate rash, facial/tongue/throat swelling, SOB or lightheadedness with hypotension: Yes Has patient had a PCN reaction causing severe rash involving mucus membranes or skin necrosis: Yes Has patient had a PCN reaction that required hospitalization No Has patient had a PCN reaction occurring within the last 10 years: No If all of the above answers are "NO", then may proceed with Cephalosporin use.    Poison Oak Extract Rash   Sulfa Antibiotics Rash  ROS Review of Systems  Constitutional:  Positive for fatigue.  HENT:  Negative for congestion.   Respiratory:  Positive for shortness of breath. Negative for chest tightness.   Cardiovascular:  Negative for chest pain and leg swelling.  Gastrointestinal:  Positive for abdominal distention.  Genitourinary:  Negative for difficulty urinating.  Neurological:  Negative for syncope and headaches.      Objective:    Physical Exam Constitutional:      Appearance: She is obese.  HENT:     Head: Atraumatic.     Nose: Nose normal.  Cardiovascular:     Rate and Rhythm: Rhythm irregular.  Pulmonary:     Breath sounds: No rhonchi.     BP 109/71   Pulse 72   Ht 5' 9" (1.753 m)   Wt 260 lb 14.4 oz (118.3 kg)   BMI 38.53 kg/m  Wt Readings from Last 3 Encounters:  03/26/22 260 lb 14.4 oz (118.3 kg)  03/19/22 267 lb 4 oz (121.2 kg)  03/12/22 268 lb 8.3 oz (121.8 kg)     Health Maintenance Due  Topic Date Due   FOOT EXAM  Never done   COVID-19 Vaccine (3 - Moderna risk series) 09/05/2019   Medicare Annual Wellness (AWV)  02/21/2022    There are no preventive care reminders to display for this patient.  Lab Results  Component Value Date   TSH 1.993 03/08/2022   Lab Results  Component Value Date   WBC 9.9 03/10/2022   HGB 12.4 03/10/2022   HCT 37.6 03/10/2022   MCV 92.2 03/10/2022   PLT 213 03/10/2022   Lab Results   Component Value Date   NA 138 03/12/2022   K 3.9 03/12/2022   CO2 29 03/12/2022   GLUCOSE 161 (H) 03/12/2022   BUN 33 (H) 03/12/2022   CREATININE 1.07 (H) 03/12/2022   BILITOT 1.1 03/08/2022   ALKPHOS 113 03/08/2022   AST 32 03/08/2022   ALT 36 03/08/2022   PROT 6.7 03/08/2022   ALBUMIN 3.8 03/08/2022   CALCIUM 8.7 (L) 03/12/2022   ANIONGAP 9 03/12/2022   EGFR 51 (L) 03/03/2022   Lab Results  Component Value Date   CHOL 131 03/03/2022   Lab Results  Component Value Date   HDL 47 (L) 03/03/2022   Lab Results  Component Value Date   LDLCALC 67 03/03/2022   Lab Results  Component Value Date   TRIG 91 03/03/2022   Lab Results  Component Value Date   CHOLHDL 2.8 03/03/2022   Lab Results  Component Value Date   HGBA1C 8.1 (H) 03/03/2022      Assessment & Plan:   Problem List Items Addressed This Visit       Cardiovascular and Mediastinum   Benign essential HTN - Primary    Stable at the present time, DASH Diet: Care Instructions Your Care Instructions  The DASH diet is an eating plan that can help lower your blood pressure. DASH stands for Dietary Approaches to Stop Hypertension. Hypertension is high blood pressure. The DASH diet focuses on eating foods that are high in calcium, potassium, and magnesium. These nutrients can lower blood pressure. The foods that are highest in these nutrients are fruits, vegetables, low-fat dairy products, nuts, seeds, and legumes. But taking calcium, potassium, and magnesium supplements instead of eating foods that are high in those nutrients does not have the same effect. The DASH diet also includes whole grains, fish, and poultry. The DASH diet is one of several lifestyle  changes your doctor may recommend to lower your high blood pressure. Your doctor may also want you to decrease the amount of sodium in your diet. Lowering sodium while following the DASH diet can lower blood pressure even further than just the DASH diet  alone. Follow-up care is a key part of your treatment and safety. Be sure to make and go to all appointments, and call your doctor if you are having problems. It's also a good idea to know your test results and keep a list of the medicines you take. How can you care for yourself at home? Following the DASH diet  Eat 4 to 5 servings of fruit each day. A serving is 1 medium-sized piece of fruit,  cup chopped or canned fruit, 1/4 cup dried fruit, or 4 ounces ( cup) of fruit juice. Choose fruit more often than fruit juice.  Eat 4 to 5 servings of vegetables each day. A serving is 1 cup of lettuce or raw leafy vegetables,  cup of chopped or cooked vegetables, or 4 ounces ( cup) of vegetable juice. Choose vegetables more often than vegetable juice.  Get 2 to 3 servings of low-fat and fat-free dairy each day. A serving is 8 ounces of milk, 1 cup of yogurt, or 1  ounces of cheese.  Eat 6 to 8 servings of grains each day. A serving is 1 slice of bread, 1 ounce of dry cereal, or  cup of cooked rice, pasta, or cooked cereal. Try to choose whole-grain products as much as possible.  Limit lean meat, poultry, and fish to 2 servings each day. A serving is 3 ounces, about the size of a deck of cards.  Eat 4 to 5 servings of nuts, seeds, and legumes (cooked dried beans, lentils, and split peas) each week. A serving is 1/3 cup of nuts, 2 tablespoons of seeds, or  cup of cooked beans or peas.  Limit fats and oils to 2 to 3 servings each day. A serving is 1 teaspoon of vegetable oil or 2 tablespoons of salad dressing.  Limit sweets and added sugars to 5 servings or less a week. A serving is 1 tablespoon jelly or jam,  cup sorbet, or 1 cup of lemonade.  Eat less than 2,300 milligrams (mg) of sodium a day. If you limit your sodium to 1,500 mg a day, you can lower your blood pressure even more. Tips for success  Start small. Do not try to make dramatic changes to your diet all at once. You might feel that  you are missing out on your favorite foods and then be more likely to not follow the plan. Make small changes, and stick with them. Once those changes become habit, add a few more changes.  Try some of the following: ? Make it a goal to eat a fruit or vegetable at every meal and at snacks. This will make it easy to get the recommended amount of fruits and vegetables each day. ? Try yogurt topped with fruit and nuts for a snack or healthy dessert. ? Add lettuce, tomato, cucumber, and onion to sandwiches. ? Combine a ready-made pizza crust with low-fat mozzarella cheese and lots of vegetable toppings. Try using tomatoes, squash, spinach, broccoli, carrots, cauliflower, and onions. ? Have a variety of cut-up vegetables with a low-fat dip as an appetizer instead of chips and dip. ? Sprinkle sunflower seeds or chopped almonds over salads. Or try adding chopped walnuts or almonds to cooked vegetables. ? Try some vegetarian meals  using beans and peas. Add garbanzo or kidney beans to salads. Make burritos and tacos with mashed pinto beans or black beans.      Relevant Medications   apixaban (ELIQUIS) 5 MG TABS tablet   diltiazem (CARDIZEM CD) 240 MG 24 hr capsule   furosemide (LASIX) 40 MG tablet   metoprolol tartrate (LOPRESSOR) 100 MG tablet   simvastatin (ZOCOR) 40 MG tablet   Atrial fibrillation with RVR (HCC)    Rate is under control continue Eliquis      Relevant Medications   apixaban (ELIQUIS) 5 MG TABS tablet   diltiazem (CARDIZEM CD) 240 MG 24 hr capsule   furosemide (LASIX) 40 MG tablet   metoprolol tartrate (LOPRESSOR) 100 MG tablet   simvastatin (ZOCOR) 40 MG tablet   Acute on chronic diastolic CHF (congestive heart failure) (HCC)    Chest examination does not reveal any wheezing, she lost 13 pounds      Relevant Medications   apixaban (ELIQUIS) 5 MG TABS tablet   diltiazem (CARDIZEM CD) 240 MG 24 hr capsule   furosemide (LASIX) 40 MG tablet   metoprolol tartrate (LOPRESSOR)  100 MG tablet   simvastatin (ZOCOR) 40 MG tablet     Digestive   Gastro-esophageal reflux disease without esophagitis   Relevant Medications   pantoprazole (PROTONIX) 40 MG tablet     Endocrine   Controlled type 2 diabetes mellitus without complication (HCC)    - The patient's blood sugar is labile on med. - The patient will continue the current treatment regimen.  - I encouraged the patient to regularly check blood sugar.  - I encouraged the patient to monitor diet. I encouraged the patient to eat low-carb and low-sugar to help prevent blood sugar spikes.  - I encouraged the patient to continue following their prescribed treatment plan for diabetes - I informed the patient to get help if blood sugar drops below 22m/dL, or if suddenly have trouble thinking clearly or breathing.  Patient was advised to buy a book on diabetes from a local bookstore or from [place]  Patient should read 2 chapters every day to keep the motivation going, this is in addition to some of the materials we provided them from the office.  There are other resources on the Internet like YouTube and wilkipedia to get an education on the diabetes      Relevant Medications   glimepiride (AMARYL) 4 MG tablet   simvastatin (ZOCOR) 40 MG tablet   Other Relevant Orders   POCT glucose (manual entry) (Completed)     Other   Class 3 severe obesity due to excess calories in adult ([facility]    Patient has lost 13 pounds      Relevant Medications   glimepiride (AMARYL) 4 MG tablet   Other Visit Diagnoses     Vulvar irritation       Relevant Medications   conjugated estrogens (PREMARIN) vaginal cream (Start on 03/27/2022)   Vulvar atrophy       Relevant Medications   conjugated estrogens (PREMARIN) vaginal cream (Start on 03/27/2022)   Vaginal atrophy       Relevant Medications   conjugated estrogens (PREMARIN) vaginal cream (Start on 03/27/2022)   Recurrent UTI       Relevant Medications   conjugated estrogens  (PREMARIN) vaginal cream (Start on 03/27/2022)       Meds ordered this encounter  Medications   apixaban (ELIQUIS) 5 MG TABS tablet    Sig: Take 1 tablet (5 mg total)  by mouth 2 (two) times daily.    Dispense:  60 tablet    Refill:  0   conjugated estrogens (PREMARIN) vaginal cream    Sig: Place 1 Applicatorful vaginally 2 (two) times a week. 1 gram vaginally at bedtime twice weekly    Dispense:  30 g    Refill:  3   diltiazem (CARDIZEM CD) 240 MG 24 hr capsule    Sig: Take 1 capsule (240 mg total) by mouth daily.    Dispense:  90 capsule    Refill:  3   furosemide (LASIX) 40 MG tablet    Sig: Take 1 tablet (40 mg total) by mouth daily.    Dispense:  90 tablet    Refill:  3   glimepiride (AMARYL) 4 MG tablet    Sig: TAKE 1 TABLET(4 MG) BY MOUTH DAILY BEFORE BREAKFAST    Dispense:  90 tablet    Refill:  3   metoprolol tartrate (LOPRESSOR) 100 MG tablet    Sig: Take 1 tablet (100 mg total) by mouth 2 (two) times daily.    Dispense:  180 tablet    Refill:  3   pantoprazole (PROTONIX) 40 MG tablet    Sig: TAKE 1 TABLET(40 MG) BY MOUTH DAILY    Dispense:  90 tablet    Refill:  3   simvastatin (ZOCOR) 40 MG tablet    Sig: Take 1 tablet (40 mg total) by mouth daily.    Dispense:  90 tablet    Refill:  3    Follow-up: No follow-ups on file.    Cletis Athens, MD

## 2022-03-26 NOTE — Assessment & Plan Note (Signed)
Chest examination does not reveal any wheezing, she lost 13 pounds

## 2022-03-26 NOTE — Assessment & Plan Note (Signed)

## 2022-03-26 NOTE — Assessment & Plan Note (Signed)
Stable at the present time, DASH Diet: Care Instructions Your Care Instructions  The DASH diet is an eating plan that can help lower your blood pressure. DASH stands for Dietary Approaches to Stop Hypertension. Hypertension is high blood pressure. The DASH diet focuses on eating foods that are high in calcium, potassium, and magnesium. These nutrients can lower blood pressure. The foods that are highest in these nutrients are fruits, vegetables, low-fat dairy products, nuts, seeds, and legumes. But taking calcium, potassium, and magnesium supplements instead of eating foods that are high in those nutrients does not have the same effect. The DASH diet also includes whole grains, fish, and poultry. The DASH diet is one of several lifestyle changes your doctor may recommend to lower your high blood pressure. Your doctor may also want you to decrease the amount of sodium in your diet. Lowering sodium while following the DASH diet can lower blood pressure even further than just the DASH diet alone. Follow-up care is a key part of your treatment and safety. Be sure to make and go to all appointments, and call your doctor if you are having problems. It's also a good idea to know your test results and keep a list of the medicines you take. How can you care for yourself at home? Following the DASH diet  Eat 4 to 5 servings of fruit each day. A serving is 1 medium-sized piece of fruit,  cup chopped or canned fruit, 1/4 cup dried fruit, or 4 ounces ( cup) of fruit juice. Choose fruit more often than fruit juice.  Eat 4 to 5 servings of vegetables each day. A serving is 1 cup of lettuce or raw leafy vegetables,  cup of chopped or cooked vegetables, or 4 ounces ( cup) of vegetable juice. Choose vegetables more often than vegetable juice.  Get 2 to 3 servings of low-fat and fat-free dairy each day. A serving is 8 ounces of milk, 1 cup of yogurt, or 1  ounces of cheese.  Eat 6 to 8 servings of grains each day.  A serving is 1 slice of bread, 1 ounce of dry cereal, or  cup of cooked rice, pasta, or cooked cereal. Try to choose whole-grain products as much as possible.  Limit lean meat, poultry, and fish to 2 servings each day. A serving is 3 ounces, about the size of a deck of cards.  Eat 4 to 5 servings of nuts, seeds, and legumes (cooked dried beans, lentils, and split peas) each week. A serving is 1/3 cup of nuts, 2 tablespoons of seeds, or  cup of cooked beans or peas.  Limit fats and oils to 2 to 3 servings each day. A serving is 1 teaspoon of vegetable oil or 2 tablespoons of salad dressing.  Limit sweets and added sugars to 5 servings or less a week. A serving is 1 tablespoon jelly or jam,  cup sorbet, or 1 cup of lemonade.  Eat less than 2,300 milligrams (mg) of sodium a day. If you limit your sodium to 1,500 mg a day, you can lower your blood pressure even more. Tips for success  Start small. Do not try to make dramatic changes to your diet all at once. You might feel that you are missing out on your favorite foods and then be more likely to not follow the plan. Make small changes, and stick with them. Once those changes become habit, add a few more changes.  Try some of the following: ? Make it a goal  to eat a fruit or vegetable at every meal and at snacks. This will make it easy to get the recommended amount of fruits and vegetables each day. ? Try yogurt topped with fruit and nuts for a snack or healthy dessert. ? Add lettuce, tomato, cucumber, and onion to sandwiches. ? Combine a ready-made pizza crust with low-fat mozzarella cheese and lots of vegetable toppings. Try using tomatoes, squash, spinach, broccoli, carrots, cauliflower, and onions. ? Have a variety of cut-up vegetables with a low-fat dip as an appetizer instead of chips and dip. ? Sprinkle sunflower seeds or chopped almonds over salads. Or try adding chopped walnuts or almonds to cooked vegetables. ? Try some vegetarian  meals using beans and peas. Add garbanzo or kidney beans to salads. Make burritos and tacos with mashed pinto beans or black beans.

## 2022-03-27 DIAGNOSIS — E569 Vitamin deficiency, unspecified: Secondary | ICD-10-CM | POA: Diagnosis not present

## 2022-03-27 DIAGNOSIS — I5033 Acute on chronic diastolic (congestive) heart failure: Secondary | ICD-10-CM | POA: Diagnosis not present

## 2022-03-27 DIAGNOSIS — Z7984 Long term (current) use of oral hypoglycemic drugs: Secondary | ICD-10-CM | POA: Diagnosis not present

## 2022-03-27 DIAGNOSIS — N183 Chronic kidney disease, stage 3 unspecified: Secondary | ICD-10-CM | POA: Diagnosis not present

## 2022-03-27 DIAGNOSIS — Z7901 Long term (current) use of anticoagulants: Secondary | ICD-10-CM | POA: Diagnosis not present

## 2022-03-27 DIAGNOSIS — I2510 Atherosclerotic heart disease of native coronary artery without angina pectoris: Secondary | ICD-10-CM | POA: Diagnosis not present

## 2022-03-27 DIAGNOSIS — I4891 Unspecified atrial fibrillation: Secondary | ICD-10-CM | POA: Diagnosis not present

## 2022-03-27 DIAGNOSIS — E1122 Type 2 diabetes mellitus with diabetic chronic kidney disease: Secondary | ICD-10-CM | POA: Diagnosis not present

## 2022-03-27 DIAGNOSIS — K2100 Gastro-esophageal reflux disease with esophagitis, without bleeding: Secondary | ICD-10-CM | POA: Diagnosis not present

## 2022-03-27 DIAGNOSIS — I130 Hypertensive heart and chronic kidney disease with heart failure and stage 1 through stage 4 chronic kidney disease, or unspecified chronic kidney disease: Secondary | ICD-10-CM | POA: Diagnosis not present

## 2022-03-27 DIAGNOSIS — J45901 Unspecified asthma with (acute) exacerbation: Secondary | ICD-10-CM | POA: Diagnosis not present

## 2022-03-30 ENCOUNTER — Other Ambulatory Visit: Payer: Self-pay | Admitting: Internal Medicine

## 2022-04-01 DIAGNOSIS — I2510 Atherosclerotic heart disease of native coronary artery without angina pectoris: Secondary | ICD-10-CM | POA: Diagnosis not present

## 2022-04-01 DIAGNOSIS — I130 Hypertensive heart and chronic kidney disease with heart failure and stage 1 through stage 4 chronic kidney disease, or unspecified chronic kidney disease: Secondary | ICD-10-CM | POA: Diagnosis not present

## 2022-04-01 DIAGNOSIS — K2100 Gastro-esophageal reflux disease with esophagitis, without bleeding: Secondary | ICD-10-CM | POA: Diagnosis not present

## 2022-04-01 DIAGNOSIS — E569 Vitamin deficiency, unspecified: Secondary | ICD-10-CM | POA: Diagnosis not present

## 2022-04-01 DIAGNOSIS — Z7901 Long term (current) use of anticoagulants: Secondary | ICD-10-CM | POA: Diagnosis not present

## 2022-04-01 DIAGNOSIS — N183 Chronic kidney disease, stage 3 unspecified: Secondary | ICD-10-CM | POA: Diagnosis not present

## 2022-04-01 DIAGNOSIS — I4891 Unspecified atrial fibrillation: Secondary | ICD-10-CM | POA: Diagnosis not present

## 2022-04-01 DIAGNOSIS — E1122 Type 2 diabetes mellitus with diabetic chronic kidney disease: Secondary | ICD-10-CM | POA: Diagnosis not present

## 2022-04-01 DIAGNOSIS — J45901 Unspecified asthma with (acute) exacerbation: Secondary | ICD-10-CM | POA: Diagnosis not present

## 2022-04-01 DIAGNOSIS — I5033 Acute on chronic diastolic (congestive) heart failure: Secondary | ICD-10-CM | POA: Diagnosis not present

## 2022-04-01 DIAGNOSIS — Z7984 Long term (current) use of oral hypoglycemic drugs: Secondary | ICD-10-CM | POA: Diagnosis not present

## 2022-04-02 DIAGNOSIS — E569 Vitamin deficiency, unspecified: Secondary | ICD-10-CM | POA: Diagnosis not present

## 2022-04-02 DIAGNOSIS — J45901 Unspecified asthma with (acute) exacerbation: Secondary | ICD-10-CM | POA: Diagnosis not present

## 2022-04-02 DIAGNOSIS — I5033 Acute on chronic diastolic (congestive) heart failure: Secondary | ICD-10-CM | POA: Diagnosis not present

## 2022-04-02 DIAGNOSIS — Z7901 Long term (current) use of anticoagulants: Secondary | ICD-10-CM | POA: Diagnosis not present

## 2022-04-02 DIAGNOSIS — K2100 Gastro-esophageal reflux disease with esophagitis, without bleeding: Secondary | ICD-10-CM | POA: Diagnosis not present

## 2022-04-02 DIAGNOSIS — Z7984 Long term (current) use of oral hypoglycemic drugs: Secondary | ICD-10-CM | POA: Diagnosis not present

## 2022-04-02 DIAGNOSIS — E1122 Type 2 diabetes mellitus with diabetic chronic kidney disease: Secondary | ICD-10-CM | POA: Diagnosis not present

## 2022-04-02 DIAGNOSIS — N183 Chronic kidney disease, stage 3 unspecified: Secondary | ICD-10-CM | POA: Diagnosis not present

## 2022-04-02 DIAGNOSIS — I4891 Unspecified atrial fibrillation: Secondary | ICD-10-CM | POA: Diagnosis not present

## 2022-04-02 DIAGNOSIS — I2510 Atherosclerotic heart disease of native coronary artery without angina pectoris: Secondary | ICD-10-CM | POA: Diagnosis not present

## 2022-04-02 DIAGNOSIS — I130 Hypertensive heart and chronic kidney disease with heart failure and stage 1 through stage 4 chronic kidney disease, or unspecified chronic kidney disease: Secondary | ICD-10-CM | POA: Diagnosis not present

## 2022-04-03 ENCOUNTER — Telehealth: Payer: Self-pay

## 2022-04-03 DIAGNOSIS — E569 Vitamin deficiency, unspecified: Secondary | ICD-10-CM | POA: Diagnosis not present

## 2022-04-03 DIAGNOSIS — N183 Chronic kidney disease, stage 3 unspecified: Secondary | ICD-10-CM | POA: Diagnosis not present

## 2022-04-03 DIAGNOSIS — K2100 Gastro-esophageal reflux disease with esophagitis, without bleeding: Secondary | ICD-10-CM | POA: Diagnosis not present

## 2022-04-03 DIAGNOSIS — Z7984 Long term (current) use of oral hypoglycemic drugs: Secondary | ICD-10-CM | POA: Diagnosis not present

## 2022-04-03 DIAGNOSIS — I130 Hypertensive heart and chronic kidney disease with heart failure and stage 1 through stage 4 chronic kidney disease, or unspecified chronic kidney disease: Secondary | ICD-10-CM | POA: Diagnosis not present

## 2022-04-03 DIAGNOSIS — Z7901 Long term (current) use of anticoagulants: Secondary | ICD-10-CM | POA: Diagnosis not present

## 2022-04-03 DIAGNOSIS — I2510 Atherosclerotic heart disease of native coronary artery without angina pectoris: Secondary | ICD-10-CM | POA: Diagnosis not present

## 2022-04-03 DIAGNOSIS — J45901 Unspecified asthma with (acute) exacerbation: Secondary | ICD-10-CM | POA: Diagnosis not present

## 2022-04-03 DIAGNOSIS — I4891 Unspecified atrial fibrillation: Secondary | ICD-10-CM | POA: Diagnosis not present

## 2022-04-03 DIAGNOSIS — I5033 Acute on chronic diastolic (congestive) heart failure: Secondary | ICD-10-CM | POA: Diagnosis not present

## 2022-04-03 DIAGNOSIS — E1122 Type 2 diabetes mellitus with diabetic chronic kidney disease: Secondary | ICD-10-CM | POA: Diagnosis not present

## 2022-04-03 NOTE — Telephone Encounter (Signed)
PT called from patients home. States that she filled her lopressor today and rx was 100 mg daily and she has been taking it BID forever. I advised that we last sent in 03/26/22 and it was '100mg'$  BID. He should contact the pharmacy.

## 2022-04-07 DIAGNOSIS — I5033 Acute on chronic diastolic (congestive) heart failure: Secondary | ICD-10-CM | POA: Diagnosis not present

## 2022-04-07 DIAGNOSIS — K2100 Gastro-esophageal reflux disease with esophagitis, without bleeding: Secondary | ICD-10-CM | POA: Diagnosis not present

## 2022-04-07 DIAGNOSIS — E1122 Type 2 diabetes mellitus with diabetic chronic kidney disease: Secondary | ICD-10-CM | POA: Diagnosis not present

## 2022-04-07 DIAGNOSIS — Z7901 Long term (current) use of anticoagulants: Secondary | ICD-10-CM | POA: Diagnosis not present

## 2022-04-07 DIAGNOSIS — I130 Hypertensive heart and chronic kidney disease with heart failure and stage 1 through stage 4 chronic kidney disease, or unspecified chronic kidney disease: Secondary | ICD-10-CM | POA: Diagnosis not present

## 2022-04-07 DIAGNOSIS — I2510 Atherosclerotic heart disease of native coronary artery without angina pectoris: Secondary | ICD-10-CM | POA: Diagnosis not present

## 2022-04-07 DIAGNOSIS — E569 Vitamin deficiency, unspecified: Secondary | ICD-10-CM | POA: Diagnosis not present

## 2022-04-07 DIAGNOSIS — Z7984 Long term (current) use of oral hypoglycemic drugs: Secondary | ICD-10-CM | POA: Diagnosis not present

## 2022-04-07 DIAGNOSIS — N183 Chronic kidney disease, stage 3 unspecified: Secondary | ICD-10-CM | POA: Diagnosis not present

## 2022-04-07 DIAGNOSIS — J45901 Unspecified asthma with (acute) exacerbation: Secondary | ICD-10-CM | POA: Diagnosis not present

## 2022-04-07 DIAGNOSIS — I4891 Unspecified atrial fibrillation: Secondary | ICD-10-CM | POA: Diagnosis not present

## 2022-04-08 DIAGNOSIS — N183 Chronic kidney disease, stage 3 unspecified: Secondary | ICD-10-CM | POA: Diagnosis not present

## 2022-04-08 DIAGNOSIS — Z7901 Long term (current) use of anticoagulants: Secondary | ICD-10-CM | POA: Diagnosis not present

## 2022-04-08 DIAGNOSIS — E569 Vitamin deficiency, unspecified: Secondary | ICD-10-CM | POA: Diagnosis not present

## 2022-04-08 DIAGNOSIS — I2510 Atherosclerotic heart disease of native coronary artery without angina pectoris: Secondary | ICD-10-CM | POA: Diagnosis not present

## 2022-04-08 DIAGNOSIS — J45901 Unspecified asthma with (acute) exacerbation: Secondary | ICD-10-CM | POA: Diagnosis not present

## 2022-04-08 DIAGNOSIS — E1122 Type 2 diabetes mellitus with diabetic chronic kidney disease: Secondary | ICD-10-CM | POA: Diagnosis not present

## 2022-04-08 DIAGNOSIS — I130 Hypertensive heart and chronic kidney disease with heart failure and stage 1 through stage 4 chronic kidney disease, or unspecified chronic kidney disease: Secondary | ICD-10-CM | POA: Diagnosis not present

## 2022-04-08 DIAGNOSIS — I5033 Acute on chronic diastolic (congestive) heart failure: Secondary | ICD-10-CM | POA: Diagnosis not present

## 2022-04-08 DIAGNOSIS — Z7984 Long term (current) use of oral hypoglycemic drugs: Secondary | ICD-10-CM | POA: Diagnosis not present

## 2022-04-08 DIAGNOSIS — K2100 Gastro-esophageal reflux disease with esophagitis, without bleeding: Secondary | ICD-10-CM | POA: Diagnosis not present

## 2022-04-08 DIAGNOSIS — I4891 Unspecified atrial fibrillation: Secondary | ICD-10-CM | POA: Diagnosis not present

## 2022-04-11 DIAGNOSIS — N183 Chronic kidney disease, stage 3 unspecified: Secondary | ICD-10-CM | POA: Diagnosis not present

## 2022-04-11 DIAGNOSIS — I130 Hypertensive heart and chronic kidney disease with heart failure and stage 1 through stage 4 chronic kidney disease, or unspecified chronic kidney disease: Secondary | ICD-10-CM | POA: Diagnosis not present

## 2022-04-11 DIAGNOSIS — Z7901 Long term (current) use of anticoagulants: Secondary | ICD-10-CM | POA: Diagnosis not present

## 2022-04-11 DIAGNOSIS — I2510 Atherosclerotic heart disease of native coronary artery without angina pectoris: Secondary | ICD-10-CM | POA: Diagnosis not present

## 2022-04-11 DIAGNOSIS — K2100 Gastro-esophageal reflux disease with esophagitis, without bleeding: Secondary | ICD-10-CM | POA: Diagnosis not present

## 2022-04-11 DIAGNOSIS — I4891 Unspecified atrial fibrillation: Secondary | ICD-10-CM | POA: Diagnosis not present

## 2022-04-11 DIAGNOSIS — I5033 Acute on chronic diastolic (congestive) heart failure: Secondary | ICD-10-CM | POA: Diagnosis not present

## 2022-04-11 DIAGNOSIS — J45901 Unspecified asthma with (acute) exacerbation: Secondary | ICD-10-CM | POA: Diagnosis not present

## 2022-04-11 DIAGNOSIS — E569 Vitamin deficiency, unspecified: Secondary | ICD-10-CM | POA: Diagnosis not present

## 2022-04-11 DIAGNOSIS — E1122 Type 2 diabetes mellitus with diabetic chronic kidney disease: Secondary | ICD-10-CM | POA: Diagnosis not present

## 2022-04-11 DIAGNOSIS — Z7984 Long term (current) use of oral hypoglycemic drugs: Secondary | ICD-10-CM | POA: Diagnosis not present

## 2022-04-14 DIAGNOSIS — N183 Chronic kidney disease, stage 3 unspecified: Secondary | ICD-10-CM | POA: Diagnosis not present

## 2022-04-14 DIAGNOSIS — E569 Vitamin deficiency, unspecified: Secondary | ICD-10-CM | POA: Diagnosis not present

## 2022-04-14 DIAGNOSIS — I130 Hypertensive heart and chronic kidney disease with heart failure and stage 1 through stage 4 chronic kidney disease, or unspecified chronic kidney disease: Secondary | ICD-10-CM | POA: Diagnosis not present

## 2022-04-14 DIAGNOSIS — Z7984 Long term (current) use of oral hypoglycemic drugs: Secondary | ICD-10-CM | POA: Diagnosis not present

## 2022-04-14 DIAGNOSIS — I2510 Atherosclerotic heart disease of native coronary artery without angina pectoris: Secondary | ICD-10-CM | POA: Diagnosis not present

## 2022-04-14 DIAGNOSIS — K2100 Gastro-esophageal reflux disease with esophagitis, without bleeding: Secondary | ICD-10-CM | POA: Diagnosis not present

## 2022-04-14 DIAGNOSIS — I5033 Acute on chronic diastolic (congestive) heart failure: Secondary | ICD-10-CM | POA: Diagnosis not present

## 2022-04-14 DIAGNOSIS — J45901 Unspecified asthma with (acute) exacerbation: Secondary | ICD-10-CM | POA: Diagnosis not present

## 2022-04-14 DIAGNOSIS — Z7901 Long term (current) use of anticoagulants: Secondary | ICD-10-CM | POA: Diagnosis not present

## 2022-04-14 DIAGNOSIS — E1122 Type 2 diabetes mellitus with diabetic chronic kidney disease: Secondary | ICD-10-CM | POA: Diagnosis not present

## 2022-04-14 DIAGNOSIS — I4891 Unspecified atrial fibrillation: Secondary | ICD-10-CM | POA: Diagnosis not present

## 2022-04-14 NOTE — Progress Notes (Unsigned)
Patient ID: Victoria Dalton, female    DOB: [birth year], 88 y.o.   MRN: [id number]  HPI  Ms Grulke is a 88 y/o female with a history of DM, hyperlipidemia, HTN, CKD, GERD, kidney stone, UTI and chronic heart failure.   Echo report from 03/09/22 reviewed and showed an EF of 50-55% along with moderate LVH and moderate MR.   Admitted 03/08/22 due to SOB on exertion. Found to have new onset AF along with pulmonary edema on CXR. Cardiology consult obtained. Discharged after 4 days.   She presents today for a follow-up visit with a chief complaint of moderate fatigue with minimal exertion. Describes this as chronic in nature having been present for several years. She has associated shortness of breath and weight loss along with this. She denies any difficulty sleeping, dizziness, abdominal distention, palpitations, pedal edema, chest pain, wheezing, cough or weight gain.   Trying to watch her sodium content closely and has changed the way she eats. Didn't realize that she was taking her metoprolol succinate BID as the home health nurse told her she needed to take it BID (had previously been taking metoprolol tartrate BID).   Past Medical History:  Diagnosis Date   CHF (congestive heart failure) (HCC)    Diabetes mellitus without complication (HCC)    Dyspnea    Gallbladder calculus    GERD (gastroesophageal reflux disease)    Hyperlipidemia    Hypertension    Hypertensive retinopathy    OU   Kidney stone    Macular degeneration    Dry OU   Mass    Parotid swelling    UTI (lower urinary tract infection)    Past Surgical History:  Procedure Laterality Date   CATARACT EXTRACTION Bilateral 02/2020   Dr. Vevelyn Royals   CHOLECYSTECTOMY N/A 04/30/2016   Procedure: CHOLECYSTECTOMY;  Surgeon: Jules Husbands, MD;  Location: [facility] ORS;  Service: General;  Laterality: N/A;   DIAGNOSTIC LAPAROSCOPIC LIVER BIOPSY  02/28/2016   Procedure: DIAGNOSTIC LAPAROSCOPIC LIVER BIOPSY;  Surgeon: Jules Husbands, MD;  Location: [facility] OR[facility];  Service: General;;   ERCP N/A 01/13/2016   Procedure: ENDOSCOPIC RETROGRADE CHOLANGIOPANCREATOGRAPHY (ERCP);  Surgeon: Lucilla Lame, MD;  Location: [facility];  Service: Endoscopy;  Laterality: N/A;   ERCP N/A 04/08/2016   Procedure: ENDOSCOPIC RETROGRADE CHOLANGIOPANCREATOGRAPHY (ERCP) Stent removal;  Surgeon: Lucilla Lame, MD;  Location: [facility] ENDOSCOPY;  Service: Endoscopy;  Laterality: N/A;   EYE SURGERY Bilateral 02/2020   Cat Sx - Dr. Vevelyn Royals   INTRAOPERATIVE CHOLANGIOGRAM  04/30/2016   Procedure: INTRAOPERATIVE CHOLANGIOGRAM;  Surgeon: Jules Husbands, MD;  Location: [facility] ORS;  Service: General;;   KNEE ARTHROSCOPY Left    LAPAROSCOPY  02/28/2016   Procedure: Diagnotic laparoscopy with omental biopsy;  Surgeon: Jules Husbands, MD;  Location: [facility] ORS;  Service: General;;   TONSILLECTOMY     Family History  Problem Relation Age of Onset   Cancer Father    Heart attack Father    Cancer Brother    Diabetes Brother    Diabetes Son    Breast cancer Neg Hx    Social History   Tobacco Use   Smoking status: Never   Smokeless tobacco: Never  Substance Use Topics   Alcohol use: No   Allergies  Allergen Reactions   Chlorhexidine Itching   Ramipril Other (See Comments)    IRREGULAR HEART BEAT   Penicillins Rash    Has patient had a PCN reaction causing immediate rash, facial/tongue/throat  swelling, SOB or lightheadedness with hypotension: Yes Has patient had a PCN reaction causing severe rash involving mucus membranes or skin necrosis: Yes Has patient had a PCN reaction that required hospitalization No Has patient had a PCN reaction occurring within the last 10 years: No If all of the above answers are "NO", then may proceed with Cephalosporin use.    Poison Oak Extract Rash   Sulfa Antibiotics Rash   Prior to Admission medications   Medication Sig Start Date End Date Taking? Authorizing Provider  acetaminophen (TYLENOL) 500 MG tablet  Take 500 mg by mouth every 6 (six) hours as needed for mild pain.   Yes [provider]  apixaban (ELIQUIS) 5 MG TABS tablet Take 1 tablet (5 mg total) by mouth 2 (two) times daily. 03/26/22 04/25/22 Yes Masoud, Viann Shove, MD  carboxymethylcellulose (REFRESH PLUS) 0.5 % SOLN 1 drop 3 (three) times daily as needed.   Yes [provider]  conjugated estrogens (PREMARIN) vaginal cream Place 1 Applicatorful vaginally 2 (two) times a week. 1 gram vaginally at bedtime twice weekly 03/27/22  Yes Masoud, Viann Shove, MD  diltiazem (CARDIZEM CD) 240 MG 24 hr capsule Take 1 capsule (240 mg total) by mouth daily. 03/26/22  Yes Masoud, Viann Shove, MD  glimepiride (AMARYL) 4 MG tablet TAKE 1 TABLET(4 MG) BY MOUTH DAILY BEFORE BREAKFAST 03/26/22  Yes Masoud, Viann Shove, MD  losartan (COZAAR) 25 MG tablet Take 1 tablet (25 mg total) by mouth daily. 04/15/22  Yes Darylene Price A, FNP  metoprolol succinate (TOPROL-XL) 100 MG 24 hr tablet Take 100 mg by mouth 2 (two) times daily. Take with or immediately following a meal.   Yes [provider]  Multiple Vitamins-Minerals (PRESERVISION AREDS 2 PO) Take by mouth.   Yes [provider]  ONE TOUCH ULTRA TEST test strip  11/02/15  Yes [provider]  pantoprazole (PROTONIX) 40 MG tablet TAKE 1 TABLET(40 MG) BY MOUTH DAILY 03/26/22  Yes Masoud, Viann Shove, MD  simvastatin (ZOCOR) 40 MG tablet Take 1 tablet (40 mg total) by mouth daily. 03/26/22  Yes Masoud, Viann Shove, MD  furosemide (LASIX) 40 MG tablet Take 1 tablet (40 mg total) by mouth daily. 04/15/22   Alisa Graff, FNP    Review of Systems  Constitutional:  Positive for fatigue (easily). Negative for appetite change.  HENT:  Negative for congestion, postnasal drip and sore throat.   Eyes: Negative.   Respiratory:  Positive for shortness of breath. Negative for cough, chest tightness and wheezing.   Cardiovascular:  Negative for chest pain, palpitations and leg swelling.  Gastrointestinal:  Negative for  abdominal distention and abdominal pain.  Endocrine: Negative.   Genitourinary: Negative.   Musculoskeletal:  Negative for back pain and neck pain.  Skin: Negative.   Allergic/Immunologic: Negative.   Neurological:  Negative for dizziness and light-headedness.  Hematological:  Negative for adenopathy. Does not bruise/bleed easily.  Psychiatric/Behavioral:  Negative for dysphoric mood and sleep disturbance (sleeping on 1 pillow). The patient is not nervous/anxious.    Vitals:   04/15/22 0937  BP: 129/79  Pulse: 85  Resp: 18  SpO2: 92%  Weight: 256 lb (116.1 kg)   Wt Readings from Last 3 Encounters:  04/15/22 256 lb (116.1 kg)  03/26/22 260 lb 14.4 oz (118.3 kg)  03/19/22 267 lb 4 oz (121.2 kg)   Lab Results  Component Value Date   CREATININE 1.07 (H) 03/12/2022   CREATININE 1.18 (H) 03/11/2022   CREATININE 1.25 (H) 03/10/2022   Physical Exam  Vitals and nursing note reviewed. Exam conducted with a chaperone present (daughter).  Constitutional:      Appearance: Normal appearance.  HENT:     Head: Normocephalic and atraumatic.  Cardiovascular:     Rate and Rhythm: Normal rate and regular rhythm.  Pulmonary:     Effort: Pulmonary effort is normal. No respiratory distress.     Breath sounds: No wheezing or rales.  Abdominal:     General: There is no distension.     Palpations: Abdomen is soft.     Tenderness: There is no abdominal tenderness.  Musculoskeletal:        General: No tenderness.     Cervical back: Normal range of motion and neck supple.     Right lower leg: No tenderness. No edema.     Left lower leg: No tenderness. No edema.  Skin:    General: Skin is warm and dry.  Neurological:     General: No focal deficit present.     Mental Status: She is alert and oriented to person, place, and time.  Psychiatric:        Mood and Affect: Mood normal.        Behavior: Behavior normal.        Thought Content: Thought content normal.   Assessment & Plan:  1:  Chronic heart failure with preserved ejection fraction with structural changes (LVH)- - NYHA class III - euvolemic today - weighing daily now that she's at home; reminded to call for an overnight weight gain of > 2 pounds or a weekly weight gain of > 5 pounds - weight down 11 pounds from last visit here 1 month ago - not adding salt to her food and has been trying to follow a low sodium diet - unsure of fluid intake; reviewed the importance of keeping daily fluid intake to 60-64 ounces daily and this includes anything that is liquid at room temperature - has a history of UTI's so not a good candidate for SGLT2 - decrease metoprolol succinate back to '100mg'$  once daily - resume losartan but at '25mg'$  dose - check BMP at next visit - BNP 03/08/22 was 495.3 - has received her flu vaccine for the season - PharmD reconciled medications with the patient  2: HTN- - BP looks good (129/79) - saw PCP (Masoud) 03/26/22 - BMP 03/12/22 reviewed and showed sodium 138, potassium 3.9, creatinine 1.07 & GFR 50  3: DM- - A1c 03/03/22 was 8.1% - home glucose today was 153  4: Atrial fibrillation- - currently rate controlled - sees cardiology Clayborn Bigness) later today

## 2022-04-15 ENCOUNTER — Ambulatory Visit: Payer: Medicare Other | Attending: Family | Admitting: Family

## 2022-04-15 ENCOUNTER — Encounter: Payer: Self-pay | Admitting: Family

## 2022-04-15 ENCOUNTER — Encounter: Payer: Self-pay | Admitting: Pharmacist

## 2022-04-15 VITALS — BP 129/79 | HR 85 | Resp 18 | Wt 256.0 lb

## 2022-04-15 DIAGNOSIS — N189 Chronic kidney disease, unspecified: Secondary | ICD-10-CM | POA: Insufficient documentation

## 2022-04-15 DIAGNOSIS — I4891 Unspecified atrial fibrillation: Secondary | ICD-10-CM | POA: Insufficient documentation

## 2022-04-15 DIAGNOSIS — I5032 Chronic diastolic (congestive) heart failure: Secondary | ICD-10-CM | POA: Diagnosis not present

## 2022-04-15 DIAGNOSIS — E1122 Type 2 diabetes mellitus with diabetic chronic kidney disease: Secondary | ICD-10-CM | POA: Insufficient documentation

## 2022-04-15 DIAGNOSIS — K219 Gastro-esophageal reflux disease without esophagitis: Secondary | ICD-10-CM | POA: Diagnosis not present

## 2022-04-15 DIAGNOSIS — Z87442 Personal history of urinary calculi: Secondary | ICD-10-CM | POA: Insufficient documentation

## 2022-04-15 DIAGNOSIS — E119 Type 2 diabetes mellitus without complications: Secondary | ICD-10-CM | POA: Diagnosis not present

## 2022-04-15 DIAGNOSIS — Z79899 Other long term (current) drug therapy: Secondary | ICD-10-CM | POA: Insufficient documentation

## 2022-04-15 DIAGNOSIS — I480 Paroxysmal atrial fibrillation: Secondary | ICD-10-CM | POA: Diagnosis not present

## 2022-04-15 DIAGNOSIS — I130 Hypertensive heart and chronic kidney disease with heart failure and stage 1 through stage 4 chronic kidney disease, or unspecified chronic kidney disease: Secondary | ICD-10-CM | POA: Diagnosis not present

## 2022-04-15 DIAGNOSIS — I10 Essential (primary) hypertension: Secondary | ICD-10-CM

## 2022-04-15 DIAGNOSIS — Z87440 Personal history of urinary (tract) infections: Secondary | ICD-10-CM | POA: Diagnosis not present

## 2022-04-15 DIAGNOSIS — E785 Hyperlipidemia, unspecified: Secondary | ICD-10-CM | POA: Diagnosis not present

## 2022-04-15 DIAGNOSIS — N182 Chronic kidney disease, stage 2 (mild): Secondary | ICD-10-CM | POA: Diagnosis not present

## 2022-04-15 MED ORDER — LOSARTAN POTASSIUM 25 MG PO TABS: 25.0000 mg | ORAL_TABLET | Freq: Every day | ORAL | 5 refills | Status: DC

## 2022-04-15 MED ORDER — FUROSEMIDE 40 MG PO TABS: 40.0000 mg | ORAL_TABLET | Freq: Every day | ORAL | 3 refills | Status: DC

## 2022-04-15 NOTE — Patient Instructions (Addendum)
Continue weighing daily and call for an overnight weight gain of 3 pounds or more or a weekly weight gain of more than 5 pounds.   If you have voicemail, please make sure your mailbox is cleaned out so that we may leave a message and please make sure to listen to any voicemails.    Decrease metoprolol succinate back to once daily   Start taking losartan '25mg'$  once daily.    Make sure you are drinking about 60 ounces of fluids daily.

## 2022-04-16 DIAGNOSIS — I4891 Unspecified atrial fibrillation: Secondary | ICD-10-CM | POA: Diagnosis not present

## 2022-04-16 DIAGNOSIS — I2510 Atherosclerotic heart disease of native coronary artery without angina pectoris: Secondary | ICD-10-CM | POA: Diagnosis not present

## 2022-04-16 DIAGNOSIS — E1122 Type 2 diabetes mellitus with diabetic chronic kidney disease: Secondary | ICD-10-CM | POA: Diagnosis not present

## 2022-04-16 DIAGNOSIS — Z7984 Long term (current) use of oral hypoglycemic drugs: Secondary | ICD-10-CM | POA: Diagnosis not present

## 2022-04-16 DIAGNOSIS — I5033 Acute on chronic diastolic (congestive) heart failure: Secondary | ICD-10-CM | POA: Diagnosis not present

## 2022-04-16 DIAGNOSIS — I130 Hypertensive heart and chronic kidney disease with heart failure and stage 1 through stage 4 chronic kidney disease, or unspecified chronic kidney disease: Secondary | ICD-10-CM | POA: Diagnosis not present

## 2022-04-16 DIAGNOSIS — E569 Vitamin deficiency, unspecified: Secondary | ICD-10-CM | POA: Diagnosis not present

## 2022-04-16 DIAGNOSIS — K2100 Gastro-esophageal reflux disease with esophagitis, without bleeding: Secondary | ICD-10-CM | POA: Diagnosis not present

## 2022-04-16 DIAGNOSIS — Z7901 Long term (current) use of anticoagulants: Secondary | ICD-10-CM | POA: Diagnosis not present

## 2022-04-16 DIAGNOSIS — N183 Chronic kidney disease, stage 3 unspecified: Secondary | ICD-10-CM | POA: Diagnosis not present

## 2022-04-16 DIAGNOSIS — J45901 Unspecified asthma with (acute) exacerbation: Secondary | ICD-10-CM | POA: Diagnosis not present

## 2022-04-18 DIAGNOSIS — I4891 Unspecified atrial fibrillation: Secondary | ICD-10-CM | POA: Diagnosis not present

## 2022-04-18 DIAGNOSIS — N183 Chronic kidney disease, stage 3 unspecified: Secondary | ICD-10-CM | POA: Diagnosis not present

## 2022-04-18 DIAGNOSIS — I2510 Atherosclerotic heart disease of native coronary artery without angina pectoris: Secondary | ICD-10-CM | POA: Diagnosis not present

## 2022-04-18 DIAGNOSIS — Z7901 Long term (current) use of anticoagulants: Secondary | ICD-10-CM | POA: Diagnosis not present

## 2022-04-18 DIAGNOSIS — K2100 Gastro-esophageal reflux disease with esophagitis, without bleeding: Secondary | ICD-10-CM | POA: Diagnosis not present

## 2022-04-18 DIAGNOSIS — E1122 Type 2 diabetes mellitus with diabetic chronic kidney disease: Secondary | ICD-10-CM | POA: Diagnosis not present

## 2022-04-18 DIAGNOSIS — E569 Vitamin deficiency, unspecified: Secondary | ICD-10-CM | POA: Diagnosis not present

## 2022-04-18 DIAGNOSIS — I5033 Acute on chronic diastolic (congestive) heart failure: Secondary | ICD-10-CM | POA: Diagnosis not present

## 2022-04-18 DIAGNOSIS — J45901 Unspecified asthma with (acute) exacerbation: Secondary | ICD-10-CM | POA: Diagnosis not present

## 2022-04-18 DIAGNOSIS — I130 Hypertensive heart and chronic kidney disease with heart failure and stage 1 through stage 4 chronic kidney disease, or unspecified chronic kidney disease: Secondary | ICD-10-CM | POA: Diagnosis not present

## 2022-04-18 DIAGNOSIS — Z7984 Long term (current) use of oral hypoglycemic drugs: Secondary | ICD-10-CM | POA: Diagnosis not present

## 2022-04-20 ENCOUNTER — Other Ambulatory Visit: Payer: Self-pay | Admitting: Internal Medicine

## 2022-04-22 DIAGNOSIS — J45901 Unspecified asthma with (acute) exacerbation: Secondary | ICD-10-CM | POA: Diagnosis not present

## 2022-04-22 DIAGNOSIS — E1122 Type 2 diabetes mellitus with diabetic chronic kidney disease: Secondary | ICD-10-CM | POA: Diagnosis not present

## 2022-04-22 DIAGNOSIS — I2510 Atherosclerotic heart disease of native coronary artery without angina pectoris: Secondary | ICD-10-CM | POA: Diagnosis not present

## 2022-04-22 DIAGNOSIS — N183 Chronic kidney disease, stage 3 unspecified: Secondary | ICD-10-CM | POA: Diagnosis not present

## 2022-04-22 DIAGNOSIS — I4891 Unspecified atrial fibrillation: Secondary | ICD-10-CM | POA: Diagnosis not present

## 2022-04-22 DIAGNOSIS — E569 Vitamin deficiency, unspecified: Secondary | ICD-10-CM | POA: Diagnosis not present

## 2022-04-22 DIAGNOSIS — I130 Hypertensive heart and chronic kidney disease with heart failure and stage 1 through stage 4 chronic kidney disease, or unspecified chronic kidney disease: Secondary | ICD-10-CM | POA: Diagnosis not present

## 2022-04-22 DIAGNOSIS — K2100 Gastro-esophageal reflux disease with esophagitis, without bleeding: Secondary | ICD-10-CM | POA: Diagnosis not present

## 2022-04-22 DIAGNOSIS — Z7984 Long term (current) use of oral hypoglycemic drugs: Secondary | ICD-10-CM | POA: Diagnosis not present

## 2022-04-22 DIAGNOSIS — Z7901 Long term (current) use of anticoagulants: Secondary | ICD-10-CM | POA: Diagnosis not present

## 2022-04-22 DIAGNOSIS — I5033 Acute on chronic diastolic (congestive) heart failure: Secondary | ICD-10-CM | POA: Diagnosis not present

## 2022-04-23 NOTE — Progress Notes (Signed)
Patient ID: Victoria Dalton, female   DOB: [birth year], 88 y.o.   MRN: [id number]  [facility] - HEART FAILURE CLINIC - PHARMACIST COUNSELING NOTE  *HFpEF*  Guideline-Directed Medical Therapy/Evidence Based Medicine  ACE/ARB/ARNI:  HELD Beta Blocker: Metoprolol succinate 100 mg daily twice daily Aldosterone Antagonist:  none Diuretic: Furosemide 40 mg daily SGLT2i:  none d/t recurrent UTI  Adherence Assessment  Do you ever forget to take your medication? [] Yes [x] No  Do you ever skip doses due to side effects? [] Yes [x] No  Do you have trouble affording your medicines? [] Yes [x] No  Are you ever unable to pick up your medication due to transportation difficulties? [] Yes [x] No  Do you ever stop taking your medications because you don't believe they are helping? [] Yes [x] No  Do you check your weight daily? [x] Yes [] No   Adherence strategy: pill box and med list  Barriers to obtaining medications: none  Vital signs: HR 85, BP 129/79, weight (pounds) 256 lbs  ECHO: Date 03/09/2022, EF 50055%, notes moderate LVH and moderate MR       Latest Ref Rng & Units 03/12/2022    4:45 AM 03/11/2022    4:30 AM 03/10/2022    5:14 AM  BMP  Glucose 70 - 99 mg/dL 841  324  401   BUN 8 - 23 mg/dL 33  25  23   Creatinine 0.44 - 1.00 mg/dL 0.27  2.53  6.64   Sodium 135 - 145 mmol/L 138  136  136   Potassium 3.5 - 5.1 mmol/L 3.9  4.0  4.1   Chloride 98 - 111 mmol/L 100  98  98   CO2 22 - 32 mmol/L 29  28  27    Calcium 8.9 - 10.3 mg/dL 8.7  8.9  8.8     Past Medical History:  Diagnosis Date   CHF (congestive heart failure) (HCC)    Diabetes mellitus without complication (HCC)    Dyspnea    Gallbladder calculus    GERD (gastroesophageal reflux disease)    Hyperlipidemia    Hypertension    Hypertensive retinopathy    OU   Kidney stone    Macular degeneration    Dry OU   Mass    Parotid swelling    UTI (lower urinary tract infection)     ASSESSMENT 88  year old female who presents to the HF clinic follow up. PMH includes DM, hyperlipidemia, HTN, CKD, GERD, kidney stone,recurrent UTI and chronic heart failure. Last acute care admission was 03/08/22 due to increased SOB on exertion.  Patient presents to clinic accompany by daughter. Medication reconciliation was completed. Patient was instructed tin increase her metoprolol succinate from daily to BID by home health nurse. Losartan was HELD due to non-symptomatic hypotension.  PLAN Decrease metoprolol succinate to 100mg  daily D/C losartan 100mg  Start losartan 25 mg daily  Time spent: 15 minutes Clark Clowdus Rodriguez-Guzman PharmD, BCPS 04/23/2022 2:47 PM   Current Outpatient Medications:    acetaminophen (TYLENOL) 500 MG tablet, Take 500 mg by mouth every 6 (six) hours as needed for mild pain., Disp: , Rfl:    apixaban (ELIQUIS) 5 MG TABS tablet, Take 1 tablet (5 mg total) by mouth 2 (two) times daily., Disp: 60 tablet, Rfl: 0   carboxymethylcellulose (REFRESH PLUS) 0.5 % SOLN, 1 drop 3 (three) times daily as needed., Disp: , Rfl:    conjugated estrogens (PREMARIN) vaginal cream, Place 1 Applicatorful vaginally 2 (two) times a week. 1 gram vaginally at bedtime twice  weekly, Disp: 30 g, Rfl: 3   diltiazem (CARDIZEM CD) 240 MG 24 hr capsule, Take 1 capsule (240 mg total) by mouth daily., Disp: 90 capsule, Rfl: 3   furosemide (LASIX) 40 MG tablet, Take 1 tablet (40 mg total) by mouth daily., Disp: 90 tablet, Rfl: 3   glimepiride (AMARYL) 4 MG tablet, TAKE 1 TABLET(4 MG) BY MOUTH DAILY BEFORE BREAKFAST, Disp: 90 tablet, Rfl: 3   glucose blood (ONETOUCH ULTRA) test strip, USE AS DIRECTED, Disp: 100 strip, Rfl: 3   losartan (COZAAR) 25 MG tablet, Take 1 tablet (25 mg total) by mouth daily., Disp: 30 tablet, Rfl: 5   metoprolol succinate (TOPROL-XL) 100 MG 24 hr tablet, Take 100 mg by mouth daily. Take with or immediately following a meal., Disp: , Rfl:    Multiple Vitamins-Minerals (PRESERVISION AREDS 2  PO), Take by mouth., Disp: , Rfl:    pantoprazole (PROTONIX) 40 MG tablet, TAKE 1 TABLET(40 MG) BY MOUTH DAILY, Disp: 90 tablet, Rfl: 3   simvastatin (ZOCOR) 40 MG tablet, Take 1 tablet (40 mg total) by mouth daily., Disp: 90 tablet, Rfl: 3   MEDICATION ADHERENCES TIPS AND STRATEGIES Taking medication as prescribed improves patient outcomes in heart failure (reduces hospitalizations, improves symptoms, increases survival) Side effects of medications can be managed by decreasing doses, switching agents, stopping drugs, or adding additional therapy. Please let someone in the Heart Failure Clinic know if you have having bothersome side effects so we can modify your regimen. Do not alter your medication regimen without talking to [place].  Medication reminders can help patients remember to take drugs on time. If you are missing or forgetting doses you can try linking behaviors, using pill boxes, or an electronic reminder like an alarm on your phone or an app. Some people can also get automated phone calls as medication reminders.

## 2022-04-25 DIAGNOSIS — I4891 Unspecified atrial fibrillation: Secondary | ICD-10-CM | POA: Diagnosis not present

## 2022-04-25 DIAGNOSIS — I5033 Acute on chronic diastolic (congestive) heart failure: Secondary | ICD-10-CM | POA: Diagnosis not present

## 2022-04-25 DIAGNOSIS — I2510 Atherosclerotic heart disease of native coronary artery without angina pectoris: Secondary | ICD-10-CM | POA: Diagnosis not present

## 2022-04-25 DIAGNOSIS — E569 Vitamin deficiency, unspecified: Secondary | ICD-10-CM | POA: Diagnosis not present

## 2022-04-25 DIAGNOSIS — J45901 Unspecified asthma with (acute) exacerbation: Secondary | ICD-10-CM | POA: Diagnosis not present

## 2022-04-25 DIAGNOSIS — E1122 Type 2 diabetes mellitus with diabetic chronic kidney disease: Secondary | ICD-10-CM | POA: Diagnosis not present

## 2022-04-25 DIAGNOSIS — Z7901 Long term (current) use of anticoagulants: Secondary | ICD-10-CM | POA: Diagnosis not present

## 2022-04-25 DIAGNOSIS — N183 Chronic kidney disease, stage 3 unspecified: Secondary | ICD-10-CM | POA: Diagnosis not present

## 2022-04-25 DIAGNOSIS — I130 Hypertensive heart and chronic kidney disease with heart failure and stage 1 through stage 4 chronic kidney disease, or unspecified chronic kidney disease: Secondary | ICD-10-CM | POA: Diagnosis not present

## 2022-04-25 DIAGNOSIS — K2100 Gastro-esophageal reflux disease with esophagitis, without bleeding: Secondary | ICD-10-CM | POA: Diagnosis not present

## 2022-04-25 DIAGNOSIS — Z7984 Long term (current) use of oral hypoglycemic drugs: Secondary | ICD-10-CM | POA: Diagnosis not present

## 2022-04-29 DIAGNOSIS — E569 Vitamin deficiency, unspecified: Secondary | ICD-10-CM | POA: Diagnosis not present

## 2022-04-29 DIAGNOSIS — I4891 Unspecified atrial fibrillation: Secondary | ICD-10-CM | POA: Diagnosis not present

## 2022-04-29 DIAGNOSIS — E1122 Type 2 diabetes mellitus with diabetic chronic kidney disease: Secondary | ICD-10-CM | POA: Diagnosis not present

## 2022-04-29 DIAGNOSIS — I2510 Atherosclerotic heart disease of native coronary artery without angina pectoris: Secondary | ICD-10-CM | POA: Diagnosis not present

## 2022-04-29 DIAGNOSIS — K2100 Gastro-esophageal reflux disease with esophagitis, without bleeding: Secondary | ICD-10-CM | POA: Diagnosis not present

## 2022-04-29 DIAGNOSIS — J45901 Unspecified asthma with (acute) exacerbation: Secondary | ICD-10-CM | POA: Diagnosis not present

## 2022-04-29 DIAGNOSIS — Z7901 Long term (current) use of anticoagulants: Secondary | ICD-10-CM | POA: Diagnosis not present

## 2022-04-29 DIAGNOSIS — Z7984 Long term (current) use of oral hypoglycemic drugs: Secondary | ICD-10-CM | POA: Diagnosis not present

## 2022-04-29 DIAGNOSIS — I5033 Acute on chronic diastolic (congestive) heart failure: Secondary | ICD-10-CM | POA: Diagnosis not present

## 2022-04-29 DIAGNOSIS — N183 Chronic kidney disease, stage 3 unspecified: Secondary | ICD-10-CM | POA: Diagnosis not present

## 2022-04-29 DIAGNOSIS — I130 Hypertensive heart and chronic kidney disease with heart failure and stage 1 through stage 4 chronic kidney disease, or unspecified chronic kidney disease: Secondary | ICD-10-CM | POA: Diagnosis not present

## 2022-05-02 DIAGNOSIS — I130 Hypertensive heart and chronic kidney disease with heart failure and stage 1 through stage 4 chronic kidney disease, or unspecified chronic kidney disease: Secondary | ICD-10-CM | POA: Diagnosis not present

## 2022-05-02 DIAGNOSIS — E1122 Type 2 diabetes mellitus with diabetic chronic kidney disease: Secondary | ICD-10-CM | POA: Diagnosis not present

## 2022-05-02 DIAGNOSIS — N183 Chronic kidney disease, stage 3 unspecified: Secondary | ICD-10-CM | POA: Diagnosis not present

## 2022-05-02 DIAGNOSIS — J45901 Unspecified asthma with (acute) exacerbation: Secondary | ICD-10-CM | POA: Diagnosis not present

## 2022-05-02 DIAGNOSIS — E569 Vitamin deficiency, unspecified: Secondary | ICD-10-CM | POA: Diagnosis not present

## 2022-05-02 DIAGNOSIS — I2510 Atherosclerotic heart disease of native coronary artery without angina pectoris: Secondary | ICD-10-CM | POA: Diagnosis not present

## 2022-05-02 DIAGNOSIS — Z7984 Long term (current) use of oral hypoglycemic drugs: Secondary | ICD-10-CM | POA: Diagnosis not present

## 2022-05-02 DIAGNOSIS — Z7901 Long term (current) use of anticoagulants: Secondary | ICD-10-CM | POA: Diagnosis not present

## 2022-05-02 DIAGNOSIS — I5033 Acute on chronic diastolic (congestive) heart failure: Secondary | ICD-10-CM | POA: Diagnosis not present

## 2022-05-02 DIAGNOSIS — I4891 Unspecified atrial fibrillation: Secondary | ICD-10-CM | POA: Diagnosis not present

## 2022-05-02 DIAGNOSIS — K2100 Gastro-esophageal reflux disease with esophagitis, without bleeding: Secondary | ICD-10-CM | POA: Diagnosis not present

## 2022-05-07 DIAGNOSIS — N183 Chronic kidney disease, stage 3 unspecified: Secondary | ICD-10-CM | POA: Diagnosis not present

## 2022-05-07 DIAGNOSIS — I5033 Acute on chronic diastolic (congestive) heart failure: Secondary | ICD-10-CM | POA: Diagnosis not present

## 2022-05-07 DIAGNOSIS — I2510 Atherosclerotic heart disease of native coronary artery without angina pectoris: Secondary | ICD-10-CM | POA: Diagnosis not present

## 2022-05-07 DIAGNOSIS — Z7984 Long term (current) use of oral hypoglycemic drugs: Secondary | ICD-10-CM | POA: Diagnosis not present

## 2022-05-07 DIAGNOSIS — Z7901 Long term (current) use of anticoagulants: Secondary | ICD-10-CM | POA: Diagnosis not present

## 2022-05-07 DIAGNOSIS — K2100 Gastro-esophageal reflux disease with esophagitis, without bleeding: Secondary | ICD-10-CM | POA: Diagnosis not present

## 2022-05-07 DIAGNOSIS — E569 Vitamin deficiency, unspecified: Secondary | ICD-10-CM | POA: Diagnosis not present

## 2022-05-07 DIAGNOSIS — J45901 Unspecified asthma with (acute) exacerbation: Secondary | ICD-10-CM | POA: Diagnosis not present

## 2022-05-07 DIAGNOSIS — E1122 Type 2 diabetes mellitus with diabetic chronic kidney disease: Secondary | ICD-10-CM | POA: Diagnosis not present

## 2022-05-07 DIAGNOSIS — I4891 Unspecified atrial fibrillation: Secondary | ICD-10-CM | POA: Diagnosis not present

## 2022-05-07 DIAGNOSIS — I130 Hypertensive heart and chronic kidney disease with heart failure and stage 1 through stage 4 chronic kidney disease, or unspecified chronic kidney disease: Secondary | ICD-10-CM | POA: Diagnosis not present

## 2022-05-13 ENCOUNTER — Ambulatory Visit (HOSPITAL_BASED_OUTPATIENT_CLINIC_OR_DEPARTMENT_OTHER): Payer: Medicare Other | Admitting: Family

## 2022-05-13 ENCOUNTER — Other Ambulatory Visit
Admission: RE | Admit: 2022-05-13 | Discharge: 2022-05-13 | Disposition: A | Discharge status: Home / Self Care | Payer: Medicare Other | Source: Ambulatory Visit | Attending: Family | Admitting: Family

## 2022-05-13 ENCOUNTER — Encounter: Payer: Self-pay | Admitting: Family

## 2022-05-13 VITALS — BP 110/65 | HR 82 | Resp 18 | Wt 249.1 lb

## 2022-05-13 DIAGNOSIS — N189 Chronic kidney disease, unspecified: Secondary | ICD-10-CM | POA: Insufficient documentation

## 2022-05-13 DIAGNOSIS — I10 Essential (primary) hypertension: Secondary | ICD-10-CM | POA: Diagnosis not present

## 2022-05-13 DIAGNOSIS — I480 Paroxysmal atrial fibrillation: Secondary | ICD-10-CM | POA: Diagnosis not present

## 2022-05-13 DIAGNOSIS — I130 Hypertensive heart and chronic kidney disease with heart failure and stage 1 through stage 4 chronic kidney disease, or unspecified chronic kidney disease: Secondary | ICD-10-CM | POA: Insufficient documentation

## 2022-05-13 DIAGNOSIS — Z87440 Personal history of urinary (tract) infections: Secondary | ICD-10-CM | POA: Diagnosis not present

## 2022-05-13 DIAGNOSIS — I4891 Unspecified atrial fibrillation: Secondary | ICD-10-CM | POA: Diagnosis not present

## 2022-05-13 DIAGNOSIS — Z87442 Personal history of urinary calculi: Secondary | ICD-10-CM | POA: Insufficient documentation

## 2022-05-13 DIAGNOSIS — N182 Chronic kidney disease, stage 2 (mild): Secondary | ICD-10-CM

## 2022-05-13 DIAGNOSIS — Z7984 Long term (current) use of oral hypoglycemic drugs: Secondary | ICD-10-CM | POA: Insufficient documentation

## 2022-05-13 DIAGNOSIS — I5032 Chronic diastolic (congestive) heart failure: Secondary | ICD-10-CM | POA: Diagnosis not present

## 2022-05-13 DIAGNOSIS — Z79899 Other long term (current) drug therapy: Secondary | ICD-10-CM | POA: Diagnosis not present

## 2022-05-13 DIAGNOSIS — E1122 Type 2 diabetes mellitus with diabetic chronic kidney disease: Secondary | ICD-10-CM | POA: Insufficient documentation

## 2022-05-13 DIAGNOSIS — E785 Hyperlipidemia, unspecified: Secondary | ICD-10-CM | POA: Insufficient documentation

## 2022-05-13 DIAGNOSIS — K219 Gastro-esophageal reflux disease without esophagitis: Secondary | ICD-10-CM | POA: Diagnosis not present

## 2022-05-13 LAB — BASIC METABOLIC PANEL
Anion gap: 11 (ref 5–15)
BUN: 31 mg/dL — ABNORMAL HIGH (ref 8–23)
CO2: 25 mmol/L (ref 22–32)
Calcium: 9 mg/dL (ref 8.9–10.3)
Chloride: 101 mmol/L (ref 98–111)
Creatinine, Ser: 1.2 mg/dL — ABNORMAL HIGH (ref 0.44–1.00)
GFR, Estimated: 43 mL/min — ABNORMAL LOW (ref >60)
Glucose, Bld: 184 mg/dL — ABNORMAL HIGH (ref 70–99)
Potassium: 3.5 mmol/L (ref 3.5–5.1)
Sodium: 137 mmol/L (ref 135–145)

## 2022-05-13 NOTE — Patient Instructions (Signed)
Continue weighing daily and call for an overnight weight gain of 3 pounds or more or a weekly weight gain of more than 5 pounds.   If you have voicemail, please make sure your mailbox is cleaned out so that we may leave a message and please make sure to listen to any voicemails.     

## 2022-05-13 NOTE — Progress Notes (Signed)
Patient ID: Victoria Dalton, female    DOB: Oct 01, 1930, 88 y.o.   MRN: [id number]  HPI  Victoria Dalton is a 88 y/o female with a history of DM, hyperlipidemia, HTN, CKD, GERD, kidney stone, UTI and chronic heart failure.   Echo report from 03/09/22 reviewed and showed an EF of 50-55% along with moderate LVH and moderate MR.   Admitted 03/08/22 due to SOB on exertion. Found to have new onset AF along with pulmonary edema on CXR. Cardiology consult obtained. Discharged after 4 days.   She presents today for a follow-up visit with a chief complaint of minimal shortness of breath with moderate exertion. Describes this as chronic in nature. She has associated fatigue and weight loss along with this. She denies any difficulty sleeping, dizziness, abdominal distention, palpitations, pedal edema, chest pain, wheezing, cough or weight gain.   Trying to closely follow a low sodium diet. Overall, she says that she feels very good and is hoping that I don't "rock the boat".   Past Medical History:  Diagnosis Date   CHF (congestive heart failure) (HCC)    Diabetes mellitus without complication (HCC)    Dyspnea    Gallbladder calculus    GERD (gastroesophageal reflux disease)    Hyperlipidemia    Hypertension    Hypertensive retinopathy    OU   Kidney stone    Macular degeneration    Dry OU   Mass    Parotid swelling    UTI (lower urinary tract infection)    Past Surgical History:  Procedure Laterality Date   CATARACT EXTRACTION Bilateral 02/2020   Dr. Tyrone Schimke   CHOLECYSTECTOMY N/A 04/30/2016   Procedure: CHOLECYSTECTOMY;  Surgeon: Leafy Ro, MD;  Location: [facility] ORS;  Service: General;  Laterality: N/A;   DIAGNOSTIC LAPAROSCOPIC LIVER BIOPSY  02/28/2016   Procedure: DIAGNOSTIC LAPAROSCOPIC LIVER BIOPSY;  Surgeon: Leafy Ro, MD;  Location: [facility] ORS;  Service: General;;   ERCP N/A 01/13/2016   Procedure: ENDOSCOPIC RETROGRADE CHOLANGIOPANCREATOGRAPHY (ERCP);  Surgeon: Midge Minium, MD;  Location: [facility] ENDOSCOPY;  Service: Endoscopy;  Laterality: N/A;   ERCP N/A 04/08/2016   Procedure: ENDOSCOPIC RETROGRADE CHOLANGIOPANCREATOGRAPHY (ERCP) Stent removal;  Surgeon: Midge Minium, MD;  Location: [facility] ENDOSCOPY;  Service: Endoscopy;  Laterality: N/A;   EYE SURGERY Bilateral 02/2020   Cat Sx - Dr. Tyrone Schimke   INTRAOPERATIVE CHOLANGIOGRAM  04/30/2016   Procedure: INTRAOPERATIVE CHOLANGIOGRAM;  Surgeon: Leafy Ro, MD;  Location: [facility] ORS;  Service: General;;   KNEE ARTHROSCOPY Left    LAPAROSCOPY  02/28/2016   Procedure: Diagnotic laparoscopy with omental biopsy;  Surgeon: Leafy Ro, MD;  Location: [facility] ORS;  Service: General;;   TONSILLECTOMY     Family History  Problem Relation Age of Onset   Cancer Father    Heart attack Father    Cancer Brother    Diabetes Brother    Diabetes Son    Breast cancer Neg Hx    Social History   Tobacco Use   Smoking status: Never   Smokeless tobacco: Never  Substance Use Topics   Alcohol use: No   Allergies  Allergen Reactions   Chlorhexidine Itching   Ramipril Other (See Comments)    IRREGULAR HEART BEAT   Penicillins Rash    Has patient had a PCN reaction causing immediate rash, facial/tongue/throat swelling, SOB or lightheadedness with hypotension: Yes Has patient had a PCN reaction causing severe rash involving mucus membranes or skin necrosis: Yes Has patient  had a PCN reaction that required hospitalization No Has patient had a PCN reaction occurring within the last 10 years: No If all of the above answers are "NO", then may proceed with Cephalosporin use.    Poison Oak Extract Rash   Sulfa Antibiotics Rash   Prior to Admission medications   Medication Sig Start Date End Date Taking? Authorizing Provider  apixaban (ELIQUIS) 5 MG TABS tablet Take 1 tablet (5 mg total) by mouth 2 (two) times daily. 03/26/22  Yes Masoud, Renda Rolls, MD  carboxymethylcellulose (REFRESH PLUS) 0.5 % SOLN 1 drop 3 (three) times  daily as needed.   Yes [provider]  conjugated estrogens (PREMARIN) vaginal cream Place 1 Applicatorful vaginally 2 (two) times a week. 1 gram vaginally at bedtime twice weekly 03/27/22  Yes Masoud, Renda Rolls, MD  diltiazem (CARDIZEM CD) 240 MG 24 hr capsule Take 1 capsule (240 mg total) by mouth daily. 03/26/22  Yes Masoud, Renda Rolls, MD  furosemide (LASIX) 40 MG tablet Take 1 tablet (40 mg total) by mouth daily. 04/15/22  Yes Kijuan Gallicchio A, FNP  glimepiride (AMARYL) 4 MG tablet TAKE 1 TABLET(4 MG) BY MOUTH DAILY BEFORE BREAKFAST 03/26/22  Yes Masoud, Renda Rolls, MD  glucose blood (ONETOUCH ULTRA) test strip USE AS DIRECTED 04/21/22  Yes Masoud, Renda Rolls, MD  losartan (COZAAR) 25 MG tablet Take 1 tablet (25 mg total) by mouth daily. 04/15/22  Yes Clarisa Kindred A, FNP  metoprolol succinate (TOPROL-XL) 100 MG 24 hr tablet Take 100 mg by mouth daily. Take with or immediately following a meal.   Yes [provider]  Multiple Vitamins-Minerals (PRESERVISION AREDS 2 PO) Take by mouth.   Yes [provider]  pantoprazole (PROTONIX) 40 MG tablet TAKE 1 TABLET(40 MG) BY MOUTH DAILY 03/26/22  Yes Masoud, Renda Rolls, MD  simvastatin (ZOCOR) 40 MG tablet Take 1 tablet (40 mg total) by mouth daily. 03/26/22  Yes Masoud, Renda Rolls, MD  acetaminophen (TYLENOL) 500 MG tablet Take 500 mg by mouth every 6 (six) hours as needed for mild pain. Patient not taking: Reported on 05/13/2022    [provider]   Review of Systems  Constitutional:  Positive for fatigue. Negative for appetite change.  HENT:  Negative for congestion, postnasal drip and sore throat.   Eyes: Negative.   Respiratory:  Positive for shortness of breath (minimal). Negative for cough, chest tightness and wheezing.   Cardiovascular:  Negative for chest pain, palpitations and leg swelling.  Gastrointestinal:  Negative for abdominal distention and abdominal pain.  Endocrine: Negative.   Genitourinary: Negative.   Musculoskeletal:   Negative for back pain and neck pain.  Skin: Negative.   Allergic/Immunologic: Negative.   Neurological:  Negative for dizziness and light-headedness.  Hematological:  Negative for adenopathy. Does not bruise/bleed easily.  Psychiatric/Behavioral:  Negative for dysphoric mood and sleep disturbance (sleeping on 1 pillow). The patient is not nervous/anxious.    Vitals:   05/13/22 0959 05/13/22 1032  BP: 110/65   Pulse: (!) 110 82  Resp: 18   SpO2: 95%   Weight: 249 lb 2 oz (113 kg)    Wt Readings from Last 3 Encounters:  05/13/22 249 lb 2 oz (113 kg)  04/15/22 256 lb (116.1 kg)  03/26/22 260 lb 14.4 oz (118.3 kg)   Lab Results  Component Value Date   CREATININE 1.20 (H) 05/13/2022   CREATININE 1.07 (H) 03/12/2022   CREATININE 1.18 (H) 03/11/2022   Physical Exam Vitals and nursing note reviewed. Exam conducted with a chaperone present (  daughter).  Constitutional:      Appearance: Normal appearance.  HENT:     Head: Normocephalic and atraumatic.  Cardiovascular:     Rate and Rhythm: Normal rate and regular rhythm.  Pulmonary:     Effort: Pulmonary effort is normal. No respiratory distress.     Breath sounds: No wheezing or rales.  Abdominal:     General: There is no distension.     Palpations: Abdomen is soft.     Tenderness: There is no abdominal tenderness.  Musculoskeletal:        General: No tenderness.     Cervical back: Normal range of motion and neck supple.     Right lower leg: No tenderness. No edema.     Left lower leg: No tenderness. No edema.  Skin:    General: Skin is warm and dry.  Neurological:     General: No focal deficit present.     Mental Status: She is alert and oriented to person, place, and time.  Psychiatric:        Mood and Affect: Mood normal.        Behavior: Behavior normal.        Thought Content: Thought content normal.   Assessment & Plan:  1: Chronic heart failure with preserved ejection fraction with structural changes (LVH)- -  NYHA class II - euvolemic today - weighing daily and reports a continued gradual weight loss; reminded to call for an overnight weight gain of > 2 pounds or a weekly weight gain of > 5 pounds - weight down 7 pounds from last visit here 1 month ago - not adding salt to her food and has been trying to follow a low sodium diet - has a history of UTI's so not a good candidate for SGLT2 - check BMP today since losartan resumed at last visit - BNP 03/08/22 was 495.3 - has received her flu vaccine for the season  2: HTN- - BP looks good (110/65) - saw PCP (Masoud) 03/26/22 - BMP 03/12/22 reviewed and showed sodium 138, potassium 3.9, creatinine 1.07 & GFR 50  3: DM- - A1c 03/03/22 was 8.1% - home glucose today was 113  4: Atrial fibrillation- - currently rate controlled - initially HR rate was 100 after walking in to the office but then quickly decreased to 82 - saw cardiology Juliann Pares) 04/15/22   Medication bottles reviewed.   Return in 3 months, sooner if needed.

## 2022-05-21 DIAGNOSIS — I2510 Atherosclerotic heart disease of native coronary artery without angina pectoris: Secondary | ICD-10-CM | POA: Diagnosis not present

## 2022-05-21 DIAGNOSIS — N183 Chronic kidney disease, stage 3 unspecified: Secondary | ICD-10-CM | POA: Diagnosis not present

## 2022-05-21 DIAGNOSIS — Z7984 Long term (current) use of oral hypoglycemic drugs: Secondary | ICD-10-CM | POA: Diagnosis not present

## 2022-05-21 DIAGNOSIS — Z7901 Long term (current) use of anticoagulants: Secondary | ICD-10-CM | POA: Diagnosis not present

## 2022-05-21 DIAGNOSIS — E1122 Type 2 diabetes mellitus with diabetic chronic kidney disease: Secondary | ICD-10-CM | POA: Diagnosis not present

## 2022-05-21 DIAGNOSIS — I5033 Acute on chronic diastolic (congestive) heart failure: Secondary | ICD-10-CM | POA: Diagnosis not present

## 2022-05-21 DIAGNOSIS — I130 Hypertensive heart and chronic kidney disease with heart failure and stage 1 through stage 4 chronic kidney disease, or unspecified chronic kidney disease: Secondary | ICD-10-CM | POA: Diagnosis not present

## 2022-05-21 DIAGNOSIS — J45901 Unspecified asthma with (acute) exacerbation: Secondary | ICD-10-CM | POA: Diagnosis not present

## 2022-05-21 DIAGNOSIS — I4891 Unspecified atrial fibrillation: Secondary | ICD-10-CM | POA: Diagnosis not present

## 2022-05-21 DIAGNOSIS — K2100 Gastro-esophageal reflux disease with esophagitis, without bleeding: Secondary | ICD-10-CM | POA: Diagnosis not present

## 2022-05-21 DIAGNOSIS — E569 Vitamin deficiency, unspecified: Secondary | ICD-10-CM | POA: Diagnosis not present

## 2022-05-27 ENCOUNTER — Other Ambulatory Visit: Payer: Self-pay | Admitting: Internal Medicine

## 2022-05-27 DIAGNOSIS — E119 Type 2 diabetes mellitus without complications: Secondary | ICD-10-CM

## 2022-06-02 DIAGNOSIS — E119 Type 2 diabetes mellitus without complications: Secondary | ICD-10-CM | POA: Diagnosis not present

## 2022-06-02 DIAGNOSIS — H35343 Macular cyst, hole, or pseudohole, bilateral: Secondary | ICD-10-CM | POA: Diagnosis not present

## 2022-06-02 DIAGNOSIS — H04123 Dry eye syndrome of bilateral lacrimal glands: Secondary | ICD-10-CM | POA: Diagnosis not present

## 2022-06-02 DIAGNOSIS — H353131 Nonexudative age-related macular degeneration, bilateral, early dry stage: Secondary | ICD-10-CM | POA: Diagnosis not present

## 2022-06-09 ENCOUNTER — Ambulatory Visit: Payer: Medicare Other | Admitting: Internal Medicine

## 2022-06-23 ENCOUNTER — Encounter: Payer: Self-pay | Admitting: Internal Medicine

## 2022-06-25 ENCOUNTER — Telehealth: Payer: Self-pay

## 2022-06-25 NOTE — Telephone Encounter (Signed)
Pt's daughter [name] left msg on triage, she wants to schedule an appt for pt. Pls call back to schedule appt.

## 2022-07-08 NOTE — Progress Notes (Signed)
[facility] Note  07/14/2022     CHIEF COMPLAINT Patient presents for Retina Follow Up    HISTORY OF PRESENT ILLNESS: Victoria Dalton is a 89 y.o. female who presents to the clinic today for:  HPI     Retina Follow Up   Patient presents with  Dry AMD.  In both eyes.  Severity is moderate.  Duration of 5 months.  Since onset it is stable.  I, the attending physician,  performed the HPI with the patient and updated documentation appropriately.        Comments   Patient feels that the vision is the same. She was in the hospital for afib. She is using Xdemvy OU BID and AT's OU PRN. Her blood sugar was 110 and she unsure of her A1C.       Last edited by Bernarda Caffey, MD on 07/14/2022  7:56 PM.    Pt states her vision is stable, she is using Xdemvy per Dr. Lucita Ferrara for her eyelids  Referring physician: Vevelyn Royals, MD [id number] [place], [place] [address]  HISTORICAL INFORMATION:   Selected notes from the [facility] Referred by Dr. Lucita Ferrara LEE: 02/22/2020 BCVA: OD 20/70, OS 20/70 Ocular Hx- Mac cyst OU, Cataracts OU, Band Keratopathy OU, ARMD OU PMH- DM   CURRENT MEDICATIONS: Current Outpatient Medications (Ophthalmic Drugs)  Medication Sig   carboxymethylcellulose (REFRESH PLUS) 0.5 % SOLN 1 drop 3 (three) times daily as needed.   No current facility-administered medications for this visit. (Ophthalmic Drugs)   Current Outpatient Medications (Other)  Medication Sig   acetaminophen (TYLENOL) 500 MG tablet Take 500 mg by mouth every 6 (six) hours as needed for mild pain. (Patient not taking: Reported on 05/13/2022)   apixaban (ELIQUIS) 5 MG TABS tablet Take 1 tablet (5 mg total) by mouth 2 (two) times daily.   conjugated estrogens (PREMARIN) vaginal cream Place 1 Applicatorful vaginally 2 (two) times a week. 1 gram vaginally at bedtime twice weekly   diltiazem (CARDIZEM CD) 240 MG 24 hr capsule Take 1 capsule  (240 mg total) by mouth daily.   furosemide (LASIX) 40 MG tablet Take 1 tablet (40 mg total) by mouth daily.   glimepiride (AMARYL) 4 MG tablet TAKE 1 TABLET(4 MG) BY MOUTH DAILY BEFORE BREAKFAST   glucose blood (ONETOUCH ULTRA) test strip USE AS DIRECTED   losartan (COZAAR) 25 MG tablet Take 1 tablet (25 mg total) by mouth daily.   metoprolol succinate (TOPROL-XL) 100 MG 24 hr tablet Take 100 mg by mouth daily. Take with or immediately following a meal.   Multiple Vitamins-Minerals (PRESERVISION AREDS 2 PO) Take by mouth.   pantoprazole (PROTONIX) 40 MG tablet TAKE 1 TABLET(40 MG) BY MOUTH DAILY   simvastatin (ZOCOR) 40 MG tablet Take 1 tablet (40 mg total) by mouth daily.   No current facility-administered medications for this visit. (Other)   REVIEW OF SYSTEMS: ROS   Positive for: Gastrointestinal, Endocrine, Eyes Negative for: Constitutional, Neurological, Skin, Genitourinary, Musculoskeletal, HENT, Cardiovascular, Respiratory, Psychiatric, Allergic/Imm, Heme/Lymph Last edited by Annie Paras, COT on 07/14/2022  1:04 PM.     ALLERGIES Allergies  Allergen Reactions   Chlorhexidine Itching   Ramipril Other (See Comments)    IRREGULAR HEART BEAT   Penicillins Rash    Has patient had a PCN reaction causing immediate rash, facial/tongue/throat swelling, SOB or lightheadedness with hypotension: Yes Has patient had a PCN reaction causing severe rash involving mucus membranes  or skin necrosis: Yes Has patient had a PCN reaction that required hospitalization No Has patient had a PCN reaction occurring within the last 10 years: No If all of the above answers are "NO", then may proceed with Cephalosporin use.    Poison Oak Extract Rash   Sulfa Antibiotics Rash   PAST MEDICAL HISTORY Past Medical History:  Diagnosis Date   CHF (congestive heart failure) ([place])    Diabetes mellitus without complication ([place])    Dyspnea    Gallbladder calculus    GERD (gastroesophageal reflux  disease)    Hyperlipidemia    Hypertension    Hypertensive retinopathy    OU   Kidney stone    Macular degeneration    Dry OU   Mass    Parotid swelling    UTI (lower urinary tract infection)    Past Surgical History:  Procedure Laterality Date   CATARACT EXTRACTION Bilateral 02/2020   Dr. Vevelyn Royals   CHOLECYSTECTOMY N/A 04/30/2016   Procedure: CHOLECYSTECTOMY;  Surgeon: Jules Husbands, MD;  Location: [facility] ORS;  Service: General;  Laterality: N/A;   DIAGNOSTIC LAPAROSCOPIC LIVER BIOPSY  02/28/2016   Procedure: DIAGNOSTIC LAPAROSCOPIC LIVER BIOPSY;  Surgeon: Jules Husbands, MD;  Location: [facility] ORS;  Service: General;;   ERCP N/A 01/13/2016   Procedure: ENDOSCOPIC RETROGRADE CHOLANGIOPANCREATOGRAPHY (ERCP);  Surgeon: Lucilla Lame, MD;  Location: [facility] ENDOSCOPY;  Service: Endoscopy;  Laterality: N/A;   ERCP N/A 04/08/2016   Procedure: ENDOSCOPIC RETROGRADE CHOLANGIOPANCREATOGRAPHY (ERCP) Stent removal;  Surgeon: Lucilla Lame, MD;  Location: [facility] ENDOSCOPY;  Service: Endoscopy;  Laterality: N/A;   EYE SURGERY Bilateral 02/2020   Cat Sx - Dr. Vevelyn Royals   INTRAOPERATIVE CHOLANGIOGRAM  04/30/2016   Procedure: INTRAOPERATIVE CHOLANGIOGRAM;  Surgeon: Jules Husbands, MD;  Location: [facility] ORS;  Service: General;;   KNEE ARTHROSCOPY Left    LAPAROSCOPY  02/28/2016   Procedure: Diagnotic laparoscopy with omental biopsy;  Surgeon: Jules Husbands, MD;  Location: [facility] ORS;  Service: General;;   TONSILLECTOMY     FAMILY HISTORY Family History  Problem Relation Age of Onset   Cancer Father    Heart attack Father    Cancer Brother    Diabetes Brother    Diabetes Son    Breast cancer Neg Hx    SOCIAL HISTORY Social History   Tobacco Use   Smoking status: Never   Smokeless tobacco: Never  Vaping Use   Vaping Use: Never used  Substance Use Topics   Alcohol use: No   Drug use: No       OPHTHALMIC EXAM:  Base Eye Exam     Visual Acuity (Snellen - Linear)       Right Left    Dist cc 20/60 20/60 +1   Dist ph cc 20/50 20/50    Correction: Glasses         Tonometry (Tonopen, 1:09 PM)       Right Left   Pressure 13 12         Pupils       Dark Light Shape React APD   Right 4 3 Round Brisk None   Left 4 3 Round Brisk None         Visual Fields       Left Right    Full Full         Extraocular Movement       Right Left    Full, Ortho Full, Ortho  Neuro/Psych     Oriented x3: Yes   Mood/Affect: Normal         Dilation     Both eyes: 1.0% Mydriacyl, 2.5% Phenylephrine @ 1:06 PM           Slit Lamp and Fundus Exam     Slit Lamp Exam       Right Left   Lids/Lashes Dermatochalasis - upper lid, Meibomian gland dysfunction - improved Dermatochalasis - upper lid, upper lid swelling   Conjunctiva/Sclera Mild inferior conjunctivochalasis White and quiet   Cornea early band K nasal and temporal extending centrally, 2+ PEE centrally, arcus, well healed temporal cataract wounds, tear film debris early band K nasal and temporal extending centrally, 2+PEE, arcus, well healed temporal cataract wounds   Anterior Chamber deep and clear deep and clear   Iris Round and dilated Round and dilated   Lens PC IOL in good position, 1+ Posterior capsular opacification PC IOL in good position, 1+ Posterior capsular opacification   Anterior Vitreous Vitreous syneresis, Posterior vitreous detachment Vitreous syneresis, Posterior vitreous detachment         Fundus Exam       Right Left   Disc mild pallor, Sharp rim, +Peripapillary atrophy Pink and Sharp, temporal Peripapillary atrophy   C/D Ratio 0.4 0.5   Macula Flat, Blunted foveal reflex, RPE mottling, clumping and atrophy centrally, No heme or edema, mild vitelliform like lesion Flat, Blunted foveal reflex, RPE mottling, clumping and atrophy greatest centrally, No heme or edema   Vessels attenuated, Tortuous attenuated, Tortuous   Periphery Attached, reticular degeneration, No heme  Attached, reticular degeneration, No heme           Refraction     Wearing Rx       Sphere Cylinder Axis Add   Right -1.00 +1.25 168 +3.00   Left -1.00 +1.00 180 +3.00           IMAGING AND PROCEDURES  Imaging and Procedures for 07/14/2022  OCT, Retina - OU - Both Eyes       Right Eye Quality was good. Central Foveal Thickness: 241. Progression has been stable. Findings include normal foveal contour, no IRF, no SRF, retinal drusen , subretinal hyper-reflective material, outer retinal atrophy (persistent focal central SRHM -- vitelliform like lesion; no central SRF, patchy ORA centrally).   Left Eye Quality was good. Central Foveal Thickness: 240. Progression has been stable. Findings include normal foveal contour, no IRF, no SRF, subretinal hyper-reflective material, outer retinal atrophy (focal central SRHM; no SRF, patchy ORA centrally).   Notes *Images captured and stored on drive  Diagnosis / Impression:  NFP, no IRF/SRF OU OD: persistent focal central SRHM -- vitelliform-like lesion; patchy ORA OS: focal central SRHM; patchy ORA  Clinical management:  See below  Abbreviations: NFP - Normal foveal profile. CME - cystoid macular edema. PED - pigment epithelial detachment. IRF - intraretinal fluid. SRF - subretinal fluid. EZ - ellipsoid zone. ERM - epiretinal membrane. ORA - outer retinal atrophy. ORT - outer retinal tubulation. SRHM - subretinal hyper-reflective material. IRHM - intraretinal hyper-reflective material            ASSESSMENT/PLAN:    ICD-10-CM   1. Intermediate stage nonexudative age-related macular degeneration of both eyes  H35.3132 OCT, Retina - OU - Both Eyes    2. Diabetes mellitus type 2 without retinopathy ([place])  E11.9     3. Essential hypertension  I10     4. Hypertensive retinopathy of both eyes  H35.033     5. Pseudophakia of both eyes  Z96.1     6. Band keratopathy of both eyes  H18.423     7. Combined forms of age-related  cataract of both eyes  H25.813      1. Age related macular degeneration, non-exudative, both eyes  - The incidence, anatomy, and pathology of dry AMD, risk of progression, and the AREDS and AREDS 2 studies including smoking risks discussed with patient.  - BCVA 20/50 OU  - OCT shows OD: persistent focal central SRHM; OS: Persistent focal central SRHM -- improved   - Continue [place] Grid monitoring  - f/u 6-9 months- DFE, OCT  2. Diabetes mellitus, type 2 without retinopathy - The incidence, risk factors for progression, natural history and treatment options for diabetic retinopathy  were discussed with patient.   - The need for close monitoring of blood glucose, blood pressure, and serum lipids, avoiding cigarette or any type of tobacco, and the need for long term follow up was also discussed with patient. - f/u in 1 year, sooner prn  3,4. Hypertensive retinopathy OU - discussed importance of tight BP control - monitor  5. Pseudophakia OU  - s/p CE/IOL w/Dr. Lucita Ferrara [phone]  - IOL in good position, doing well - monitor  6. Band keratopathy OU  - mild increase in central dryness OU  - cont ATs and lubricating gel  - currently on Xdemvy per Dr. Lucita Ferrara  Ophthalmic Meds Ordered this visit:  No orders of the defined types were placed in this encounter.    Return for f/u 6-9 months, non exu ARMD OU, DFE, OCT.  There are no Patient Instructions on file for this visit.  This document serves as a record of services personally performed by Gardiner Sleeper, MD, PhD. It was created on their behalf by Orvan Falconer, an ophthalmic technician. The creation of this record is the provider's dictation and/or activities during the visit.    Electronically signed by: Orvan Falconer, OA, 07/14/22  7:59 PM  This document serves as a record of services personally performed by Gardiner Sleeper, MD, PhD. It was created on their behalf by San Jetty. Owens Shark, OA an ophthalmic technician. The  creation of this record is the provider's dictation and/or activities during the visit.    Electronically signed by: San Jetty. Owens Shark, [place] 02.19.2024 7:59 PM  Gardiner Sleeper, M.D., Ph.D. Diseases & Surgery of the Retina and Vitreous [facility]  I have reviewed the above documentation for accuracy and completeness, and I agree with the above. Gardiner Sleeper, M.D., Ph.D. 07/14/22 8:00 PM   Abbreviations: M myopia (nearsighted); A astigmatism; H hyperopia (farsighted); P presbyopia; Mrx spectacle prescription;  CTL contact lenses; OD right eye; OS left eye; OU both eyes  XT exotropia; ET esotropia; PEK punctate epithelial keratitis; PEE punctate epithelial erosions; DES dry eye syndrome; MGD meibomian gland dysfunction; ATs artificial tears; PFAT's preservative free artificial tears; [place] nuclear sclerotic cataract; PSC posterior subcapsular cataract; ERM epi-retinal membrane; PVD posterior vitreous detachment; RD retinal detachment; DM diabetes mellitus; DR diabetic retinopathy; NPDR non-proliferative diabetic retinopathy; PDR proliferative diabetic retinopathy; CSME clinically significant macular edema; DME diabetic macular edema; dbh dot blot hemorrhages; CWS cotton wool spot; POAG primary open angle glaucoma; C/D cup-to-disc ratio; HVF humphrey visual field; GVF goldmann visual field; OCT optical coherence tomography; IOP intraocular pressure; BRVO Branch retinal vein occlusion; CRVO central retinal vein occlusion; CRAO central retinal artery occlusion; BRAO branch retinal artery  occlusion; RT retinal tear; SB scleral buckle; PPV pars plana vitrectomy; VH Vitreous hemorrhage; PRP panretinal laser photocoagulation; IVK intravitreal kenalog; VMT vitreomacular traction; MH Macular hole;  NVD neovascularization of the disc; NVE neovascularization elsewhere; AREDS age related eye disease study; ARMD age related macular degeneration; POAG primary open angle glaucoma; EBMD  epithelial/anterior basement membrane dystrophy; ACIOL anterior chamber intraocular lens; IOL intraocular lens; PCIOL posterior chamber intraocular lens; Phaco/IOL phacoemulsification with intraocular lens placement; [place] photorefractive keratectomy; LASIK laser assisted in situ keratomileusis; HTN hypertension; DM diabetes mellitus; COPD chronic obstructive pulmonary disease

## 2022-07-09 ENCOUNTER — Other Ambulatory Visit: Payer: Self-pay | Admitting: Obstetrics and Gynecology

## 2022-07-09 DIAGNOSIS — Z1231 Encounter for screening mammogram for malignant neoplasm of breast: Secondary | ICD-10-CM | POA: Diagnosis not present

## 2022-07-09 DIAGNOSIS — N644 Mastodynia: Secondary | ICD-10-CM | POA: Diagnosis not present

## 2022-07-14 ENCOUNTER — Encounter (INDEPENDENT_AMBULATORY_CARE_PROVIDER_SITE_OTHER): Payer: Self-pay | Admitting: Ophthalmology

## 2022-07-14 ENCOUNTER — Ambulatory Visit (INDEPENDENT_AMBULATORY_CARE_PROVIDER_SITE_OTHER): Payer: Medicare Other | Admitting: Ophthalmology

## 2022-07-14 DIAGNOSIS — H353132 Nonexudative age-related macular degeneration, bilateral, intermediate dry stage: Secondary | ICD-10-CM

## 2022-07-14 DIAGNOSIS — E119 Type 2 diabetes mellitus without complications: Secondary | ICD-10-CM

## 2022-07-14 DIAGNOSIS — H35033 Hypertensive retinopathy, bilateral: Secondary | ICD-10-CM | POA: Diagnosis not present

## 2022-07-14 DIAGNOSIS — H18423 Band keratopathy, bilateral: Secondary | ICD-10-CM

## 2022-07-14 DIAGNOSIS — I10 Essential (primary) hypertension: Secondary | ICD-10-CM

## 2022-07-14 DIAGNOSIS — Z961 Presence of intraocular lens: Secondary | ICD-10-CM | POA: Diagnosis not present

## 2022-07-14 DIAGNOSIS — H25813 Combined forms of age-related cataract, bilateral: Secondary | ICD-10-CM

## 2022-07-25 ENCOUNTER — Ambulatory Visit
Admission: RE | Admit: 2022-07-25 | Discharge: 2022-07-25 | Disposition: A | Discharge status: Home / Self Care | Payer: Medicare Other | Source: Ambulatory Visit | Attending: Obstetrics and Gynecology | Admitting: Obstetrics and Gynecology

## 2022-07-25 DIAGNOSIS — Z1231 Encounter for screening mammogram for malignant neoplasm of breast: Secondary | ICD-10-CM

## 2022-08-11 NOTE — Progress Notes (Unsigned)
Patient ID: Victoria Dalton, female    DOB: [birth year], 89 y.o.   MRN: [id number]  HPI  Ms Halfmann is a 89 y/o female with a history of DM, hyperlipidemia, HTN, CKD, GERD, kidney stone, UTI and chronic heart failure.   Echo report from 03/09/22 reviewed and showed an EF of 50-55% along with moderate LVH and moderate MR.   Admitted 03/08/22 due to SOB on exertion. Found to have new onset AF along with pulmonary edema on CXR. Cardiology consult obtained. Discharged after 4 days.   She presents today for a HF follow-up visit with a chief complaint of minimal SOB with moderate exertion. Chronic in nature. Has associated fatigue along with this. Denies difficulty sleeping, dizziness, abdominal distention, palpitations, pedal edema, chest pain, wheezing, cough or weight gain.   Past Medical History:  Diagnosis Date   CHF (congestive heart failure) ([facility])    Diabetes mellitus without complication ([facility])    Dyspnea    Gallbladder calculus    GERD (gastroesophageal reflux disease)    Hyperlipidemia    Hypertension    Hypertensive retinopathy    OU   Kidney stone    Macular degeneration    Dry OU   Mass    Parotid swelling    UTI (lower urinary tract infection)    Past Surgical History:  Procedure Laterality Date   CATARACT EXTRACTION Bilateral 02/2020   Dr. Vevelyn Royals   CHOLECYSTECTOMY N/A 04/30/2016   Procedure: CHOLECYSTECTOMY;  Surgeon: Jules Husbands, MD;  Location: [facility] ORS;  Service: General;  Laterality: N/A;   DIAGNOSTIC LAPAROSCOPIC LIVER BIOPSY  02/28/2016   Procedure: DIAGNOSTIC LAPAROSCOPIC LIVER BIOPSY;  Surgeon: Jules Husbands, MD;  Location: [facility] ORS;  Service: General;;   ERCP N/A 01/13/2016   Procedure: ENDOSCOPIC RETROGRADE CHOLANGIOPANCREATOGRAPHY (ERCP);  Surgeon: Lucilla Lame, MD;  Location: [facility] ENDOSCOPY;  Service: Endoscopy;  Laterality: N/A;   ERCP N/A 04/08/2016   Procedure: ENDOSCOPIC RETROGRADE CHOLANGIOPANCREATOGRAPHY (ERCP) Stent removal;  Surgeon:  Lucilla Lame, MD;  Location: [facility] ENDOSCOPY;  Service: Endoscopy;  Laterality: N/A;   EYE SURGERY Bilateral 02/2020   Cat Sx - Dr. Vevelyn Royals   INTRAOPERATIVE CHOLANGIOGRAM  04/30/2016   Procedure: INTRAOPERATIVE CHOLANGIOGRAM;  Surgeon: Jules Husbands, MD;  Location: [facility] ORS;  Service: General;;   KNEE ARTHROSCOPY Left    LAPAROSCOPY  02/28/2016   Procedure: Diagnotic laparoscopy with omental biopsy;  Surgeon: Jules Husbands, MD;  Location: [facility] ORS;  Service: General;;   TONSILLECTOMY     Family History  Problem Relation Age of Onset   Cancer Father    Heart attack Father    Cancer Brother    Diabetes Brother    Diabetes Son    Breast cancer Neg Hx    Social History   Tobacco Use   Smoking status: Never   Smokeless tobacco: Never  Substance Use Topics   Alcohol use: No   Allergies  Allergen Reactions   Chlorhexidine Itching   Ramipril Other (See Comments)    IRREGULAR HEART BEAT   Penicillins Rash    Has patient had a PCN reaction causing immediate rash, facial/tongue/throat swelling, SOB or lightheadedness with hypotension: Yes Has patient had a PCN reaction causing severe rash involving mucus membranes or skin necrosis: Yes Has patient had a PCN reaction that required hospitalization No Has patient had a PCN reaction occurring within the last 10 years: No If all of the above answers are "NO", then may proceed with Cephalosporin use.  Poison Oak Extract Rash   Sulfa Antibiotics Rash   Prior to Admission medications   Medication Sig Start Date End Date Taking? Authorizing Provider  apixaban (ELIQUIS) 5 MG TABS tablet Take 1 tablet (5 mg total) by mouth 2 (two) times daily. 03/26/22  Yes Masoud, Viann Shove, MD  carboxymethylcellulose (REFRESH PLUS) 0.5 % SOLN 1 drop 3 (three) times daily as needed.   Yes [provider]  conjugated estrogens (PREMARIN) vaginal cream Place 1 Applicatorful vaginally 2 (two) times a week. 1 gram vaginally at bedtime twice weekly  03/27/22  Yes Masoud, Viann Shove, MD  diltiazem (CARDIZEM CD) 240 MG 24 hr capsule Take 1 capsule (240 mg total) by mouth daily. 03/26/22  Yes Masoud, Viann Shove, MD  furosemide (LASIX) 40 MG tablet Take 1 tablet (40 mg total) by mouth daily. 04/15/22  Yes Aireal Slater A, FNP  glimepiride (AMARYL) 4 MG tablet TAKE 1 TABLET(4 MG) BY MOUTH DAILY BEFORE BREAKFAST 05/27/22  Yes Masoud, Viann Shove, MD  glucose blood (ONETOUCH ULTRA) test strip USE AS DIRECTED 04/21/22  Yes Masoud, Viann Shove, MD  losartan (COZAAR) 25 MG tablet Take 1 tablet (25 mg total) by mouth daily. 04/15/22  Yes Darylene Price A, FNP  metoprolol succinate (TOPROL-XL) 100 MG 24 hr tablet Take 100 mg by mouth daily. Take with or immediately following a meal.   Yes [provider]  Multiple Vitamins-Minerals (PRESERVISION AREDS 2 PO) Take by mouth.   Yes [provider]  pantoprazole (PROTONIX) 40 MG tablet TAKE 1 TABLET(40 MG) BY MOUTH DAILY 03/26/22  Yes Masoud, Viann Shove, MD  simvastatin (ZOCOR) 40 MG tablet Take 1 tablet (40 mg total) by mouth daily. 03/26/22  Yes Masoud, Viann Shove, MD  acetaminophen (TYLENOL) 500 MG tablet Take 500 mg by mouth every 6 (six) hours as needed for mild pain. Patient not taking: Reported on 08/12/2022    [provider]   Review of Systems  Constitutional:  Positive for fatigue. Negative for appetite change.  HENT:  Negative for congestion, postnasal drip and sore throat.   Eyes: Negative.   Respiratory:  Positive for shortness of breath (minimal). Negative for cough, chest tightness and wheezing.   Cardiovascular:  Negative for chest pain, palpitations and leg swelling.  Gastrointestinal:  Negative for abdominal distention and abdominal pain.  Endocrine: Negative.   Genitourinary: Negative.   Musculoskeletal:  Negative for back pain and neck pain.  Skin: Negative.   Allergic/Immunologic: Negative.   Neurological:  Negative for dizziness and light-headedness.  Hematological:  Negative for adenopathy.  Does not bruise/bleed easily.  Psychiatric/Behavioral:  Negative for dysphoric mood and sleep disturbance (sleeping on 1 pillow). The patient is not nervous/anxious.    Vitals:   08/12/22 0934  BP: (!) 119/91  Pulse: 90  SpO2: 97%  Weight: 241 lb 6.4 oz (109.5 kg)   Wt Readings from Last 3 Encounters:  08/12/22 241 lb 6.4 oz (109.5 kg)  05/13/22 249 lb 2 oz (113 kg)  04/15/22 256 lb (116.1 kg)   Lab Results  Component Value Date   CREATININE 1.06 (H) 08/12/2022   CREATININE 1.20 (H) 05/13/2022   CREATININE 1.07 (H) 03/12/2022   Physical Exam Vitals and nursing note reviewed. Exam conducted with a chaperone present (daughter).  Constitutional:      Appearance: Normal appearance.  HENT:     Head: Normocephalic and atraumatic.  Cardiovascular:     Rate and Rhythm: Normal rate and regular rhythm.  Pulmonary:     Effort: Pulmonary effort is normal. No respiratory  distress.     Breath sounds: No wheezing or rales.  Abdominal:     General: There is no distension.     Palpations: Abdomen is soft.     Tenderness: There is no abdominal tenderness.  Musculoskeletal:        General: No tenderness.     Cervical back: Normal range of motion and neck supple.     Right lower leg: No tenderness. No edema.     Left lower leg: No tenderness. No edema.  Skin:    General: Skin is warm and dry.  Neurological:     General: No focal deficit present.     Mental Status: She is alert and oriented to person, place, and time.  Psychiatric:        Mood and Affect: Mood normal.        Behavior: Behavior normal.        Thought Content: Thought content normal.   Assessment & Plan:  1: Chronic heart failure with preserved ejection fraction with LVH- - NYHA class II - euvolemic today - weighing daily and reports a continued gradual weight loss; reminded to call for an overnight weight gain of > 2 pounds or a weekly weight gain of > 5 pounds - weight down 8 pounds from last visit here 3 months  ago - echo 03/09/22: EF of 50-55% along with moderate LVH and moderate MR.  - not adding salt to her food and has been trying to follow a low sodium diet - furosemide 40mg  daily - losartan 25mg  daily - metoprolol succinate 100mg  daily - has a history of UTI's so not a good candidate for SGLT2 - BNP 03/08/22 was 495.3  2: HTN- - BP 119/91 - saw PCP (Masoud) 03/26/22 - BMP 05/13/22 reviewed and showed sodium 137, potassium 3.5, creatinine 1.2 & GFR 43 - BMP today  3: DM- - A1c 03/03/22 was 8.1% - home glucose today was 119  4: Atrial fibrillation- - currently rate controlled - diltiazem 240mg  daily - apixaban 5mg  BID - saw cardiology [facility]) 04/15/22   Return in 6 months, sooner if needed.

## 2022-08-12 ENCOUNTER — Encounter: Payer: Self-pay | Admitting: Family

## 2022-08-12 ENCOUNTER — Telehealth: Payer: Self-pay

## 2022-08-12 ENCOUNTER — Ambulatory Visit (HOSPITAL_BASED_OUTPATIENT_CLINIC_OR_DEPARTMENT_OTHER): Payer: Medicare Other | Admitting: Family

## 2022-08-12 ENCOUNTER — Telehealth: Payer: Self-pay | Admitting: *Deleted

## 2022-08-12 ENCOUNTER — Other Ambulatory Visit
Admission: RE | Admit: 2022-08-12 | Discharge: 2022-08-12 | Disposition: A | Discharge status: Home / Self Care | Payer: Medicare Other | Source: Ambulatory Visit | Attending: Family | Admitting: Family

## 2022-08-12 VITALS — BP 119/91 | HR 90 | Wt 241.4 lb

## 2022-08-12 DIAGNOSIS — Z7901 Long term (current) use of anticoagulants: Secondary | ICD-10-CM | POA: Insufficient documentation

## 2022-08-12 DIAGNOSIS — N182 Chronic kidney disease, stage 2 (mild): Secondary | ICD-10-CM

## 2022-08-12 DIAGNOSIS — R5383 Other fatigue: Secondary | ICD-10-CM | POA: Insufficient documentation

## 2022-08-12 DIAGNOSIS — I4891 Unspecified atrial fibrillation: Secondary | ICD-10-CM | POA: Insufficient documentation

## 2022-08-12 DIAGNOSIS — Z87442 Personal history of urinary calculi: Secondary | ICD-10-CM | POA: Insufficient documentation

## 2022-08-12 DIAGNOSIS — E785 Hyperlipidemia, unspecified: Secondary | ICD-10-CM | POA: Insufficient documentation

## 2022-08-12 DIAGNOSIS — Z7984 Long term (current) use of oral hypoglycemic drugs: Secondary | ICD-10-CM | POA: Insufficient documentation

## 2022-08-12 DIAGNOSIS — N189 Chronic kidney disease, unspecified: Secondary | ICD-10-CM | POA: Insufficient documentation

## 2022-08-12 DIAGNOSIS — Z79899 Other long term (current) drug therapy: Secondary | ICD-10-CM | POA: Insufficient documentation

## 2022-08-12 DIAGNOSIS — I5032 Chronic diastolic (congestive) heart failure: Secondary | ICD-10-CM

## 2022-08-12 DIAGNOSIS — I10 Essential (primary) hypertension: Secondary | ICD-10-CM

## 2022-08-12 DIAGNOSIS — I480 Paroxysmal atrial fibrillation: Secondary | ICD-10-CM

## 2022-08-12 DIAGNOSIS — E1122 Type 2 diabetes mellitus with diabetic chronic kidney disease: Secondary | ICD-10-CM | POA: Insufficient documentation

## 2022-08-12 DIAGNOSIS — K219 Gastro-esophageal reflux disease without esophagitis: Secondary | ICD-10-CM | POA: Insufficient documentation

## 2022-08-12 DIAGNOSIS — I130 Hypertensive heart and chronic kidney disease with heart failure and stage 1 through stage 4 chronic kidney disease, or unspecified chronic kidney disease: Secondary | ICD-10-CM | POA: Insufficient documentation

## 2022-08-12 DIAGNOSIS — Z87440 Personal history of urinary (tract) infections: Secondary | ICD-10-CM | POA: Insufficient documentation

## 2022-08-12 LAB — BASIC METABOLIC PANEL
Anion gap: 11 (ref 5–15)
BUN: 26 mg/dL — ABNORMAL HIGH (ref 8–23)
CO2: 26 mmol/L (ref 22–32)
Calcium: 8.7 mg/dL — ABNORMAL LOW (ref 8.9–10.3)
Chloride: 102 mmol/L (ref 98–111)
Creatinine, Ser: 1.06 mg/dL — ABNORMAL HIGH (ref 0.44–1.00)
GFR, Estimated: 50 mL/min — ABNORMAL LOW (ref >60)
Glucose, Bld: 155 mg/dL — ABNORMAL HIGH (ref 70–99)
Potassium: 3.3 mmol/L — ABNORMAL LOW (ref 3.5–5.1)
Sodium: 139 mmol/L (ref 135–145)

## 2022-08-12 MED ORDER — POTASSIUM CHLORIDE CRYS ER 20 MEQ PO TBCR: 20.0000 meq | EXTENDED_RELEASE_TABLET | Freq: Every day | ORAL | 3 refills | Status: DC

## 2022-08-12 NOTE — Telephone Encounter (Signed)
Victoria Dalton, [place] 08/12/2022 12:03 PM EDT Back to Top    Please call daughter [name] w/ instructions.   Potassium is a little low so begin potassium 37meq daily. Kidney function improving. Recheck BMP in 2 weeks.      Spoke with pts daughter she is aware of results. Kdur sent to pharmacy and lab order placed.

## 2022-08-12 NOTE — Telephone Encounter (Signed)
-----   Message from Alisa Graff, [place] sent at 08/12/2022 12:03 PM EDT ----- Please call daughter [name] w/ instructions.   Potassium is a little low so begin potassium 35meq daily. Kidney function improving. Recheck BMP in 2 weeks.

## 2022-08-12 NOTE — Progress Notes (Signed)
Medication Samples have been provided to the patient.  Drug name: Eliquis       Strength: 5 mg        Qty: 28 tablets  LOT: [id number]  Exp.Date: June 2025  Dosing instructions: Take one tablet by mouth two times daily   The patient has been instructed regarding the correct time, dose, and frequency of taking this medication, including desired effects and most common side effects.   Barbette Or Patrick Salemi 1:50 PM 08/12/2022

## 2022-08-14 NOTE — Telephone Encounter (Signed)
Pt's daughter confirmed that she spoke with someone else in the office after this nurse left message asking her to call back on 3/19 for provider results and potassium level. Pt instructed to come for labs to be rechecked in two weeks. Pt's daughter aware and agreeable.  Lab orders already placed.

## 2022-08-25 ENCOUNTER — Other Ambulatory Visit: Payer: Self-pay | Admitting: *Deleted

## 2022-08-25 MED ORDER — METOPROLOL SUCCINATE ER 100 MG PO TB24: 100.0000 mg | ORAL_TABLET | Freq: Every day | ORAL | 0 refills | Status: DC

## 2022-08-28 ENCOUNTER — Encounter: Payer: Self-pay | Admitting: Family

## 2022-08-28 ENCOUNTER — Other Ambulatory Visit
Admission: RE | Admit: 2022-08-28 | Discharge: 2022-08-28 | Disposition: A | Discharge status: Home / Self Care | Payer: Medicare Other | Source: Ambulatory Visit | Attending: Family | Admitting: Family

## 2022-08-28 DIAGNOSIS — I5032 Chronic diastolic (congestive) heart failure: Secondary | ICD-10-CM | POA: Insufficient documentation

## 2022-08-28 LAB — BASIC METABOLIC PANEL
Anion gap: 11 (ref 5–15)
BUN: 32 mg/dL — ABNORMAL HIGH (ref 8–23)
CO2: 24 mmol/L (ref 22–32)
Calcium: 8.7 mg/dL — ABNORMAL LOW (ref 8.9–10.3)
Chloride: 102 mmol/L (ref 98–111)
Creatinine, Ser: 1.19 mg/dL — ABNORMAL HIGH (ref 0.44–1.00)
GFR, Estimated: 44 mL/min — ABNORMAL LOW (ref >60)
Glucose, Bld: 210 mg/dL — ABNORMAL HIGH (ref 70–99)
Potassium: 4.1 mmol/L (ref 3.5–5.1)
Sodium: 137 mmol/L (ref 135–145)

## 2022-09-12 NOTE — Telephone Encounter (Signed)
Spoke with pt's daughter Cordelia Pen) over the phone to make aware of Clarisa Kindred ,FNP lab result msg. Pt daughter aware, agreeable, and verbalized understanding.    Delma Freeze, FNP     Good afternoon Ms Lokelani, Lutes potassium level looks better and your kidney function is within your range. Please continue all your medications.    Clarisa Kindred, FNP

## 2022-09-19 ENCOUNTER — Other Ambulatory Visit: Payer: Self-pay | Admitting: Family

## 2022-10-15 DIAGNOSIS — I10 Essential (primary) hypertension: Secondary | ICD-10-CM | POA: Diagnosis not present

## 2022-10-15 DIAGNOSIS — I4891 Unspecified atrial fibrillation: Secondary | ICD-10-CM | POA: Diagnosis not present

## 2022-10-15 DIAGNOSIS — I5032 Chronic diastolic (congestive) heart failure: Secondary | ICD-10-CM | POA: Diagnosis not present

## 2022-10-15 DIAGNOSIS — E119 Type 2 diabetes mellitus without complications: Secondary | ICD-10-CM | POA: Diagnosis not present

## 2022-10-15 DIAGNOSIS — K219 Gastro-esophageal reflux disease without esophagitis: Secondary | ICD-10-CM | POA: Diagnosis not present

## 2022-10-15 DIAGNOSIS — R0602 Shortness of breath: Secondary | ICD-10-CM | POA: Diagnosis not present

## 2022-10-15 DIAGNOSIS — G4733 Obstructive sleep apnea (adult) (pediatric): Secondary | ICD-10-CM | POA: Diagnosis not present

## 2022-10-15 DIAGNOSIS — E785 Hyperlipidemia, unspecified: Secondary | ICD-10-CM | POA: Diagnosis not present

## 2022-10-24 DIAGNOSIS — I509 Heart failure, unspecified: Secondary | ICD-10-CM | POA: Diagnosis not present

## 2022-10-24 DIAGNOSIS — E785 Hyperlipidemia, unspecified: Secondary | ICD-10-CM | POA: Diagnosis not present

## 2022-10-24 DIAGNOSIS — I10 Essential (primary) hypertension: Secondary | ICD-10-CM | POA: Diagnosis not present

## 2022-10-24 DIAGNOSIS — I4891 Unspecified atrial fibrillation: Secondary | ICD-10-CM | POA: Diagnosis not present

## 2022-10-24 DIAGNOSIS — E119 Type 2 diabetes mellitus without complications: Secondary | ICD-10-CM | POA: Diagnosis not present

## 2022-10-24 DIAGNOSIS — I110 Hypertensive heart disease with heart failure: Secondary | ICD-10-CM | POA: Diagnosis not present

## 2022-11-05 ENCOUNTER — Other Ambulatory Visit: Payer: Self-pay | Admitting: Family

## 2022-11-25 ENCOUNTER — Other Ambulatory Visit: Payer: Self-pay | Admitting: Family

## 2022-11-26 DIAGNOSIS — S80812A Abrasion, left lower leg, initial encounter: Secondary | ICD-10-CM | POA: Diagnosis not present

## 2022-12-04 DIAGNOSIS — R0602 Shortness of breath: Secondary | ICD-10-CM | POA: Diagnosis not present

## 2022-12-31 DIAGNOSIS — R609 Edema, unspecified: Secondary | ICD-10-CM | POA: Diagnosis not present

## 2022-12-31 DIAGNOSIS — R0602 Shortness of breath: Secondary | ICD-10-CM | POA: Diagnosis not present

## 2022-12-31 DIAGNOSIS — R600 Localized edema: Secondary | ICD-10-CM | POA: Diagnosis not present

## 2023-01-05 DIAGNOSIS — I700 Atherosclerosis of aorta: Secondary | ICD-10-CM | POA: Diagnosis not present

## 2023-01-05 DIAGNOSIS — R0602 Shortness of breath: Secondary | ICD-10-CM | POA: Diagnosis not present

## 2023-02-05 ENCOUNTER — Other Ambulatory Visit: Payer: Self-pay

## 2023-02-05 ENCOUNTER — Emergency Department: Payer: Medicare Other

## 2023-02-05 ENCOUNTER — Emergency Department
Admission: EM | Admit: 2023-02-05 | Discharge: 2023-02-05 | Disposition: A | Discharge status: Home / Self Care | Payer: Medicare Other | Attending: Emergency Medicine | Admitting: Emergency Medicine

## 2023-02-05 ENCOUNTER — Encounter: Payer: Self-pay | Admitting: Emergency Medicine

## 2023-02-05 DIAGNOSIS — I10 Essential (primary) hypertension: Secondary | ICD-10-CM | POA: Diagnosis not present

## 2023-02-05 DIAGNOSIS — Z7901 Long term (current) use of anticoagulants: Secondary | ICD-10-CM | POA: Insufficient documentation

## 2023-02-05 DIAGNOSIS — E119 Type 2 diabetes mellitus without complications: Secondary | ICD-10-CM | POA: Insufficient documentation

## 2023-02-05 DIAGNOSIS — N950 Postmenopausal bleeding: Secondary | ICD-10-CM | POA: Insufficient documentation

## 2023-02-05 LAB — SAMPLE TO BLOOD BANK

## 2023-02-05 LAB — URINALYSIS, ROUTINE W REFLEX MICROSCOPIC
Bacteria, UA: NONE SEEN
RBC / HPF: >50 RBC/hpf (ref 0–5)
Squamous Epithelial / HPF: 0 /HPF (ref 0–5)
WBC, UA: >50 WBC/hpf (ref 0–5)

## 2023-02-05 LAB — BASIC METABOLIC PANEL
Anion gap: 14 (ref 5–15)
BUN: 18 mg/dL (ref 8–23)
CO2: 25 mmol/L (ref 22–32)
Calcium: 9.2 mg/dL (ref 8.9–10.3)
Chloride: 100 mmol/L (ref 98–111)
Creatinine, Ser: 0.82 mg/dL (ref 0.44–1.00)
GFR, Estimated: >60 mL/min (ref >60)
Glucose, Bld: 184 mg/dL — ABNORMAL HIGH (ref 70–99)
Potassium: 3.5 mmol/L (ref 3.5–5.1)
Sodium: 139 mmol/L (ref 135–145)

## 2023-02-05 LAB — CBC WITH DIFFERENTIAL/PLATELET
Abs Immature Granulocytes: 0.18 10*3/uL — ABNORMAL HIGH (ref 0.00–0.07)
Basophils Absolute: 0.1 10*3/uL (ref 0.0–0.1)
Basophils Relative: 1 %
Eosinophils Absolute: 0 10*3/uL (ref 0.0–0.5)
Eosinophils Relative: 0 %
HCT: 42.4 % (ref 36.0–46.0)
Hemoglobin: 14.1 g/dL (ref 12.0–15.0)
Immature Granulocytes: 2 %
Lymphocytes Relative: 15 %
Lymphs Abs: 1.6 10*3/uL (ref 0.7–4.0)
MCH: 30.3 pg (ref 26.0–34.0)
MCHC: 33.3 g/dL (ref 30.0–36.0)
MCV: 91 fL (ref 80.0–100.0)
Monocytes Absolute: 0.6 10*3/uL (ref 0.1–1.0)
Monocytes Relative: 6 %
Neutro Abs: 8.2 10*3/uL — ABNORMAL HIGH (ref 1.7–7.7)
Neutrophils Relative %: 76 %
Platelets: 188 10*3/uL (ref 150–400)
RBC: 4.66 MIL/uL (ref 3.87–5.11)
RDW: 12.9 % (ref 11.5–15.5)
WBC: 10.6 10*3/uL — ABNORMAL HIGH (ref 4.0–10.5)
nRBC: 0 % (ref 0.0–0.2)

## 2023-02-05 NOTE — ED Notes (Addendum)
Pt states that they noticed bleeding this morning around 0445 and has c/o pain that is like an "urgency to pee." Pt states that it is not a pelvic pain nor cramping. Pt states that this has happened before and pt has a hx of seeing blood when urinating. Pt states that she wears diapers daily and noticed blood on the diaper throughout this morning and when she uses the bathroom. Pt states that there were clots in the toilet and that it is bright red blood. Pt family states that they urine they saw earlier this morning was dark red blood "like the color of the cord on the EKG lead. Pain is 8/10.

## 2023-02-05 NOTE — ED Triage Notes (Signed)
Pt to ED via POV for vaginal bleeding. Pt states that the bleeding started this morning about 0445. Pt states that she has used 3 pads this morning. Pt states that she is passing some clots. Pt states that she is also having pain in her vaginal area. Pt reports red blood when wiping. Pt is in NAD. Pt states that she had a similar episode a while back. Pt states that she is on Eliquis.

## 2023-02-05 NOTE — ED Notes (Signed)
Pt to US.

## 2023-02-05 NOTE — ED Provider Notes (Signed)
[facility] - [facility] Provider Note    Event Date/Time   First MD Initiated Contact with Patient 02/05/23 0809     (approximate)   History   Vaginal Bleeding   HPI  Victoria Dalton is a 89 year old female with history of HTN, T2DM, frequent UTIs, A-fib on Eliquis presenting to the emergency department for evaluation of vaginal bleeding.  Patient reports that she has been having some ongoing pain at the tip of her urethra and more recently has had urinary urgency.  She reports she has been on Provera as a preventative medication for a while, continues to take this without recent change in dosing.  This morning, she had onset of bleeding, unsure if urinary or vaginal starting around 4:45 AM.  She did note some clots in the toilet bowl.  No abdominal pain.     Physical Exam   Triage Vital Signs: ED Triage Vitals [02/05/23 0731]  Encounter Vitals Group     BP (!) 151/92     Systolic BP Percentile      Diastolic BP Percentile      Pulse Rate 70     Resp 16     Temp 97.7 F (36.5 C)     Temp Source Oral     SpO2 97 %     Weight 240 lb (108.9 kg)     Height 5\' 9"  (1.753 m)     Head Circumference      Peak Flow      Pain Score 8     Pain Loc      Pain Education      Exclude from Growth Chart     Most recent vital signs: Vitals:   02/05/23 0900 02/05/23 0930  BP: 139/81 (!) 153/99  Pulse: 74 97  Resp: 16 18  Temp:    SpO2: 91% 91%     General: Awake, interactive  CV:  Regular rate, good peripheral perfusion.  Resp:  Lungs clear, unlabored respirations.  Abd:  Soft, nondistended, nontender to palpation GU:  Difficult to visualize external anatomy due to limited range of motion of patient, does have some blood visualized over the urethra and external vaginal area.  On speculum exam, there is a small amount of blood visualized within the vaginal vault without active bleeding or visualized protruding material from the cervix. Neuro:  Symmetric facial  movement, fluid speech   ED Results / Procedures / Treatments   Labs (all labs ordered are listed, but only abnormal results are displayed) Labs Reviewed  CBC WITH DIFFERENTIAL/PLATELET - Abnormal; Notable for the following components:      Result Value   WBC 10.6 (*)    Neutro Abs 8.2 (*)    Abs Immature Granulocytes 0.18 (*)    All other components within normal limits  BASIC METABOLIC PANEL - Abnormal; Notable for the following components:   Glucose, Bld 184 (*)    All other components within normal limits  URINALYSIS, ROUTINE W REFLEX MICROSCOPIC - Abnormal; Notable for the following components:   Color, Urine RED (*)    APPearance TURBID (*)    Glucose, UA   (*)    Value: TEST NOT REPORTED DUE TO COLOR INTERFERENCE OF URINE PIGMENT   Hgb urine dipstick   (*)    Value: TEST NOT REPORTED DUE TO COLOR INTERFERENCE OF URINE PIGMENT   Bilirubin Urine   (*)    Value: TEST NOT REPORTED DUE TO COLOR INTERFERENCE OF URINE PIGMENT  Ketones, ur   (*)    Value: TEST NOT REPORTED DUE TO COLOR INTERFERENCE OF URINE PIGMENT   Protein, ur   (*)    Value: TEST NOT REPORTED DUE TO COLOR INTERFERENCE OF URINE PIGMENT   Nitrite   (*)    Value: TEST NOT REPORTED DUE TO COLOR INTERFERENCE OF URINE PIGMENT   Leukocytes,Ua   (*)    Value: TEST NOT REPORTED DUE TO COLOR INTERFERENCE OF URINE PIGMENT   All other components within normal limits  SAMPLE TO BLOOD BANK     EKG EKG independently reviewed interpreted by myself (ER attending) demonstrates:    RADIOLOGY Imaging independently reviewed and interpreted by myself demonstrates:  Transvaginal ultrasound without evidence of mass or endometrial thickening, benign appearing ovarian cyst noted by radiology  PROCEDURES:  Critical Care performed: No  Procedures   MEDICATIONS ORDERED IN ED: Medications - No data to display   IMPRESSION / MDM / ASSESSMENT AND PLAN / ED COURSE  I reviewed the triage vital signs and the nursing  notes.  Differential diagnosis includes, but is not limited to, vaginal wall thinning exacerbated by anticoagulant use, polyps, malignancy, lower suspicion for hematuria based on exam  Patient's presentation is most consistent with acute presentation with potential threat to life or bodily function.  89 year old female presenting with vaginal bleeding.  Vital signs stable on presentation.  Labs with stable hemoglobin at 14.1.  Urinalysis difficult to interpret given gross blood though no bacteria seen, do think it is reasonable to hold off on empiric treatment for now.  Transvaginal ultrasound was overall reassuring.  Patient does follow with [facility] gynecology.  Case was discussed with Dr. Jean Rosenthal who said that if patient is otherwise stable he did feel that this could be managed as an outpatient.  Normal hemoglobin here at 14.1.  Stable vitals.  Discussed with patient and she is comfortable with plan for discharge with outpatient follow-up.  Her vaginal bleeding has nearly completely stopped.  With this, do not recommend any medication changes currently.  Strict return precautions provided.  Patient discharged stable condition.      FINAL CLINICAL IMPRESSION(S) / ED DIAGNOSES   Final diagnoses:  Dalton-menopause bleeding     Rx / DC Orders   ED Discharge Orders     None        Note:  This document was prepared using Dragon voice recognition software and may include unintentional dictation errors.   Victoria Post, MD 02/05/23 [phone]

## 2023-02-05 NOTE — ED Notes (Signed)
PT has hx/o of UTIs and states that she has the urge to urinate constantly, however, pt states that the blood is coming from her vagina and not her urine.

## 2023-02-05 NOTE — ED Notes (Signed)
Pelvic cart at bedside. 

## 2023-02-05 NOTE — ED Notes (Signed)
Pt back from US

## 2023-02-05 NOTE — ED Notes (Signed)
Type and screen and blue top sent to the lab with save labels.

## 2023-02-05 NOTE — Discharge Instructions (Signed)
You were seen in the emergency department today for evaluation of your vaginal bleeding.  Your ultrasound was reassuring.  Please follow-up with your gynecology office for further evaluation.  Return to the ER for new or worsening symptoms including heavy vaginal bleeding (changing a saturated pad more than once an hour), development of abdominal pain, or any other new or concerning symptoms.

## 2023-02-10 ENCOUNTER — Other Ambulatory Visit: Payer: Self-pay | Admitting: Family

## 2023-02-10 DIAGNOSIS — R3915 Urgency of urination: Secondary | ICD-10-CM | POA: Diagnosis not present

## 2023-02-10 DIAGNOSIS — R300 Dysuria: Secondary | ICD-10-CM | POA: Diagnosis not present

## 2023-02-11 NOTE — Progress Notes (Deleted)
PCP: Dorothey Baseman, MD (last seen 07/24) Primary Cardiologist: Dorothyann Peng, MD (last seen 05/24)  HPI:   Ms Victoria Dalton is a 89 y/o female with a history of DM, hyperlipidemia, HTN, CKD, GERD, atrial fibrillation, kidney stone, UTI and chronic heart failure.   Admitted 03/08/22 due to SOB on exertion. Found to have new onset AF along with pulmonary edema on CXR. Cardiology consult obtained. Discharged after 4 days.   Echo 03/09/22: EF of 50-55% along with moderate LVH and moderate MR.    She presents today for a HF follow-up visit with a chief complaint of    ROS: All systems negative except as listed in HPI, PMH and Problem List.  SH:  Social History   Socioeconomic History   Marital status: Widowed    Spouse name: Not on file   Number of children: Not on file   Years of education: Not on file   Highest education level: Not on file  Occupational History   Not on file  Tobacco Use   Smoking status: Never   Smokeless tobacco: Never  Vaping Use   Vaping status: Never Used  Substance and Sexual Activity   Alcohol use: No   Drug use: No   Sexual activity: Not Currently  Other Topics Concern   Not on file  Social History Narrative   Not on file   Social Determinants of Health   Financial Resource Strain: Low Risk  (02/21/2021)   Overall Financial Resource Strain (CARDIA)    Difficulty of Paying Living Expenses: Not very hard  Food Insecurity: No Food Insecurity (02/21/2021)   Hunger Vital Sign    Worried About Running Out of Food in the Last Year: Never true    Ran Out of Food in the Last Year: Never true  Transportation Needs: No Transportation Needs (02/21/2021)   PRAPARE - Administrator, Civil Service (Medical): No    Lack of Transportation (Non-Medical): No  Physical Activity: Inactive (02/21/2021)   Exercise Vital Sign    Days of Exercise per Week: 0 days    Minutes of Exercise per Session: 0 min  Stress: No Stress Concern Present (02/21/2021)    [employer] of Occupational Health - Occupational Stress Questionnaire    Feeling of Stress : Not at all  Social Connections: Socially Isolated (02/21/2021)   Social Connection and Isolation Panel [NHANES]    Frequency of Communication with Friends and Family: More than three times a week    Frequency of Social Gatherings with Friends and Family: More than three times a week    Attends Religious Services: Never    Database administrator or Organizations: No    Attends Banker Meetings: Never    Marital Status: Widowed  Intimate Partner Violence: Not At Risk (02/21/2021)   Humiliation, Afraid, Rape, and Kick questionnaire    Fear of Current or Ex-Partner: No    Emotionally Abused: No    Physically Abused: No    Sexually Abused: No    FH:  Family History  Problem Relation Age of Onset   Cancer Father    Heart attack Father    Cancer Brother    Diabetes Brother    Diabetes Son    Breast cancer Neg Hx     Past Medical History:  Diagnosis Date   CHF (congestive heart failure) (HCC)    Diabetes mellitus without complication (HCC)    Dyspnea    Gallbladder calculus    GERD (gastroesophageal reflux  disease)    Hyperlipidemia    Hypertension    Hypertensive retinopathy    OU   Kidney stone    Macular degeneration    Dry OU   Mass    Parotid swelling    UTI (lower urinary tract infection)     Current Outpatient Medications  Medication Sig Dispense Refill   acetaminophen (TYLENOL) 500 MG tablet Take 500 mg by mouth every 6 (six) hours as needed for mild pain. (Patient not taking: Reported on 08/12/2022)     apixaban (ELIQUIS) 5 MG TABS tablet Take 1 tablet (5 mg total) by mouth 2 (two) times daily. 60 tablet 0   carboxymethylcellulose (REFRESH PLUS) 0.5 % SOLN 1 drop 3 (three) times daily as needed.     conjugated estrogens (PREMARIN) vaginal cream Place 1 Applicatorful vaginally 2 (two) times a week. 1 gram vaginally at bedtime twice weekly 30 g 3    diltiazem (CARDIZEM CD) 240 MG 24 hr capsule Take 1 capsule (240 mg total) by mouth daily. 90 capsule 3   furosemide (LASIX) 40 MG tablet Take 1 tablet (40 mg total) by mouth daily. 90 tablet 3   glimepiride (AMARYL) 4 MG tablet TAKE 1 TABLET(4 MG) BY MOUTH DAILY BEFORE BREAKFAST 90 tablet 1   glucose blood (ONETOUCH ULTRA) test strip USE AS DIRECTED 100 strip 3   losartan (COZAAR) 25 MG tablet TAKE 1 TABLET(25 MG) BY MOUTH DAILY 30 tablet 5   metoprolol succinate (TOPROL-XL) 100 MG 24 hr tablet TAKE 1 TABLET(100 MG) BY MOUTH DAILY WITH OR IMMEDIATELY FOLLOWING A MEAL 90 tablet 0   Multiple Vitamins-Minerals (PRESERVISION AREDS 2 PO) Take by mouth.     pantoprazole (PROTONIX) 40 MG tablet TAKE 1 TABLET(40 MG) BY MOUTH DAILY 90 tablet 3   potassium chloride SA (KLOR-CON M) 20 MEQ tablet TAKE 1 TABLET(20 MEQ) BY MOUTH DAILY 30 tablet 3   simvastatin (ZOCOR) 40 MG tablet Take 1 tablet (40 mg total) by mouth daily. 90 tablet 3   No current facility-administered medications for this visit.   PHYSICAL EXAM:  General:  Well appearing. No resp difficulty HEENT: normal Neck: supple. JVP flat. Carotids 2+ bilaterally; no bruits. No lymphadenopathy or thryomegaly appreciated. Cor: PMI normal. Regular rate & rhythm. No rubs, gallops or murmurs. Lungs: clear Abdomen: soft, nontender, nondistended. No hepatosplenomegaly. No bruits or masses. Good bowel sounds. Extremities: no cyanosis, clubbing, rash, edema Neuro: alert & orientedx3, cranial nerves grossly intact. Moves all 4 extremities w/o difficulty. Affect pleasant.   ECG:   ASSESSMENT & PLAN:  1: Chronic heart failure with preserved ejection fraction with LVH- - suspect due to  - NYHA class II - euvolemic today - weighing daily and reports a continued gradual weight loss; reminded to call for an overnight weight gain of > 2 pounds or a weekly weight gain of > 5 pounds - weight 241.4 pounds from last visit here 6 months ago - echo 03/09/22:  EF of 50-55% along with moderate LVH and moderate MR.  - not adding salt to her food and has been trying to follow a low sodium diet - continue furosemide 40mg  daily - continue losartan 25mg  daily - continue metoprolol succinate 100mg  daily - has a history of UTI's so not a good candidate for SGLT2 - saw pulmonology Karna Christmas) 08/24 - BNP 03/08/22 was 495.3  2: HTN- - BP  - saw PCP (Bronstein) 07/24 - BMP 02/05/23 reviewed and showed sodium 139, potassium 3.5, creatinine 0.82 & GFR ?60  3: DM- - A1c 10/24/22 was 6.8% - home glucose today was   4: Atrial fibrillation- - currently rate controlled - continue diltiazem 240mg  daily - continue apixaban 5mg  BID - saw cardiology Juliann Pares) 05/24

## 2023-02-12 ENCOUNTER — Encounter: Payer: Medicare Other | Admitting: Family

## 2023-02-12 ENCOUNTER — Encounter: Payer: Self-pay | Admitting: Family

## 2023-02-12 ENCOUNTER — Ambulatory Visit: Payer: Medicare Other | Attending: Family | Admitting: Family

## 2023-02-12 VITALS — BP 122/72 | HR 76 | Wt 242.0 lb

## 2023-02-12 DIAGNOSIS — N182 Chronic kidney disease, stage 2 (mild): Secondary | ICD-10-CM | POA: Diagnosis not present

## 2023-02-12 DIAGNOSIS — I130 Hypertensive heart and chronic kidney disease with heart failure and stage 1 through stage 4 chronic kidney disease, or unspecified chronic kidney disease: Secondary | ICD-10-CM | POA: Diagnosis not present

## 2023-02-12 DIAGNOSIS — E1122 Type 2 diabetes mellitus with diabetic chronic kidney disease: Secondary | ICD-10-CM

## 2023-02-12 DIAGNOSIS — I4891 Unspecified atrial fibrillation: Secondary | ICD-10-CM | POA: Diagnosis not present

## 2023-02-12 DIAGNOSIS — J811 Chronic pulmonary edema: Secondary | ICD-10-CM | POA: Diagnosis present

## 2023-02-12 DIAGNOSIS — R0602 Shortness of breath: Secondary | ICD-10-CM | POA: Diagnosis present

## 2023-02-12 DIAGNOSIS — I5032 Chronic diastolic (congestive) heart failure: Secondary | ICD-10-CM | POA: Diagnosis not present

## 2023-02-12 DIAGNOSIS — I10 Essential (primary) hypertension: Secondary | ICD-10-CM

## 2023-02-12 DIAGNOSIS — I110 Hypertensive heart disease with heart failure: Secondary | ICD-10-CM | POA: Insufficient documentation

## 2023-02-12 DIAGNOSIS — E119 Type 2 diabetes mellitus without complications: Secondary | ICD-10-CM | POA: Diagnosis not present

## 2023-02-12 DIAGNOSIS — I480 Paroxysmal atrial fibrillation: Secondary | ICD-10-CM | POA: Diagnosis not present

## 2023-02-12 NOTE — Progress Notes (Signed)
PCP: Dorothey Baseman, MD (last seen 07/24) Primary Cardiologist: Dorothyann Peng, MD (last seen 05/24)  HPI:   Victoria Dalton is a 89 y/o female with a history of T2DM, hyperlipidemia, HTN, CKD, GERD, atrial fibrillation, kidney stone, frequent UTI's and chronic heart failure.   Admitted 03/08/22 due to SOB on exertion. Found to have new onset AF along with pulmonary edema on CXR. Cardiology consult obtained. Discharged after 4 days. Was in the ED 02/05/23 due to postmenopausal vaginal bleeding. Transvaginal ultrasound normal. Hemoglobin 14.1.   Echo 03/09/22: EF of 50-55% along with moderate LVH and moderate MR.   She presents today for a HF follow-up visit with a chief complaint of minimal SOB with moderate exertion. Chronic in nature. Has associated fatigue along with this. Denies chest pain, palpitations, abdominal distention, pedal edema, dizziness, blurry vision, difficulty sleeping or weight gain. Did hit her left shin on something when she fell out of bed in June and it still hasn't completely healed yet. Has upcoming ID appt on 02/25/23   Checks her BP at home and gets 140-150's/ 80's. Losartan and potassium supplements were stopped  ROS: All systems negative except as listed in HPI, PMH and Problem List.  SH:  Social History   Socioeconomic History   Marital status: Widowed    Spouse name: Not on file   Number of children: Not on file   Years of education: Not on file   Highest education level: Not on file  Occupational History   Not on file  Tobacco Use   Smoking status: Never   Smokeless tobacco: Never  Vaping Use   Vaping status: Never Used  Substance and Sexual Activity   Alcohol use: No   Drug use: No   Sexual activity: Not Currently  Other Topics Concern   Not on file  Social History Narrative   Not on file   Social Determinants of Health   Financial Resource Strain: Low Risk  (02/21/2021)   Overall Financial Resource Strain (CARDIA)    Difficulty of Paying  Living Expenses: Not very hard  Food Insecurity: No Food Insecurity (02/21/2021)   Hunger Vital Sign    Worried About Running Out of Food in the Last Year: Never true    Ran Out of Food in the Last Year: Never true  Transportation Needs: No Transportation Needs (02/21/2021)   PRAPARE - Administrator, Civil Service (Medical): No    Lack of Transportation (Non-Medical): No  Physical Activity: Inactive (02/21/2021)   Exercise Vital Sign    Days of Exercise per Week: 0 days    Minutes of Exercise per Session: 0 min  Stress: No Stress Concern Present (02/21/2021)   [employer] of Occupational Health - Occupational Stress Questionnaire    Feeling of Stress : Not at all  Social Connections: Socially Isolated (02/21/2021)   Social Connection and Isolation Panel [NHANES]    Frequency of Communication with Friends and Family: More than three times a week    Frequency of Social Gatherings with Friends and Family: More than three times a week    Attends Religious Services: Never    Database administrator or Organizations: No    Attends Banker Meetings: Never    Marital Status: Widowed  Intimate Partner Violence: Not At Risk (02/21/2021)   Humiliation, Afraid, Rape, and Kick questionnaire    Fear of Current or Ex-Partner: No    Emotionally Abused: No    Physically Abused: No  Sexually Abused: No    FH:  Family History  Problem Relation Age of Onset   Cancer Father    Heart attack Father    Cancer Brother    Diabetes Brother    Diabetes Son    Breast cancer Neg Hx     Past Medical History:  Diagnosis Date   CHF (congestive heart failure) (HCC)    Diabetes mellitus without complication (HCC)    Dyspnea    Gallbladder calculus    GERD (gastroesophageal reflux disease)    Hyperlipidemia    Hypertension    Hypertensive retinopathy    OU   Kidney stone    Macular degeneration    Dry OU   Mass    Parotid swelling    UTI (lower urinary tract  infection)     Current Outpatient Medications  Medication Sig Dispense Refill   acetaminophen (TYLENOL) 500 MG tablet Take 500 mg by mouth every 6 (six) hours as needed for mild pain. (Patient not taking: Reported on 08/12/2022)     apixaban (ELIQUIS) 5 MG TABS tablet Take 1 tablet (5 mg total) by mouth 2 (two) times daily. 60 tablet 0   carboxymethylcellulose (REFRESH PLUS) 0.5 % SOLN 1 drop 3 (three) times daily as needed.     conjugated estrogens (PREMARIN) vaginal cream Place 1 Applicatorful vaginally 2 (two) times a week. 1 gram vaginally at bedtime twice weekly 30 g 3   diltiazem (CARDIZEM CD) 240 MG 24 hr capsule Take 1 capsule (240 mg total) by mouth daily. 90 capsule 3   furosemide (LASIX) 40 MG tablet Take 1 tablet (40 mg total) by mouth daily. 90 tablet 3   glimepiride (AMARYL) 4 MG tablet TAKE 1 TABLET(4 MG) BY MOUTH DAILY BEFORE BREAKFAST 90 tablet 1   glucose blood (ONETOUCH ULTRA) test strip USE AS DIRECTED 100 strip 3   losartan (COZAAR) 25 MG tablet TAKE 1 TABLET(25 MG) BY MOUTH DAILY 30 tablet 5   metoprolol succinate (TOPROL-XL) 100 MG 24 hr tablet TAKE 1 TABLET(100 MG) BY MOUTH DAILY WITH OR IMMEDIATELY FOLLOWING A MEAL 90 tablet 0   Multiple Vitamins-Minerals (PRESERVISION AREDS 2 PO) Take by mouth.     pantoprazole (PROTONIX) 40 MG tablet TAKE 1 TABLET(40 MG) BY MOUTH DAILY 90 tablet 3   potassium chloride SA (KLOR-CON M) 20 MEQ tablet TAKE 1 TABLET(20 MEQ) BY MOUTH DAILY 30 tablet 3   simvastatin (ZOCOR) 40 MG tablet Take 1 tablet (40 mg total) by mouth daily. 90 tablet 3   No current facility-administered medications for this visit.   Vitals:   02/12/23 1129 02/12/23 1201  BP: (!) 88/50 122/72  Pulse: 76   SpO2: 95%   Weight: 242 lb (109.8 kg)    Wt Readings from Last 3 Encounters:  02/12/23 242 lb (109.8 kg)  02/05/23 240 lb (108.9 kg)  08/12/22 241 lb 6.4 oz (109.5 kg)   Lab Results  Component Value Date   CREATININE 0.82 02/05/2023   CREATININE 1.19 (H)  08/28/2022   CREATININE 1.06 (H) 08/12/2022    PHYSICAL EXAM:  General:  Well appearing. No resp difficulty HEENT: normal Neck: supple. JVP flat. No lymphadenopathy or thryomegaly appreciated. Cor: PMI normal. Regular rate & rhythm. No rubs, gallops or murmurs. Lungs: clear Abdomen: soft, nontender, nondistended. No hepatosplenomegaly. No bruits or masses. Extremities: no cyanosis, clubbing, rash, edema; nonhealed wound on left shin with slight redness around puncture site Neuro: alert & oriented x3, cranial nerves grossly intact. Moves all 4  extremities w/o difficulty. Affect pleasant.   ECG: not done   ASSESSMENT & PLAN:  1: Chronic heart failure with preserved ejection fraction- - suspect due to atrial fibrillation - NYHA class II - euvolemic today - weighing daily; reminded to call for an overnight weight gain of > 2 pounds or a weekly weight gain of > 5 pounds - weight stable from last visit here 6 months ago - echo 03/09/22: EF of 50-55% along with moderate LVH and moderate MR.  - not adding salt to her food and has been trying to follow a low sodium diet - continue furosemide 40mg  daily - continue metoprolol succinate 100mg  daily - has a history of UTI's so not a good candidate for SGLT2 - saw pulmonology Karna Christmas) 08/24 - BNP 03/08/22 was 495.3  2: HTN- - BP initially low at 88/50 but then rechecked with manual cuff at end of the visit it was 122/72 - saw PCP Terance Hart) 07/24 - BMP 02/05/23 reviewed and showed sodium 139, potassium 3.5, creatinine 0.82 & GFR >60  3: DM- - A1c 10/24/22 was 6.8% - has to replaced her battery in her glucometer  4: Atrial fibrillation- - currently rate controlled - continue diltiazem 240mg  daily - continue apixaban 5mg  BID - continue metoprolol 100mg  daily - saw cardiology Juliann Pares) 05/24  Return in 6 months, sooner if needed.

## 2023-02-25 DIAGNOSIS — Z23 Encounter for immunization: Secondary | ICD-10-CM | POA: Diagnosis not present

## 2023-03-11 DIAGNOSIS — R319 Hematuria, unspecified: Secondary | ICD-10-CM | POA: Diagnosis not present

## 2023-03-11 DIAGNOSIS — Z1389 Encounter for screening for other disorder: Secondary | ICD-10-CM | POA: Diagnosis not present

## 2023-03-11 DIAGNOSIS — N390 Urinary tract infection, site not specified: Secondary | ICD-10-CM | POA: Diagnosis not present

## 2023-03-11 DIAGNOSIS — I10 Essential (primary) hypertension: Secondary | ICD-10-CM | POA: Diagnosis not present

## 2023-03-11 DIAGNOSIS — E1122 Type 2 diabetes mellitus with diabetic chronic kidney disease: Secondary | ICD-10-CM | POA: Diagnosis not present

## 2023-03-11 DIAGNOSIS — Z0000 Encounter for general adult medical examination without abnormal findings: Secondary | ICD-10-CM | POA: Diagnosis not present

## 2023-03-11 DIAGNOSIS — E119 Type 2 diabetes mellitus without complications: Secondary | ICD-10-CM | POA: Diagnosis not present

## 2023-03-11 DIAGNOSIS — E785 Hyperlipidemia, unspecified: Secondary | ICD-10-CM | POA: Diagnosis not present

## 2023-03-11 DIAGNOSIS — I110 Hypertensive heart disease with heart failure: Secondary | ICD-10-CM | POA: Diagnosis not present

## 2023-03-11 DIAGNOSIS — I4891 Unspecified atrial fibrillation: Secondary | ICD-10-CM | POA: Diagnosis not present

## 2023-03-11 DIAGNOSIS — I509 Heart failure, unspecified: Secondary | ICD-10-CM | POA: Diagnosis not present

## 2023-03-11 DIAGNOSIS — N182 Chronic kidney disease, stage 2 (mild): Secondary | ICD-10-CM | POA: Diagnosis not present

## 2023-03-25 ENCOUNTER — Other Ambulatory Visit: Payer: Self-pay

## 2023-03-25 ENCOUNTER — Emergency Department: Payer: Medicare Other

## 2023-03-25 ENCOUNTER — Encounter: Payer: Self-pay | Admitting: Intensive Care

## 2023-03-25 ENCOUNTER — Inpatient Hospital Stay
Admission: EM | Admit: 2023-03-25 | Discharge: 2023-03-27 | DRG: 871 | Disposition: A | Discharge status: Home / Self Care | Payer: Medicare Other | Attending: Osteopathic Medicine | Admitting: Osteopathic Medicine

## 2023-03-25 DIAGNOSIS — E669 Obesity, unspecified: Secondary | ICD-10-CM | POA: Diagnosis present

## 2023-03-25 DIAGNOSIS — A419 Sepsis, unspecified organism: Principal | ICD-10-CM | POA: Diagnosis present

## 2023-03-25 DIAGNOSIS — R531 Weakness: Secondary | ICD-10-CM | POA: Diagnosis not present

## 2023-03-25 DIAGNOSIS — E119 Type 2 diabetes mellitus without complications: Secondary | ICD-10-CM

## 2023-03-25 DIAGNOSIS — J9601 Acute respiratory failure with hypoxia: Secondary | ICD-10-CM

## 2023-03-25 DIAGNOSIS — Z79899 Other long term (current) drug therapy: Secondary | ICD-10-CM | POA: Diagnosis not present

## 2023-03-25 DIAGNOSIS — K219 Gastro-esophageal reflux disease without esophagitis: Secondary | ICD-10-CM | POA: Diagnosis present

## 2023-03-25 DIAGNOSIS — Z8249 Family history of ischemic heart disease and other diseases of the circulatory system: Secondary | ICD-10-CM | POA: Diagnosis not present

## 2023-03-25 DIAGNOSIS — E1122 Type 2 diabetes mellitus with diabetic chronic kidney disease: Secondary | ICD-10-CM | POA: Diagnosis not present

## 2023-03-25 DIAGNOSIS — N952 Postmenopausal atrophic vaginitis: Secondary | ICD-10-CM | POA: Diagnosis present

## 2023-03-25 DIAGNOSIS — I482 Chronic atrial fibrillation, unspecified: Secondary | ICD-10-CM

## 2023-03-25 DIAGNOSIS — Z888 Allergy status to other drugs, medicaments and biological substances status: Secondary | ICD-10-CM

## 2023-03-25 DIAGNOSIS — H353130 Nonexudative age-related macular degeneration, bilateral, stage unspecified: Secondary | ICD-10-CM | POA: Diagnosis present

## 2023-03-25 DIAGNOSIS — I5033 Acute on chronic diastolic (congestive) heart failure: Secondary | ICD-10-CM | POA: Diagnosis not present

## 2023-03-25 DIAGNOSIS — N390 Urinary tract infection, site not specified: Secondary | ICD-10-CM

## 2023-03-25 DIAGNOSIS — Z7984 Long term (current) use of oral hypoglycemic drugs: Secondary | ICD-10-CM

## 2023-03-25 DIAGNOSIS — H35033 Hypertensive retinopathy, bilateral: Secondary | ICD-10-CM | POA: Diagnosis present

## 2023-03-25 DIAGNOSIS — I130 Hypertensive heart and chronic kidney disease with heart failure and stage 1 through stage 4 chronic kidney disease, or unspecified chronic kidney disease: Secondary | ICD-10-CM | POA: Diagnosis not present

## 2023-03-25 DIAGNOSIS — Z882 Allergy status to sulfonamides status: Secondary | ICD-10-CM

## 2023-03-25 DIAGNOSIS — Z833 Family history of diabetes mellitus: Secondary | ICD-10-CM

## 2023-03-25 DIAGNOSIS — M25551 Pain in right hip: Secondary | ICD-10-CM | POA: Diagnosis present

## 2023-03-25 DIAGNOSIS — I700 Atherosclerosis of aorta: Secondary | ICD-10-CM | POA: Diagnosis not present

## 2023-03-25 DIAGNOSIS — N183 Chronic kidney disease, stage 3 unspecified: Secondary | ICD-10-CM | POA: Diagnosis not present

## 2023-03-25 DIAGNOSIS — R0602 Shortness of breath: Secondary | ICD-10-CM | POA: Diagnosis not present

## 2023-03-25 DIAGNOSIS — I517 Cardiomegaly: Secondary | ICD-10-CM | POA: Diagnosis not present

## 2023-03-25 DIAGNOSIS — Z9049 Acquired absence of other specified parts of digestive tract: Secondary | ICD-10-CM | POA: Diagnosis not present

## 2023-03-25 DIAGNOSIS — A4150 Gram-negative sepsis, unspecified: Secondary | ICD-10-CM | POA: Diagnosis not present

## 2023-03-25 DIAGNOSIS — Z1152 Encounter for screening for COVID-19: Secondary | ICD-10-CM | POA: Diagnosis not present

## 2023-03-25 DIAGNOSIS — Z880 Allergy status to penicillin: Secondary | ICD-10-CM

## 2023-03-25 DIAGNOSIS — N3001 Acute cystitis with hematuria: Secondary | ICD-10-CM | POA: Diagnosis not present

## 2023-03-25 DIAGNOSIS — R0902 Hypoxemia: Secondary | ICD-10-CM

## 2023-03-25 DIAGNOSIS — Z6835 Body mass index (BMI) 35.0-35.9, adult: Secondary | ICD-10-CM

## 2023-03-25 DIAGNOSIS — G8929 Other chronic pain: Secondary | ICD-10-CM | POA: Diagnosis not present

## 2023-03-25 DIAGNOSIS — Z87442 Personal history of urinary calculi: Secondary | ICD-10-CM | POA: Diagnosis not present

## 2023-03-25 DIAGNOSIS — Z7901 Long term (current) use of anticoagulants: Secondary | ICD-10-CM

## 2023-03-25 DIAGNOSIS — R509 Fever, unspecified: Secondary | ICD-10-CM | POA: Diagnosis not present

## 2023-03-25 DIAGNOSIS — E785 Hyperlipidemia, unspecified: Secondary | ICD-10-CM

## 2023-03-25 DIAGNOSIS — B9620 Unspecified Escherichia coli [E. coli] as the cause of diseases classified elsewhere: Secondary | ICD-10-CM | POA: Diagnosis present

## 2023-03-25 DIAGNOSIS — R0689 Other abnormalities of breathing: Secondary | ICD-10-CM | POA: Diagnosis not present

## 2023-03-25 HISTORY — DX: Unspecified atrial fibrillation: I48.91

## 2023-03-25 LAB — URINALYSIS, W/ REFLEX TO CULTURE (INFECTION SUSPECTED)
Bilirubin Urine: NEGATIVE
Glucose, UA: NEGATIVE mg/dL
Ketones, ur: NEGATIVE mg/dL
Nitrite: POSITIVE — AB
Protein, ur: NEGATIVE mg/dL
Specific Gravity, Urine: 1.04 — ABNORMAL HIGH (ref 1.005–1.030)
WBC, UA: >50 WBC/hpf (ref 0–5)
pH: 5 (ref 5.0–8.0)

## 2023-03-25 LAB — CBC WITH DIFFERENTIAL/PLATELET
Abs Immature Granulocytes: 0.05 10*3/uL (ref 0.00–0.07)
Basophils Absolute: 0.1 10*3/uL (ref 0.0–0.1)
Basophils Relative: 1 %
Eosinophils Absolute: 0 10*3/uL (ref 0.0–0.5)
Eosinophils Relative: 0 %
HCT: 39.4 % (ref 36.0–46.0)
Hemoglobin: 13.3 g/dL (ref 12.0–15.0)
Immature Granulocytes: 0 %
Lymphocytes Relative: 4 %
Lymphs Abs: 0.5 10*3/uL — ABNORMAL LOW (ref 0.7–4.0)
MCH: 30.3 pg (ref 26.0–34.0)
MCHC: 33.8 g/dL (ref 30.0–36.0)
MCV: 89.7 fL (ref 80.0–100.0)
Monocytes Absolute: 0.6 10*3/uL (ref 0.1–1.0)
Monocytes Relative: 5 %
Neutro Abs: 10.4 10*3/uL — ABNORMAL HIGH (ref 1.7–7.7)
Neutrophils Relative %: 90 %
Platelets: 151 10*3/uL (ref 150–400)
RBC: 4.39 MIL/uL (ref 3.87–5.11)
RDW: 13.2 % (ref 11.5–15.5)
WBC: 11.7 10*3/uL — ABNORMAL HIGH (ref 4.0–10.5)
nRBC: 0 % (ref 0.0–0.2)

## 2023-03-25 LAB — COMPREHENSIVE METABOLIC PANEL
ALT: 38 U/L (ref 0–44)
AST: 56 U/L — ABNORMAL HIGH (ref 15–41)
Albumin: 4 g/dL (ref 3.5–5.0)
Alkaline Phosphatase: 132 U/L — ABNORMAL HIGH (ref 38–126)
Anion gap: 11 (ref 5–15)
BUN: 26 mg/dL — ABNORMAL HIGH (ref 8–23)
CO2: 26 mmol/L (ref 22–32)
Calcium: 8.5 mg/dL — ABNORMAL LOW (ref 8.9–10.3)
Chloride: 102 mmol/L (ref 98–111)
Creatinine, Ser: 0.94 mg/dL (ref 0.44–1.00)
GFR, Estimated: 58 mL/min — ABNORMAL LOW (ref >60)
Glucose, Bld: 134 mg/dL — ABNORMAL HIGH (ref 70–99)
Potassium: 4.8 mmol/L (ref 3.5–5.1)
Sodium: 139 mmol/L (ref 135–145)
Total Bilirubin: 1.6 mg/dL — ABNORMAL HIGH (ref 0.3–1.2)
Total Protein: 6.9 g/dL (ref 6.5–8.1)

## 2023-03-25 LAB — LACTIC ACID, PLASMA
Lactic Acid, Venous: 1.6 mmol/L (ref 0.5–1.9)
Lactic Acid, Venous: 1.8 mmol/L (ref 0.5–1.9)

## 2023-03-25 LAB — BRAIN NATRIURETIC PEPTIDE: B Natriuretic Peptide: 258.3 pg/mL — ABNORMAL HIGH (ref 0.0–100.0)

## 2023-03-25 LAB — RESP PANEL BY RT-PCR (RSV, FLU A&B, COVID)  RVPGX2
Influenza A by PCR: NEGATIVE
Influenza B by PCR: NEGATIVE
Resp Syncytial Virus by PCR: NEGATIVE
SARS Coronavirus 2 by RT PCR: NEGATIVE

## 2023-03-25 LAB — CBG MONITORING, ED: Glucose-Capillary: 161 mg/dL — ABNORMAL HIGH (ref 70–99)

## 2023-03-25 LAB — PROTIME-INR
INR: 1.3 — ABNORMAL HIGH (ref 0.8–1.2)
Prothrombin Time: 16.1 s — ABNORMAL HIGH (ref 11.4–15.2)

## 2023-03-25 LAB — PROCALCITONIN: Procalcitonin: 0.47 ng/mL

## 2023-03-25 MED ORDER — ONDANSETRON HCL 4 MG/2ML IJ SOLN: 4.0000 mg | Freq: Four times a day (QID) | INTRAMUSCULAR | Status: DC | PRN

## 2023-03-25 MED ORDER — PANTOPRAZOLE SODIUM 40 MG PO TBEC
40.0000 mg | DELAYED_RELEASE_TABLET | Freq: Every day | ORAL | Status: DC
  Administered 2023-03-26 – 2023-03-27 (×2): 40 mg via ORAL
  Filled 2023-03-25 (×2): qty 1

## 2023-03-25 MED ORDER — LACTATED RINGERS IV SOLN
150.0000 mL/h | INTRAVENOUS | Status: DC
  Administered 2023-03-26: 150 mL/h via INTRAVENOUS

## 2023-03-25 MED ORDER — MAGNESIUM HYDROXIDE 400 MG/5ML PO SUSP: 30.0000 mL | Freq: Every day | ORAL | Status: DC | PRN

## 2023-03-25 MED ORDER — CEFTRIAXONE SODIUM 2 G IJ SOLR
2.0000 g | INTRAMUSCULAR | Status: DC
  Administered 2023-03-26 (×2): 2 g via INTRAVENOUS
  Filled 2023-03-25: qty 20

## 2023-03-25 MED ORDER — SODIUM CHLORIDE 0.9 % IV SOLN
2.0000 g | Freq: Once | INTRAVENOUS | Status: DC
  Filled 2023-03-25: qty 20

## 2023-03-25 MED ORDER — ACETAMINOPHEN 325 MG PO TABS
650.0000 mg | ORAL_TABLET | Freq: Four times a day (QID) | ORAL | Status: DC | PRN
  Administered 2023-03-26 – 2023-03-27 (×2): 650 mg via ORAL
  Filled 2023-03-25 (×3): qty 2

## 2023-03-25 MED ORDER — DILTIAZEM HCL ER COATED BEADS 120 MG PO CP24
240.0000 mg | ORAL_CAPSULE | Freq: Every day | ORAL | Status: DC
  Administered 2023-03-26 – 2023-03-27 (×2): 240 mg via ORAL
  Filled 2023-03-25: qty 1
  Filled 2023-03-25: qty 2
  Filled 2023-03-25: qty 1

## 2023-03-25 MED ORDER — LOSARTAN POTASSIUM 50 MG PO TABS: 25.0000 mg | ORAL_TABLET | Freq: Every day | ORAL | Status: DC

## 2023-03-25 MED ORDER — POLYVINYL ALCOHOL 1.4 % OP SOLN: 1.0000 [drp] | Freq: Three times a day (TID) | OPHTHALMIC | Status: DC | PRN

## 2023-03-25 MED ORDER — GLIMEPIRIDE 4 MG PO TABS
4.0000 mg | ORAL_TABLET | Freq: Every day | ORAL | Status: DC
  Administered 2023-03-26 – 2023-03-27 (×2): 4 mg via ORAL
  Filled 2023-03-25 (×3): qty 1

## 2023-03-25 MED ORDER — ONDANSETRON HCL 4 MG PO TABS: 4.0000 mg | ORAL_TABLET | Freq: Four times a day (QID) | ORAL | Status: DC | PRN

## 2023-03-25 MED ORDER — LACTATED RINGERS IV BOLUS
1000.0000 mL | Freq: Once | INTRAVENOUS | Status: AC
  Administered 2023-03-25: 1000 mL via INTRAVENOUS

## 2023-03-25 MED ORDER — SODIUM CHLORIDE 0.9 % IV SOLN
500.0000 mg | Freq: Once | INTRAVENOUS | Status: AC
  Administered 2023-03-26: 500 mg via INTRAVENOUS
  Filled 2023-03-25: qty 5

## 2023-03-25 MED ORDER — IOHEXOL 350 MG/ML SOLN
100.0000 mL | Freq: Once | INTRAVENOUS | Status: AC | PRN
  Administered 2023-03-25: 100 mL via INTRAVENOUS

## 2023-03-25 MED ORDER — ENOXAPARIN SODIUM 40 MG/0.4ML IJ SOSY: 40.0000 mg | PREFILLED_SYRINGE | INTRAMUSCULAR | Status: DC

## 2023-03-25 MED ORDER — ACETAMINOPHEN 650 MG RE SUPP: 650.0000 mg | Freq: Four times a day (QID) | RECTAL | Status: DC | PRN

## 2023-03-25 MED ORDER — METOPROLOL SUCCINATE ER 100 MG PO TB24
100.0000 mg | ORAL_TABLET | Freq: Every day | ORAL | Status: DC
  Filled 2023-03-25: qty 2
  Filled 2023-03-25: qty 1

## 2023-03-25 MED ORDER — TRAZODONE HCL 50 MG PO TABS: 25.0000 mg | ORAL_TABLET | Freq: Every evening | ORAL | Status: DC | PRN

## 2023-03-25 MED ORDER — APIXABAN 5 MG PO TABS
5.0000 mg | ORAL_TABLET | Freq: Two times a day (BID) | ORAL | Status: DC
  Administered 2023-03-26 – 2023-03-27 (×3): 5 mg via ORAL
  Filled 2023-03-25 (×3): qty 1

## 2023-03-25 MED ORDER — FUROSEMIDE 10 MG/ML IJ SOLN
40.0000 mg | Freq: Once | INTRAMUSCULAR | Status: AC
  Administered 2023-03-25: 40 mg via INTRAVENOUS
  Filled 2023-03-25: qty 4

## 2023-03-25 NOTE — ED Provider Notes (Signed)
[facility] Provider Note    Event Date/Time   First MD Initiated Contact with Patient 03/25/23 4098     (approximate)   History   Fever and Urinary Tract Infection   HPI Victoria Dalton is a 89 y.o. female with history of HTN, DM2, HLD, CKD stage III, HFpEF, A-fib on Eliquis presenting today for fever.  Patient states over the past day she has had some worsening weakness and fatigue.  She is also felt short of breath and difficulty with ambulation.  New fevers and chills today.  Has also had some increased bowel movements but denies any diarrhea.  Otherwise denies chest pain, cough, congestion, abdominal pain, nausea, vomiting, leg pain, leg swelling.  Patient reports she has been seen outpatient several times recently for possible UTI and was recently on a medication but stopped that a couple of days ago due to a burning sensation after taking the medication.  Still does report some dysuria symptoms at this time.  No missed doses of her Eliquis.  Chart review: Reviewed most recent primary care provider note on 03/11/2023.     Physical Exam   Triage Vital Signs: ED Triage Vitals  Encounter Vitals Group     BP 03/25/23 1808 (!) 140/99     Systolic BP Percentile --      Diastolic BP Percentile --      Pulse Rate 03/25/23 1808 86     Resp 03/25/23 1808 (!) 22     Temp 03/25/23 1808 99.4 F (37.4 C)     Temp Source 03/25/23 1808 Oral     SpO2 03/25/23 1807 (!) 86 %     Weight 03/25/23 1808 241 lb (109.3 kg)     Height 03/25/23 1808 5\' 9"  (1.753 m)     Head Circumference --      Peak Flow --      Pain Score 03/25/23 1808 0     Pain Loc --      Pain Education --      Exclude from Growth Chart --     Most recent vital signs: Vitals:   03/25/23 1812 03/25/23 1945  BP:  118/64  Pulse:  (!) 102  Resp:  (!) 21  Temp:  98.5 F (36.9 C)  SpO2: 93% 94%   Physical Exam: I have reviewed the vital signs and nursing notes. General: Awake, alert, no  acute distress.  Nontoxic appearing. Head:  Atraumatic, normocephalic.   ENT:  EOM intact, PERRL. Oral mucosa is pink and moist with no lesions. Neck: Neck is supple with full range of motion, No meningeal signs. Cardiovascular:  RRR, No murmurs. Peripheral pulses palpable and equal bilaterally. Respiratory:  Symmetrical chest wall expansion.  No rhonchi, rales, or wheezes.  Good air movement throughout.  No use of accessory muscles.  Mild tachypnea.  Diminished breath sounds in the bases bilaterally. Musculoskeletal:  No cyanosis or edema. Moving extremities with full ROM Abdomen:  Soft, nontender, nondistended. Neuro:  GCS 15, moving all four extremities, interacting appropriately. Speech clear. Psych:  Calm, appropriate.   Skin:  Warm, dry, no rash.    ED Results / Procedures / Treatments   Labs (all labs ordered are listed, but only abnormal results are displayed) Labs Reviewed  COMPREHENSIVE METABOLIC PANEL - Abnormal; Notable for the following components:      Result Value   Glucose, Bld 134 (*)    BUN 26 (*)    Calcium 8.5 (*)  AST 56 (*)    Alkaline Phosphatase 132 (*)    Total Bilirubin 1.6 (*)    GFR, Estimated 58 (*)    All other components within normal limits  PROTIME-INR - Abnormal; Notable for the following components:   Prothrombin Time 16.1 (*)    INR 1.3 (*)    All other components within normal limits  URINALYSIS, W/ REFLEX TO CULTURE (INFECTION SUSPECTED) - Abnormal; Notable for the following components:   Color, Urine YELLOW (*)    APPearance HAZY (*)    Specific Gravity, Urine 1.040 (*)    Hgb urine dipstick SMALL (*)    Nitrite POSITIVE (*)    Leukocytes,Ua MODERATE (*)    Bacteria, UA MANY (*)    All other components within normal limits  CBC WITH DIFFERENTIAL/PLATELET - Abnormal; Notable for the following components:   WBC 11.7 (*)    Neutro Abs 10.4 (*)    Lymphs Abs 0.5 (*)    All other components within normal limits  BRAIN NATRIURETIC  PEPTIDE - Abnormal; Notable for the following components:   B Natriuretic Peptide 258.3 (*)    All other components within normal limits  RESP PANEL BY RT-PCR (RSV, FLU A&B, COVID)  [id number]  CULTURE, BLOOD (ROUTINE X 2)  CULTURE, BLOOD (ROUTINE X 2)  URINE CULTURE  LACTIC ACID, PLASMA  LACTIC ACID, PLASMA  PROCALCITONIN  CBC WITH DIFFERENTIAL/PLATELET     EKG My EKG interpretation: A-fib with rate of 91.  Normal axis.  Right bundle branch block.  No acute ST elevation or depression   RADIOLOGY Independently interpreted chest x-ray and CTA chest with no acute abnormalities   PROCEDURES:  Critical Care performed: Yes, see critical care procedure note(s)  .Critical Care  Performed by: Janith Lima, MD Authorized by: Janith Lima, MD   Critical care provider statement:    Critical care time (minutes):  30   Critical care was necessary to treat or prevent imminent or life-threatening deterioration of the following conditions:  Sepsis   Critical care was time spent personally by me on the following activities:  Development of treatment plan with patient or surrogate, discussions with consultants, evaluation of patient's response to treatment, examination of patient, ordering and review of laboratory studies, ordering and review of radiographic studies, ordering and performing treatments and interventions, pulse oximetry, re-evaluation of patient's condition and review of old charts    MEDICATIONS ORDERED IN ED: Medications  cefTRIAXone (ROCEPHIN) 2 g in sodium chloride 0.9 % 100 mL IVPB (has no administration in time range)  azithromycin (ZITHROMAX) 500 mg in sodium chloride 0.9 % 250 mL IVPB (has no administration in time range)  furosemide (LASIX) injection 40 mg (has no administration in time range)  lactated ringers bolus 1,000 mL (0 mLs Intravenous Stopped 03/25/23 2214)  iohexol (OMNIPAQUE) 350 MG/ML injection 100 mL (100 mLs Intravenous Contrast Given 03/25/23 2039)      IMPRESSION / MDM / ASSESSMENT AND PLAN / ED COURSE  I reviewed the triage vital signs and the nursing notes.                              Differential diagnosis includes, but is not limited to, pneumonia, CHF exacerbation, UTI, sepsis, less likely PE  Patient's presentation is most consistent with acute presentation with potential threat to life or bodily function.  Patient is a 89 year old female presenting today for shortness of breath and found to be  hypoxic on room air.  No initial tachycardia or fever and no hypotension.  Concern for pneumonia as potential source of sepsis versus UTI given her recent history with dysuria.  Initial laboratory workup notable for WBC of 11.7.  CMP largely unremarkable aside from slight elevations in her T. bili and AST but no abdominal tenderness anywhere.  Chest x-ray with no acute findings.  Negative viral swabs.  BNP is slightly elevated to 58 which could potentially contribute to hypoxia, however will follow this up with CTA to rule out PE or occult pneumonia.  CTA shows no evidence of PE anywhere or obvious pneumonia.  UA was positive for UTI.  At this time, suspect a combination of UTI with sepsis and potential heart failure as the source of her hypoxia and symptoms.  Patient was given ceftriaxone azithromycin covering occult pneumonia and the UTI.  Also given Lasix for diuresis.  Patient was admitted to hospitalist for further care.  The patient is on the cardiac monitor to evaluate for evidence of arrhythmia and/or significant heart rate changes. Clinical Course as of 03/25/23 2242  Wed Mar 25, 2023  1917 WBC(!): 11.7 [DW]    Clinical Course User Index [DW] Janith Lima, MD     FINAL CLINICAL IMPRESSION(S) / ED DIAGNOSES   Final diagnoses:  Sepsis, due to unspecified organism, unspecified whether acute organ dysfunction present [facility])  Acute cystitis with hematuria  Hypoxia     Rx / DC Orders   ED Discharge Orders     None         Note:  This document was prepared using Dragon voice recognition software and may include unintentional dictation errors.   Janith Lima, MD 03/25/23 [phone]

## 2023-03-25 NOTE — ED Notes (Signed)
First nurse note:  To ED from home, [facility] Lost control of bladder, urinated on self, felt weak and nauseous around 2pm Skin hot and dry 102.3 oral temp Hx a fib, 90-130 HR. 95% RA, CBG 179, BP 141/78  Was recently put on abx for UTI, then stopped because "it made her bottom raw" and PCP took off abx and did not prescribe other abx  Pt alert and oriented, nausea resolved

## 2023-03-25 NOTE — ED Notes (Addendum)
Leads removed by Ct, RN aware

## 2023-03-25 NOTE — ED Notes (Signed)
This RN helped pt to toilet and back to bed. Pt didn't provide enough specimen to send to lab. Will attempt again later.

## 2023-03-25 NOTE — ED Notes (Signed)
Pt to ct, RN aware

## 2023-03-25 NOTE — ED Notes (Signed)
Pt assisted to toilet. Pt is a three person assist and unable to walk more than three feet to the toilet. Pt became dyspneic during walking. Pt will be placed on a purewick.

## 2023-03-25 NOTE — ED Triage Notes (Signed)
Patient reports recent diagnosis with UTI. Took medication X4 days and reported to MD the medication was causing her to burn in private area and told her to quit taking medication. Was not put on another antibiotic.   Fever started today. Also report Nausea. Reports 102 fever at home and took Tylenol around 4pm.   A&O x4 in triage

## 2023-03-25 NOTE — ED Notes (Signed)
Patient transported to X-ray 

## 2023-03-25 NOTE — H&P (Signed)
[place]   PATIENT NAME: Victoria Dalton    MR#:  [id number]  DATE OF BIRTH:  1931/04/02  DATE OF ADMISSION:  03/25/2023  PRIMARY CARE PHYSICIAN: Dorothey Baseman, MD   Patient is coming from: Home  REQUESTING/REFERRING PHYSICIAN: Claudell Kyle, MD  CHIEF COMPLAINT:   Chief Complaint  Patient presents with   Fever   Urinary Tract Infection    HISTORY OF PRESENT ILLNESS:  Victoria Dalton is a 89 y.o. female with medical history significant for CHF, atrial fibrillation, hypertension, dyslipidemia, and macular degeneration, who presented to the emergency room with acute onset of tactile fever and chills for which she took Tylenol at 8 AM and 4 PM right before arrival of EMS they checked her temperature at the time and it was 102.  She has been having increased fatigue and dyspnea on exertion.  She was found to be hypoxic by EMS with pulse oximetry and the mid 80s here in the ER that came up with 2 L of O2 by nasal cannula to the mid 90s.  She admits to dysuria and urinary frequency and urgency as well as nausea without vomiting.  She denies any chest pain or cough or wheezing.  She has chronic right hip pain.  No worsening lower extremity edema or orthopnea.  No diarrhea or melena or bright red bleeding per rectum.  No bleeding diathesis.  ED Course: When she came to the ER, BP was 136/99 with heart rate 119 with respiratory rate of 22 and temperature 99.3 with pulse oximetry 86% on room air and 93% on 2 L of O2 by nasal cannula and then 94% and later on she was 96 to 98% on room air.  Labs revealed a BNP of 258.3 and CMP remarkable for BUN of 26, calcium 8.5 alk phos of 132, AST 56 and total bili 1.6.  Lactic acid was 1.6 and CBC showed leukocytosis 11.7 with neutrophilia.  INR is 1.3 and PTT 16.1.  Respiratory panel came back negative.  Blood cultures were drawn.  UA was positive for UTI. EKG as reviewed by me : EKG showed suboptimal rhythm likely atrial fibrillation with rate  of 97 with right axis deviation and low voltage QRS with T wave inversion anteroseptally. Repeat EKG confirmed atrial fibrillation with low voltage QRS and right bundle branch block with a rate of 91. Imaging: Portable chest x-ray showed cardiomegaly with improvement from prior study on 10/14. Chest CTA revealed no evidence for PE or aortic dissection.  Showed mild cardiomegaly and aortic atherosclerosis.  The patient was given IV Rocephin and Zithromax, 40 mg of IV Lasix and 1 L bolus of IV lactated ringer.  She will be admitted to a medical telemetry bed for further evaluation and management. PAST MEDICAL HISTORY:   Past Medical History:  Diagnosis Date   Atrial fibrillation (HCC)    CHF (congestive heart failure) (HCC)    Diabetes mellitus without complication (HCC)    Dyspnea    Gallbladder calculus    GERD (gastroesophageal reflux disease)    Hyperlipidemia    Hypertension    Hypertensive retinopathy    OU   Kidney stone    Macular degeneration    Dry OU   Mass    Parotid swelling    UTI (lower urinary tract infection)     PAST SURGICAL HISTORY:   Past Surgical History:  Procedure Laterality Date   CATARACT EXTRACTION Bilateral 02/2020   Dr. Tyrone Schimke   CHOLECYSTECTOMY  N/A 04/30/2016   Procedure: CHOLECYSTECTOMY;  Surgeon: Leafy Ro, MD;  Location: [facility] ORS;  Service: General;  Laterality: N/A;   DIAGNOSTIC LAPAROSCOPIC LIVER BIOPSY  02/28/2016   Procedure: DIAGNOSTIC LAPAROSCOPIC LIVER BIOPSY;  Surgeon: Leafy Ro, MD;  Location: [facility] ORS;  Service: General;;   ERCP N/A 01/13/2016   Procedure: ENDOSCOPIC RETROGRADE CHOLANGIOPANCREATOGRAPHY (ERCP);  Surgeon: Midge Minium, MD;  Location: [facility];  Service: Endoscopy;  Laterality: N/A;   ERCP N/A 04/08/2016   Procedure: ENDOSCOPIC RETROGRADE CHOLANGIOPANCREATOGRAPHY (ERCP) Stent removal;  Surgeon: Midge Minium, MD;  Location: [facility] ENDOSCOPY;  Service: Endoscopy;  Laterality: N/A;   EYE SURGERY Bilateral  02/2020   Cat Sx - Dr. Tyrone Schimke   INTRAOPERATIVE CHOLANGIOGRAM  04/30/2016   Procedure: INTRAOPERATIVE CHOLANGIOGRAM;  Surgeon: Leafy Ro, MD;  Location: [facility] ORS;  Service: General;;   KNEE ARTHROSCOPY Left    LAPAROSCOPY  02/28/2016   Procedure: Diagnotic laparoscopy with omental biopsy;  Surgeon: Leafy Ro, MD;  Location: [facility] ORS;  Service: General;;   TONSILLECTOMY      SOCIAL HISTORY:   Social History   Tobacco Use   Smoking status: Never   Smokeless tobacco: Never  Substance Use Topics   Alcohol use: No    FAMILY HISTORY:   Family History  Problem Relation Age of Onset   Cancer Father    Heart attack Father    Cancer Brother    Diabetes Brother    Diabetes Son    Breast cancer Neg Hx     DRUG ALLERGIES:   Allergies  Allergen Reactions   Chlorhexidine Itching   Ramipril Other (See Comments)    IRREGULAR HEART BEAT   Penicillins Rash    Has patient had a PCN reaction causing immediate rash, facial/tongue/throat swelling, SOB or lightheadedness with hypotension: Yes Has patient had a PCN reaction causing severe rash involving mucus membranes or skin necrosis: Yes Has patient had a PCN reaction that required hospitalization No Has patient had a PCN reaction occurring within the last 10 years: No If all of the above answers are "NO", then may proceed with Cephalosporin use.    Poison Oak Extract Rash   Sulfa Antibiotics Rash    REVIEW OF SYSTEMS:   ROS As per history of present illness. All pertinent systems were reviewed above. Constitutional, HEENT, cardiovascular, respiratory, GI, GU, musculoskeletal, neuro, psychiatric, endocrine, integumentary and hematologic systems were reviewed and are otherwise negative/unremarkable except for positive findings mentioned above in the HPI.   MEDICATIONS AT HOME:   Prior to Admission medications   Medication Sig Start Date End Date Taking? Authorizing Provider  acetaminophen (TYLENOL) 500 MG tablet  Take 500 mg by mouth every 6 (six) hours as needed for mild pain.    [provider]  apixaban (ELIQUIS) 5 MG TABS tablet Take 1 tablet (5 mg total) by mouth 2 (two) times daily. 03/26/22   Corky Downs, MD  carboxymethylcellulose (REFRESH PLUS) 0.5 % SOLN 1 drop 3 (three) times daily as needed.    [provider]  diltiazem (CARDIZEM CD) 240 MG 24 hr capsule Take 1 capsule (240 mg total) by mouth daily. 03/26/22   Corky Downs, MD  furosemide (LASIX) 40 MG tablet Take 1 tablet (40 mg total) by mouth daily. 04/15/22   Delma Freeze, FNP  glimepiride (AMARYL) 4 MG tablet TAKE 1 TABLET(4 MG) BY MOUTH DAILY BEFORE BREAKFAST 05/27/22   Corky Downs, MD  glucose blood (ONETOUCH ULTRA) test strip USE AS DIRECTED  04/21/22   Corky Downs, MD  losartan (COZAAR) 25 MG tablet TAKE 1 TABLET(25 MG) BY MOUTH DAILY Patient not taking: Reported on 02/12/2023 09/19/22   Delma Freeze, FNP  metoprolol succinate (TOPROL-XL) 100 MG 24 hr tablet TAKE 1 TABLET(100 MG) BY MOUTH DAILY WITH OR IMMEDIATELY FOLLOWING A MEAL 02/10/23   Delma Freeze, FNP  Multiple Vitamins-Minerals (PRESERVISION AREDS 2 PO) Take by mouth.    [provider]  pantoprazole (PROTONIX) 40 MG tablet TAKE 1 TABLET(40 MG) BY MOUTH DAILY 03/26/22   Corky Downs, MD  potassium chloride SA (KLOR-CON M) 20 MEQ tablet TAKE 1 TABLET(20 MEQ) BY MOUTH DAILY Patient not taking: Reported on 02/12/2023 11/25/22   Delma Freeze, FNP  simvastatin (ZOCOR) 40 MG tablet Take 1 tablet (40 mg total) by mouth daily. 03/26/22   Corky Downs, MD      VITAL SIGNS:  Blood pressure 108/72, pulse (!) 103, temperature 98.5 F (36.9 C), resp. rate (!) 28, height 5\' 9"  (1.753 m), weight 109.3 kg, SpO2 92%.  PHYSICAL EXAMINATION:  Physical Exam  GENERAL:  89 y.o.-year-old female patient lying in the bed with no acute distress.  EYES: Pupils equal, round, reactive to light and accommodation. No scleral icterus. Extraocular muscles intact.   HEENT: Head atraumatic, normocephalic. Oropharynx and nasopharynx clear.  NECK:  Supple, no jugular venous distention. No thyroid enlargement, no tenderness.  LUNGS: Mildly diminished bibasal breath sounds with no wheezing, rales,rhonchi or crepitation. No use of accessory muscles of respiration.  CARDIOVASCULAR: Regular rate and rhythm, S1, S2 normal. No murmurs, rubs, or gallops.  ABDOMEN: Soft, nondistended, nontender. Bowel sounds present. No organomegaly or mass.  EXTREMITIES: No pedal edema, cyanosis, or clubbing.  NEUROLOGIC: Cranial nerves II through XII are intact. Muscle strength 5/5 in all extremities. Sensation intact. Gait not checked.  PSYCHIATRIC: The patient is alert and oriented x 3.  Normal affect and good eye contact. SKIN: No obvious rash, lesion, or ulcer.   LABORATORY PANEL:   CBC Recent Labs  Lab 03/25/23 1849  WBC 11.7*  HGB 13.3  HCT 39.4  PLT 151   ------------------------------------------------------------------------------------------------------------------  Chemistries  Recent Labs  Lab 03/25/23 1814  NA 139  K 4.8  CL 102  CO2 26  GLUCOSE 134*  BUN 26*  CREATININE 0.94  CALCIUM 8.5*  AST 56*  ALT 38  ALKPHOS 132*  BILITOT 1.6*   ------------------------------------------------------------------------------------------------------------------  Cardiac Enzymes No results for input(s): "TROPONINI" in the last 168 hours. ------------------------------------------------------------------------------------------------------------------  RADIOLOGY:  CT Angio Chest PE W and/or Wo Contrast  Result Date: 03/25/2023 CLINICAL DATA:  Acute onset short of breath with hypoxia EXAM: CT ANGIOGRAPHY CHEST WITH CONTRAST TECHNIQUE: Multidetector CT imaging of the chest was performed using the standard protocol during bolus administration of intravenous contrast. Multiplanar CT image reconstructions and MIPs were obtained to evaluate the vascular  anatomy. RADIATION DOSE REDUCTION: This exam was performed according to the departmental dose-optimization program which includes automated exposure control, adjustment of the mA and/or kV according to patient size and/or use of iterative reconstruction technique. CONTRAST:  OMNIPAQUE IOHEXOL 350 MG/ML SOLN COMPARISON:  Chest x-ray 03/25/2023 FINDINGS: Cardiovascular: Satisfactory opacification of the pulmonary arteries to the segmental level. No evidence of pulmonary embolism. Mild aortic atherosclerosis. No aneurysm. Coronary vascular calcification. Mild cardiomegaly. No pericardial effusion Mediastinum/Nodes: Midline trachea. No thyroid mass. No suspicious lymph nodes. Esophagus within normal limits. Lungs/Pleura: No acute airspace disease, pleural effusion, or pneumothorax. Mild atelectasis or scarring at the right base Upper  Abdomen: No acute finding. Musculoskeletal: No acute osseous abnormality. Review of the MIP images confirms the above findings. IMPRESSION: 1. Negative for acute pulmonary embolus or aortic dissection. 2. Mild cardiomegaly. 3. Aortic atherosclerosis. Aortic Atherosclerosis (ICD10-I70.0). Electronically Signed   By: Jasmine Pang M.D.   On: 03/25/2023 21:58   DG Chest Port 1 View  Result Date: 03/25/2023 CLINICAL DATA:  Sepsis.  Fever.  Urinary tract infection. EXAM: PORTABLE CHEST 1 VIEW COMPARISON:  03/08/2022 FINDINGS: Cardiac enlargement. No vascular congestion, edema, or consolidation representing improvement since prior study. No pleural effusions. No pneumothorax. Calcification of the aorta. Degenerative changes in the spine. IMPRESSION: Cardiac enlargement. Lungs are clear demonstrating improvement since prior study. Electronically Signed   By: Burman Nieves M.D.   On: 03/25/2023 20:07      IMPRESSION AND PLAN:  Assessment and Plan: * Sepsis due to gram-negative UTI [facility]) - The patient be admitted to a medical telemetry bed. - Continue antibiotic therapy with IV  Rocephin. - We will follow blood and urine cultures. - Her sepsis manifested by tachycardia and tachypnea. - We are holding off hydration given her elevated BNP and history of grade 1 diastolic function. - I do not believe that the patient is in severe sepsis.  Hypoxia is due to mild acute CHF as mentioned below.  Acute respiratory failure with hypoxia (HCC) - She was ruled out PE.  She does not have any evidence for pneumonia on her chest x-ray or chest CTA. - This could be related to mild acute on chronic diastolic CHF given elevated BNP. - She was given 40 mg of IV Lasix and will be continued on daily. - O2 requirement is resolving. - We will continue Cozaar, Aldactone and Toprol-XL.  Atrial fibrillation, chronic (HCC) - We will continue Toprol-XL, cannot MCD and a Eliquis  Dyslipidemia - Continue statin therapy.  GERD without esophagitis - We will continue PPI therapy.    DVT prophylaxis: Eliquis Advanced Care Planning:  Code Status: full code.  When I discussed with her she mentioned that she wants to think about it.  She was emotional about her brother dying yesterday and her son having recent surgery. Family Communication:  The plan of care was discussed in details with the patient (and family). I answered all questions. The patient agreed to proceed with the above mentioned plan. Further management will depend upon hospital course. Disposition Plan: Back to previous home environment Consults called: none. All the records are reviewed and case discussed with ED provider.  Status is: Inpatient   At the time of the admission, it appears that the appropriate admission status for this patient is inpatient.  This is judged to be reasonable and necessary in order to provide the required intensity of service to ensure the patient's safety given the presenting symptoms, physical exam findings and initial radiographic and laboratory data in the context of comorbid conditions.  The  patient requires inpatient status due to high intensity of service, high risk of further deterioration and high frequency of surveillance required.  I certify that at the time of admission, it is my clinical judgment that the patient will require inpatient hospital care extending more than 2 midnights.                            Dispo: The patient is from: Home              Anticipated d/c is to: Home  Patient currently is not medically stable to d/c.              Difficult to place patient: No  Hannah Beat M.D on 03/26/2023 at 4:58 AM  [facility]   From 7 PM-7 AM, contact night-coverage [email or web address]  CC: Primary care physician; Dorothey Baseman, MD

## 2023-03-26 DIAGNOSIS — K219 Gastro-esophageal reflux disease without esophagitis: Secondary | ICD-10-CM | POA: Insufficient documentation

## 2023-03-26 DIAGNOSIS — I482 Chronic atrial fibrillation, unspecified: Secondary | ICD-10-CM

## 2023-03-26 DIAGNOSIS — A419 Sepsis, unspecified organism: Secondary | ICD-10-CM | POA: Diagnosis present

## 2023-03-26 DIAGNOSIS — N390 Urinary tract infection, site not specified: Secondary | ICD-10-CM | POA: Diagnosis not present

## 2023-03-26 DIAGNOSIS — A4150 Gram-negative sepsis, unspecified: Secondary | ICD-10-CM | POA: Diagnosis not present

## 2023-03-26 LAB — CBC
HCT: 38.5 % (ref 36.0–46.0)
Hemoglobin: 12.8 g/dL (ref 12.0–15.0)
MCH: 30.2 pg (ref 26.0–34.0)
MCHC: 33.2 g/dL (ref 30.0–36.0)
MCV: 90.8 fL (ref 80.0–100.0)
Platelets: 133 10*3/uL — ABNORMAL LOW (ref 150–400)
RBC: 4.24 MIL/uL (ref 3.87–5.11)
RDW: 13.8 % (ref 11.5–15.5)
WBC: 12.9 10*3/uL — ABNORMAL HIGH (ref 4.0–10.5)
nRBC: 0 % (ref 0.0–0.2)

## 2023-03-26 LAB — BASIC METABOLIC PANEL
Anion gap: 9 (ref 5–15)
BUN: 20 mg/dL (ref 8–23)
CO2: 27 mmol/L (ref 22–32)
Calcium: 8.3 mg/dL — ABNORMAL LOW (ref 8.9–10.3)
Chloride: 102 mmol/L (ref 98–111)
Creatinine, Ser: 1.01 mg/dL — ABNORMAL HIGH (ref 0.44–1.00)
GFR, Estimated: 53 mL/min — ABNORMAL LOW (ref >60)
Glucose, Bld: 149 mg/dL — ABNORMAL HIGH (ref 70–99)
Potassium: 3.4 mmol/L — ABNORMAL LOW (ref 3.5–5.1)
Sodium: 138 mmol/L (ref 135–145)

## 2023-03-26 LAB — CORTISOL-AM, BLOOD: Cortisol - AM: 22.1 ug/dL (ref 6.7–22.6)

## 2023-03-26 LAB — PROTIME-INR
INR: 1.4 — ABNORMAL HIGH (ref 0.8–1.2)
Prothrombin Time: 17.7 s — ABNORMAL HIGH (ref 11.4–15.2)

## 2023-03-26 LAB — PROCALCITONIN: Procalcitonin: 1.17 ng/mL

## 2023-03-26 NOTE — Evaluation (Signed)
Physical Therapy Evaluation Patient Details Name: Victoria Dalton MRN: [id number] DOB: [birth year] Today's Date: 03/26/2023  History of Present Illness  Pt is a 89 y.o. female presenting to hospital 03/25/23 with c/o fever, UTI, weakness, and fatigue; also SOB and difficulty with ambulation.  Pt admitted with sepsis d/t gram-negative UTI, acute respiratory failure with hypoxia.  PMH includes CHF, a-fib, htn, macular degeneration.  Per chart pt with chronic R hip pain.  Clinical Impression  Prior to recent medical concerns, pt was modified independent ambulating household distances with 4ww; no recent falls reported; lives alone in 1 level home with ramp to enter; daughter checks in on pt every day.  No c/o pain during session.  Currently pt is SBA semi-supine to sitting EOB; max assist to stand from bed (pt reporting surface not firm enough) but CGA to stand from recliner; and CGA to ambulate 30 feet with RW use (limited distance ambulating d/t SOB--SpO2 sats 93% or greater on room air during session).  Pt would currently benefit from skilled PT to address noted impairments and functional limitations (see below for any additional details).  Upon hospital discharge, pt would benefit from ongoing therapy.     If plan is discharge home, recommend the following: A little help with walking and/or transfers;A little help with bathing/dressing/bathroom;Assistance with cooking/housework;Assist for transportation;Help with stairs or ramp for entrance   Can travel by private vehicle        Equipment Recommendations  (pt has rollator at home already)  Recommendations for Other Services       Functional Status Assessment Patient has had a recent decline in their functional status and demonstrates the ability to make significant improvements in function in a reasonable and predictable amount of time.     Precautions / Restrictions Precautions Precautions: Fall Restrictions Weight Bearing  Restrictions: No      Mobility  Bed Mobility Overal bed mobility: Needs Assistance Bed Mobility: Supine to Sit     Supine to sit: Supervision, HOB elevated     General bed mobility comments: mild increased effort to perform on own    Transfers Overall transfer level: Needs assistance Equipment used: Rolling walker (2 wheels) Transfers: Sit to/from Stand Sit to Stand: Max assist, Contact guard assist           General transfer comment: max assist to stand from bed but CGA to stand from recliner    Ambulation/Gait Ambulation/Gait assistance: Contact guard assist Gait Distance (Feet): 30 Feet Assistive device: Rolling walker (2 wheels) Gait Pattern/deviations: Step-through pattern, Decreased step length - right, Decreased step length - left Gait velocity: decreased     General Gait Details: forward flexed posture; steady with RW use; limited d/t SOB  Stairs            Wheelchair Mobility     Tilt Bed    Modified Rankin (Stroke Patients Only)       Balance Overall balance assessment: Needs assistance Sitting-balance support: No upper extremity supported, Feet supported Sitting balance-Leahy Scale: Good Sitting balance - Comments: steady reaching within BOS   Standing balance support: Bilateral upper extremity supported, During functional activity, Reliant on assistive device for balance Standing balance-Leahy Scale: Good Standing balance comment: steady ambulating with RW use                             Pertinent Vitals/Pain Pain Assessment Pain Assessment: No/denies pain HR WFL during sessions activities.  Home Living Family/patient expects to be discharged to:: Private residence Living Arrangements: Alone Available Help at Discharge: Family;Available PRN/intermittently Type of Home: House Home Access: Ramped entrance       Home Layout: One level Home Equipment: Rollator (4 wheels);Grab bars - tub/shower;Shower seat;Grab  bars - toilet (standing/cardiac walker) Additional Comments: [employer]; daughter checks in on pt every day    Prior Function Prior Level of Function : Independent/Modified Independent             Mobility Comments: Modified independent ambulating with rollator household distances; denies any recent falls ADLs Comments: Daughter provides transportation and gets groceries/does errands     Extremity/Trunk Assessment   Upper Extremity Assessment Upper Extremity Assessment: Overall WFL for tasks assessed    Lower Extremity Assessment Lower Extremity Assessment: Generalized weakness    Cervical / Trunk Assessment Cervical / Trunk Assessment: Other exceptions Cervical / Trunk Exceptions: forward head/shoulders  Communication   Communication Communication: Hearing impairment (HOH) Cueing Techniques: Verbal cues;Visual cues  Cognition Arousal: Alert Behavior During Therapy: WFL for tasks assessed/performed Overall Cognitive Status: Within Functional Limits for tasks assessed                                          General Comments  Nursing cleared pt for participation in physical therapy.  Pt agreeable to PT session.  Pt's daughter present during session.    Exercises     Assessment/Plan    PT Assessment Patient needs continued PT services  PT Problem List Decreased strength;Decreased activity tolerance;Decreased balance;Decreased mobility;Cardiopulmonary status limiting activity       PT Treatment Interventions DME instruction;Gait training;Functional mobility training;Therapeutic activities;Therapeutic exercise;Balance training;Patient/family education    PT Goals (Current goals can be found in the Care Plan section)  Acute Rehab PT Goals Patient Stated Goal: to improve strength and mobility PT Goal Formulation: With patient Time For Goal Achievement: 04/09/23 Potential to Achieve Goals: Good    Frequency Min 1X/week     Co-evaluation                AM-PAC PT "6 Clicks" Mobility  Outcome Measure Help needed turning from your back to your side while in a flat bed without using bedrails?: None Help needed moving from lying on your back to sitting on the side of a flat bed without using bedrails?: A Little Help needed moving to and from a bed to a chair (including a wheelchair)?: A Little Help needed standing up from a chair using your arms (e.g., wheelchair or bedside chair)?: A Little Help needed to walk in hospital room?: A Little Help needed climbing 3-5 steps with a railing? : A Little 6 Click Score: 19    End of Session Equipment Utilized During Treatment: Gait belt Activity Tolerance: Patient limited by fatigue Patient left: in chair;with call bell/phone within reach;with chair alarm set;with family/visitor present Nurse Communication: Mobility status;Precautions PT Visit Diagnosis: Other abnormalities of gait and mobility (R26.89);Muscle weakness (generalized) (M62.81)    Time: 1308-6578 PT Time Calculation (min) (ACUTE ONLY): 30 min   Charges:   PT Evaluation $PT Eval Low Complexity: 1 Low PT Treatments $Therapeutic Activity: 8-22 mins PT General Charges $$ ACUTE PT VISIT: 1 Visit        Hendricks Limes, PT 03/26/23, 4:21 PM

## 2023-03-26 NOTE — Assessment & Plan Note (Addendum)
-   She was ruled out PE.  She does not have any evidence for pneumonia on her chest x-ray or chest CTA. - This could be related to mild acute on chronic diastolic CHF given elevated BNP. - She was given 40 mg of IV Lasix and will be continued on daily. - O2 requirement is resolving. - We will continue Cozaar, Aldactone and Toprol-XL.

## 2023-03-26 NOTE — Assessment & Plan Note (Signed)
Continue statin therapy.

## 2023-03-26 NOTE — Hospital Course (Addendum)
HPI: Victoria Dalton is a 89 y.o. female with medical history significant for CHF, atrial fibrillation, hypertension, dyslipidemia, and macular degeneration, who presented to the emergency room with acute onset of tactile fever and chills for which she took Tylenol at 8 AM and 4 PM right before arrival of EMS they checked her temperature at the time and it was 102.  She has been having increased fatigue and dyspnea on exertion.  She was found to be hypoxic by EMS with pulse oximetry and the mid 80s here in the ER that came up with 2 L of O2 by nasal cannula to the mid 90s.  She admits to dysuria and urinary frequency and urgency as well as nausea without vomiting. Recent tx for UTI.   Hospital course / significant events:  10/30: to ED. Elevated HR< RR, temp, and resp fail requiring [place] O2. Admitted to hospitalist service for sepsis d/t UTI and resp fail d/t mild acute CHF  10/31: cultures pending, continue IV abx. PT/OT to see.   Consultants:  none  Procedures/Surgeries: none      ASSESSMENT & PLAN:   Sepsis due to gram-negative UTI  Ruled out severe sepsis.   Hypoxia is due to mild acute CHF as mentioned below. IV Rocephin. follow blood and urine cultures. holding off hydration given her elevated BNP and history of grade 1 diastolic function.   Acute respiratory failure with hypoxia d/t mild exacerbation HFpEF ruled out PE, PNA   given 40 mg of IV Lasix  Continue diuresis Strict I&O continue Cozaar, Aldactone and Toprol-XL.   Atrial fibrillation, chronic (HCC) continue Toprol-XL, Eliquis   Dyslipidemia Continue statin .   GERD without esophagitis continue PPI     obese based on BMI: Body mass index is 35.59 kg/m.  Underweight - under 18.5  normal weight - 18.5 to 24.9 overweight - 25 to 29.9 obese - 30 or more   DVT prophylaxis: on Eliquis  IV fluids: no continuous IV fluids  Nutrition: cardiac diet  Central lines / invasive devices: none  Code Status: FULL  CODE ACP documentation reviewed: 03/26/23 and none on file in [facility]  TOC needs: TBD Barriers to dispo / significant pending items: culture results, anticipate d/c in 1-2 days

## 2023-03-26 NOTE — Progress Notes (Signed)
PROGRESS NOTE    Victoria Dalton   ZOX:[id number] DOB: [birth year]  DOA: 03/25/2023 Date of Service: 03/26/23 which is hospital day 1  PCP: Dorothey Baseman, MD    HPI: Victoria Dalton is a 89 y.o. female with medical history significant for CHF, atrial fibrillation, hypertension, dyslipidemia, and macular degeneration, who presented to the emergency room with acute onset of tactile fever and chills for which she took Tylenol at 8 AM and 4 PM right before arrival of EMS they checked her temperature at the time and it was 102.  She has been having increased fatigue and dyspnea on exertion.  She was found to be hypoxic by EMS with pulse oximetry and the mid 80s here in the ER that came up with 2 L of O2 by nasal cannula to the mid 90s.  She admits to dysuria and urinary frequency and urgency as well as nausea without vomiting. Recent tx for UTI.   Hospital course / significant events:  10/30: to ED. Elevated HR< RR, temp, and resp fail requiring [place] O2. Admitted to hospitalist service for sepsis d/t UTI and resp fail d/t mild acute CHF  10/31: cultures pending, continue IV abx. PT/OT to see.   Consultants:  none  Procedures/Surgeries: none      ASSESSMENT & PLAN:   Sepsis due to gram-negative UTI  Ruled out severe sepsis.   Hypoxia is due to mild acute CHF as mentioned below. IV Rocephin. follow blood and urine cultures. holding off hydration given her elevated BNP and history of grade 1 diastolic function.   Acute respiratory failure with hypoxia d/t mild exacerbation HFpEF ruled out PE, PNA   given 40 mg of IV Lasix  Continue diuresis Strict I&O continue Cozaar, Aldactone and Toprol-XL.   Atrial fibrillation, chronic (HCC) continue Toprol-XL, Eliquis   Dyslipidemia Continue statin .   GERD without esophagitis continue PPI     obese based on BMI: Body mass index is 35.59 kg/m.  Underweight - under 18.5  normal weight - 18.5 to 24.9 overweight - 25 to  29.9 obese - 30 or more   DVT prophylaxis: on Eliquis  IV fluids: no continuous IV fluids  Nutrition: cardiac diet  Central lines / invasive devices: none  Code Status: FULL CODE ACP documentation reviewed: 03/26/23 and none on file in [facility]  TOC needs: TBD Barriers to dispo / significant pending items: culture results, anticipate d/c in 1-2 days              Subjective / Brief ROS:  Patient reports feeling much better today No fever/chills Reports still dysuria Reports sounds liek hx vaginal atrophy was seeing OBGYN and getting some sort of suppository/cream which she had not been taking recently  Denies CP/SOB.  Pain controlled.  Denies new weakness.  Tolerating diet.  Reports no concerns w/ urination/defecation.   Family Communication: none at this time     Objective Findings:  Vitals:   03/26/23 1130 03/26/23 1200 03/26/23 1300 03/26/23 1421  BP: 117/72 113/66 131/71 119/63  Pulse: (!) 103 91 85 64  Resp: (!) 25 19 (!) 23 16  Temp:    98.1 F (36.7 C)  TempSrc:      SpO2: 94% 94% 97% 94%  Weight:      Height:        Intake/Output Summary (Last 24 hours) at 03/26/2023 1447 Last data filed at 03/26/2023 0355 Gross per 24 hour  Intake 1000 ml  Output 1000 ml  Net 0  ml   Filed Weights   03/25/23 1808  Weight: 109.3 kg    Examination:  Physical Exam Constitutional:      General: She is not in acute distress.    Appearance: She is obese. She is not ill-appearing.  Cardiovascular:     Rate and Rhythm: Normal rate and regular rhythm.  Pulmonary:     Effort: Pulmonary effort is normal.     Breath sounds: Normal breath sounds.  Abdominal:     General: Abdomen is flat.     Palpations: Abdomen is soft.  Musculoskeletal:     Right lower leg: No edema.     Left lower leg: No edema.  Neurological:     General: No focal deficit present.     Mental Status: She is alert and oriented to person, place, and time. Mental status is at baseline.   Psychiatric:        Mood and Affect: Mood normal.        Behavior: Behavior normal.          Scheduled Medications:   apixaban  5 mg Oral BID   diltiazem  240 mg Oral Daily   glimepiride  4 mg Oral Q breakfast   metoprolol succinate  100 mg Oral Daily   pantoprazole  40 mg Oral Daily    Continuous Infusions:  cefTRIAXone (ROCEPHIN)  IV Stopped (03/26/23 0140)    PRN Medications:  acetaminophen **OR** acetaminophen, magnesium hydroxide, ondansetron **OR** ondansetron (ZOFRAN) IV, polyvinyl alcohol, traZODone  Antimicrobials from admission:  Anti-infectives (From admission, onward)    Start     Dose/Rate Route Frequency Ordered Stop   03/25/23 2345  cefTRIAXone (ROCEPHIN) 2 g in sodium chloride 0.9 % 100 mL IVPB        2 g 200 mL/hr over 30 Minutes Intravenous Every 24 hours 03/25/23 2338 04/01/23 2344   03/25/23 2215  cefTRIAXone (ROCEPHIN) 2 g in sodium chloride 0.9 % 100 mL IVPB  Status:  Discontinued        2 g 200 mL/hr over 30 Minutes Intravenous  Once 03/25/23 2202 03/25/23 2348   03/25/23 2215  azithromycin (ZITHROMAX) 500 mg in sodium chloride 0.9 % 250 mL IVPB        500 mg 250 mL/hr over 60 Minutes Intravenous  Once 03/25/23 2202 03/26/23 0108           Data Reviewed:  I have personally reviewed the following...  CBC: Recent Labs  Lab 03/25/23 1849 03/26/23 0509  WBC 11.7* 12.9*  NEUTROABS 10.4*  --   HGB 13.3 12.8  HCT 39.4 38.5  MCV 89.7 90.8  PLT 151 133*   Basic Metabolic Panel: Recent Labs  Lab 03/25/23 1814 03/26/23 0509  NA 139 138  K 4.8 3.4*  CL 102 102  CO2 26 27  GLUCOSE 134* 149*  BUN 26* 20  CREATININE 0.94 1.01*  CALCIUM 8.5* 8.3*   GFR: Estimated Creatinine Clearance: 48.7 mL/min (A) (by C-G formula based on SCr of 1.01 mg/dL (H)). Liver Function Tests: Recent Labs  Lab 03/25/23 1814  AST 56*  ALT 38  ALKPHOS 132*  BILITOT 1.6*  PROT 6.9  ALBUMIN 4.0   No results for input(s): "LIPASE", "AMYLASE" in the  last 168 hours. No results for input(s): "AMMONIA" in the last 168 hours. Coagulation Profile: Recent Labs  Lab 03/25/23 1814 03/26/23 0509  INR 1.3* 1.4*   Cardiac Enzymes: No results for input(s): "CKTOTAL", "CKMB", "CKMBINDEX", "TROPONINI" in the last 168 hours.  BNP (last 3 results) No results for input(s): "PROBNP" in the last 8760 hours. HbA1C: No results for input(s): "HGBA1C" in the last 72 hours. CBG: Recent Labs  Lab 03/25/23 2319  GLUCAP 161*   Lipid Profile: No results for input(s): "CHOL", "HDL", "LDLCALC", "TRIG", "CHOLHDL", "LDLDIRECT" in the last 72 hours. Thyroid Function Tests: No results for input(s): "TSH", "T4TOTAL", "FREET4", "T3FREE", "THYROIDAB" in the last 72 hours. Anemia Panel: No results for input(s): "VITAMINB12", "FOLATE", "FERRITIN", "TIBC", "IRON", "RETICCTPCT" in the last 72 hours. Most Recent Urinalysis On File:     Component Value Date/Time   COLORURINE YELLOW (A) 03/25/2023 2208   APPEARANCEUR HAZY (A) 03/25/2023 2208   APPEARANCEUR Clear 11/18/2019 1359   LABSPEC 1.040 (H) 03/25/2023 2208   LABSPEC 1.011 08/30/2013 0053   PHURINE 5.0 03/25/2023 2208   GLUCOSEU NEGATIVE 03/25/2023 2208   GLUCOSEU see comment 08/30/2013 0053   HGBUR SMALL (A) 03/25/2023 2208   BILIRUBINUR NEGATIVE 03/25/2023 2208   BILIRUBINUR negative 02/07/2021 1828   BILIRUBINUR Negative 11/18/2019 1359   BILIRUBINUR see comment 08/30/2013 0053   KETONESUR NEGATIVE 03/25/2023 2208   PROTEINUR NEGATIVE 03/25/2023 2208   UROBILINOGEN 0.2 02/07/2021 1828   NITRITE POSITIVE (A) 03/25/2023 2208   LEUKOCYTESUR MODERATE (A) 03/25/2023 2208   LEUKOCYTESUR see comment 08/30/2013 0053   Sepsis Labs: @LABRCNTIP (procalcitonin:4,lacticidven:4) Microbiology: Recent Results (from the past 240 hour(s))  Culture, blood (Routine x 2)     Status: None (Preliminary result)   Collection Time: 03/25/23  6:14 PM   Specimen: BLOOD  Result Value Ref Range Status   Specimen  Description BLOOD BLOOD LEFT FOREARM  Final   Special Requests   Final    BOTTLES DRAWN AEROBIC AND ANAEROBIC Blood Culture results may not be optimal due to an inadequate volume of blood received in culture bottles   Culture   Final    NO GROWTH < 12 HOURS Performed at [facility], [address], [place], [place] [address]    Report Status PENDING  Incomplete  Resp panel by RT-PCR (RSV, Flu A&B, Covid) Anterior Nasal Swab     Status: None   Collection Time: 03/25/23  6:49 PM   Specimen: Anterior Nasal Swab  Result Value Ref Range Status   SARS Coronavirus 2 by RT PCR NEGATIVE NEGATIVE Final    Comment: (NOTE) SARS-CoV-2 target nucleic acids are NOT DETECTED.  The SARS-CoV-2 RNA is generally detectable in upper respiratory specimens during the acute phase of infection. The lowest concentration of SARS-CoV-2 viral copies this assay can detect is 138 copies/mL. A negative result does not preclude SARS-Cov-2 infection and should not be used as the sole basis for treatment or other patient management decisions. A negative result may occur with  improper specimen collection/handling, submission of specimen other than nasopharyngeal swab, presence of viral mutation(s) within the areas targeted by this assay, and inadequate number of viral copies(<138 copies/mL). A negative result must be combined with clinical observations, patient history, and epidemiological information. The expected result is Negative.  Fact Sheet for Patients:  BloggerCourse.com  Fact Sheet for Healthcare Providers:  SeriousBroker.it  This test is no t yet approved or cleared by the [place] FDA and  has been authorized for detection and/or diagnosis of SARS-CoV-2 by FDA under an Emergency Use Authorization (EUA). This EUA will remain  in effect (meaning this test can be used) for the duration of the COVID-19 declaration under Section 564(b)(1) of  the Act, 21 U.S.C.section 360bbb-3(b)(1), unless the authorization is terminated  or revoked sooner.       Influenza A by PCR NEGATIVE NEGATIVE Final   Influenza B by PCR NEGATIVE NEGATIVE Final    Comment: (NOTE) The Xpert Xpress SARS-CoV-2/FLU/RSV plus assay is intended as an aid in the diagnosis of influenza from Nasopharyngeal swab specimens and should not be used as a sole basis for treatment. Nasal washings and aspirates are unacceptable for Xpert Xpress SARS-CoV-2/FLU/RSV testing.  Fact Sheet for Patients: BloggerCourse.com  Fact Sheet for Healthcare Providers: SeriousBroker.it  This test is not yet approved or cleared by the [place] FDA and has been authorized for detection and/or diagnosis of SARS-CoV-2 by FDA under an Emergency Use Authorization (EUA). This EUA will remain in effect (meaning this test can be used) for the duration of the COVID-19 declaration under Section 564(b)(1) of the Act, 21 U.S.C. section 360bbb-3(b)(1), unless the authorization is terminated or revoked.     Resp Syncytial Virus by PCR NEGATIVE NEGATIVE Final    Comment: (NOTE) Fact Sheet for Patients: BloggerCourse.com  Fact Sheet for Healthcare Providers: SeriousBroker.it  This test is not yet approved or cleared by the [place] FDA and has been authorized for detection and/or diagnosis of SARS-CoV-2 by FDA under an Emergency Use Authorization (EUA). This EUA will remain in effect (meaning this test can be used) for the duration of the COVID-19 declaration under Section 564(b)(1) of the Act, 21 U.S.C. section 360bbb-3(b)(1), unless the authorization is terminated or revoked.  Performed at [facility], [address] [place], [place] [address]   Culture, blood (Routine x 2)     Status: None (Preliminary result)   Collection Time: 03/25/23 11:24 PM   Specimen:  BLOOD  Result Value Ref Range Status   Specimen Description BLOOD BLOOD RIGHT HAND  Final   Special Requests   Final    BOTTLES DRAWN AEROBIC AND ANAEROBIC Blood Culture adequate volume   Culture   Final    NO GROWTH < 12 HOURS Performed at [facility], [address] [place], [place] [address]    Report Status PENDING  Incomplete      Radiology Studies last 3 days: CT Angio Chest PE W and/or Wo Contrast  Result Date: 03/25/2023 CLINICAL DATA:  Acute onset short of breath with hypoxia EXAM: CT ANGIOGRAPHY CHEST WITH CONTRAST TECHNIQUE: Multidetector CT imaging of the chest was performed using the standard protocol during bolus administration of intravenous contrast. Multiplanar CT image reconstructions and MIPs were obtained to evaluate the vascular anatomy. RADIATION DOSE REDUCTION: This exam was performed according to the departmental dose-optimization program which includes automated exposure control, adjustment of the mA and/or kV according to patient size and/or use of iterative reconstruction technique. CONTRAST:  OMNIPAQUE IOHEXOL 350 MG/ML SOLN COMPARISON:  Chest x-ray 03/25/2023 FINDINGS: Cardiovascular: Satisfactory opacification of the pulmonary arteries to the segmental level. No evidence of pulmonary embolism. Mild aortic atherosclerosis. No aneurysm. Coronary vascular calcification. Mild cardiomegaly. No pericardial effusion Mediastinum/Nodes: Midline trachea. No thyroid mass. No suspicious lymph nodes. Esophagus within normal limits. Lungs/Pleura: No acute airspace disease, pleural effusion, or pneumothorax. Mild atelectasis or scarring at the right base Upper Abdomen: No acute finding. Musculoskeletal: No acute osseous abnormality. Review of the MIP images confirms the above findings. IMPRESSION: 1. Negative for acute pulmonary embolus or aortic dissection. 2. Mild cardiomegaly. 3. Aortic atherosclerosis. Aortic Atherosclerosis (ICD10-I70.0). Electronically Signed    By: Jasmine Pang M.D.   On: 03/25/2023 21:58   DG Chest Port 1 View  Result Date:  03/25/2023 CLINICAL DATA:  Sepsis.  Fever.  Urinary tract infection. EXAM: PORTABLE CHEST 1 VIEW COMPARISON:  03/08/2022 FINDINGS: Cardiac enlargement. No vascular congestion, edema, or consolidation representing improvement since prior study. No pleural effusions. No pneumothorax. Calcification of the aorta. Degenerative changes in the spine. IMPRESSION: Cardiac enlargement. Lungs are clear demonstrating improvement since prior study. Electronically Signed   By: Burman Nieves M.D.   On: 03/25/2023 20:07      Sunnie Nielsen, DO [facility] 03/26/2023, 2:47 PM    Dictation software may have been used to generate the above note. Typos may occur and escape review in typed/dictated notes. Please contact Dr Lyn Hollingshead directly for clarity if needed.  Staff may message me via secure chat in Epic  but this may not receive an immediate response,  please page me for urgent matters!  If 7PM-7AM, please contact night coverage [email or web address]

## 2023-03-26 NOTE — Assessment & Plan Note (Addendum)
-   The patient be admitted to a medical telemetry bed. - Continue antibiotic therapy with IV Rocephin. - We will follow blood and urine cultures. - Her sepsis manifested by tachycardia and tachypnea. - We are holding off hydration given her elevated BNP and history of grade 1 diastolic function. - I do not believe that the patient is in severe sepsis.  Hypoxia is due to mild acute CHF as mentioned below.

## 2023-03-26 NOTE — Assessment & Plan Note (Signed)
-   We will continue PPI therapy 

## 2023-03-26 NOTE — Assessment & Plan Note (Signed)
-   We will continue Toprol-XL, cannot MCD and a Eliquis

## 2023-03-27 DIAGNOSIS — A4150 Gram-negative sepsis, unspecified: Secondary | ICD-10-CM | POA: Diagnosis not present

## 2023-03-27 DIAGNOSIS — N390 Urinary tract infection, site not specified: Secondary | ICD-10-CM | POA: Diagnosis not present

## 2023-03-27 MED ORDER — METOPROLOL SUCCINATE ER 25 MG PO TB24: 12.5000 mg | ORAL_TABLET | Freq: Every day | ORAL | 0 refills | Status: DC

## 2023-03-27 MED ORDER — LOSARTAN POTASSIUM 25 MG PO TABS: 25.0000 mg | ORAL_TABLET | Freq: Every day | ORAL | Status: DC

## 2023-03-27 MED ORDER — FUROSEMIDE 40 MG PO TABS: 40.0000 mg | ORAL_TABLET | Freq: Every day | ORAL | Status: DC

## 2023-03-27 MED ORDER — CEFDINIR 300 MG PO CAPS: 300.0000 mg | ORAL_CAPSULE | Freq: Two times a day (BID) | ORAL | 0 refills | Status: AC

## 2023-03-27 MED ORDER — METOPROLOL SUCCINATE ER 25 MG PO TB24
12.5000 mg | ORAL_TABLET | Freq: Every day | ORAL | Status: DC
  Administered 2023-03-27: 12.5 mg via ORAL
  Filled 2023-03-27: qty 1

## 2023-03-27 NOTE — Evaluation (Signed)
Occupational Therapy Evaluation Patient Details Name: Victoria Dalton MRN: [id number] DOB: 1931/04/26 Today's Date: 03/27/2023   History of Present Illness Pt is a 89 y.o. female presenting to hospital 03/25/23 with c/o fever, UTI, weakness, and fatigue; also SOB and difficulty with ambulation.  Pt admitted with sepsis d/t gram-negative UTI, acute respiratory failure with hypoxia.  PMH includes CHF, a-fib, htn, macular degeneration.  Per chart pt with chronic R hip pain.   Clinical Impression   Pt was seen for OT evaluation this date. Prior to hospital admission, pt was living at home alone with daughter available to help with transportation and getting groceries, otherwise pt was MOD I/IND.   Pt presents to acute OT demonstrating impaired ADL performance and functional mobility 2/2 weakness, mild balance deficits, and decreased activity tolerance (See OT problem list for additional functional deficits). Pt reports no pain. Pt currently requires SUP for bed mobility and CGA for STS from EOB, mobility within the room using RW and toilet transfer. Pt able to perform toileting hygiene and clothing management with SUP/SBA seated on toilet.  She stood at sink to perform oral care and wash hands with BUE support on sink counter and no LOB requiring overall SUP from therapist. Left seated at EOB to take a basin bath with daughter. Pt would benefit from skilled OT services to address noted impairments and functional limitations (see below for any additional details) in order to maximize safety and independence while minimizing falls risk and caregiver burden. Do anticipate the need for follow up OT services upon acute hospital DC, recommend [facility] consult.       If plan is discharge home, recommend the following: A little help with walking and/or transfers;A little help with bathing/dressing/bathroom;Assistance with cooking/housework;Help with stairs or ramp for entrance;Assist for transportation     Functional Status Assessment  Patient has had a recent decline in their functional status and demonstrates the ability to make significant improvements in function in a reasonable and predictable amount of time.  Equipment Recommendations  BSC/3in1    Recommendations for Other Services       Precautions / Restrictions Restrictions Weight Bearing Restrictions: No      Mobility Bed Mobility Overal bed mobility: Needs Assistance Bed Mobility: Supine to Sit     Supine to sit: Supervision, HOB elevated          Transfers Overall transfer level: Needs assistance Equipment used: Rolling walker (2 wheels) Transfers: Sit to/from Stand Sit to Stand: Contact guard assist           General transfer comment: CGA for STS from EOB and toilet using grab bar      Balance Overall balance assessment: Needs assistance Sitting-balance support: No upper extremity supported, Feet supported Sitting balance-Leahy Scale: Good     Standing balance support: Bilateral upper extremity supported, During functional activity, Reliant on assistive device for balance Standing balance-Leahy Scale: Good Standing balance comment: good balance standing at sink for hygiene tasks with BUE support                           ADL either performed or assessed with clinical judgement   ADL Overall ADL's : Needs assistance/impaired     Grooming: Wash/dry hands;Oral care;Standing;Supervision/safety Grooming Details (indicate cue type and reason): BUE support on sink             Lower Body Dressing: Contact guard assist;Sit to/from stand;Sitting/lateral leans Lower Body Dressing Details (indicate cue  type and reason): to doff pull up Toilet Transfer: Contact guard assist;Grab bars;Rolling walker (2 wheels);Ambulation   Toileting- Clothing Manipulation and Hygiene: Sit to/from stand;Sitting/lateral lean;Supervision/safety       Functional mobility during ADLs: Rolling walker (2  wheels);Supervision/safety General ADL Comments: SBA for mobility with RW to the bathroom and back to EOB     Vision         Perception         Praxis         Pertinent Vitals/Pain Pain Assessment Pain Assessment: No/denies pain     Extremity/Trunk Assessment         Cervical / Trunk Assessment Cervical / Trunk Assessment: Other exceptions Cervical / Trunk Exceptions: forward head/shoulders   Communication Communication Communication: Hearing impairment (HOH)   Cognition Arousal: Alert Behavior During Therapy: WFL for tasks assessed/performed Overall Cognitive Status: Within Functional Limits for tasks assessed                                       General Comments       Exercises Other Exercises Other Exercises: Edu on role of OT and importance of activity to maintain strength and endurance. Other Exercises: Edu on getting up to [facility] or toilet for all toileting needs since this is what she will do upon return home.   Shoulder Instructions      Home Living Family/patient expects to be discharged to:: Private residence Living Arrangements: Alone Available Help at Discharge: Family;Available PRN/intermittently Type of Home: House Home Access: Ramped entrance     Home Layout: One level     Bathroom Shower/Tub: Chief Strategy Officer: Handicapped height     Home Equipment: Rollator (4 wheels);Grab bars - tub/shower;Shower seat;Grab bars - toilet (standing/cardiac walker)   Additional Comments: [employer]; daughter checks in on pt every day      Prior Functioning/Environment Prior Level of Function : Independent/Modified Independent             Mobility Comments: Modified independent ambulating with rollator household distances; denies any recent falls ADLs Comments: Daughter provides transportation and gets groceries/does errands        OT Problem List: Decreased strength;Impaired balance (sitting and/or  standing);Decreased activity tolerance      OT Treatment/Interventions: Self-care/ADL training;Therapeutic exercise;Patient/family education;Balance training;Energy conservation;Therapeutic activities;DME and/or AE instruction    OT Goals(Current goals can be found in the care plan section) Acute Rehab OT Goals Patient Stated Goal: return home OT Goal Formulation: With patient Time For Goal Achievement: 04/10/23 Potential to Achieve Goals: Good ADL Goals Pt Will Perform Lower Body Bathing: sitting/lateral leans;sit to/from stand;with supervision Pt Will Perform Lower Body Dressing: with supervision;sit to/from stand;sitting/lateral leans Pt Will Transfer to Toilet: with supervision;ambulating;regular height toilet;grab bars Pt Will Perform Toileting - Clothing Manipulation and hygiene: with supervision;sitting/lateral leans;sit to/from stand Additional ADL Goal #1: Pt will report use of compensatory, ECS and other safety strategies during ADL performance to promote improved safety and IND upon return home.  OT Frequency: Min 1X/week    Co-evaluation              AM-PAC OT "6 Clicks" Daily Activity     Outcome Measure Help from another person eating meals?: None Help from another person taking care of personal grooming?: None Help from another person toileting, which includes using toliet, bedpan, or urinal?: A Little Help from another person bathing (  including washing, rinsing, drying)?: A Little Help from another person to put on and taking off regular upper body clothing?: None Help from another person to put on and taking off regular lower body clothing?: A Little 6 Click Score: 21   End of Session Equipment Utilized During Treatment: Gait belt;Rolling walker (2 wheels) Nurse Communication: Mobility status  Activity Tolerance: Patient tolerated treatment well Patient left: in bed;with family/visitor present  OT Visit Diagnosis: Other abnormalities of gait and mobility  (R26.89)                Time: 1610-9604 OT Time Calculation (min): 29 min Charges:  OT General Charges $OT Visit: 1 Visit OT Evaluation $OT Eval Low Complexity: 1 Low OT Treatments $Self Care/Home Management : 8-22 mins  Dominik Lauricella, OTR/L 03/27/23, 11:36 AM  Victoria Dalton 03/27/2023, 11:34 AM

## 2023-03-27 NOTE — Plan of Care (Signed)
The patient has been discharged. IV has been removed. Education has been completed with the patient and daughter at the bedside. The patient has been wheeled down to her vehicle.  Problem: Fluid Volume: Goal: Hemodynamic stability will improve Outcome: Completed/Met   Problem: Clinical Measurements: Goal: Diagnostic test results will improve Outcome: Completed/Met Goal: Signs and symptoms of infection will decrease Outcome: Completed/Met   Problem: Respiratory: Goal: Ability to maintain adequate ventilation will improve Outcome: Completed/Met   Problem: Education: Goal: Knowledge of General Education information will improve Description: Including pain rating scale, medication(s)/side effects and non-pharmacologic comfort measures Outcome: Completed/Met   Problem: Health Behavior/Discharge Planning: Goal: Ability to manage health-related needs will improve Outcome: Completed/Met   Problem: Clinical Measurements: Goal: Ability to maintain clinical measurements within normal limits will improve Outcome: Completed/Met Goal: Will remain free from infection Outcome: Completed/Met Goal: Diagnostic test results will improve Outcome: Completed/Met Goal: Respiratory complications will improve Outcome: Completed/Met Goal: Cardiovascular complication will be avoided Outcome: Completed/Met   Problem: Activity: Goal: Risk for activity intolerance will decrease Outcome: Completed/Met   Problem: Nutrition: Goal: Adequate nutrition will be maintained Outcome: Completed/Met   Problem: Coping: Goal: Level of anxiety will decrease Outcome: Completed/Met   Problem: Elimination: Goal: Will not experience complications related to bowel motility Outcome: Completed/Met Goal: Will not experience complications related to urinary retention Outcome: Completed/Met   Problem: Pain Management: Goal: General experience of comfort will improve Outcome: Completed/Met   Problem: Safety: Goal:  Ability to remain free from injury will improve Outcome: Completed/Met   Problem: Skin Integrity: Goal: Risk for impaired skin integrity will decrease Outcome: Completed/Met   Problem: Acute Rehab PT Goals(only PT should resolve) Goal: Pt Will Go Supine/Side To Sit Outcome: Completed/Met Goal: Patient Will Transfer Sit To/From Stand Outcome: Completed/Met Goal: Pt Will Transfer Bed To Chair/Chair To Bed Outcome: Completed/Met Goal: Pt Will Ambulate Outcome: Completed/Met

## 2023-03-27 NOTE — Discharge Summary (Signed)
Physician Discharge Summary   Patient: Victoria Dalton MRN: [id number]  DOB: 1931-04-23   Admit:     Date of Admission: 03/25/2023 Admitted from: home   Discharge: Date of discharge: 03/27/23 Disposition: Home Condition at discharge: good  CODE STATUS: FULL CODE     Discharge Physician: Sunnie Nielsen, DO [facility]     PCP: Dorothey Baseman, MD  Recommendations for Outpatient Follow-up:  Follow up with PCP Dorothey Baseman, MD in 1-2 weeks: BP recheck Follow up with GYN in 1-2 weeks: vaginal bleeding / atrophic vaginitis     Discharge Instructions     Call MD for:  persistant dizziness or light-headedness   Complete by: As directed    Call MD for:  severe uncontrolled pain   Complete by: As directed    Call MD for:  temperature >100.4   Complete by: As directed    Diet Carb Modified   Complete by: As directed    Increase activity slowly   Complete by: As directed          Discharge Diagnoses: Principal Problem:   Sepsis due to gram-negative UTI (HCC) Active Problems:   Acute respiratory failure with hypoxia (HCC)   Atrial fibrillation, chronic (HCC)   Dyslipidemia   GERD without esophagitis   Sepsis secondary to UTI [facility])       HPI: Victoria Dalton is a 89 y.o. female with medical history significant for CHF, atrial fibrillation, hypertension, dyslipidemia, and macular degeneration, who presented to the emergency room with acute onset of tactile fever and chills for which she took Tylenol at 8 AM and 4 PM right before arrival of EMS they checked her temperature at the time and it was 102.  She has been having increased fatigue and dyspnea on exertion.  She was found to be hypoxic by EMS with pulse oximetry and the mid 80s here in the ER that came up with 2 L of O2 by nasal cannula to the mid 90s.  She admits to dysuria and urinary frequency and urgency as well as nausea without vomiting. Recent tx for UTI.   Hospital course /  significant events:  10/30: to ED. Elevated HR< RR, temp, and resp fail requiring [place] O2. Admitted to hospitalist service for sepsis d/t UTI and resp fail d/t mild acute CHF  10/31: cultures pending, continue IV abx. PT/OT to see.  11/01: UCx (+)Ecoli, pend susceptibilities but given improvement and pt has her brother's funeral to attend reasonable to discharge and will follow cultures   Consultants:  none  Procedures/Surgeries: none      ASSESSMENT & PLAN:   Sepsis due to gram-negative UTI Ecoli Ruled out severe sepsis.   Hypoxia is due to mild acute CHF as mentioned below. IV Rocephin --> po omnicef  follow urine cultures.   Acute respiratory failure with hypoxia d/t mild exacerbation HFpEF ruled out PE, PNA   given 40 mg of IV Lasix  continue Cozaar, Aldactone and Toprol-XL.   Atrial fibrillation, chronic (HCC) continue Toprol-XL, Eliquis   Dyslipidemia Continue statin .   GERD without esophagitis continue PPI     obese based on BMI: Body mass index is 35.59 kg/m.  Underweight - under 18.5  normal weight - 18.5 to 24.9 overweight - 25 to 29.9 obese - 30 or more              Discharge Instructions  Allergies as of 03/27/2023       Reactions  Chlorhexidine Itching   Ramipril Other (See Comments)   IRREGULAR HEART BEAT   Penicillins Rash   Has patient had a PCN reaction causing immediate rash, facial/tongue/throat swelling, SOB or lightheadedness with hypotension: Yes Has patient had a PCN reaction causing severe rash involving mucus membranes or skin necrosis: Yes Has patient had a PCN reaction that required hospitalization No Has patient had a PCN reaction occurring within the last 10 years: No If all of the above answers are "NO", then may proceed with Cephalosporin use.   Poison Oak Extract Rash   Sulfa Antibiotics Rash        Medication List     STOP taking these medications    potassium chloride SA 20 MEQ tablet Commonly known  as: KLOR-CON M       TAKE these medications    acetaminophen 500 MG tablet Commonly known as: TYLENOL Take 500 mg by mouth every 6 (six) hours as needed for mild pain.   apixaban 5 MG Tabs tablet Commonly known as: ELIQUIS Take 1 tablet (5 mg total) by mouth 2 (two) times daily.   carboxymethylcellulose 0.5 % Soln Commonly known as: REFRESH PLUS 1 drop 3 (three) times daily as needed.   cefdinir 300 MG capsule Commonly known as: OMNICEF Take 1 capsule (300 mg total) by mouth 2 (two) times daily for 5 days.   diltiazem 240 MG 24 hr capsule Commonly known as: CARDIZEM CD Take 1 capsule (240 mg total) by mouth daily.   furosemide 40 MG tablet Commonly known as: Lasix Take 1 tablet (40 mg total) by mouth daily. HOLD FOR NOW, TAKE HALF TABLET IF NEEDED FOR LEG SWELLING What changed: additional instructions   glimepiride 4 MG tablet Commonly known as: AMARYL TAKE 1 TABLET(4 MG) BY MOUTH DAILY BEFORE BREAKFAST   losartan 25 MG tablet Commonly known as: COZAAR Take 1 tablet (25 mg total) by mouth daily. HOLD FOR NOW, UNTIL FOLLOW UP WITH PRIMARY CARE What changed: See the new instructions.   metoprolol succinate 25 MG 24 hr tablet Commonly known as: TOPROL-XL Take 0.5 tablets (12.5 mg total) by mouth daily. Take with or immediately following a meal. LOWER DOSE - HOLD THE 100 MG DOSE UNTIL FOLLOW UP WITH PRIMARY CARE What changed:  medication strength See the new instructions.   OneTouch Ultra test strip Generic drug: glucose blood USE AS DIRECTED   pantoprazole 40 MG tablet Commonly known as: PROTONIX TAKE 1 TABLET(40 MG) BY MOUTH DAILY   PRESERVISION AREDS 2 PO Take by mouth.   simvastatin 40 MG tablet Commonly known as: ZOCOR Take 1 tablet (40 mg total) by mouth daily.         Follow-up Information     Dorothey Baseman, MD. Schedule an appointment as soon as possible for a visit.   Specialty: Family Medicine Why: hosptial follow up, monitor blood  pressure Contact information: [address] [place] [address] [phone]         Conard Novak, MD. Schedule an appointment as soon as possible for a visit.   Specialty: Obstetrics and Gynecology Why: follow up UTI / vaginal bleeding Contact information: [address] [place] [address] [phone]                 Allergies  Allergen Reactions   Chlorhexidine Itching   Ramipril Other (See Comments)    IRREGULAR HEART BEAT   Penicillins Rash    Has patient had a PCN reaction causing immediate rash, facial/tongue/throat  swelling, SOB or lightheadedness with hypotension: Yes Has patient had a PCN reaction causing severe rash involving mucus membranes or skin necrosis: Yes Has patient had a PCN reaction that required hospitalization No Has patient had a PCN reaction occurring within the last 10 years: No If all of the above answers are "NO", then may proceed with Cephalosporin use.    Poison Oak Extract Rash   Sulfa Antibiotics Rash     Subjective: pt reports she feels great, would like to be discharged, her brother's funeral is this afternoon    Discharge Exam: BP (!) 116/56 (BP Location: Right Arm)   Pulse (!) 52   Temp 99.2 F (37.3 C) (Oral)   Resp 18   Ht 5\' 9"  (1.753 m)   Wt 109.3 kg   SpO2 92%   BMI 35.59 kg/m  General: Pt is alert, awake, not in acute distress Cardiovascular: RRR, S1/S2 +, no rubs, no gallops Respiratory: CTA bilaterally, no wheezing, no rhonchi Abdominal: Soft, NT, ND, bowel sounds + Extremities: no edema, no cyanosis     The results of significant diagnostics from this hospitalization (including imaging, microbiology, ancillary and laboratory) are listed below for reference.     Microbiology: Recent Results (from the past 240 hour(s))  Culture, blood (Routine x 2)     Status: None (Preliminary result)   Collection Time: 03/25/23  6:14 PM   Specimen: BLOOD  Result Value Ref Range Status   Specimen  Description BLOOD BLOOD LEFT FOREARM  Final   Special Requests   Final    BOTTLES DRAWN AEROBIC AND ANAEROBIC Blood Culture results may not be optimal due to an inadequate volume of blood received in culture bottles   Culture   Final    NO GROWTH 2 DAYS Performed at [facility], [address] [place], [place] [address]    Report Status PENDING  Incomplete  Resp panel by RT-PCR (RSV, Flu A&B, Covid) Anterior Nasal Swab     Status: None   Collection Time: 03/25/23  6:49 PM   Specimen: Anterior Nasal Swab  Result Value Ref Range Status   SARS Coronavirus 2 by RT PCR NEGATIVE NEGATIVE Final    Comment: (NOTE) SARS-CoV-2 target nucleic acids are NOT DETECTED.  The SARS-CoV-2 RNA is generally detectable in upper respiratory specimens during the acute phase of infection. The lowest concentration of SARS-CoV-2 viral copies this assay can detect is 138 copies/mL. A negative result does not preclude SARS-Cov-2 infection and should not be used as the sole basis for treatment or other patient management decisions. A negative result may occur with  improper specimen collection/handling, submission of specimen other than nasopharyngeal swab, presence of viral mutation(s) within the areas targeted by this assay, and inadequate number of viral copies(<138 copies/mL). A negative result must be combined with clinical observations, patient history, and epidemiological information. The expected result is Negative.  Fact Sheet for Patients:  BloggerCourse.com  Fact Sheet for Healthcare Providers:  SeriousBroker.it  This test is no t yet approved or cleared by the [place] FDA and  has been authorized for detection and/or diagnosis of SARS-CoV-2 by FDA under an Emergency Use Authorization (EUA). This EUA will remain  in effect (meaning this test can be used) for the duration of the COVID-19 declaration under Section 564(b)(1) of the  Act, 21 U.S.C.section 360bbb-3(b)(1), unless the authorization is terminated  or revoked sooner.       Influenza A by PCR NEGATIVE NEGATIVE Final   Influenza B by  PCR NEGATIVE NEGATIVE Final    Comment: (NOTE) The Xpert Xpress SARS-CoV-2/FLU/RSV plus assay is intended as an aid in the diagnosis of influenza from Nasopharyngeal swab specimens and should not be used as a sole basis for treatment. Nasal washings and aspirates are unacceptable for Xpert Xpress SARS-CoV-2/FLU/RSV testing.  Fact Sheet for Patients: BloggerCourse.com  Fact Sheet for Healthcare Providers: SeriousBroker.it  This test is not yet approved or cleared by the [place] FDA and has been authorized for detection and/or diagnosis of SARS-CoV-2 by FDA under an Emergency Use Authorization (EUA). This EUA will remain in effect (meaning this test can be used) for the duration of the COVID-19 declaration under Section 564(b)(1) of the Act, 21 U.S.C. section 360bbb-3(b)(1), unless the authorization is terminated or revoked.     Resp Syncytial Virus by PCR NEGATIVE NEGATIVE Final    Comment: (NOTE) Fact Sheet for Patients: BloggerCourse.com  Fact Sheet for Healthcare Providers: SeriousBroker.it  This test is not yet approved or cleared by the [place] FDA and has been authorized for detection and/or diagnosis of SARS-CoV-2 by FDA under an Emergency Use Authorization (EUA). This EUA will remain in effect (meaning this test can be used) for the duration of the COVID-19 declaration under Section 564(b)(1) of the Act, 21 U.S.C. section 360bbb-3(b)(1), unless the authorization is terminated or revoked.  Performed at [facility], [address], [place], [place] [address]   Urine Culture     Status: Abnormal (Preliminary result)   Collection Time: 03/25/23 10:08 PM   Specimen: Urine, Random   Result Value Ref Range Status   Specimen Description   Final    URINE, RANDOM Performed at [facility], [address], [place], [place] [address]    Special Requests   Final    NONE Reflexed from [id number] Performed at [facility], [address] [place], [place] [address]    Culture >=100,000 COLONIES/mL ESCHERICHIA COLI (A)  Final   Report Status PENDING  Incomplete  Culture, blood (Routine x 2)     Status: None (Preliminary result)   Collection Time: 03/25/23 11:24 PM   Specimen: BLOOD  Result Value Ref Range Status   Specimen Description BLOOD BLOOD RIGHT HAND  Final   Special Requests   Final    BOTTLES DRAWN AEROBIC AND ANAEROBIC Blood Culture adequate volume   Culture   Final    NO GROWTH 1 DAY Performed at [facility], [address] [place], [place] [address]    Report Status PENDING  Incomplete     Labs: BNP (last 3 results) Recent Labs    03/25/23 1949  BNP 258.3*   Basic Metabolic Panel: Recent Labs  Lab 03/25/23 1814 03/26/23 0509  NA 139 138  K 4.8 3.4*  CL 102 102  CO2 26 27  GLUCOSE 134* 149*  BUN 26* 20  CREATININE 0.94 1.01*  CALCIUM 8.5* 8.3*   Liver Function Tests: Recent Labs  Lab 03/25/23 1814  AST 56*  ALT 38  ALKPHOS 132*  BILITOT 1.6*  PROT 6.9  ALBUMIN 4.0   No results for input(s): "LIPASE", "AMYLASE" in the last 168 hours. No results for input(s): "AMMONIA" in the last 168 hours. CBC: Recent Labs  Lab 03/25/23 1849 03/26/23 0509  WBC 11.7* 12.9*  NEUTROABS 10.4*  --   HGB 13.3 12.8  HCT 39.4 38.5  MCV 89.7 90.8  PLT 151 133*   Cardiac Enzymes: No results for input(s): "CKTOTAL", "CKMB", "CKMBINDEX", "TROPONINI" in the  last 168 hours. BNP: Invalid input(s): "POCBNP" CBG: Recent Labs  Lab 03/25/23 2319  GLUCAP 161*   D-Dimer No results for input(s): "DDIMER" in the last 72 hours. Hgb A1c No results for input(s): "HGBA1C" in the last 72 hours. Lipid Profile No results for  input(s): "CHOL", "HDL", "LDLCALC", "TRIG", "CHOLHDL", "LDLDIRECT" in the last 72 hours. Thyroid function studies No results for input(s): "TSH", "T4TOTAL", "T3FREE", "THYROIDAB" in the last 72 hours.  Invalid input(s): "FREET3" Anemia work up No results for input(s): "VITAMINB12", "FOLATE", "FERRITIN", "TIBC", "IRON", "RETICCTPCT" in the last 72 hours. Urinalysis    Component Value Date/Time   COLORURINE YELLOW (A) 03/25/2023 2208   APPEARANCEUR HAZY (A) 03/25/2023 2208   APPEARANCEUR Clear 11/18/2019 1359   LABSPEC 1.040 (H) 03/25/2023 2208   LABSPEC 1.011 08/30/2013 0053   PHURINE 5.0 03/25/2023 2208   GLUCOSEU NEGATIVE 03/25/2023 2208   GLUCOSEU see comment 08/30/2013 0053   HGBUR SMALL (A) 03/25/2023 2208   BILIRUBINUR NEGATIVE 03/25/2023 2208   BILIRUBINUR negative 02/07/2021 1828   BILIRUBINUR Negative 11/18/2019 1359   BILIRUBINUR see comment 08/30/2013 0053   KETONESUR NEGATIVE 03/25/2023 2208   PROTEINUR NEGATIVE 03/25/2023 2208   UROBILINOGEN 0.2 02/07/2021 1828   NITRITE POSITIVE (A) 03/25/2023 2208   LEUKOCYTESUR MODERATE (A) 03/25/2023 2208   LEUKOCYTESUR see comment 08/30/2013 0053   Sepsis Labs Recent Labs  Lab 03/25/23 1849 03/26/23 0509  WBC 11.7* 12.9*   Microbiology Recent Results (from the past 240 hour(s))  Culture, blood (Routine x 2)     Status: None (Preliminary result)   Collection Time: 03/25/23  6:14 PM   Specimen: BLOOD  Result Value Ref Range Status   Specimen Description BLOOD BLOOD LEFT FOREARM  Final   Special Requests   Final    BOTTLES DRAWN AEROBIC AND ANAEROBIC Blood Culture results may not be optimal due to an inadequate volume of blood received in culture bottles   Culture   Final    NO GROWTH 2 DAYS Performed at [facility], [address], [place], [place] [address]    Report Status PENDING  Incomplete  Resp panel by RT-PCR (RSV, Flu A&B, Covid) Anterior Nasal Swab     Status: None   Collection Time: 03/25/23   6:49 PM   Specimen: Anterior Nasal Swab  Result Value Ref Range Status   SARS Coronavirus 2 by RT PCR NEGATIVE NEGATIVE Final    Comment: (NOTE) SARS-CoV-2 target nucleic acids are NOT DETECTED.  The SARS-CoV-2 RNA is generally detectable in upper respiratory specimens during the acute phase of infection. The lowest concentration of SARS-CoV-2 viral copies this assay can detect is 138 copies/mL. A negative result does not preclude SARS-Cov-2 infection and should not be used as the sole basis for treatment or other patient management decisions. A negative result may occur with  improper specimen collection/handling, submission of specimen other than nasopharyngeal swab, presence of viral mutation(s) within the areas targeted by this assay, and inadequate number of viral copies(<138 copies/mL). A negative result must be combined with clinical observations, patient history, and epidemiological information. The expected result is Negative.  Fact Sheet for Patients:  BloggerCourse.com  Fact Sheet for Healthcare Providers:  SeriousBroker.it  This test is no t yet approved or cleared by the [place] FDA and  has been authorized for detection and/or diagnosis of SARS-CoV-2 by FDA under an Emergency Use Authorization (EUA). This EUA will remain  in effect (meaning this test can be used) for the duration of the  COVID-19 declaration under Section 564(b)(1) of the Act, 21 U.S.C.section 360bbb-3(b)(1), unless the authorization is terminated  or revoked sooner.       Influenza A by PCR NEGATIVE NEGATIVE Final   Influenza B by PCR NEGATIVE NEGATIVE Final    Comment: (NOTE) The Xpert Xpress SARS-CoV-2/FLU/RSV plus assay is intended as an aid in the diagnosis of influenza from Nasopharyngeal swab specimens and should not be used as a sole basis for treatment. Nasal washings and aspirates are unacceptable for Xpert Xpress  SARS-CoV-2/FLU/RSV testing.  Fact Sheet for Patients: BloggerCourse.com  Fact Sheet for Healthcare Providers: SeriousBroker.it  This test is not yet approved or cleared by the [place] FDA and has been authorized for detection and/or diagnosis of SARS-CoV-2 by FDA under an Emergency Use Authorization (EUA). This EUA will remain in effect (meaning this test can be used) for the duration of the COVID-19 declaration under Section 564(b)(1) of the Act, 21 U.S.C. section 360bbb-3(b)(1), unless the authorization is terminated or revoked.     Resp Syncytial Virus by PCR NEGATIVE NEGATIVE Final    Comment: (NOTE) Fact Sheet for Patients: BloggerCourse.com  Fact Sheet for Healthcare Providers: SeriousBroker.it  This test is not yet approved or cleared by the [place] FDA and has been authorized for detection and/or diagnosis of SARS-CoV-2 by FDA under an Emergency Use Authorization (EUA). This EUA will remain in effect (meaning this test can be used) for the duration of the COVID-19 declaration under Section 564(b)(1) of the Act, 21 U.S.C. section 360bbb-3(b)(1), unless the authorization is terminated or revoked.  Performed at [facility], [address] [place], [place] [address]   Urine Culture     Status: Abnormal (Preliminary result)   Collection Time: 03/25/23 10:08 PM   Specimen: Urine, Random  Result Value Ref Range Status   Specimen Description   Final    URINE, RANDOM Performed at [facility], [address] [place], [place] [address]    Special Requests   Final    NONE Reflexed from [id number] Performed at [facility], [address] [place], [place] [address]    Culture >=100,000 COLONIES/mL ESCHERICHIA COLI (A)  Final   Report Status PENDING  Incomplete  Culture, blood (Routine x 2)     Status: None (Preliminary result)    Collection Time: 03/25/23 11:24 PM   Specimen: BLOOD  Result Value Ref Range Status   Specimen Description BLOOD BLOOD RIGHT HAND  Final   Special Requests   Final    BOTTLES DRAWN AEROBIC AND ANAEROBIC Blood Culture adequate volume   Culture   Final    NO GROWTH 1 DAY Performed at [facility], [address] [place], [place] [address]    Report Status PENDING  Incomplete   Imaging CT Angio Chest PE W and/or Wo Contrast  Result Date: 03/25/2023 CLINICAL DATA:  Acute onset short of breath with hypoxia EXAM: CT ANGIOGRAPHY CHEST WITH CONTRAST TECHNIQUE: Multidetector CT imaging of the chest was performed using the standard protocol during bolus administration of intravenous contrast. Multiplanar CT image reconstructions and MIPs were obtained to evaluate the vascular anatomy. RADIATION DOSE REDUCTION: This exam was performed according to the departmental dose-optimization program which includes automated exposure control, adjustment of the mA and/or kV according to patient size and/or use of iterative reconstruction technique. CONTRAST:  OMNIPAQUE IOHEXOL 350 MG/ML SOLN COMPARISON:  Chest x-ray 03/25/2023 FINDINGS: Cardiovascular: Satisfactory opacification of the pulmonary arteries to the segmental level. No evidence of pulmonary  embolism. Mild aortic atherosclerosis. No aneurysm. Coronary vascular calcification. Mild cardiomegaly. No pericardial effusion Mediastinum/Nodes: Midline trachea. No thyroid mass. No suspicious lymph nodes. Esophagus within normal limits. Lungs/Pleura: No acute airspace disease, pleural effusion, or pneumothorax. Mild atelectasis or scarring at the right base Upper Abdomen: No acute finding. Musculoskeletal: No acute osseous abnormality. Review of the MIP images confirms the above findings. IMPRESSION: 1. Negative for acute pulmonary embolus or aortic dissection. 2. Mild cardiomegaly. 3. Aortic atherosclerosis. Aortic Atherosclerosis (ICD10-I70.0).  Electronically Signed   By: Jasmine Pang M.D.   On: 03/25/2023 21:58   DG Chest Port 1 View  Result Date: 03/25/2023 CLINICAL DATA:  Sepsis.  Fever.  Urinary tract infection. EXAM: PORTABLE CHEST 1 VIEW COMPARISON:  03/08/2022 FINDINGS: Cardiac enlargement. No vascular congestion, edema, or consolidation representing improvement since prior study. No pleural effusions. No pneumothorax. Calcification of the aorta. Degenerative changes in the spine. IMPRESSION: Cardiac enlargement. Lungs are clear demonstrating improvement since prior study. Electronically Signed   By: Burman Nieves M.D.   On: 03/25/2023 20:07      Time coordinating discharge: over 30 minutes  SIGNED:  Sunnie Nielsen DO [facility]

## 2023-03-27 NOTE — Plan of Care (Signed)
  Problem: Fluid Volume: Goal: Hemodynamic stability will improve Outcome: Completed/Met   Problem: Clinical Measurements: Goal: Diagnostic test results will improve Outcome: Completed/Met Goal: Signs and symptoms of infection will decrease Outcome: Completed/Met   Problem: Respiratory: Goal: Ability to maintain adequate ventilation will improve Outcome: Completed/Met   Problem: Education: Goal: Knowledge of General Education information will improve Description: Including pain rating scale, medication(s)/side effects and non-pharmacologic comfort measures Outcome: Completed/Met   Problem: Health Behavior/Discharge Planning: Goal: Ability to manage health-related needs will improve Outcome: Completed/Met   Problem: Clinical Measurements: Goal: Ability to maintain clinical measurements within normal limits will improve Outcome: Completed/Met Goal: Will remain free from infection Outcome: Completed/Met Goal: Diagnostic test results will improve Outcome: Completed/Met Goal: Respiratory complications will improve Outcome: Completed/Met Goal: Cardiovascular complication will be avoided Outcome: Completed/Met   Problem: Activity: Goal: Risk for activity intolerance will decrease Outcome: Completed/Met   Problem: Nutrition: Goal: Adequate nutrition will be maintained Outcome: Completed/Met   Problem: Elimination: Goal: Will not experience complications related to bowel motility Outcome: Completed/Met Goal: Will not experience complications related to urinary retention Outcome: Completed/Met   Problem: Coping: Goal: Level of anxiety will decrease Outcome: Completed/Met   Problem: Pain Management: Goal: General experience of comfort will improve Outcome: Completed/Met   Problem: Safety: Goal: Ability to remain free from injury will improve Outcome: Completed/Met   Problem: Acute Rehab PT Goals(only PT should resolve) Goal: Pt Will Go Supine/Side To Sit Outcome:  Completed/Met Goal: Patient Will Transfer Sit To/From Stand Outcome: Completed/Met Goal: Pt Will Transfer Bed To Chair/Chair To Bed Outcome: Completed/Met Goal: Pt Will Ambulate Outcome: Completed/Met   Problem: Acute Rehab OT Goals (only OT should resolve) Goal: Pt. Will Perform Lower Body Bathing Outcome: Completed/Met Goal: Pt. Will Perform Lower Body Dressing Outcome: Completed/Met Goal: Pt. Will Transfer To Toilet Outcome: Completed/Met Goal: Pt. Will Perform Toileting-Clothing Manipulation Outcome: Completed/Met Goal: OT Additional ADL Goal #1 Outcome: Completed/Met

## 2023-03-27 NOTE — Care Management Important Message (Signed)
Important Message  Patient Details  Name: Victoria Dalton MRN: [id number] Date of Birth: May 18, 1931   Important Message Given:  N/A - LOS <3 / Initial given by admissions     Victoria Dalton 03/27/2023, 8:46 AM

## 2023-03-28 LAB — URINE CULTURE: Culture: >=100000 — AB

## 2023-03-30 LAB — CULTURE, BLOOD (ROUTINE X 2): Culture: NO GROWTH

## 2023-03-31 DIAGNOSIS — A419 Sepsis, unspecified organism: Secondary | ICD-10-CM | POA: Diagnosis not present

## 2023-03-31 DIAGNOSIS — R6521 Severe sepsis with septic shock: Secondary | ICD-10-CM | POA: Diagnosis not present

## 2023-03-31 LAB — CULTURE, BLOOD (ROUTINE X 2)
Culture: NO GROWTH
Special Requests: ADEQUATE

## 2023-04-06 NOTE — Progress Notes (Signed)
[facility] - Clinic Note  04/13/2023     CHIEF COMPLAINT Patient presents for Retina Follow Up    HISTORY OF PRESENT ILLNESS: Victoria Dalton is a 89 y.o. female who presents to the clinic today for:  HPI     Retina Follow Up   Patient presents with  Dry AMD.  In both eyes.  Severity is moderate.  Duration of 9 months.  Since onset it is stable.  I, the attending physician,  performed the HPI with the patient and updated documentation appropriately.        Comments   9 month Retina eval fot Dry Armd. Patient states no vision change noticed. Patient is doing a lot of reading      Last edited by Rennis Chris, MD on 04/14/2023  8:01 PM.     Pt states vision is stable, she states her left eye has been watering this week, she is using AT's 2-3x a day, she states her blood pressure pill and water pill has been reduced, she is taking AREDS 2  Referring physician: Tyrone Schimke, MD [id number] [place], [place] [address]  HISTORICAL INFORMATION:   Selected notes from the MEDICAL RECORD NUMBER Referred by Dr. Delaney Meigs LEE: 02/22/2020 BCVA: OD 20/70, OS 20/70 Ocular Hx- Mac cyst OU, Cataracts OU, Band Keratopathy OU, ARMD OU PMH- DM   CURRENT MEDICATIONS: Current Outpatient Medications (Ophthalmic Drugs)  Medication Sig   carboxymethylcellulose (REFRESH PLUS) 0.5 % SOLN 1 drop 3 (three) times daily as needed.   No current facility-administered medications for this visit. (Ophthalmic Drugs)   Current Outpatient Medications (Other)  Medication Sig   acetaminophen (TYLENOL) 500 MG tablet Take 500 mg by mouth every 6 (six) hours as needed for mild pain.   apixaban (ELIQUIS) 5 MG TABS tablet Take 1 tablet (5 mg total) by mouth 2 (two) times daily.   diltiazem (CARDIZEM CD) 240 MG 24 hr capsule Take 1 capsule (240 mg total) by mouth daily.   furosemide (LASIX) 40 MG tablet Take 1 tablet (40 mg total) by mouth daily. HOLD FOR NOW, TAKE HALF  TABLET IF NEEDED FOR LEG SWELLING   glimepiride (AMARYL) 4 MG tablet TAKE 1 TABLET(4 MG) BY MOUTH DAILY BEFORE BREAKFAST   glucose blood (ONETOUCH ULTRA) test strip USE AS DIRECTED   losartan (COZAAR) 25 MG tablet Take 1 tablet (25 mg total) by mouth daily. HOLD FOR NOW, UNTIL FOLLOW UP WITH PRIMARY CARE   metoprolol succinate (TOPROL-XL) 25 MG 24 hr tablet Take 0.5 tablets (12.5 mg total) by mouth daily. Take with or immediately following a meal. LOWER DOSE - HOLD THE 100 MG DOSE UNTIL FOLLOW UP WITH PRIMARY CARE   Multiple Vitamins-Minerals (PRESERVISION AREDS 2 PO) Take by mouth.   pantoprazole (PROTONIX) 40 MG tablet TAKE 1 TABLET(40 MG) BY MOUTH DAILY   simvastatin (ZOCOR) 40 MG tablet Take 1 tablet (40 mg total) by mouth daily.   No current facility-administered medications for this visit. (Other)   REVIEW OF SYSTEMS: ROS   Positive for: Gastrointestinal, Endocrine, Eyes Negative for: Constitutional, Neurological, Skin, Genitourinary, Musculoskeletal, HENT, Cardiovascular, Respiratory, Psychiatric, Allergic/Imm, Heme/Lymph Last edited by Lana Fish, COT on 04/13/2023  1:10 PM.      ALLERGIES Allergies  Allergen Reactions   Chlorhexidine Itching   Ramipril Other (See Comments)    IRREGULAR HEART BEAT   Penicillins Rash    Has patient had a PCN reaction causing immediate rash, facial/tongue/throat swelling, SOB or  lightheadedness with hypotension: Yes Has patient had a PCN reaction causing severe rash involving mucus membranes or skin necrosis: Yes Has patient had a PCN reaction that required hospitalization No Has patient had a PCN reaction occurring within the last 10 years: No If all of the above answers are "NO", then may proceed with Cephalosporin use.    Poison Oak Extract Rash   Sulfa Antibiotics Rash   PAST MEDICAL HISTORY Past Medical History:  Diagnosis Date   Atrial fibrillation (HCC)    CHF (congestive heart failure) (HCC)    Diabetes mellitus without  complication (HCC)    Dyspnea    Gallbladder calculus    GERD (gastroesophageal reflux disease)    Hyperlipidemia    Hypertension    Hypertensive retinopathy    OU   Kidney stone    Macular degeneration    Dry OU   Mass    Parotid swelling    UTI (lower urinary tract infection)    Past Surgical History:  Procedure Laterality Date   CATARACT EXTRACTION Bilateral 02/2020   Dr. Tyrone Schimke   CHOLECYSTECTOMY N/A 04/30/2016   Procedure: CHOLECYSTECTOMY;  Surgeon: Leafy Ro, MD;  Location: [facility] ORS;  Service: General;  Laterality: N/A;   DIAGNOSTIC LAPAROSCOPIC LIVER BIOPSY  02/28/2016   Procedure: DIAGNOSTIC LAPAROSCOPIC LIVER BIOPSY;  Surgeon: Leafy Ro, MD;  Location: [facility] ORS;  Service: General;;   ERCP N/A 01/13/2016   Procedure: ENDOSCOPIC RETROGRADE CHOLANGIOPANCREATOGRAPHY (ERCP);  Surgeon: Midge Minium, MD;  Location: [facility] ENDOSCOPY;  Service: Endoscopy;  Laterality: N/A;   ERCP N/A 04/08/2016   Procedure: ENDOSCOPIC RETROGRADE CHOLANGIOPANCREATOGRAPHY (ERCP) Stent removal;  Surgeon: Midge Minium, MD;  Location: [facility] ENDOSCOPY;  Service: Endoscopy;  Laterality: N/A;   EYE SURGERY Bilateral 02/2020   Cat Sx - Dr. Tyrone Schimke   INTRAOPERATIVE CHOLANGIOGRAM  04/30/2016   Procedure: INTRAOPERATIVE CHOLANGIOGRAM;  Surgeon: Leafy Ro, MD;  Location: [facility] ORS;  Service: General;;   KNEE ARTHROSCOPY Left    LAPAROSCOPY  02/28/2016   Procedure: Diagnotic laparoscopy with omental biopsy;  Surgeon: Leafy Ro, MD;  Location: [facility] ORS;  Service: General;;   TONSILLECTOMY     FAMILY HISTORY Family History  Problem Relation Age of Onset   Cancer Father    Heart attack Father    Cancer Brother    Diabetes Brother    Diabetes Son    Breast cancer Neg Hx    SOCIAL HISTORY Social History   Tobacco Use   Smoking status: Never   Smokeless tobacco: Never  Vaping Use   Vaping status: Never Used  Substance Use Topics   Alcohol use: No   Drug use: No        OPHTHALMIC EXAM:  Base Eye Exam     Visual Acuity (Snellen - Linear)       Right Left   Dist cc 20/30-1 20/50-2   Dist ph cc 20/NI 20/NI    Correction: Glasses         Tonometry (Tonopen, 1:11 PM)       Right Left   Pressure 12 13         Pupils       Dark Light Shape React APD   Right 3 2 Round Brisk None   Left 3 2 Round Brisk None         Visual Fields (Counting fingers)       Left Right    Full Full  Extraocular Movement       Right Left    Full, Ortho Full, Ortho         Neuro/Psych     Oriented x3: Yes   Mood/Affect: Normal         Dilation     Both eyes: 1.0% Mydriacyl, 2.5% Phenylephrine @ 1:11 PM           Slit Lamp and Fundus Exam     Slit Lamp Exam       Right Left   Lids/Lashes Dermatochalasis - upper lid, Meibomian gland dysfunction - improved Dermatochalasis - upper lid, upper lid swelling   Conjunctiva/Sclera Mild inferior conjunctivochalasis White and quiet   Cornea early band K nasal and temporal extending centrally, 2+ PEE centrally, arcus, well healed temporal cataract wounds, tear film debris early band K nasal and temporal extending centrally, 2+PEE, arcus, well healed temporal cataract wounds   Anterior Chamber deep and clear deep and clear   Iris Round and dilated Round and dilated   Lens PC IOL in good position, 1+ Posterior capsular opacification PC IOL in good position, 1+ Posterior capsular opacification   Anterior Vitreous Vitreous syneresis, Posterior vitreous detachment Vitreous syneresis, Posterior vitreous detachment         Fundus Exam       Right Left   Disc mild pallor, Sharp rim, +Peripapillary atrophy Pink and Sharp, temporal Peripapillary atrophy   C/D Ratio 0.4 0.5   Macula Flat, Blunted foveal reflex, RPE mottling, clumping and atrophy centrally, No heme or edema, mild vitelliform like lesion Flat, Blunted foveal reflex, RPE mottling, clumping and atrophy greatest centrally, No heme  or edema   Vessels attenuated, Tortuous attenuated, Tortuous   Periphery Attached, reticular degeneration, No heme Attached, reticular degeneration, No heme           Refraction     Wearing Rx       Sphere Cylinder Axis Add   Right -1.00 +1.25 168 +3.00   Left -1.00 +1.00 180 +3.00           IMAGING AND PROCEDURES  Imaging and Procedures for 04/13/2023  OCT, Retina - OU - Both Eyes       Right Eye Quality was good. Central Foveal Thickness: 234. Progression has been stable. Findings include normal foveal contour, no IRF, no SRF, retinal drusen , subretinal hyper-reflective material, outer retinal atrophy (persistent focal central SRHM -- vitelliform like lesion; no central SRF, patchy ORA centrally).   Left Eye Quality was good. Central Foveal Thickness: 243. Progression has been stable. Findings include normal foveal contour, no IRF, no SRF, subretinal hyper-reflective material, outer retinal atrophy (focal central SRHM; no SRF, patchy ORA centrally).   Notes *Images captured and stored on drive  Diagnosis / Impression:  NFP, no IRF/SRF OU OD: persistent focal central SRHM -- vitelliform-like lesion; patchy ORA OS: focal central SRHM; patchy ORA  Clinical management:  See below  Abbreviations: NFP - Normal foveal profile. CME - cystoid macular edema. PED - pigment epithelial detachment. IRF - intraretinal fluid. SRF - subretinal fluid. EZ - ellipsoid zone. ERM - epiretinal membrane. ORA - outer retinal atrophy. ORT - outer retinal tubulation. SRHM - subretinal hyper-reflective material. IRHM - intraretinal hyper-reflective material            ASSESSMENT/PLAN:    ICD-10-CM   1. Intermediate stage nonexudative age-related macular degeneration of both eyes  H35.3132 OCT, Retina - OU - Both Eyes    2. Diabetes mellitus type 2  without retinopathy (HCC)  E11.9     3. Essential hypertension  I10     4. Hypertensive retinopathy of both eyes  H35.033     5.  Pseudophakia of both eyes  Z96.1     6. Band keratopathy of both eyes  H18.423      1. Age related macular degeneration, non-exudative, both eyes  - The incidence, anatomy, and pathology of dry AMD, risk of progression, and the AREDS and AREDS 2 studies including smoking risks discussed with patient.  - BCVA OD 20/30 from 20/50; OS: 20/50 -- stable  - OCT shows OD: persistent focal central SRHM -- vitelliform-like lesion; patchy ORA; OS: focal central SRHM; patchy ORA  - Continue Amsler Grid monitoring  - f/u 9 months, DFE, OCT  2. Diabetes mellitus, type 2 without retinopathy - The incidence, risk factors for progression, natural history and treatment options for diabetic retinopathy  were discussed with patient.   - The need for close monitoring of blood glucose, blood pressure, and serum lipids, avoiding cigarette or any type of tobacco, and the need for long term follow up was also discussed with patient. - f/u in 1 year, sooner prn  3,4. Hypertensive retinopathy OU - discussed importance of tight BP control - monitor  5. Pseudophakia OU  - s/p CE/IOL w/Dr. Delaney Meigs [phone]  - IOL in good position, doing well - monitor  6. Band keratopathy OU  - cont ATs and lubricating gel  - currently on Xdemvy per Dr. Delaney Meigs  Ophthalmic Meds Ordered this visit:  No orders of the defined types were placed in this encounter.    Return in about 9 months (around 01/11/2024) for f/u non-exu ARMD OU, DFE, OCT.  There are no Patient Instructions on file for this visit.  This document serves as a record of services personally performed by Karie Chimera, MD, PhD. It was created on their behalf by Glee Arvin. Manson Passey, OA an ophthalmic technician. The creation of this record is the provider's dictation and/or activities during the visit.    Electronically signed by: Glee Arvin. Manson Passey, OA 04/14/23 8:09 PM  Karie Chimera, M.D., Ph.D. Diseases & Surgery of the Retina and Vitreous [facility]  I have reviewed the above documentation for accuracy and completeness, and I agree with the above. Karie Chimera, M.D., Ph.D. 04/14/23 8:10 PM   Abbreviations: M myopia (nearsighted); A astigmatism; H hyperopia (farsighted); P presbyopia; Mrx spectacle prescription;  CTL contact lenses; OD right eye; OS left eye; OU both eyes  XT exotropia; ET esotropia; PEK punctate epithelial keratitis; PEE punctate epithelial erosions; DES dry eye syndrome; MGD meibomian gland dysfunction; ATs artificial tears; PFAT's preservative free artificial tears; NSC nuclear sclerotic cataract; PSC posterior subcapsular cataract; ERM epi-retinal membrane; PVD posterior vitreous detachment; RD retinal detachment; DM diabetes mellitus; DR diabetic retinopathy; NPDR non-proliferative diabetic retinopathy; PDR proliferative diabetic retinopathy; CSME clinically significant macular edema; DME diabetic macular edema; dbh dot blot hemorrhages; CWS cotton wool spot; POAG primary open angle glaucoma; C/D cup-to-disc ratio; HVF humphrey visual field; GVF goldmann visual field; OCT optical coherence tomography; IOP intraocular pressure; BRVO Branch retinal vein occlusion; CRVO central retinal vein occlusion; CRAO central retinal artery occlusion; BRAO branch retinal artery occlusion; RT retinal tear; SB scleral buckle; PPV pars plana vitrectomy; VH Vitreous hemorrhage; PRP panretinal laser photocoagulation; IVK intravitreal kenalog; VMT vitreomacular traction; MH Macular hole;  NVD neovascularization of the disc; NVE neovascularization elsewhere; AREDS age related eye disease study;  ARMD age related macular degeneration; POAG primary open angle glaucoma; EBMD epithelial/anterior basement membrane dystrophy; ACIOL anterior chamber intraocular lens; IOL intraocular lens; PCIOL posterior chamber intraocular lens; Phaco/IOL phacoemulsification with intraocular lens placement; PRK photorefractive keratectomy; LASIK  laser assisted in situ keratomileusis; HTN hypertension; DM diabetes mellitus; COPD chronic obstructive pulmonary disease

## 2023-04-07 DIAGNOSIS — I4891 Unspecified atrial fibrillation: Secondary | ICD-10-CM | POA: Diagnosis not present

## 2023-04-07 DIAGNOSIS — E119 Type 2 diabetes mellitus without complications: Secondary | ICD-10-CM | POA: Diagnosis not present

## 2023-04-07 DIAGNOSIS — I10 Essential (primary) hypertension: Secondary | ICD-10-CM | POA: Diagnosis not present

## 2023-04-09 DIAGNOSIS — E785 Hyperlipidemia, unspecified: Secondary | ICD-10-CM | POA: Diagnosis not present

## 2023-04-09 DIAGNOSIS — I5032 Chronic diastolic (congestive) heart failure: Secondary | ICD-10-CM | POA: Diagnosis not present

## 2023-04-09 DIAGNOSIS — N183 Chronic kidney disease, stage 3 unspecified: Secondary | ICD-10-CM | POA: Diagnosis not present

## 2023-04-09 DIAGNOSIS — K219 Gastro-esophageal reflux disease without esophagitis: Secondary | ICD-10-CM | POA: Diagnosis not present

## 2023-04-09 DIAGNOSIS — I10 Essential (primary) hypertension: Secondary | ICD-10-CM | POA: Diagnosis not present

## 2023-04-09 DIAGNOSIS — R0602 Shortness of breath: Secondary | ICD-10-CM | POA: Diagnosis not present

## 2023-04-09 DIAGNOSIS — G4733 Obstructive sleep apnea (adult) (pediatric): Secondary | ICD-10-CM | POA: Diagnosis not present

## 2023-04-09 DIAGNOSIS — E119 Type 2 diabetes mellitus without complications: Secondary | ICD-10-CM | POA: Diagnosis not present

## 2023-04-09 DIAGNOSIS — I4891 Unspecified atrial fibrillation: Secondary | ICD-10-CM | POA: Diagnosis not present

## 2023-04-13 ENCOUNTER — Encounter (INDEPENDENT_AMBULATORY_CARE_PROVIDER_SITE_OTHER): Payer: Self-pay | Admitting: Ophthalmology

## 2023-04-13 ENCOUNTER — Ambulatory Visit (INDEPENDENT_AMBULATORY_CARE_PROVIDER_SITE_OTHER): Payer: Medicare Other | Admitting: Ophthalmology

## 2023-04-13 DIAGNOSIS — Z961 Presence of intraocular lens: Secondary | ICD-10-CM

## 2023-04-13 DIAGNOSIS — E119 Type 2 diabetes mellitus without complications: Secondary | ICD-10-CM

## 2023-04-13 DIAGNOSIS — H18423 Band keratopathy, bilateral: Secondary | ICD-10-CM

## 2023-04-13 DIAGNOSIS — H35033 Hypertensive retinopathy, bilateral: Secondary | ICD-10-CM

## 2023-04-13 DIAGNOSIS — H353132 Nonexudative age-related macular degeneration, bilateral, intermediate dry stage: Secondary | ICD-10-CM | POA: Diagnosis not present

## 2023-04-13 DIAGNOSIS — I10 Essential (primary) hypertension: Secondary | ICD-10-CM

## 2023-04-13 DIAGNOSIS — H25813 Combined forms of age-related cataract, bilateral: Secondary | ICD-10-CM

## 2023-04-14 ENCOUNTER — Encounter (INDEPENDENT_AMBULATORY_CARE_PROVIDER_SITE_OTHER): Payer: Self-pay | Admitting: Ophthalmology

## 2023-04-18 ENCOUNTER — Other Ambulatory Visit: Payer: Self-pay | Admitting: Family

## 2023-06-11 DIAGNOSIS — N816 Rectocele: Secondary | ICD-10-CM | POA: Diagnosis not present

## 2023-06-11 DIAGNOSIS — N8111 Cystocele, midline: Secondary | ICD-10-CM | POA: Diagnosis not present

## 2023-06-30 DIAGNOSIS — G8929 Other chronic pain: Secondary | ICD-10-CM | POA: Diagnosis not present

## 2023-06-30 DIAGNOSIS — M25551 Pain in right hip: Secondary | ICD-10-CM | POA: Diagnosis not present

## 2023-06-30 DIAGNOSIS — I509 Heart failure, unspecified: Secondary | ICD-10-CM | POA: Diagnosis not present

## 2023-06-30 DIAGNOSIS — I4891 Unspecified atrial fibrillation: Secondary | ICD-10-CM | POA: Diagnosis not present

## 2023-06-30 DIAGNOSIS — E1122 Type 2 diabetes mellitus with diabetic chronic kidney disease: Secondary | ICD-10-CM | POA: Diagnosis not present

## 2023-06-30 DIAGNOSIS — I130 Hypertensive heart and chronic kidney disease with heart failure and stage 1 through stage 4 chronic kidney disease, or unspecified chronic kidney disease: Secondary | ICD-10-CM | POA: Diagnosis not present

## 2023-06-30 DIAGNOSIS — N182 Chronic kidney disease, stage 2 (mild): Secondary | ICD-10-CM | POA: Diagnosis not present

## 2023-06-30 DIAGNOSIS — N183 Chronic kidney disease, stage 3 unspecified: Secondary | ICD-10-CM | POA: Diagnosis not present

## 2023-06-30 DIAGNOSIS — I10 Essential (primary) hypertension: Secondary | ICD-10-CM | POA: Diagnosis not present

## 2023-06-30 DIAGNOSIS — E785 Hyperlipidemia, unspecified: Secondary | ICD-10-CM | POA: Diagnosis not present

## 2023-07-07 DIAGNOSIS — I5032 Chronic diastolic (congestive) heart failure: Secondary | ICD-10-CM | POA: Diagnosis not present

## 2023-07-07 DIAGNOSIS — R5381 Other malaise: Secondary | ICD-10-CM | POA: Diagnosis not present

## 2023-07-07 DIAGNOSIS — R600 Localized edema: Secondary | ICD-10-CM | POA: Diagnosis not present

## 2023-07-07 DIAGNOSIS — R0609 Other forms of dyspnea: Secondary | ICD-10-CM | POA: Diagnosis not present

## 2023-08-12 ENCOUNTER — Ambulatory Visit: Payer: Medicare Other | Admitting: Family

## 2023-08-12 ENCOUNTER — Encounter: Payer: Self-pay | Admitting: Family

## 2023-08-12 ENCOUNTER — Other Ambulatory Visit
Admission: RE | Admit: 2023-08-12 | Discharge: 2023-08-12 | Disposition: A | Discharge status: Home / Self Care | Source: Ambulatory Visit | Attending: Family | Admitting: Family

## 2023-08-12 VITALS — BP 120/69 | HR 72 | Wt 245.0 lb

## 2023-08-12 DIAGNOSIS — E1122 Type 2 diabetes mellitus with diabetic chronic kidney disease: Secondary | ICD-10-CM | POA: Insufficient documentation

## 2023-08-12 DIAGNOSIS — I5032 Chronic diastolic (congestive) heart failure: Secondary | ICD-10-CM

## 2023-08-12 DIAGNOSIS — E785 Hyperlipidemia, unspecified: Secondary | ICD-10-CM | POA: Insufficient documentation

## 2023-08-12 DIAGNOSIS — Z7901 Long term (current) use of anticoagulants: Secondary | ICD-10-CM | POA: Insufficient documentation

## 2023-08-12 DIAGNOSIS — N189 Chronic kidney disease, unspecified: Secondary | ICD-10-CM | POA: Insufficient documentation

## 2023-08-12 DIAGNOSIS — Z7984 Long term (current) use of oral hypoglycemic drugs: Secondary | ICD-10-CM | POA: Insufficient documentation

## 2023-08-12 DIAGNOSIS — Z79899 Other long term (current) drug therapy: Secondary | ICD-10-CM | POA: Insufficient documentation

## 2023-08-12 DIAGNOSIS — I130 Hypertensive heart and chronic kidney disease with heart failure and stage 1 through stage 4 chronic kidney disease, or unspecified chronic kidney disease: Secondary | ICD-10-CM | POA: Insufficient documentation

## 2023-08-12 DIAGNOSIS — H3530 Unspecified macular degeneration: Secondary | ICD-10-CM | POA: Insufficient documentation

## 2023-08-12 DIAGNOSIS — I4891 Unspecified atrial fibrillation: Secondary | ICD-10-CM | POA: Insufficient documentation

## 2023-08-12 DIAGNOSIS — N182 Chronic kidney disease, stage 2 (mild): Secondary | ICD-10-CM | POA: Diagnosis not present

## 2023-08-12 DIAGNOSIS — I480 Paroxysmal atrial fibrillation: Secondary | ICD-10-CM | POA: Diagnosis not present

## 2023-08-12 DIAGNOSIS — I10 Essential (primary) hypertension: Secondary | ICD-10-CM

## 2023-08-12 DIAGNOSIS — Z87440 Personal history of urinary (tract) infections: Secondary | ICD-10-CM | POA: Insufficient documentation

## 2023-08-12 LAB — BASIC METABOLIC PANEL
Anion gap: 10 (ref 5–15)
BUN: 28 mg/dL — ABNORMAL HIGH (ref 8–23)
CO2: 27 mmol/L (ref 22–32)
Calcium: 9 mg/dL (ref 8.9–10.3)
Chloride: 102 mmol/L (ref 98–111)
Creatinine, Ser: 0.88 mg/dL (ref 0.44–1.00)
GFR, Estimated: >60 mL/min (ref >60)
Glucose, Bld: 146 mg/dL — ABNORMAL HIGH (ref 70–99)
Potassium: 4 mmol/L (ref 3.5–5.1)
Sodium: 139 mmol/L (ref 135–145)

## 2023-08-12 MED ORDER — FUROSEMIDE 40 MG PO TABS: 40.0000 mg | ORAL_TABLET | ORAL | 3 refills | Status: DC

## 2023-08-12 NOTE — Patient Instructions (Signed)
 Medication Changes:  No medication changes  Lab Work:  Go DOWN to LOWER LEVEL (LL) to have your blood work completed inside of [employer] office.  We will only call you if the results are abnormal or if the provider would like to make medication changes.   Follow-Up in: 1 month with Clarisa Kindred, FNP.  At the [facility], you and your health needs are our priority. We have a designated team specialized in the treatment of Heart Failure. This Care Team includes your primary Heart Failure Specialized Cardiologist (physician), Advanced Practice Providers (APPs- Physician Assistants and Nurse Practitioners), and Pharmacist who all work together to provide you with the care you need, when you need it.   You may see any of the following providers on your designated Care Team at your next follow up:  Dr. Arvilla Meres Dr. Marca Ancona Dr. Dorthula Nettles Dr. Theresia Bough Clarisa Kindred, FNP Enos Fling, RPH-CPP  Please be sure to bring in all your medications bottles to every appointment.   Need to Contact us:  If you have any questions or concerns before your next appointment please send [place] a message through [place] or call our office at [phone].    TO LEAVE A MESSAGE FOR THE NURSE SELECT OPTION 2, PLEASE LEAVE A MESSAGE INCLUDING: YOUR NAME DATE OF BIRTH CALL BACK NUMBER REASON FOR CALL**this is important as we prioritize the call backs  YOU WILL RECEIVE A CALL BACK THE SAME DAY AS LONG AS YOU CALL BEFORE 4:00 PM

## 2023-08-12 NOTE — Progress Notes (Signed)
 [facility] Note    PCP: Dorothey Baseman, MD (last seen 02/25) Cardiologist: Dorothyann Peng, MD (last seen 11/24)  Chief Complaint: shortness of breath  HPI:  Victoria Dalton is a 89 y/o female with a history of T2DM, hyperlipidemia, HTN, CKD, GERD, atrial fibrillation, kidney stone, frequent UTI's, macular degeneration and chronic heart failure.   Admitted 03/08/22 due to SOB on exertion. Found to have new onset AF along with pulmonary edema on CXR. Cardiology consult obtained. Discharged after 4 days. Echo 03/09/22: EF of 50-55% along with moderate LVH and moderate MR.   Was in the ED 02/05/23 due to postmenopausal vaginal bleeding. Transvaginal ultrasound normal. Hemoglobin 14.1.   Admitted 03/25/23 with acute fever / chills along with increased fatigue and SOB. Found to be hypoxic with sats in the 80's. Also had dysuria and urinary frequency/ urgency. Had recently been treated for UTI. Started antibiotics for sepsis.   She presents today for a HF follow-up visit with a chief complaint of moderate shortness of breath with exertion. Has associated fatigue and right hip/ groin pain along with this. Denies chest pain, cough, palpitations, abdominal distention, pedal edema, dizziness or difficulty sleeping. Is now taking furosemide 40mg  every other day and is concerned about her potassium level as she's not taking any potassium.   ROS: All systems negative except what is listed in HPI, PMH and Problem List   Past Medical History:  Diagnosis Date   Atrial fibrillation (HCC)    CHF (congestive heart failure) (HCC)    Diabetes mellitus without complication (HCC)    Dyspnea    Gallbladder calculus    GERD (gastroesophageal reflux disease)    Hyperlipidemia    Hypertension    Hypertensive retinopathy    OU   Kidney stone    Macular degeneration    Dry OU   Mass    Parotid swelling    UTI (lower urinary tract infection)     Current Outpatient Medications   Medication Sig Dispense Refill   acetaminophen (TYLENOL) 500 MG tablet Take 500 mg by mouth every 6 (six) hours as needed for mild pain.     apixaban (ELIQUIS) 5 MG TABS tablet Take 1 tablet (5 mg total) by mouth 2 (two) times daily. 60 tablet 0   carboxymethylcellulose (REFRESH PLUS) 0.5 % SOLN 1 drop 3 (three) times daily as needed.     diltiazem (CARDIZEM CD) 240 MG 24 hr capsule Take 1 capsule (240 mg total) by mouth daily. 90 capsule 3   furosemide (LASIX) 40 MG tablet TAKE 1 TABLET(40 MG) BY MOUTH DAILY 90 tablet 3   glimepiride (AMARYL) 4 MG tablet TAKE 1 TABLET(4 MG) BY MOUTH DAILY BEFORE BREAKFAST 90 tablet 1   glucose blood (ONETOUCH ULTRA) test strip USE AS DIRECTED 100 strip 3   losartan (COZAAR) 25 MG tablet Take 1 tablet (25 mg total) by mouth daily. HOLD FOR NOW, UNTIL FOLLOW UP WITH PRIMARY CARE     metoprolol succinate (TOPROL-XL) 25 MG 24 hr tablet Take 0.5 tablets (12.5 mg total) by mouth daily. Take with or immediately following a meal. LOWER DOSE - HOLD THE 100 MG DOSE UNTIL FOLLOW UP WITH PRIMARY CARE 30 tablet 0   Multiple Vitamins-Minerals (PRESERVISION AREDS 2 PO) Take by mouth.     pantoprazole (PROTONIX) 40 MG tablet TAKE 1 TABLET(40 MG) BY MOUTH DAILY 90 tablet 3   simvastatin (ZOCOR) 40 MG tablet Take 1 tablet (40 mg total) by mouth daily. 90 tablet 3  No current facility-administered medications for this visit.    Allergies  Allergen Reactions   Chlorhexidine Itching   Ramipril Other (See Comments)    IRREGULAR HEART BEAT   Penicillins Rash    Has patient had a PCN reaction causing immediate rash, facial/tongue/throat swelling, SOB or lightheadedness with hypotension: Yes Has patient had a PCN reaction causing severe rash involving mucus membranes or skin necrosis: Yes Has patient had a PCN reaction that required hospitalization No Has patient had a PCN reaction occurring within the last 10 years: No If all of the above answers are "NO", then may proceed  with Cephalosporin use.    Poison Oak Extract Rash   Sulfa Antibiotics Rash      Social History   Socioeconomic History   Marital status: Widowed    Spouse name: Not on file   Number of children: Not on file   Years of education: Not on file   Highest education level: Not on file  Occupational History   Not on file  Tobacco Use   Smoking status: Never   Smokeless tobacco: Never  Vaping Use   Vaping status: Never Used  Substance and Sexual Activity   Alcohol use: No   Drug use: No   Sexual activity: Not Currently  Other Topics Concern   Not on file  Social History Narrative   Not on file   Social Drivers of Health   Financial Resource Strain: Low Risk  (03/11/2023)   Received from [facility]   Overall Financial Resource Strain (CARDIA)    Difficulty of Paying Living Expenses: Not hard at all  Food Insecurity: No Food Insecurity (03/26/2023)   Hunger Vital Sign    Worried About Running Out of Food in the Last Year: Never true    Ran Out of Food in the Last Year: Never true  Transportation Needs: No Transportation Needs (03/26/2023)   PRAPARE - Administrator, Civil Service (Medical): No    Lack of Transportation (Non-Medical): No  Physical Activity: Inactive (02/21/2021)   Exercise Vital Sign    Days of Exercise per Week: 0 days    Minutes of Exercise per Session: 0 min  Stress: No Stress Concern Present (02/21/2021)   [employer] of Occupational Health - Occupational Stress Questionnaire    Feeling of Stress : Not at all  Social Connections: Socially Isolated (02/21/2021)   Social Connection and Isolation Panel [NHANES]    Frequency of Communication with Friends and Family: More than three times a week    Frequency of Social Gatherings with Friends and Family: More than three times a week    Attends Religious Services: Never    Database administrator or Organizations: No    Attends Banker Meetings: Never     Marital Status: Widowed  Intimate Partner Violence: Not At Risk (03/26/2023)   Humiliation, Afraid, Rape, and Kick questionnaire    Fear of Current or Ex-Partner: No    Emotionally Abused: No    Physically Abused: No    Sexually Abused: No      Family History  Problem Relation Age of Onset   Cancer Father    Heart attack Father    Cancer Brother    Diabetes Brother    Diabetes Son    Breast cancer Neg Hx    Vitals:   08/12/23 1130  BP: 120/69  Pulse: 72  SpO2: 95%  Weight: 245 lb (111.1 kg)   Wt Readings  from Last 3 Encounters:  08/12/23 245 lb (111.1 kg)  03/25/23 241 lb (109.3 kg)  02/12/23 242 lb (109.8 kg)   Lab Results  Component Value Date   CREATININE 0.88 08/12/2023   CREATININE 1.01 (H) 03/26/2023   CREATININE 0.94 03/25/2023    PHYSICAL EXAM:  General: Well appearing. No resp difficulty HEENT: normal Neck: supple, no JVD Cor: Regular rhythm, rate. No rubs, gallops or murmurs Lungs: clear Abdomen: soft, nontender, nondistended. Extremities: no cyanosis, clubbing, rash, edema Neuro: alert & oriented X 3. Moves all 4 extremities w/o difficulty. Affect pleasant  ECG: not done   ASSESSMENT & PLAN:  1: Chronic heart failure with preserved ejection fraction- - suspect due to atrial fibrillation - NYHA class II - euvolemic today - weighing daily; reminded to call for an overnight weight gain of > 2 pounds or a weekly weight gain of > 5 pounds - weight up 3 pounds from last visit here 6 months ago - echo 03/09/22: EF of 50-55% along with moderate LVH and moderate MR.  - not adding salt to her food and has been trying to follow a low sodium diet - continue furosemide 40mg  every other day - losartan has been on hold - continue metoprolol succinate 50mg  daily - BMET today to evaluate if she needs potassium supplementation - has a history of UTI's so not a good candidate for SGLT2 - saw pulmonology Karna Christmas) 02/25 - BNP 03/08/22 was 495.3  2:  HTN- - BP 120/69; will not resume losartan at this time - saw PCP Terance Hart) 02/25 - BMP 06/30/23 reviewed and showed sodium 141, potassium 4.8, creatinine 1.1 & GFR 48 - BMET today  3: DM- - A1c 06/30/23 was 7.3% - continue glimepiride 4mg  daily  4: Atrial fibrillation- - currently rate controlled - continue apixaban 5mg  BID - continue diltiazem 240mg  daily - continue metoprolol 50mg  daily - saw cardiology Juliann Pares) 11/24   Return in 1 month, sooner if needed.   Delma Freeze, FNP 08/12/23

## 2023-09-09 DIAGNOSIS — I130 Hypertensive heart and chronic kidney disease with heart failure and stage 1 through stage 4 chronic kidney disease, or unspecified chronic kidney disease: Secondary | ICD-10-CM | POA: Diagnosis not present

## 2023-09-09 DIAGNOSIS — I4891 Unspecified atrial fibrillation: Secondary | ICD-10-CM | POA: Diagnosis not present

## 2023-09-09 DIAGNOSIS — E785 Hyperlipidemia, unspecified: Secondary | ICD-10-CM | POA: Diagnosis not present

## 2023-09-09 DIAGNOSIS — N182 Chronic kidney disease, stage 2 (mild): Secondary | ICD-10-CM | POA: Diagnosis not present

## 2023-09-09 DIAGNOSIS — M25551 Pain in right hip: Secondary | ICD-10-CM | POA: Diagnosis not present

## 2023-09-09 DIAGNOSIS — I509 Heart failure, unspecified: Secondary | ICD-10-CM | POA: Diagnosis not present

## 2023-09-09 DIAGNOSIS — E1122 Type 2 diabetes mellitus with diabetic chronic kidney disease: Secondary | ICD-10-CM | POA: Diagnosis not present

## 2023-09-15 ENCOUNTER — Telehealth: Payer: Self-pay | Admitting: Family

## 2023-09-15 NOTE — Progress Notes (Unsigned)
 [facility] Note    PCP: Rory Collard, MD (last seen 04/25) Cardiologist: Burney Carter, MD (last seen 11/24)  Chief Complaint: shortness of breath   HPI:  Victoria Dalton is a 89 y/o female with a history of T2DM, hyperlipidemia, HTN, CKD, GERD, atrial fibrillation, kidney stone, frequent UTI's, macular degeneration and chronic heart failure.   Admitted 03/08/22 due to SOB on exertion. Found to have new onset AF along with pulmonary edema on CXR. Cardiology consult obtained. Discharged after 4 days. Echo 03/09/22: EF of 50-55% along with moderate LVH and moderate MR.   Was in the ED 02/05/23 due to postmenopausal vaginal bleeding. Transvaginal ultrasound normal. Hemoglobin 14.1.   Admitted 03/25/23 with acute fever / chills along with increased fatigue and SOB. Found to be hypoxic with sats in the 80's. Also had dysuria and urinary frequency/ urgency. Had recently been treated for UTI. Started antibiotics for sepsis.   She presents today for a HF follow-up visit with a chief complaint of shortness of breath. Has associated fatigue, right hip pain and frequent urination.  Denies chest pain, palpitations, abdominal distention or difficulty sleeping. Would like to discuss decreasing furosemide  due to frequent urination.   ROS: All systems negative except what is listed in HPI, PMH and Problem List   Past Medical History:  Diagnosis Date   Atrial fibrillation (HCC)    CHF (congestive heart failure) (HCC)    Diabetes mellitus without complication (HCC)    Dyspnea    Gallbladder calculus    GERD (gastroesophageal reflux disease)    Hyperlipidemia    Hypertension    Hypertensive retinopathy    OU   Kidney stone    Macular degeneration    Dry OU   Mass    Parotid swelling    UTI (lower urinary tract infection)     Current Outpatient Medications  Medication Sig Dispense Refill   acetaminophen  (TYLENOL ) 500 MG tablet Take 500 mg by mouth every 6 (six)  hours as needed for mild pain.     apixaban  (ELIQUIS ) 5 MG TABS tablet Take 1 tablet (5 mg total) by mouth 2 (two) times daily. 60 tablet 0   carboxymethylcellulose (REFRESH PLUS) 0.5 % SOLN 1 drop 3 (three) times daily as needed.     diltiazem  (CARDIZEM  CD) 240 MG 24 hr capsule Take 1 capsule (240 mg total) by mouth daily. 90 capsule 3   furosemide  (LASIX ) 40 MG tablet Take 1 tablet (40 mg total) by mouth every other day. 45 tablet 3   glimepiride  (AMARYL ) 4 MG tablet TAKE 1 TABLET(4 MG) BY MOUTH DAILY BEFORE BREAKFAST (Patient taking differently: Take 5 mg by mouth daily with breakfast. TAKE 1 TABLET(4 MG) BY MOUTH DAILY BEFORE BREAKFAST Pt reports 5 MG*) 90 tablet 1   glucose blood (ONETOUCH ULTRA) test strip USE AS DIRECTED 100 strip 3   losartan  (COZAAR ) 25 MG tablet Take 1 tablet (25 mg total) by mouth daily. HOLD FOR NOW, UNTIL FOLLOW UP WITH PRIMARY CARE (Patient not taking: Reported on 08/12/2023)     metoprolol  succinate (TOPROL -XL) 50 MG 24 hr tablet Take 50 mg by mouth daily. Take with or immediately following a meal. Take 0.5 tab MG     Multiple Vitamins-Minerals (PRESERVISION AREDS 2 PO) Take by mouth.     pantoprazole  (PROTONIX ) 40 MG tablet TAKE 1 TABLET(40 MG) BY MOUTH DAILY 90 tablet 3   simvastatin  (ZOCOR ) 40 MG tablet Take 1 tablet (40 mg total) by mouth daily. 90 tablet  3   No current facility-administered medications for this visit.    Allergies  Allergen Reactions   Chlorhexidine  Itching   Ramipril Other (See Comments)    IRREGULAR HEART BEAT   Penicillins Rash    Has patient had a PCN reaction causing immediate rash, facial/tongue/throat swelling, SOB or lightheadedness with hypotension: Yes Has patient had a PCN reaction causing severe rash involving mucus membranes or skin necrosis: Yes Has patient had a PCN reaction that required hospitalization No Has patient had a PCN reaction occurring within the last 10 years: No If all of the above answers are "NO", then may  proceed with Cephalosporin use.    Poison Oak Extract Rash   Sulfa Antibiotics Rash      Social History   Socioeconomic History   Marital status: Widowed    Spouse name: Not on file   Number of children: Not on file   Years of education: Not on file   Highest education level: Not on file  Occupational History   Not on file  Tobacco Use   Smoking status: Never   Smokeless tobacco: Never  Vaping Use   Vaping status: Never Used  Substance and Sexual Activity   Alcohol  use: No   Drug use: No   Sexual activity: Not Currently  Other Topics Concern   Not on file  Social History Narrative   Not on file   Social Drivers of Health   Financial Resource Strain: Low Risk  (03/11/2023)   Received from [facility]   Overall Financial Resource Strain (CARDIA)    Difficulty of Paying Living Expenses: Not hard at all  Food Insecurity: No Food Insecurity (03/26/2023)   Hunger Vital Sign    Worried About Running Out of Food in the Last Year: Never true    Ran Out of Food in the Last Year: Never true  Transportation Needs: No Transportation Needs (03/26/2023)   PRAPARE - Administrator, Civil Service (Medical): No    Lack of Transportation (Non-Medical): No  Physical Activity: Inactive (02/21/2021)   Exercise Vital Sign    Days of Exercise per Week: 0 days    Minutes of Exercise per Session: 0 min  Stress: No Stress Concern Present (02/21/2021)   [employer] of Occupational Health - Occupational Stress Questionnaire    Feeling of Stress : Not at all  Social Connections: Socially Isolated (02/21/2021)   Social Connection and Isolation Panel [NHANES]    Frequency of Communication with Friends and Family: More than three times a week    Frequency of Social Gatherings with Friends and Family: More than three times a week    Attends Religious Services: Never    Database administrator or Organizations: No    Attends Banker Meetings: Never     Marital Status: Widowed  Intimate Partner Violence: Not At Risk (03/26/2023)   Humiliation, Afraid, Rape, and Kick questionnaire    Fear of Current or Ex-Partner: No    Emotionally Abused: No    Physically Abused: No    Sexually Abused: No      Family History  Problem Relation Age of Onset   Cancer Father    Heart attack Father    Cancer Brother    Diabetes Brother    Diabetes Son    Breast cancer Neg Hx    Vitals:   09/16/23 1135  BP: 112/69  Pulse: 68  SpO2: 93%  Weight: 244 lb 2 oz (110.7  kg)   Wt Readings from Last 3 Encounters:  09/16/23 244 lb 2 oz (110.7 kg)  08/12/23 245 lb (111.1 kg)  03/25/23 241 lb (109.3 kg)   Lab Results  Component Value Date   CREATININE 0.88 08/12/2023   CREATININE 1.01 (H) 03/26/2023   CREATININE 0.94 03/25/2023    PHYSICAL EXAM:  General: Well appearing. No resp difficulty HEENT: normal Neck: supple, no JVD Cor: Regular rhythm, rate. No rubs, gallops or murmurs Lungs: clear Abdomen: soft, nontender, nondistended. Extremities: no cyanosis, clubbing, rash, edema Neuro: alert & oriented X 3. Moves all 4 extremities w/o difficulty. Affect pleasant   ECG: not done   ASSESSMENT & PLAN:  1: Chronic heart failure with preserved ejection fraction- - suspect due to atrial fibrillation - NYHA class II - euvolemic today - weight stable from last visit here 1 month ago - echo 03/09/22: EF of 50-55% along with moderate LVH and moderate MR.  - updated echo has been scheduled for 11/04/23 - not adding salt to her food and has been trying to follow a low sodium diet - decrease furosemide  to 40mg  on M, W, F; if she finds that on Monday's she's gained weight or has swelling to let us  know as she may need a 20mg  dose over the weekend - losartan  has been on hold - continue metoprolol  succinate 50mg  daily - could consider MRA if home BP's look good; bring BP log to every visit - has a history of UTI's so not a good candidate for  SGLT2 - saw pulmonology Jaclynn Mast) 02/25 - BNP 03/08/22 was 495.3  2: HTN- - BP 112/69 - saw PCP Vila Grayer) 04/25 - BMP 08/12/23 reviewed: sodium 139, potassium 4.0, creatinine 0.88 & GFR >60  3: DM- - A1c 06/30/23 was 7.3% - continue glipizide 5mg  daily  4: Atrial fibrillation- - currently rate controlled - continue apixaban  5mg  BID - continue diltiazem  240mg  daily - continue metoprolol  50mg  daily - saw cardiology Beau Bound) 11/24   Return 1-2 weeks after echo  Charlette Console, FNP 09/15/23

## 2023-09-15 NOTE — Telephone Encounter (Signed)
 Called to confirm/remind patient of their appointment at the [facility] on 09/16/23.   Appointment:   [] Confirmed  [x] Left mess   [] No answer/No voice mail  [] VM Full/unable to leave message  [] Phone not in service  Patient reminded to bring all medications and/or complete list.  Confirmed patient has transportation. Gave directions, instructed to utilize valet parking.

## 2023-09-16 ENCOUNTER — Encounter: Payer: Self-pay | Admitting: Family

## 2023-09-16 ENCOUNTER — Ambulatory Visit: Attending: Family | Admitting: Family

## 2023-09-16 VITALS — BP 112/69 | HR 68 | Wt 244.1 lb

## 2023-09-16 DIAGNOSIS — M25551 Pain in right hip: Secondary | ICD-10-CM | POA: Diagnosis not present

## 2023-09-16 DIAGNOSIS — Z87440 Personal history of urinary (tract) infections: Secondary | ICD-10-CM | POA: Insufficient documentation

## 2023-09-16 DIAGNOSIS — I5032 Chronic diastolic (congestive) heart failure: Secondary | ICD-10-CM | POA: Insufficient documentation

## 2023-09-16 DIAGNOSIS — Z8249 Family history of ischemic heart disease and other diseases of the circulatory system: Secondary | ICD-10-CM | POA: Insufficient documentation

## 2023-09-16 DIAGNOSIS — E785 Hyperlipidemia, unspecified: Secondary | ICD-10-CM | POA: Diagnosis not present

## 2023-09-16 DIAGNOSIS — I130 Hypertensive heart and chronic kidney disease with heart failure and stage 1 through stage 4 chronic kidney disease, or unspecified chronic kidney disease: Secondary | ICD-10-CM | POA: Diagnosis not present

## 2023-09-16 DIAGNOSIS — Z833 Family history of diabetes mellitus: Secondary | ICD-10-CM | POA: Insufficient documentation

## 2023-09-16 DIAGNOSIS — Z7984 Long term (current) use of oral hypoglycemic drugs: Secondary | ICD-10-CM | POA: Diagnosis not present

## 2023-09-16 DIAGNOSIS — Z87442 Personal history of urinary calculi: Secondary | ICD-10-CM | POA: Diagnosis not present

## 2023-09-16 DIAGNOSIS — R350 Frequency of micturition: Secondary | ICD-10-CM | POA: Diagnosis not present

## 2023-09-16 DIAGNOSIS — N189 Chronic kidney disease, unspecified: Secondary | ICD-10-CM | POA: Insufficient documentation

## 2023-09-16 DIAGNOSIS — N182 Chronic kidney disease, stage 2 (mild): Secondary | ICD-10-CM

## 2023-09-16 DIAGNOSIS — I480 Paroxysmal atrial fibrillation: Secondary | ICD-10-CM

## 2023-09-16 DIAGNOSIS — I10 Essential (primary) hypertension: Secondary | ICD-10-CM | POA: Diagnosis not present

## 2023-09-16 DIAGNOSIS — Z79899 Other long term (current) drug therapy: Secondary | ICD-10-CM | POA: Insufficient documentation

## 2023-09-16 DIAGNOSIS — Z7901 Long term (current) use of anticoagulants: Secondary | ICD-10-CM | POA: Insufficient documentation

## 2023-09-16 DIAGNOSIS — I4891 Unspecified atrial fibrillation: Secondary | ICD-10-CM | POA: Diagnosis not present

## 2023-09-16 DIAGNOSIS — E1122 Type 2 diabetes mellitus with diabetic chronic kidney disease: Secondary | ICD-10-CM | POA: Diagnosis not present

## 2023-09-16 MED ORDER — FUROSEMIDE 40 MG PO TABS: 40.0000 mg | ORAL_TABLET | ORAL | 3 refills | Status: AC

## 2023-09-16 NOTE — Patient Instructions (Signed)
 Medication Changes:  CHANGE Furosemide  40mg  every Monday, Wednesday, and Friday    Testing/Procedures: Your physician has requested that you have an echocardiogram. Echocardiography is a painless test that uses sound waves to create images of your heart. It provides your doctor with information about the size and shape of your heart and how well your heart's chambers and valves are working. This procedure takes approximately one hour. There are no restrictions for this procedure. Please do NOT wear cologne, perfume, aftershave, or lotions (deodorant is allowed). Please arrive 15 minutes prior to your appointment time.  Please note: We ask at that you not bring children with you during ultrasound (echo/ vascular) testing. Due to room size and safety concerns, children are not allowed in the ultrasound rooms during exams. Our front office staff cannot provide observation of children in our lobby area while testing is being conducted. An adult accompanying a patient to their appointment will only be allowed in the ultrasound room at the discretion of the ultrasound technician under special circumstances. We apologize for any inconvenience.  Please have your echo completed. You will check in for this at the [facility]. You have to arrive 15 MINS EARLY for preparation, otherwise you will have to reschedule.   Follow-Up in: Please follow up with the [facility] in June with an echocardiogram.   At the [facility], you and your health needs are our priority. We have a designated team specialized in the treatment of Heart Failure. This Care Team includes your primary Heart Failure Specialized Cardiologist (physician), Advanced Practice Providers (APPs- Physician Assistants and Nurse Practitioners), and Pharmacist who all work together to provide you with the care you need, when you need it.   You may see any of the following providers on your designated Care Team at  your next follow up:  Dr. Jules Oar Dr. Peder Bourdon Dr. Alwin Baars Dr. Judyth Nunnery Shawnee Dellen, FNP Bevely Brush, RPH-CPP  Please be sure to bring in all your medications bottles to every appointment.   Need to Contact Us :  If you have any questions or concerns before your next appointment please send us  a message through [place] or call our office at [phone].    TO LEAVE A MESSAGE FOR THE NURSE SELECT OPTION 2, PLEASE LEAVE A MESSAGE INCLUDING: YOUR NAME DATE OF BIRTH CALL BACK NUMBER REASON FOR CALL**this is important as we prioritize the call backs  YOU WILL RECEIVE A CALL BACK THE SAME DAY AS LONG AS YOU CALL BEFORE 4:00 PM

## 2023-10-14 DIAGNOSIS — R109 Unspecified abdominal pain: Secondary | ICD-10-CM | POA: Diagnosis not present

## 2023-10-14 DIAGNOSIS — I5032 Chronic diastolic (congestive) heart failure: Secondary | ICD-10-CM | POA: Diagnosis not present

## 2023-10-14 DIAGNOSIS — E785 Hyperlipidemia, unspecified: Secondary | ICD-10-CM | POA: Diagnosis not present

## 2023-10-14 DIAGNOSIS — I4891 Unspecified atrial fibrillation: Secondary | ICD-10-CM | POA: Diagnosis not present

## 2023-10-14 DIAGNOSIS — E119 Type 2 diabetes mellitus without complications: Secondary | ICD-10-CM | POA: Diagnosis not present

## 2023-10-14 DIAGNOSIS — K219 Gastro-esophageal reflux disease without esophagitis: Secondary | ICD-10-CM | POA: Diagnosis not present

## 2023-10-14 DIAGNOSIS — R0602 Shortness of breath: Secondary | ICD-10-CM | POA: Diagnosis not present

## 2023-10-14 DIAGNOSIS — R197 Diarrhea, unspecified: Secondary | ICD-10-CM | POA: Diagnosis not present

## 2023-10-14 DIAGNOSIS — I10 Essential (primary) hypertension: Secondary | ICD-10-CM | POA: Diagnosis not present

## 2023-10-14 DIAGNOSIS — G4733 Obstructive sleep apnea (adult) (pediatric): Secondary | ICD-10-CM | POA: Diagnosis not present

## 2023-10-14 DIAGNOSIS — N183 Chronic kidney disease, stage 3 unspecified: Secondary | ICD-10-CM | POA: Diagnosis not present

## 2023-10-29 DIAGNOSIS — I509 Heart failure, unspecified: Secondary | ICD-10-CM | POA: Diagnosis not present

## 2023-10-29 DIAGNOSIS — N390 Urinary tract infection, site not specified: Secondary | ICD-10-CM | POA: Diagnosis not present

## 2023-11-04 ENCOUNTER — Ambulatory Visit
Admission: RE | Admit: 2023-11-04 | Discharge: 2023-11-04 | Disposition: A | Discharge status: Home / Self Care | Source: Ambulatory Visit | Attending: Family | Admitting: Family

## 2023-11-04 DIAGNOSIS — I5032 Chronic diastolic (congestive) heart failure: Secondary | ICD-10-CM | POA: Insufficient documentation

## 2023-11-04 DIAGNOSIS — E119 Type 2 diabetes mellitus without complications: Secondary | ICD-10-CM | POA: Insufficient documentation

## 2023-11-04 DIAGNOSIS — I110 Hypertensive heart disease with heart failure: Secondary | ICD-10-CM | POA: Diagnosis not present

## 2023-11-04 LAB — ECHOCARDIOGRAM COMPLETE
AR max vel: 2.12 cm2
AV Area VTI: 2.28 cm2
AV Area mean vel: 2.03 cm2
AV Mean grad: 4 mmHg
AV Peak grad: 6.9 mmHg
Ao pk vel: 1.32 m/s
Area-P 1/2: 2.77 cm2
S' Lateral: 2.4 cm

## 2023-11-04 NOTE — Progress Notes (Signed)
*  PRELIMINARY RESULTS* Echocardiogram 2D Echocardiogram has been performed.  Victoria Dalton 11/04/2023, 11:59 AM

## 2023-11-05 ENCOUNTER — Ambulatory Visit: Payer: Self-pay | Admitting: Family

## 2023-11-18 ENCOUNTER — Encounter: Admitting: Family

## 2023-12-28 NOTE — Progress Notes (Signed)
 [facility] - Clinic Note  01/11/2024     CHIEF COMPLAINT Patient presents for Retina Follow Up    HISTORY OF PRESENT ILLNESS: Victoria Dalton is a 89 y.o. female who presents to the clinic today for:  HPI     Retina Follow Up   Patient presents with  Dry AMD.  In both eyes.  This started 9 months ago.  I, the attending physician,  performed the HPI with the patient and updated documentation appropriately.        Comments   Patient here for 9 months retina follow up for non exu ARMD OU. Patient states vision doing good.  OS wants to water little bit. No eye pain. Gets residue in corner of OS. Uses tears. No eye pain.       Last edited by Valdemar Rogue, MD on 01/16/2024  2:48 AM.      Pt states vision is stable, reports taking AREDs but hasn't been using the Amsler grid. She's uncertain what she's looking at.   Referring physician: Helene Deacon, MD [address] [place], [place] [address]  HISTORICAL INFORMATION:   Selected notes from the MEDICAL RECORD NUMBER Referred by Dr. Deacon LEE: 02/22/2020 BCVA: OD 20/70, OS 20/70 Ocular Hx- Mac cyst OU, Cataracts OU, Band Keratopathy OU, ARMD OU PMH- DM   CURRENT MEDICATIONS: Current Outpatient Medications (Ophthalmic Drugs)  Medication Sig   carboxymethylcellulose (REFRESH PLUS) 0.5 % SOLN 1 drop 3 (three) times daily as needed.   No current facility-administered medications for this visit. (Ophthalmic Drugs)   Current Outpatient Medications (Other)  Medication Sig   acetaminophen  (TYLENOL ) 500 MG tablet Take 500 mg by mouth every 6 (six) hours as needed for mild pain.   apixaban  (ELIQUIS ) 5 MG TABS tablet Take 1 tablet (5 mg total) by mouth 2 (two) times daily.   diltiazem  (CARDIZEM  CD) 240 MG 24 hr capsule Take 1 capsule (240 mg total) by mouth daily.   furosemide  (LASIX ) 40 MG tablet Take 1 tablet (40 mg total) by mouth every Monday, Wednesday, and Friday.   glipiZIDE (GLUCOTROL) 5  MG tablet Take 5 mg by mouth daily.   metoprolol  succinate (TOPROL -XL) 50 MG 24 hr tablet Take 50 mg by mouth daily. Take with or immediately following a meal.   Multiple Vitamins-Minerals (PRESERVISION AREDS 2 PO) Take by mouth.   pantoprazole  (PROTONIX ) 40 MG tablet TAKE 1 TABLET(40 MG) BY MOUTH DAILY   simvastatin  (ZOCOR ) 40 MG tablet Take 1 tablet (40 mg total) by mouth daily.   No current facility-administered medications for this visit. (Other)   REVIEW OF SYSTEMS: ROS   Positive for: Gastrointestinal, Endocrine, Eyes Negative for: Constitutional, Neurological, Skin, Genitourinary, Musculoskeletal, HENT, Cardiovascular, Respiratory, Psychiatric, Allergic/Imm, Heme/Lymph Last edited by Orval Asberry RAMAN, COA on 01/11/2024  1:04 PM.       ALLERGIES Allergies  Allergen Reactions   Chlorhexidine  Itching   Ramipril Other (See Comments)    IRREGULAR HEART BEAT   Penicillins Rash    Has patient had a PCN reaction causing immediate rash, facial/tongue/throat swelling, SOB or lightheadedness with hypotension: Yes Has patient had a PCN reaction causing severe rash involving mucus membranes or skin necrosis: Yes Has patient had a PCN reaction that required hospitalization No Has patient had a PCN reaction occurring within the last 10 years: No If all of the above answers are NO, then may proceed with Cephalosporin use.    Poison Oak Extract Rash   Sulfa  Antibiotics Rash   PAST MEDICAL HISTORY Past Medical History:  Diagnosis Date   Atrial fibrillation (HCC)    CHF (congestive heart failure) (HCC)    Diabetes mellitus without complication (HCC)    Dyspnea    Gallbladder calculus    GERD (gastroesophageal reflux disease)    Hyperlipidemia    Hypertension    Hypertensive retinopathy    OU   Kidney stone    Macular degeneration    Dry OU   Mass    Parotid swelling    UTI (lower urinary tract infection)    Past Surgical History:  Procedure Laterality Date   CATARACT  EXTRACTION Bilateral 02/2020   Dr. Helene Deacon   CHOLECYSTECTOMY N/A 04/30/2016   Procedure: CHOLECYSTECTOMY;  Surgeon: Laneta JULIANNA Luna, MD;  Location: [facility] ORS;  Service: General;  Laterality: N/A;   DIAGNOSTIC LAPAROSCOPIC LIVER BIOPSY  02/28/2016   Procedure: DIAGNOSTIC LAPAROSCOPIC LIVER BIOPSY;  Surgeon: Laneta JULIANNA Luna, MD;  Location: [facility] ORS;  Service: General;;   ERCP N/A 01/13/2016   Procedure: ENDOSCOPIC RETROGRADE CHOLANGIOPANCREATOGRAPHY (ERCP);  Surgeon: Rogelia Copping, MD;  Location: [facility] END[facility]COPY;  Service: Endoscopy;  Laterality: N/A;   ERCP N/A 04/08/2016   Procedure: ENDOSCOPIC RETROGRADE CHOLANGIOPANCREATOGRAPHY (ERCP) Stent removal;  Surgeon: Rogelia Copping, MD;  Location: [facility] ENDOSCOPY;  Service: Endoscopy;  Laterality: N/A;   EYE SURGERY Bilateral 02/2020   Cat Sx - Dr. Helene Deacon   INTRAOPERATIVE CHOLANGIOGRAM  04/30/2016   Procedure: INTRAOPERATIVE CHOLANGIOGRAM;  Surgeon: Laneta JULIANNA Luna, MD;  Location: [facility] ORS;  Service: General;;   KNEE ARTHROSCOPY Left    LAPAROSCOPY  02/28/2016   Procedure: Diagnotic laparoscopy with omental biopsy;  Surgeon: Laneta JULIANNA Luna, MD;  Location: [facility] ORS;  Service: General;;   TONSILLECTOMY     FAMILY HISTORY Family History  Problem Relation Age of Onset   Cancer Father    Heart attack Father    Cancer Brother    Diabetes Brother    Diabetes Son    Breast cancer Neg Hx    SOCIAL HISTORY Social History   Tobacco Use   Smoking status: Never   Smokeless tobacco: Never   Tobacco comments:    Never smoked 01/08/24  Vaping Use   Vaping status: Never Used  Substance Use Topics   Alcohol  use: No   Drug use: No       OPHTHALMIC EXAM:  Base Eye Exam     Visual Acuity (Snellen - Linear)       Right Left   Dist cc 20/40 -1 20/50 -2   Dist ph cc NI 20/40    Correction: Glasses         Tonometry (Tonopen, 1:01 PM)       Right Left   Pressure 15 17         Pupils       Dark Light Shape React APD   Right 3 2  Round Brisk None   Left 3 2 Round Brisk None         Visual Fields (Counting fingers)       Left Right    Full Full         Extraocular Movement       Right Left    Full, Ortho Full, Ortho         Neuro/Psych     Oriented x3: Yes   Mood/Affect: Normal         Dilation     Both eyes: 1.0% Mydriacyl, 2.5%  Phenylephrine  @ 1:01 PM           Slit Lamp and Fundus Exam     Slit Lamp Exam       Right Left   Lids/Lashes Dermatochalasis - upper lid, Meibomian gland dysfunction - improved Dermatochalasis   Conjunctiva/Sclera White and quiet White and quiet   Cornea early band K nasal and temporal extending centrally w/ corneal haze, 2+ PEE centrally, arcus, well healed temporal cataract wounds, tear film debris early band K nasal and temporal extending centrally w/ corneal haze, 2+PEE, arcus, well healed temporal cataract wounds   Anterior Chamber deep and clear deep and clear   Iris Round and dilated Round and dilated   Lens PC IOL in good position, 1-2+ Posterior capsular opacification PC IOL in good position, 1+ Posterior capsular opacification   Anterior Vitreous Vitreous syneresis, Posterior vitreous detachment Vitreous syneresis, Posterior vitreous detachment         Fundus Exam       Right Left   Disc mild pallor, Sharp rim, +Peripapillary atrophy Pink and Sharp, temporal Peripapillary atrophy   C/D Ratio 0.4 0.5   Macula Flat, Blunted foveal reflex, RPE mottling, clumping and atrophy centrally, No heme or edema, mild vitelliform like lesion, fine drusen Flat, Blunted foveal reflex, RPE mottling, clumping and atrophy greatest centrally, fine drusen, No heme or edema   Vessels attenuated, Tortuous attenuated, Tortuous   Periphery Attached, reticular degeneration, No heme Attached, reticular degeneration, No heme           Refraction     Wearing Rx       Sphere Cylinder Axis Add   Right -1.00 +1.25 168 +3.00   Left -1.00 +1.00 180 +3.00            IMAGING AND PROCEDURES  Imaging and Procedures for 01/11/2024  OCT, Retina - OU - Both Eyes       Right Eye Quality was good. Central Foveal Thickness: 234. Progression has been stable. Findings include normal foveal contour, no IRF, no SRF, retinal drusen , subretinal hyper-reflective material, outer retinal atrophy (persistent focal central SRHM -- vitelliform like lesion; no central SRF, patchy ORA centrally).   Left Eye Quality was good. Central Foveal Thickness: 241. Progression has been stable. Findings include normal foveal contour, no IRF, no SRF, subretinal hyper-reflective material, outer retinal atrophy (focal central SRHM; no SRF, patchy ORA centrally).   Notes *Images captured and stored on drive  Diagnosis / Impression:  NFP, no IRF/SRF OU OD: persistent focal central SRHM -- vitelliform-like lesion; patchy ORA OS: focal central SRHM; patchy ORA  Clinical management:  See below  Abbreviations: NFP - Normal foveal profile. CME - cystoid macular edema. PED - pigment epithelial detachment. IRF - intraretinal fluid. SRF - subretinal fluid. EZ - ellipsoid zone. ERM - epiretinal membrane. ORA - outer retinal atrophy. ORT - outer retinal tubulation. SRHM - subretinal hyper-reflective material. IRHM - intraretinal hyper-reflective material             ASSESSMENT/PLAN:    ICD-10-CM   1. Intermediate stage nonexudative age-related macular degeneration of both eyes  H35.3132 OCT, Retina - OU - Both Eyes    2. Diabetes mellitus type 2 without retinopathy (HCC)  E11.9     3. Essential hypertension  I10     4. Hypertensive retinopathy of both eyes  H35.033     5. Pseudophakia of both eyes  Z96.1     6. Band keratopathy of both eyes  H18.423  1. Age related macular degeneration, non-exudative, both eyes  - The incidence, anatomy, and pathology of dry AMD, risk of progression, and the AREDS and AREDS 2 studies including smoking risks discussed with  patient.  - BCVA OD 20/40 from 20/30; OS: 20/40 from 20/50  - OCT shows OD: persistent focal central SRHM -- vitelliform-like lesion; patchy ORA; OS: focal central SRHM; patchy ORA  - Continue Amsler Grid monitoring-reviewed instructions w/ patient.   - f/u 9 months, DFE, OCT  2. Diabetes mellitus, type 2 without retinopathy  -A1C 7.3 on 02.04.25 - The incidence, risk factors for progression, natural history and treatment options for diabetic retinopathy  were discussed with patient.   - The need for close monitoring of blood glucose, blood pressure, and serum lipids, avoiding cigarette or any type of tobacco, and the need for long term follow up was also discussed with patient. - f/u in 9 months, sooner prn  3,4. Hypertensive retinopathy OU - discussed importance of tight BP control - monitor  5. Pseudophakia OU  - s/p CE/IOL w/Dr. Meridee [phone]  - IOL in good position, doing well - monitor  6. Band keratopathy OU  - cont ATs and lubricating gel  - will refer back to Dr. Meridee to reestablish care.   Ophthalmic Meds Ordered this visit:  No orders of the defined types were placed in this encounter.    Return in about 9 months (around 10/10/2024) for non exu ARMD OU, DFE, OCT.  There are no Patient Instructions on file for this visit.  This document serves as a record of services personally performed by Redell JUDITHANN Hans, MD, PhD. It was created on their behalf by Avelina Pereyra, COA an ophthalmic technician. The creation of this record is the provider's dictation and/or activities during the visit.   Electronically signed by: Avelina GORMAN Pereyra, COT  01/16/24  2:55 AM   This document serves as a record of services personally performed by Redell JUDITHANN Hans, MD, PhD. It was created on their behalf by Almetta Pesa, an ophthalmic technician. The creation of this record is the provider's dictation and/or activities during the visit.    Electronically signed by: Almetta Pesa, OA, 01/16/24  2:55 AM  Redell JUDITHANN Hans, M.D., Ph.D. Diseases & Surgery of the Retina and Vitreous [facility]  I have reviewed the above documentation for accuracy and completeness, and I agree with the above. Redell JUDITHANN Hans, M.D., Ph.D. 01/16/24 2:56 AM   Abbreviations: M myopia (nearsighted); A astigmatism; H hyperopia (farsighted); P presbyopia; Mrx spectacle prescription;  CTL contact lenses; OD right eye; OS left eye; OU both eyes  XT exotropia; ET esotropia; PEK punctate epithelial keratitis; PEE punctate epithelial erosions; DES dry eye syndrome; MGD meibomian gland dysfunction; ATs artificial tears; PFAT's preservative free artificial tears; NSC nuclear sclerotic cataract; PSC posterior subcapsular cataract; ERM epi-retinal membrane; PVD posterior vitreous detachment; RD retinal detachment; DM diabetes mellitus; DR diabetic retinopathy; NPDR non-proliferative diabetic retinopathy; PDR proliferative diabetic retinopathy; CSME clinically significant macular edema; DME diabetic macular edema; dbh dot blot hemorrhages; CWS cotton wool spot; POAG primary open angle glaucoma; C/D cup-to-disc ratio; HVF humphrey visual field; GVF goldmann visual field; OCT optical coherence tomography; IOP intraocular pressure; BRVO Branch retinal vein occlusion; CRVO central retinal vein occlusion; CRAO central retinal artery occlusion; BRAO branch retinal artery occlusion; RT retinal tear; SB scleral buckle; PPV pars plana vitrectomy; VH Vitreous hemorrhage; PRP panretinal laser photocoagulation; IVK intravitreal kenalog ; VMT vitreomacular traction; MH Macular  hole;  NVD neovascularization of the disc; NVE neovascularization elsewhere; AREDS age related eye disease study; ARMD age related macular degeneration; POAG primary open angle glaucoma; EBMD epithelial/anterior basement membrane dystrophy; ACIOL anterior chamber intraocular lens; IOL intraocular lens; PCIOL posterior chamber  intraocular lens; Phaco/IOL phacoemulsification with intraocular lens placement; PRK photorefractive keratectomy; LASIK laser assisted in situ keratomileusis; HTN hypertension; DM diabetes mellitus; COPD chronic obstructive pulmonary disease

## 2024-01-04 DIAGNOSIS — R0609 Other forms of dyspnea: Secondary | ICD-10-CM | POA: Diagnosis not present

## 2024-01-04 DIAGNOSIS — J9811 Atelectasis: Secondary | ICD-10-CM | POA: Diagnosis not present

## 2024-01-07 ENCOUNTER — Telehealth: Payer: Self-pay | Admitting: Family

## 2024-01-07 NOTE — Progress Notes (Signed)
 Advanced Heart Failure Clinic Note    PCP: Glover Lenis, MD  Cardiologist: Florencio Kava, MD (last seen 11/24; returns 11/25)  Chief Complaint: shortness of breath   HPI:  Victoria Dalton is a 89 y/o female with a history of T2DM, hyperlipidemia, HTN, CKD, GERD, atrial fibrillation, kidney stone, frequent UTI's, macular degeneration and chronic heart failure.   Admitted 03/08/22 due to SOB on exertion. Found to have new onset AF along with pulmonary edema on CXR. Cardiology consult obtained. Discharged after 4 days. Echo 03/09/22: EF of 50-55% along with moderate LVH and moderate MR.   Was in the ED 02/05/23 due to postmenopausal vaginal bleeding. Transvaginal ultrasound normal. Hemoglobin 14.1.   Admitted 03/25/23 with acute fever / chills along with increased fatigue and SOB. Found to be hypoxic with sats in the 80's. Also had dysuria and urinary frequency/ urgency. Had recently been treated for UTI. Started antibiotics for sepsis.   Echo 11/04/23: EF 60-65%, G1DD, normal RV, mild MR  She presents today, with her daughter, for a HF follow-up visit with a chief complaint of shortness of breath. Has associated fatigue, knee weakness. Denies chest pain, palpitations, abdominal distention, pedal edema or dizziness. Overall she says that she's feeling well.   ROS: All systems negative except what is listed in HPI, PMH and Problem List   Past Medical History:  Diagnosis Date   Atrial fibrillation (HCC)    CHF (congestive heart failure) (HCC)    Diabetes mellitus without complication (HCC)    Dyspnea    Gallbladder calculus    GERD (gastroesophageal reflux disease)    Hyperlipidemia    Hypertension    Hypertensive retinopathy    OU   Kidney stone    Macular degeneration    Dry OU   Mass    Parotid swelling    UTI (lower urinary tract infection)     Current Outpatient Medications  Medication Sig Dispense Refill   acetaminophen  (TYLENOL ) 500 MG tablet Take 500 mg by  mouth every 6 (six) hours as needed for mild pain.     apixaban  (ELIQUIS ) 5 MG TABS tablet Take 1 tablet (5 mg total) by mouth 2 (two) times daily. 60 tablet 0   carboxymethylcellulose (REFRESH PLUS) 0.5 % SOLN 1 drop 3 (three) times daily as needed.     diltiazem  (CARDIZEM  CD) 240 MG 24 hr capsule Take 1 capsule (240 mg total) by mouth daily. 90 capsule 3   furosemide  (LASIX ) 40 MG tablet Take 1 tablet (40 mg total) by mouth every Monday, Wednesday, and Friday. 45 tablet 3   glipiZIDE (GLUCOTROL) 5 MG tablet Take 5 mg by mouth daily.     glucose blood (ONETOUCH ULTRA) test strip USE AS DIRECTED 100 strip 3   metoprolol  succinate (TOPROL -XL) 50 MG 24 hr tablet Take 50 mg by mouth daily. Take with or immediately following a meal.     Multiple Vitamins-Minerals (PRESERVISION AREDS 2 PO) Take by mouth.     pantoprazole  (PROTONIX ) 40 MG tablet TAKE 1 TABLET(40 MG) BY MOUTH DAILY 90 tablet 3   simvastatin  (ZOCOR ) 40 MG tablet Take 1 tablet (40 mg total) by mouth daily. 90 tablet 3   No current facility-administered medications for this visit.    Allergies  Allergen Reactions   Chlorhexidine  Itching   Ramipril Other (See Comments)    IRREGULAR HEART BEAT   Penicillins Rash    Has patient had a PCN reaction causing immediate rash, facial/tongue/throat swelling, SOB or lightheadedness with hypotension: Yes  Has patient had a PCN reaction causing severe rash involving mucus membranes or skin necrosis: Yes Has patient had a PCN reaction that required hospitalization No Has patient had a PCN reaction occurring within the last 10 years: No If all of the above answers are NO, then may proceed with Cephalosporin use.    Poison Oak Extract Rash   Sulfa Antibiotics Rash      Social History   Socioeconomic History   Marital status: Widowed    Spouse name: Not on file   Number of children: Not on file   Years of education: Not on file   Highest education level: Not on file  Occupational  History   Not on file  Tobacco Use   Smoking status: Never   Smokeless tobacco: Never  Vaping Use   Vaping status: Never Used  Substance and Sexual Activity   Alcohol  use: No   Drug use: No   Sexual activity: Not Currently  Other Topics Concern   Not on file  Social History Narrative   Not on file   Social Drivers of Health   Financial Resource Strain: Low Risk  (03/11/2023)   Received from [facility]   Overall Financial Resource Strain (CARDIA)    Difficulty of Paying Living Expenses: Not hard at all  Food Insecurity: No Food Insecurity (03/26/2023)   Hunger Vital Sign    Worried About Running Out of Food in the Last Year: Never true    Ran Out of Food in the Last Year: Never true  Transportation Needs: No Transportation Needs (03/26/2023)   PRAPARE - Administrator, Civil Service (Medical): No    Lack of Transportation (Non-Medical): No  Physical Activity: Inactive (02/21/2021)   Exercise Vital Sign    Days of Exercise per Week: 0 days    Minutes of Exercise per Session: 0 min  Stress: No Stress Concern Present (02/21/2021)   [employer] of Occupational Health - Occupational Stress Questionnaire    Feeling of Stress : Not at all  Social Connections: Socially Isolated (02/21/2021)   Social Connection and Isolation Panel    Frequency of Communication with Friends and Family: More than three times a week    Frequency of Social Gatherings with Friends and Family: More than three times a week    Attends Religious Services: Never    Database administrator or Organizations: No    Attends Banker Meetings: Never    Marital Status: Widowed  Intimate Partner Violence: Not At Risk (03/26/2023)   Humiliation, Afraid, Rape, and Kick questionnaire    Fear of Current or Ex-Partner: No    Emotionally Abused: No    Physically Abused: No    Sexually Abused: No      Family History  Problem Relation Age of Onset   Cancer Father     Heart attack Father    Cancer Brother    Diabetes Brother    Diabetes Son    Breast cancer Neg Hx    Vitals:   01/08/24 1105  BP: 98/68  Pulse: 62  SpO2: 93%  Weight: 244 lb (110.7 kg)  Height: 5' 9 (1.753 m)   Wt Readings from Last 3 Encounters:  01/08/24 244 lb (110.7 kg)  09/16/23 244 lb 2 oz (110.7 kg)  08/12/23 245 lb (111.1 kg)   Lab Results  Component Value Date   CREATININE 1.11 (H) 01/08/2024   CREATININE 0.88 08/12/2023   CREATININE 1.01 (H) 03/26/2023  PHYSICAL EXAM:  General: Well appearing elderly female in wheelchair.  Cor: No JVD. Regular rhythm, rate.  Lungs: clear Abdomen: soft, nontender, nondistended. Extremities: no edema Neuro:. Affect pleasant    ECG: not done   ASSESSMENT & PLAN:  1: Chronic heart failure with preserved ejection fraction- - suspect due to atrial fibrillation - NYHA class II - euvolemic today - weight stable from last visit here 4 months ago - echo 03/09/22: EF of 50-55% along with moderate LVH and moderate MR.  - Echo 11/04/23: EF 60-65%, G1DD, normal RV, mild MR - not adding salt to her food and has been trying to follow a low sodium diet - continue furosemide  40mg  on M, W, F although she has to go home and double check dosage - continue metoprolol  succinate 50mg  daily - current BP will not tolerate MRA - has a history of UTI's so not a good candidate for SGLT2 - saw pulmonology Burnie) 08/25 - BNP 03/08/22 was 495.3  2: HTN- - BP 98/68 - saw PCP Loree) 06/25 - BMP 08/12/23 reviewed: sodium 139, potassium 4.0, creatinine 0.88 & GFR >60 - BMET today  3: DM- - A1c 06/30/23 was 7.3% - continue glipizide 5mg  daily - managed by PCP  4: Atrial fibrillation- - currently rate controlled - continue apixaban  5mg  BID - continue diltiazem  240mg  daily - continue metoprolol  50mg  daily - saw cardiology Philippe) 11/24; returns 11/25   Return in 6 months, sooner if needed.  Ellouise DELENA Class, FNP 01/07/24

## 2024-01-07 NOTE — Telephone Encounter (Signed)
 Called to confirm/remind patient of their appointment at the Advanced Heart Failure Clinic on 01/08/24.   Appointment:   [x] Confirmed  [] Left mess   [] No answer/No voice mail  [] VM Full/unable to leave message  [] Phone not in service  Patient reminded to bring all medications and/or complete list.  Confirmed patient has transportation. Gave directions, instructed to utilize valet parking.

## 2024-01-08 ENCOUNTER — Other Ambulatory Visit
Admission: RE | Admit: 2024-01-08 | Discharge: 2024-01-08 | Disposition: A | Discharge status: Home / Self Care | Source: Ambulatory Visit | Attending: Family | Admitting: Family

## 2024-01-08 ENCOUNTER — Encounter: Payer: Self-pay | Admitting: Family

## 2024-01-08 ENCOUNTER — Ambulatory Visit (HOSPITAL_BASED_OUTPATIENT_CLINIC_OR_DEPARTMENT_OTHER): Admitting: Family

## 2024-01-08 ENCOUNTER — Ambulatory Visit: Payer: Self-pay | Admitting: Family

## 2024-01-08 VITALS — BP 98/68 | HR 62 | Ht 69.0 in | Wt 244.0 lb

## 2024-01-08 DIAGNOSIS — I5032 Chronic diastolic (congestive) heart failure: Secondary | ICD-10-CM

## 2024-01-08 DIAGNOSIS — N182 Chronic kidney disease, stage 2 (mild): Secondary | ICD-10-CM | POA: Diagnosis not present

## 2024-01-08 DIAGNOSIS — E1122 Type 2 diabetes mellitus with diabetic chronic kidney disease: Secondary | ICD-10-CM | POA: Diagnosis not present

## 2024-01-08 DIAGNOSIS — I480 Paroxysmal atrial fibrillation: Secondary | ICD-10-CM | POA: Diagnosis not present

## 2024-01-08 DIAGNOSIS — I10 Essential (primary) hypertension: Secondary | ICD-10-CM | POA: Diagnosis not present

## 2024-01-08 LAB — BASIC METABOLIC PANEL WITH GFR
Anion gap: 12 (ref 5–15)
BUN: 30 mg/dL — ABNORMAL HIGH (ref 8–23)
CO2: 25 mmol/L (ref 22–32)
Calcium: 9.2 mg/dL (ref 8.9–10.3)
Chloride: 102 mmol/L (ref 98–111)
Creatinine, Ser: 1.11 mg/dL — ABNORMAL HIGH (ref 0.44–1.00)
GFR, Estimated: 47 mL/min — ABNORMAL LOW (ref >60)
Glucose, Bld: 161 mg/dL — ABNORMAL HIGH (ref 70–99)
Potassium: 4.5 mmol/L (ref 3.5–5.1)
Sodium: 139 mmol/L (ref 135–145)

## 2024-01-08 NOTE — Patient Instructions (Addendum)
 If you receive a satisfaction survey regarding the Heart Failure Clinic, please take the time to fill it out. This way we can continue to provide excellent care and make any changes that need to be made.    Let us  know your furosemide  does and bring medications daily.   Medication Changes:  No medication changes today!  Lab Work:  Go over to the [facility]ICAL [facility]ALL. Go pass the gift shop and have your blood work completed.  We will only call you if the results are abnormal or if the provider would like to make medication changes.  No news is good news.   Follow-Up in: Please follow up with the Advanced Heart Failure Clinic in 6 months with Ellouise Class, FNP.   Thank you for choosing [facility].    At the Advanced Heart Failure Clinic, you and your health needs are our priority. We have a designated team specialized in the treatment of Heart Failure. This Care Team includes your primary Heart Failure Specialized Cardiologist (physician), Advanced Practice Providers (APPs- Physician Assistants and Nurse Practitioners), and Pharmacist who all work together to provide you with the care you need, when you need it.   You may see any of the following providers on your designated Care Team at your next follow up:  Dr. Toribio Fuel Dr. Ezra Shuck Dr. Ria Commander Dr. Morene Brownie Ellouise Class, FNP Jaun Bash, RPH-CPP  Please be sure to bring in all your medications bottles to every appointment.   Need to Contact Us :  If you have any questions or concerns before your next appointment please send us  a message through [place] or call our office at [phone].    TO LEAVE A MESSAGE FOR THE NURSE SELECT OPTION 2, PLEASE LEAVE A MESSAGE INCLUDING: YOUR NAME DATE OF BIRTH CALL BACK NUMBER REASON FOR CALL**this is important as we prioritize the call backs  YOU WILL RECEIVE A CALL BACK THE SAME DAY AS LONG AS YOU CALL BEFORE 4:00 PM

## 2024-01-11 ENCOUNTER — Ambulatory Visit (INDEPENDENT_AMBULATORY_CARE_PROVIDER_SITE_OTHER): Payer: Medicare Other | Admitting: Ophthalmology

## 2024-01-11 ENCOUNTER — Encounter (INDEPENDENT_AMBULATORY_CARE_PROVIDER_SITE_OTHER): Payer: Self-pay | Admitting: Ophthalmology

## 2024-01-11 DIAGNOSIS — Z961 Presence of intraocular lens: Secondary | ICD-10-CM

## 2024-01-11 DIAGNOSIS — H353132 Nonexudative age-related macular degeneration, bilateral, intermediate dry stage: Secondary | ICD-10-CM | POA: Diagnosis not present

## 2024-01-11 DIAGNOSIS — H18423 Band keratopathy, bilateral: Secondary | ICD-10-CM

## 2024-01-11 DIAGNOSIS — H35033 Hypertensive retinopathy, bilateral: Secondary | ICD-10-CM

## 2024-01-11 DIAGNOSIS — E119 Type 2 diabetes mellitus without complications: Secondary | ICD-10-CM

## 2024-01-11 DIAGNOSIS — I10 Essential (primary) hypertension: Secondary | ICD-10-CM

## 2024-01-13 DIAGNOSIS — E785 Hyperlipidemia, unspecified: Secondary | ICD-10-CM | POA: Diagnosis not present

## 2024-01-13 DIAGNOSIS — I4891 Unspecified atrial fibrillation: Secondary | ICD-10-CM | POA: Diagnosis not present

## 2024-01-13 DIAGNOSIS — M25551 Pain in right hip: Secondary | ICD-10-CM | POA: Diagnosis not present

## 2024-01-13 DIAGNOSIS — I10 Essential (primary) hypertension: Secondary | ICD-10-CM | POA: Diagnosis not present

## 2024-01-13 DIAGNOSIS — I130 Hypertensive heart and chronic kidney disease with heart failure and stage 1 through stage 4 chronic kidney disease, or unspecified chronic kidney disease: Secondary | ICD-10-CM | POA: Diagnosis not present

## 2024-01-13 DIAGNOSIS — I509 Heart failure, unspecified: Secondary | ICD-10-CM | POA: Diagnosis not present

## 2024-01-13 DIAGNOSIS — N182 Chronic kidney disease, stage 2 (mild): Secondary | ICD-10-CM | POA: Diagnosis not present

## 2024-01-13 DIAGNOSIS — E119 Type 2 diabetes mellitus without complications: Secondary | ICD-10-CM | POA: Diagnosis not present

## 2024-01-13 DIAGNOSIS — E1122 Type 2 diabetes mellitus with diabetic chronic kidney disease: Secondary | ICD-10-CM | POA: Diagnosis not present

## 2024-01-15 DIAGNOSIS — E1122 Type 2 diabetes mellitus with diabetic chronic kidney disease: Secondary | ICD-10-CM | POA: Diagnosis not present

## 2024-01-15 DIAGNOSIS — G43909 Migraine, unspecified, not intractable, without status migrainosus: Secondary | ICD-10-CM | POA: Diagnosis not present

## 2024-01-15 DIAGNOSIS — Z7984 Long term (current) use of oral hypoglycemic drugs: Secondary | ICD-10-CM | POA: Diagnosis not present

## 2024-01-15 DIAGNOSIS — E785 Hyperlipidemia, unspecified: Secondary | ICD-10-CM | POA: Diagnosis not present

## 2024-01-15 DIAGNOSIS — K219 Gastro-esophageal reflux disease without esophagitis: Secondary | ICD-10-CM | POA: Diagnosis not present

## 2024-01-15 DIAGNOSIS — I5033 Acute on chronic diastolic (congestive) heart failure: Secondary | ICD-10-CM | POA: Diagnosis not present

## 2024-01-15 DIAGNOSIS — J9601 Acute respiratory failure with hypoxia: Secondary | ICD-10-CM | POA: Diagnosis not present

## 2024-01-15 DIAGNOSIS — Z602 Problems related to living alone: Secondary | ICD-10-CM | POA: Diagnosis not present

## 2024-01-15 DIAGNOSIS — Z7901 Long term (current) use of anticoagulants: Secondary | ICD-10-CM | POA: Diagnosis not present

## 2024-01-15 DIAGNOSIS — Z9849 Cataract extraction status, unspecified eye: Secondary | ICD-10-CM | POA: Diagnosis not present

## 2024-01-15 DIAGNOSIS — M542 Cervicalgia: Secondary | ICD-10-CM | POA: Diagnosis not present

## 2024-01-15 DIAGNOSIS — H35033 Hypertensive retinopathy, bilateral: Secondary | ICD-10-CM | POA: Diagnosis not present

## 2024-01-15 DIAGNOSIS — E43 Unspecified severe protein-calorie malnutrition: Secondary | ICD-10-CM | POA: Diagnosis not present

## 2024-01-15 DIAGNOSIS — Z9049 Acquired absence of other specified parts of digestive tract: Secondary | ICD-10-CM | POA: Diagnosis not present

## 2024-01-15 DIAGNOSIS — I130 Hypertensive heart and chronic kidney disease with heart failure and stage 1 through stage 4 chronic kidney disease, or unspecified chronic kidney disease: Secondary | ICD-10-CM | POA: Diagnosis not present

## 2024-01-15 DIAGNOSIS — H3530 Unspecified macular degeneration: Secondary | ICD-10-CM | POA: Diagnosis not present

## 2024-01-15 DIAGNOSIS — M1611 Unilateral primary osteoarthritis, right hip: Secondary | ICD-10-CM | POA: Diagnosis not present

## 2024-01-15 DIAGNOSIS — I4891 Unspecified atrial fibrillation: Secondary | ICD-10-CM | POA: Diagnosis not present

## 2024-01-15 DIAGNOSIS — N183 Chronic kidney disease, stage 3 unspecified: Secondary | ICD-10-CM | POA: Diagnosis not present

## 2024-01-15 DIAGNOSIS — G8929 Other chronic pain: Secondary | ICD-10-CM | POA: Diagnosis not present

## 2024-01-15 DIAGNOSIS — Z86718 Personal history of other venous thrombosis and embolism: Secondary | ICD-10-CM | POA: Diagnosis not present

## 2024-01-15 DIAGNOSIS — M1711 Unilateral primary osteoarthritis, right knee: Secondary | ICD-10-CM | POA: Diagnosis not present

## 2024-01-15 DIAGNOSIS — Z5982 Transportation insecurity: Secondary | ICD-10-CM | POA: Diagnosis not present

## 2024-01-15 DIAGNOSIS — Z9181 History of falling: Secondary | ICD-10-CM | POA: Diagnosis not present

## 2024-01-16 ENCOUNTER — Encounter (INDEPENDENT_AMBULATORY_CARE_PROVIDER_SITE_OTHER): Payer: Self-pay | Admitting: Ophthalmology

## 2024-01-20 DIAGNOSIS — H18423 Band keratopathy, bilateral: Secondary | ICD-10-CM | POA: Diagnosis not present

## 2024-01-20 DIAGNOSIS — E119 Type 2 diabetes mellitus without complications: Secondary | ICD-10-CM | POA: Diagnosis not present

## 2024-01-20 DIAGNOSIS — Z961 Presence of intraocular lens: Secondary | ICD-10-CM | POA: Diagnosis not present

## 2024-01-20 DIAGNOSIS — H353132 Nonexudative age-related macular degeneration, bilateral, intermediate dry stage: Secondary | ICD-10-CM | POA: Diagnosis not present

## 2024-01-20 DIAGNOSIS — H26492 Other secondary cataract, left eye: Secondary | ICD-10-CM | POA: Diagnosis not present

## 2024-01-26 DIAGNOSIS — E1122 Type 2 diabetes mellitus with diabetic chronic kidney disease: Secondary | ICD-10-CM | POA: Diagnosis not present

## 2024-01-26 DIAGNOSIS — Z7901 Long term (current) use of anticoagulants: Secondary | ICD-10-CM | POA: Diagnosis not present

## 2024-01-26 DIAGNOSIS — G8929 Other chronic pain: Secondary | ICD-10-CM | POA: Diagnosis not present

## 2024-01-26 DIAGNOSIS — N183 Chronic kidney disease, stage 3 unspecified: Secondary | ICD-10-CM | POA: Diagnosis not present

## 2024-01-26 DIAGNOSIS — I4891 Unspecified atrial fibrillation: Secondary | ICD-10-CM | POA: Diagnosis not present

## 2024-01-26 DIAGNOSIS — J9601 Acute respiratory failure with hypoxia: Secondary | ICD-10-CM | POA: Diagnosis not present

## 2024-01-26 DIAGNOSIS — H35033 Hypertensive retinopathy, bilateral: Secondary | ICD-10-CM | POA: Diagnosis not present

## 2024-01-26 DIAGNOSIS — G43909 Migraine, unspecified, not intractable, without status migrainosus: Secondary | ICD-10-CM | POA: Diagnosis not present

## 2024-01-26 DIAGNOSIS — Z9849 Cataract extraction status, unspecified eye: Secondary | ICD-10-CM | POA: Diagnosis not present

## 2024-01-26 DIAGNOSIS — K219 Gastro-esophageal reflux disease without esophagitis: Secondary | ICD-10-CM | POA: Diagnosis not present

## 2024-01-26 DIAGNOSIS — Z602 Problems related to living alone: Secondary | ICD-10-CM | POA: Diagnosis not present

## 2024-01-26 DIAGNOSIS — Z5982 Transportation insecurity: Secondary | ICD-10-CM | POA: Diagnosis not present

## 2024-01-26 DIAGNOSIS — E43 Unspecified severe protein-calorie malnutrition: Secondary | ICD-10-CM | POA: Diagnosis not present

## 2024-01-26 DIAGNOSIS — H3530 Unspecified macular degeneration: Secondary | ICD-10-CM | POA: Diagnosis not present

## 2024-01-26 DIAGNOSIS — M1711 Unilateral primary osteoarthritis, right knee: Secondary | ICD-10-CM | POA: Diagnosis not present

## 2024-01-26 DIAGNOSIS — Z86718 Personal history of other venous thrombosis and embolism: Secondary | ICD-10-CM | POA: Diagnosis not present

## 2024-01-26 DIAGNOSIS — Z9049 Acquired absence of other specified parts of digestive tract: Secondary | ICD-10-CM | POA: Diagnosis not present

## 2024-01-26 DIAGNOSIS — E785 Hyperlipidemia, unspecified: Secondary | ICD-10-CM | POA: Diagnosis not present

## 2024-01-26 DIAGNOSIS — I5033 Acute on chronic diastolic (congestive) heart failure: Secondary | ICD-10-CM | POA: Diagnosis not present

## 2024-01-26 DIAGNOSIS — M1611 Unilateral primary osteoarthritis, right hip: Secondary | ICD-10-CM | POA: Diagnosis not present

## 2024-01-26 DIAGNOSIS — Z9181 History of falling: Secondary | ICD-10-CM | POA: Diagnosis not present

## 2024-01-26 DIAGNOSIS — M542 Cervicalgia: Secondary | ICD-10-CM | POA: Diagnosis not present

## 2024-01-26 DIAGNOSIS — I130 Hypertensive heart and chronic kidney disease with heart failure and stage 1 through stage 4 chronic kidney disease, or unspecified chronic kidney disease: Secondary | ICD-10-CM | POA: Diagnosis not present

## 2024-01-26 DIAGNOSIS — Z7984 Long term (current) use of oral hypoglycemic drugs: Secondary | ICD-10-CM | POA: Diagnosis not present

## 2024-01-27 DIAGNOSIS — M1711 Unilateral primary osteoarthritis, right knee: Secondary | ICD-10-CM | POA: Diagnosis not present

## 2024-01-27 DIAGNOSIS — G8929 Other chronic pain: Secondary | ICD-10-CM | POA: Diagnosis not present

## 2024-01-27 DIAGNOSIS — E1122 Type 2 diabetes mellitus with diabetic chronic kidney disease: Secondary | ICD-10-CM | POA: Diagnosis not present

## 2024-01-27 DIAGNOSIS — I5033 Acute on chronic diastolic (congestive) heart failure: Secondary | ICD-10-CM | POA: Diagnosis not present

## 2024-01-27 DIAGNOSIS — I130 Hypertensive heart and chronic kidney disease with heart failure and stage 1 through stage 4 chronic kidney disease, or unspecified chronic kidney disease: Secondary | ICD-10-CM | POA: Diagnosis not present

## 2024-01-27 DIAGNOSIS — M1611 Unilateral primary osteoarthritis, right hip: Secondary | ICD-10-CM | POA: Diagnosis not present

## 2024-01-27 DIAGNOSIS — N183 Chronic kidney disease, stage 3 unspecified: Secondary | ICD-10-CM | POA: Diagnosis not present

## 2024-02-04 DIAGNOSIS — M1611 Unilateral primary osteoarthritis, right hip: Secondary | ICD-10-CM | POA: Diagnosis not present

## 2024-02-04 DIAGNOSIS — E1122 Type 2 diabetes mellitus with diabetic chronic kidney disease: Secondary | ICD-10-CM | POA: Diagnosis not present

## 2024-02-04 DIAGNOSIS — G8929 Other chronic pain: Secondary | ICD-10-CM | POA: Diagnosis not present

## 2024-02-04 DIAGNOSIS — I130 Hypertensive heart and chronic kidney disease with heart failure and stage 1 through stage 4 chronic kidney disease, or unspecified chronic kidney disease: Secondary | ICD-10-CM | POA: Diagnosis not present

## 2024-02-04 DIAGNOSIS — I5033 Acute on chronic diastolic (congestive) heart failure: Secondary | ICD-10-CM | POA: Diagnosis not present

## 2024-02-04 DIAGNOSIS — N183 Chronic kidney disease, stage 3 unspecified: Secondary | ICD-10-CM | POA: Diagnosis not present

## 2024-02-04 DIAGNOSIS — M1711 Unilateral primary osteoarthritis, right knee: Secondary | ICD-10-CM | POA: Diagnosis not present

## 2024-02-05 DIAGNOSIS — G8929 Other chronic pain: Secondary | ICD-10-CM | POA: Diagnosis not present

## 2024-02-05 DIAGNOSIS — Z9181 History of falling: Secondary | ICD-10-CM | POA: Diagnosis not present

## 2024-02-05 DIAGNOSIS — E43 Unspecified severe protein-calorie malnutrition: Secondary | ICD-10-CM | POA: Diagnosis not present

## 2024-02-05 DIAGNOSIS — Z9049 Acquired absence of other specified parts of digestive tract: Secondary | ICD-10-CM | POA: Diagnosis not present

## 2024-02-05 DIAGNOSIS — N183 Chronic kidney disease, stage 3 unspecified: Secondary | ICD-10-CM | POA: Diagnosis not present

## 2024-02-05 DIAGNOSIS — M1611 Unilateral primary osteoarthritis, right hip: Secondary | ICD-10-CM | POA: Diagnosis not present

## 2024-02-05 DIAGNOSIS — Z7901 Long term (current) use of anticoagulants: Secondary | ICD-10-CM | POA: Diagnosis not present

## 2024-02-05 DIAGNOSIS — Z5982 Transportation insecurity: Secondary | ICD-10-CM | POA: Diagnosis not present

## 2024-02-05 DIAGNOSIS — Z602 Problems related to living alone: Secondary | ICD-10-CM | POA: Diagnosis not present

## 2024-02-05 DIAGNOSIS — H3530 Unspecified macular degeneration: Secondary | ICD-10-CM | POA: Diagnosis not present

## 2024-02-05 DIAGNOSIS — I5033 Acute on chronic diastolic (congestive) heart failure: Secondary | ICD-10-CM | POA: Diagnosis not present

## 2024-02-05 DIAGNOSIS — J9601 Acute respiratory failure with hypoxia: Secondary | ICD-10-CM | POA: Diagnosis not present

## 2024-02-05 DIAGNOSIS — G43909 Migraine, unspecified, not intractable, without status migrainosus: Secondary | ICD-10-CM | POA: Diagnosis not present

## 2024-02-05 DIAGNOSIS — Z9849 Cataract extraction status, unspecified eye: Secondary | ICD-10-CM | POA: Diagnosis not present

## 2024-02-05 DIAGNOSIS — M1711 Unilateral primary osteoarthritis, right knee: Secondary | ICD-10-CM | POA: Diagnosis not present

## 2024-02-05 DIAGNOSIS — I130 Hypertensive heart and chronic kidney disease with heart failure and stage 1 through stage 4 chronic kidney disease, or unspecified chronic kidney disease: Secondary | ICD-10-CM | POA: Diagnosis not present

## 2024-02-05 DIAGNOSIS — I4891 Unspecified atrial fibrillation: Secondary | ICD-10-CM | POA: Diagnosis not present

## 2024-02-05 DIAGNOSIS — Z86718 Personal history of other venous thrombosis and embolism: Secondary | ICD-10-CM | POA: Diagnosis not present

## 2024-02-05 DIAGNOSIS — M542 Cervicalgia: Secondary | ICD-10-CM | POA: Diagnosis not present

## 2024-02-05 DIAGNOSIS — H35033 Hypertensive retinopathy, bilateral: Secondary | ICD-10-CM | POA: Diagnosis not present

## 2024-02-05 DIAGNOSIS — E785 Hyperlipidemia, unspecified: Secondary | ICD-10-CM | POA: Diagnosis not present

## 2024-02-05 DIAGNOSIS — Z7984 Long term (current) use of oral hypoglycemic drugs: Secondary | ICD-10-CM | POA: Diagnosis not present

## 2024-02-05 DIAGNOSIS — K219 Gastro-esophageal reflux disease without esophagitis: Secondary | ICD-10-CM | POA: Diagnosis not present

## 2024-02-05 DIAGNOSIS — E1122 Type 2 diabetes mellitus with diabetic chronic kidney disease: Secondary | ICD-10-CM | POA: Diagnosis not present

## 2024-02-05 NOTE — Result Encounter Note (Signed)
 Patient aware via spoke with daughter and she will have her increase as instructed. Will send readings within the next 10-14 days

## 2024-02-17 DIAGNOSIS — H26491 Other secondary cataract, right eye: Secondary | ICD-10-CM | POA: Diagnosis not present

## 2024-02-17 DIAGNOSIS — Z961 Presence of intraocular lens: Secondary | ICD-10-CM | POA: Diagnosis not present

## 2024-02-18 DIAGNOSIS — Z9049 Acquired absence of other specified parts of digestive tract: Secondary | ICD-10-CM | POA: Diagnosis not present

## 2024-02-18 DIAGNOSIS — M542 Cervicalgia: Secondary | ICD-10-CM | POA: Diagnosis not present

## 2024-02-18 DIAGNOSIS — H35033 Hypertensive retinopathy, bilateral: Secondary | ICD-10-CM | POA: Diagnosis not present

## 2024-02-18 DIAGNOSIS — N183 Chronic kidney disease, stage 3 unspecified: Secondary | ICD-10-CM | POA: Diagnosis not present

## 2024-02-18 DIAGNOSIS — K219 Gastro-esophageal reflux disease without esophagitis: Secondary | ICD-10-CM | POA: Diagnosis not present

## 2024-02-18 DIAGNOSIS — E785 Hyperlipidemia, unspecified: Secondary | ICD-10-CM | POA: Diagnosis not present

## 2024-02-18 DIAGNOSIS — Z602 Problems related to living alone: Secondary | ICD-10-CM | POA: Diagnosis not present

## 2024-02-18 DIAGNOSIS — M1711 Unilateral primary osteoarthritis, right knee: Secondary | ICD-10-CM | POA: Diagnosis not present

## 2024-02-18 DIAGNOSIS — I130 Hypertensive heart and chronic kidney disease with heart failure and stage 1 through stage 4 chronic kidney disease, or unspecified chronic kidney disease: Secondary | ICD-10-CM | POA: Diagnosis not present

## 2024-02-18 DIAGNOSIS — M1611 Unilateral primary osteoarthritis, right hip: Secondary | ICD-10-CM | POA: Diagnosis not present

## 2024-02-18 DIAGNOSIS — Z86718 Personal history of other venous thrombosis and embolism: Secondary | ICD-10-CM | POA: Diagnosis not present

## 2024-02-18 DIAGNOSIS — G43909 Migraine, unspecified, not intractable, without status migrainosus: Secondary | ICD-10-CM | POA: Diagnosis not present

## 2024-02-18 DIAGNOSIS — I5033 Acute on chronic diastolic (congestive) heart failure: Secondary | ICD-10-CM | POA: Diagnosis not present

## 2024-02-18 DIAGNOSIS — Z9849 Cataract extraction status, unspecified eye: Secondary | ICD-10-CM | POA: Diagnosis not present

## 2024-02-18 DIAGNOSIS — Z5982 Transportation insecurity: Secondary | ICD-10-CM | POA: Diagnosis not present

## 2024-02-18 DIAGNOSIS — Z7984 Long term (current) use of oral hypoglycemic drugs: Secondary | ICD-10-CM | POA: Diagnosis not present

## 2024-02-18 DIAGNOSIS — G8929 Other chronic pain: Secondary | ICD-10-CM | POA: Diagnosis not present

## 2024-02-18 DIAGNOSIS — I4891 Unspecified atrial fibrillation: Secondary | ICD-10-CM | POA: Diagnosis not present

## 2024-02-18 DIAGNOSIS — Z9181 History of falling: Secondary | ICD-10-CM | POA: Diagnosis not present

## 2024-02-18 DIAGNOSIS — Z7901 Long term (current) use of anticoagulants: Secondary | ICD-10-CM | POA: Diagnosis not present

## 2024-02-18 DIAGNOSIS — E1122 Type 2 diabetes mellitus with diabetic chronic kidney disease: Secondary | ICD-10-CM | POA: Diagnosis not present

## 2024-02-18 DIAGNOSIS — H3530 Unspecified macular degeneration: Secondary | ICD-10-CM | POA: Diagnosis not present

## 2024-02-18 DIAGNOSIS — J9601 Acute respiratory failure with hypoxia: Secondary | ICD-10-CM | POA: Diagnosis not present

## 2024-02-18 DIAGNOSIS — E43 Unspecified severe protein-calorie malnutrition: Secondary | ICD-10-CM | POA: Diagnosis not present

## 2024-03-23 ENCOUNTER — Other Ambulatory Visit: Payer: Self-pay

## 2024-03-23 ENCOUNTER — Observation Stay
Admission: EM | Admit: 2024-03-23 | Discharge: 2024-03-24 | Disposition: A | Discharge status: Home Health | Attending: Internal Medicine | Admitting: Internal Medicine

## 2024-03-23 DIAGNOSIS — I110 Hypertensive heart disease with heart failure: Secondary | ICD-10-CM | POA: Diagnosis not present

## 2024-03-23 DIAGNOSIS — K922 Gastrointestinal hemorrhage, unspecified: Secondary | ICD-10-CM | POA: Diagnosis not present

## 2024-03-23 DIAGNOSIS — Z79899 Other long term (current) drug therapy: Secondary | ICD-10-CM | POA: Diagnosis not present

## 2024-03-23 DIAGNOSIS — I4891 Unspecified atrial fibrillation: Secondary | ICD-10-CM | POA: Diagnosis not present

## 2024-03-23 DIAGNOSIS — Z6841 Body Mass Index (BMI) 40.0 and over, adult: Secondary | ICD-10-CM | POA: Diagnosis not present

## 2024-03-23 DIAGNOSIS — E119 Type 2 diabetes mellitus without complications: Secondary | ICD-10-CM | POA: Insufficient documentation

## 2024-03-23 DIAGNOSIS — K625 Hemorrhage of anus and rectum: Secondary | ICD-10-CM | POA: Diagnosis present

## 2024-03-23 DIAGNOSIS — I509 Heart failure, unspecified: Secondary | ICD-10-CM | POA: Insufficient documentation

## 2024-03-23 DIAGNOSIS — E66813 Obesity, class 3: Secondary | ICD-10-CM | POA: Diagnosis not present

## 2024-03-23 LAB — COMPREHENSIVE METABOLIC PANEL WITH GFR
ALT: 12 U/L (ref 0–44)
AST: 22 U/L (ref 15–41)
Albumin: 4.1 g/dL (ref 3.5–5.0)
Alkaline Phosphatase: 102 U/L (ref 38–126)
Anion gap: 12 (ref 5–15)
BUN: 24 mg/dL — ABNORMAL HIGH (ref 8–23)
CO2: 26 mmol/L (ref 22–32)
Calcium: 9.1 mg/dL (ref 8.9–10.3)
Chloride: 102 mmol/L (ref 98–111)
Creatinine, Ser: 0.97 mg/dL (ref 0.44–1.00)
GFR, Estimated: 55 mL/min — ABNORMAL LOW (ref >60)
Glucose, Bld: 177 mg/dL — ABNORMAL HIGH (ref 70–99)
Potassium: 4.3 mmol/L (ref 3.5–5.1)
Sodium: 140 mmol/L (ref 135–145)
Total Bilirubin: 1 mg/dL (ref 0.0–1.2)
Total Protein: 7.7 g/dL (ref 6.5–8.1)

## 2024-03-23 LAB — CBG MONITORING, ED: Glucose-Capillary: 171 mg/dL — ABNORMAL HIGH (ref 70–99)

## 2024-03-23 LAB — CBC
HCT: 44 % (ref 36.0–46.0)
Hemoglobin: 14.5 g/dL (ref 12.0–15.0)
MCH: 30.9 pg (ref 26.0–34.0)
MCHC: 33 g/dL (ref 30.0–36.0)
MCV: 93.8 fL (ref 80.0–100.0)
Platelets: 185 K/uL (ref 150–400)
RBC: 4.69 MIL/uL (ref 3.87–5.11)
RDW: 12.7 % (ref 11.5–15.5)
WBC: 7.2 K/uL (ref 4.0–10.5)
nRBC: 0 % (ref 0.0–0.2)

## 2024-03-23 LAB — TYPE AND SCREEN
ABO/RH(D): A POS
Antibody Screen: NEGATIVE

## 2024-03-23 LAB — HEMOGLOBIN AND HEMATOCRIT, BLOOD
HCT: 39.4 % (ref 36.0–46.0)
HCT: 40.3 % (ref 36.0–46.0)
Hemoglobin: 13.3 g/dL (ref 12.0–15.0)
Hemoglobin: 13.3 g/dL (ref 12.0–15.0)

## 2024-03-23 LAB — GLUCOSE, CAPILLARY
Glucose-Capillary: 225 mg/dL — ABNORMAL HIGH (ref 70–99)
Glucose-Capillary: 109 mg/dL — ABNORMAL HIGH (ref 70–99)

## 2024-03-23 LAB — HEMOGLOBIN A1C
Hgb A1c MFr Bld: 7.1 % — ABNORMAL HIGH (ref 4.8–5.6)
Mean Plasma Glucose: 157.07 mg/dL

## 2024-03-23 LAB — PROTIME-INR
INR: 1.2 (ref 0.8–1.2)
Prothrombin Time: 15.5 s — ABNORMAL HIGH (ref 11.4–15.2)

## 2024-03-23 MED ORDER — SIMVASTATIN 20 MG PO TABS
40.0000 mg | ORAL_TABLET | Freq: Every day | ORAL | Status: DC
  Administered 2024-03-23 – 2024-03-24 (×2): 40 mg via ORAL
  Filled 2024-03-23: qty 2
  Filled 2024-03-23: qty 4

## 2024-03-23 MED ORDER — ONDANSETRON HCL 4 MG PO TABS: 4.0000 mg | ORAL_TABLET | Freq: Four times a day (QID) | ORAL | Status: DC | PRN

## 2024-03-23 MED ORDER — SENNOSIDES-DOCUSATE SODIUM 8.6-50 MG PO TABS: 1.0000 | ORAL_TABLET | Freq: Every evening | ORAL | Status: DC | PRN

## 2024-03-23 MED ORDER — METOPROLOL SUCCINATE ER 50 MG PO TB24
50.0000 mg | ORAL_TABLET | Freq: Every day | ORAL | Status: DC
  Administered 2024-03-24: 50 mg via ORAL
  Filled 2024-03-23: qty 1

## 2024-03-23 MED ORDER — ACETAMINOPHEN 325 MG PO TABS: 650.0000 mg | ORAL_TABLET | Freq: Three times a day (TID) | ORAL | Status: DC | PRN

## 2024-03-23 MED ORDER — DILTIAZEM HCL ER COATED BEADS 120 MG PO CP24
240.0000 mg | ORAL_CAPSULE | Freq: Every day | ORAL | Status: DC
  Administered 2024-03-23 – 2024-03-24 (×2): 240 mg via ORAL
  Filled 2024-03-23: qty 2
  Filled 2024-03-23: qty 1

## 2024-03-23 MED ORDER — METOPROLOL SUCCINATE ER 50 MG PO TB24
50.0000 mg | ORAL_TABLET | Freq: Once | ORAL | Status: AC
  Administered 2024-03-23: 50 mg via ORAL
  Filled 2024-03-23: qty 1

## 2024-03-23 MED ORDER — ONDANSETRON HCL 4 MG/2ML IJ SOLN: 4.0000 mg | Freq: Four times a day (QID) | INTRAMUSCULAR | Status: DC | PRN

## 2024-03-23 MED ORDER — PANTOPRAZOLE SODIUM 40 MG PO TBEC
40.0000 mg | DELAYED_RELEASE_TABLET | Freq: Every day | ORAL | Status: DC
Start: 2024-03-23 — End: 2024-03-24
  Administered 2024-03-23 – 2024-03-24 (×2): 40 mg via ORAL
  Filled 2024-03-23 (×2): qty 1

## 2024-03-23 MED ORDER — INSULIN ASPART 100 UNIT/ML IJ SOLN
0.0000 [IU] | Freq: Three times a day (TID) | INTRAMUSCULAR | Status: DC
  Administered 2024-03-23: 3 [IU] via SUBCUTANEOUS
  Administered 2024-03-23 – 2024-03-24 (×2): 2 [IU] via SUBCUTANEOUS
  Filled 2024-03-23: qty 2
  Filled 2024-03-23 (×2): qty 1

## 2024-03-23 NOTE — ED Notes (Signed)
 CCMD called to admit patient to monitor

## 2024-03-23 NOTE — ED Triage Notes (Signed)
 Pt to ED POV with daughter for bright red rectal bleeding with clots this morning. Pt takes Eliquis . Lives at home by self.

## 2024-03-23 NOTE — ED Provider Notes (Signed)
 [facility] Provider Note    Event Date/Time   First MD Initiated Contact with Patient 03/23/24 [phone]     (approximate)   History   Rectal Bleeding   HPI  Victoria Dalton is a 89 y.o. female past medical history significant for atrial fibrillation on Eliquis  who presents to the emergency department with bright red blood per rectum.  Patient states that she went to use the bathroom earlier today and was urinated and then noticed that there was lots of blood in the toilet.  States that she has had a history of hemorrhoids but has not had any significant GI bleeding in the past.  Denies any abdominal pain.  Denies any nausea or vomiting.  No NSAID use.  Took Tylenol  for her arthritis pain.  Remote history of colonoscopy.  No history of upper GI bleed.     Physical Exam   Triage Vital Signs: ED Triage Vitals  Encounter Vitals Group     BP 03/23/24 0720 (!) 143/74     Girls Systolic BP Percentile --      Girls Diastolic BP Percentile --      Boys Systolic BP Percentile --      Boys Diastolic BP Percentile --      Pulse Rate 03/23/24 0720 82     Resp 03/23/24 0720 18     Temp 03/23/24 0720 (!) 97.5 F (36.4 C)     Temp Source 03/23/24 0720 Oral     SpO2 03/23/24 0720 98 %     Weight --      Height --      Head Circumference --      Peak Flow --      Pain Score 03/23/24 0723 0     Pain Loc --      Pain Education --      Exclude from Growth Chart --     Most recent vital signs: Vitals:   03/23/24 1137 03/23/24 1152  BP: 120/82 120/82  Pulse: 83   Resp: 17   Temp: 98.2 F (36.8 C)   SpO2: 98%     Physical Exam Exam conducted with a chaperone present.  Constitutional:      Appearance: She is well-developed.  HENT:     Head: Atraumatic.  Eyes:     Extraocular Movements: Extraocular movements intact.     Conjunctiva/sclera: Conjunctivae normal.     Pupils: Pupils are equal, round, and reactive to light.  Cardiovascular:     Rate and  Rhythm: Tachycardia present. Rhythm irregular.  Pulmonary:     Effort: No respiratory distress.  Abdominal:     General: There is no distension.     Tenderness: There is no abdominal tenderness.  Genitourinary:    Comments: Dark red blood per rectum but no obvious signs of melena Musculoskeletal:        General: Normal range of motion.     Cervical back: Normal range of motion.  Skin:    General: Skin is warm.     Capillary Refill: Capillary refill takes less than 2 seconds.  Neurological:     Mental Status: She is alert. Mental status is at baseline.     IMPRESSION / MDM / ASSESSMENT AND PLAN / ED COURSE  I reviewed the triage vital signs and the nursing notes.  Differential diagnosis including lower GI bleed, significant upper GI bleed, atrial fibrillation with rapid rate, anemia  EKG  I, Clotilda Punter, the attending physician,  personally viewed and interpreted this ECG.  Atrial fibrillation with a rapid rate, no significant ST elevation or depression.  No findings of acute ischemia  Atrial fibrillation with a heart rate up to 130s while on cardiac telemetry.   LABS (all labs ordered are listed, but only abnormal results are displayed) Labs interpreted as -    Labs Reviewed  COMPREHENSIVE METABOLIC PANEL WITH GFR - Abnormal; Notable for the following components:      Result Value   Glucose, Bld 177 (*)    BUN 24 (*)    GFR, Estimated 55 (*)    All other components within normal limits  PROTIME-INR - Abnormal; Notable for the following components:   Prothrombin Time 15.5 (*)    All other components within normal limits  HEMOGLOBIN A1C - Abnormal; Notable for the following components:   Hgb A1c MFr Bld 7.1 (*)    All other components within normal limits  CBG MONITORING, ED - Abnormal; Notable for the following components:   Glucose-Capillary 171 (*)    All other components within normal limits  CBC  HEMOGLOBIN AND HEMATOCRIT, BLOOD  HEMOGLOBIN AND HEMATOCRIT,  BLOOD  POC OCCULT BLOOD, ED  TYPE AND SCREEN     MDM No significant leukocytosis.  Hemoglobin is stable at 14.5 and does not need blood transfusion.  Normal platelets.  No significant electrolyte abnormality.  Patient with A-fib with RVR, missed her home medication this morning so given her home metoprolol  with improvement of her heart rate   Clinical Course as of 03/23/24 1616  Wed Mar 23, 2024  1040 Discussed with gastroenterology with Dr. Unk who recommended admission to trend hemoglobin and further monitoring.  Discussed with the hospitalist for admission. [SM]    Clinical Course User Index [SM] Suzanne Kirsch, MD   Patient is submitted for significant lower GI bleed on anticoagulation  PROCEDURES:  Critical Care performed: No  Procedures  Patient's presentation is most consistent with acute presentation with potential threat to life or bodily function.   MEDICATIONS ORDERED IN ED: Medications  acetaminophen  (TYLENOL ) tablet 650 mg (has no administration in time range)  diltiazem  (CARDIZEM  CD) 24 hr capsule 240 mg (240 mg Oral Given 03/23/24 1152)  metoprolol  succinate (TOPROL -XL) 24 hr tablet 50 mg (has no administration in time range)  simvastatin  (ZOCOR ) tablet 40 mg (40 mg Oral Given 03/23/24 1153)  pantoprazole  (PROTONIX ) EC tablet 40 mg (40 mg Oral Given 03/23/24 1153)  ondansetron  (ZOFRAN ) tablet 4 mg (has no administration in time range)    Or  ondansetron  (ZOFRAN ) injection 4 mg (has no administration in time range)  senna-docusate (Senokot-S) tablet 1 tablet (has no administration in time range)  insulin  aspart (novoLOG ) injection 0-9 Units (2 Units Subcutaneous Given 03/23/24 1152)  metoprolol  succinate (TOPROL -XL) 24 hr tablet 50 mg (50 mg Oral Given 03/23/24 0959)    FINAL CLINICAL IMPRESSION(S) / ED DIAGNOSES   Final diagnoses:  Rectal bleeding     Rx / DC Orders   ED Discharge Orders     None        Note:  This document was prepared  using Dragon voice recognition software and may include unintentional dictation errors.   Suzanne Kirsch, MD 03/23/24 1616

## 2024-03-23 NOTE — ED Notes (Signed)
 Patient assisted to use toilet. Patient required 2 person assist due to L knee pain and weakness. Patient urinated and wiped with a small smear of blood. Patient did not have a bowel movement at this time. Patient returned to bed without complication. Patient denies needs at this time.

## 2024-03-23 NOTE — ED Notes (Signed)
 Pt assisted to use toilet with 2x assist. Pt complaining of left knee pain. Pt is back in bed with no further needs.

## 2024-03-23 NOTE — Plan of Care (Signed)

## 2024-03-23 NOTE — H&P (Signed)
 History and Physical    Victoria Dalton FMW:[id number] DOB: [birth year] DOA: 03/23/2024  PCP: Victoria Lenis, MD (Confirm with patient/family/NH records and if not entered, this has to be entered at [facility] point of entry) Patient coming from: Home  I have personally briefly reviewed patient's old medical records in [facility]  Chief Complaint: Rectal bleed  HPI: Victoria Dalton is a 89 y.o. female with medical history significant of PAF on Eliquis , IIDM, HTN, H LD, presented with new onset of rectal bleed.  Patient went up this morning to go to bathroom, passed first episode of  full of blood clots, when she wiped, she found there is bright red blood with blood clots.  Soon afterward, she passed second episode of rectal bleeding, mainly blood clots as well.  Denied any abdominal pain no chest pain no palpitations or shortness of breath or lightheadedness.  Patient has regular bowel movement every day, denied any constipation.  She has history of hemorrhoids but reported that this has been controlled.  Patient only takes Tylenol  for joint pain.  No NSAIDs use.  ED Course: Afebrile, tachycardia, afebrile, blood pressure 133/74, O2 saturation 98% on room air.  Blood work showed hemoglobin 14.5 WBC 7.2 BUN 24 creatinine 0.9.  Review of Systems: As per HPI otherwise 14 point review of systems negative.    Past Medical History:  Diagnosis Date   Atrial fibrillation (HCC)    CHF (congestive heart failure) (HCC)    Diabetes mellitus without complication (HCC)    Dyspnea    Gallbladder calculus    GERD (gastroesophageal reflux disease)    Hyperlipidemia    Hypertension    Hypertensive retinopathy    OU   Kidney stone    Macular degeneration    Dry OU   Mass    Parotid swelling    UTI (lower urinary tract infection)     Past Surgical History:  Procedure Laterality Date   CATARACT EXTRACTION Bilateral 02/2020   Dr. Helene Deacon   CHOLECYSTECTOMY N/A 04/30/2016    Procedure: CHOLECYSTECTOMY;  Surgeon: Laneta JULIANNA Luna, MD;  Location: [facility] ORS;  Service: General;  Laterality: N/A;   DIAGNOSTIC LAPAROSCOPIC LIVER BIOPSY  02/28/2016   Procedure: DIAGNOSTIC LAPAROSCOPIC LIVER BIOPSY;  Surgeon: Laneta JULIANNA Luna, MD;  Location: [facility] ORS;  Service: General;;   ERCP N/A 01/13/2016   Procedure: ENDOSCOPIC RETROGRADE CHOLANGIOPANCREATOGRAPHY (ERCP);  Surgeon: Rogelia Copping, MD;  Location: [facility] ENDOSCOPY;  Service: Endoscopy;  Laterality: N/A;   ERCP N/A 04/08/2016   Procedure: ENDOSCOPIC RETROGRADE CHOLANGIOPANCREATOGRAPHY (ERCP) Stent removal;  Surgeon: Rogelia Copping, MD;  Location: [facility] ENDOSCOPY;  Service: Endoscopy;  Laterality: N/A;   EYE SURGERY Bilateral 02/2020   Cat Sx - Dr. Helene Deacon   INTRAOPERATIVE CHOLANGIOGRAM  04/30/2016   Procedure: INTRAOPERATIVE CHOLANGIOGRAM;  Surgeon: Laneta JULIANNA Luna, MD;  Location: [facility] ORS;  Service: General;;   KNEE ARTHROSCOPY Left    LAPAROSCOPY  02/28/2016   Procedure: Diagnotic laparoscopy with omental biopsy;  Surgeon: Laneta JULIANNA Luna, MD;  Location: [facility] ORS;  Service: General;;   TONSILLECTOMY       reports that she has never smoked. She has never used smokeless tobacco. She reports that she does not drink alcohol  and does not use drugs.  Allergies  Allergen Reactions   Chlorhexidine  Itching   Ramipril Other (See Comments)    IRREGULAR HEART BEAT   Penicillins Rash    Has patient had a PCN reaction causing immediate rash, facial/tongue/throat swelling, SOB or lightheadedness  with hypotension: Yes Has patient had a PCN reaction causing severe rash involving mucus membranes or skin necrosis: Yes Has patient had a PCN reaction that required hospitalization No Has patient had a PCN reaction occurring within the last 10 years: No If all of the above answers are NO, then may proceed with Cephalosporin use.    Poison Oak Extract Rash   Sulfa Antibiotics Rash    Family History  Problem Relation Age of Onset   Cancer  Father    Heart attack Father    Cancer Brother    Diabetes Brother    Diabetes Son    Breast cancer Neg Hx      Prior to Admission medications   Medication Sig Start Date End Date Taking? Authorizing Provider  acetaminophen  (TYLENOL ) 650 MG CR tablet Take 650 mg by mouth every 8 (eight) hours as needed for pain.   Yes [provider]  apixaban  (ELIQUIS ) 5 MG TABS tablet Take 1 tablet (5 mg total) by mouth 2 (two) times daily. 03/26/22  Yes Masoud, Sheralyn, MD  carboxymethylcellulose (REFRESH PLUS) 0.5 % SOLN 1 drop 3 (three) times daily as needed.   Yes [provider]  diltiazem  (CARDIZEM  CD) 240 MG 24 hr capsule Take 1 capsule (240 mg total) by mouth daily. 03/26/22  Yes Britta Sheralyn, MD  furosemide  (LASIX ) 40 MG tablet Take 1 tablet (40 mg total) by mouth every Monday, Wednesday, and Friday. Patient taking differently: Take 20 mg by mouth every Monday, Wednesday, and Friday. Half tablet 09/16/23  Yes Hackney, Tina A, FNP  glipiZIDE (GLUCOTROL) 5 MG tablet Take 5-10 mg by mouth daily. 2 tabs in the morning and one tab at night   Yes [provider]  metoprolol  succinate (TOPROL -XL) 50 MG 24 hr tablet Take 50 mg by mouth in the morning. Take with or immediately following a meal.   Yes [provider]  Multiple Vitamins-Minerals (PRESERVISION AREDS 2 PO) Take by mouth.   Yes [provider]  pantoprazole  (PROTONIX ) 40 MG tablet TAKE 1 TABLET(40 MG) BY MOUTH DAILY 03/26/22  Yes Masoud, Sheralyn, MD  simvastatin  (ZOCOR ) 40 MG tablet Take 1 tablet (40 mg total) by mouth daily. 03/26/22  Yes Masoud, Sheralyn, MD  acetaminophen  (TYLENOL ) 500 MG tablet Take 500 mg by mouth every 6 (six) hours as needed for mild pain. Patient not taking: Reported on 03/23/2024    [provider]    Physical Exam: Vitals:   03/23/24 0724 03/23/24 0959 03/23/24 1000 03/23/24 1015  BP:  (!) 114/94 (!) 133/97   Pulse:  (!) 119    Resp:  19 15   Temp:      TempSrc:       SpO2:  97% 98% 98%  Weight: 109.8 kg     Height: 5' 2 (1.575 m)       Constitutional: NAD, calm, comfortable Vitals:   03/23/24 0724 03/23/24 0959 03/23/24 1000 03/23/24 1015  BP:  (!) 114/94 (!) 133/97   Pulse:  (!) 119    Resp:  19 15   Temp:      TempSrc:      SpO2:  97% 98% 98%  Weight: 109.8 kg     Height: 5' 2 (1.575 m)      Eyes: PERRL, lids and conjunctivae normal ENMT: Mucous membranes are moist. Posterior pharynx clear of any exudate or lesions.Normal dentition.  Neck: normal, supple, no masses, no thyromegaly Respiratory: clear to auscultation bilaterally, no wheezing, no crackles. Normal  respiratory effort. No accessory muscle use.  Cardiovascular: Irregular heart rate,, no murmurs / rubs / gallops. No extremity edema. 2+ pedal pulses. No carotid bruits.  Abdomen: no tenderness, no masses palpated. No hepatosplenomegaly. Bowel sounds positive.  Musculoskeletal: no clubbing / cyanosis. No joint deformity upper and lower extremities. Good ROM, no contractures. Normal muscle tone.  Skin: no rashes, lesions, ulcers. No induration Neurologic: CN 2-12 grossly intact. Sensation intact, DTR normal. Strength 5/5 in all 4.  Psychiatric: Normal judgment and insight. Alert and oriented x 3. Normal mood.   (Anything < 9 systems with 2 bullets each down codes to level 1) (If patient refuses exam can't bill higher level) (Make sure to document decubitus ulcers present on admission -- if possible -- and whether patient has chronic indwelling catheter at time of admission)  Labs on Admission: I have personally reviewed following labs and imaging studies  CBC: Recent Labs  Lab 03/23/24 0742  WBC 7.2  HGB 14.5  HCT 44.0  MCV 93.8  PLT 185   Basic Metabolic Panel: Recent Labs  Lab 03/23/24 0742  NA 140  K 4.3  CL 102  CO2 26  GLUCOSE 177*  BUN 24*  CREATININE 0.97  CALCIUM 9.1   GFR: Estimated Creatinine Clearance: 44.1 mL/min (by C-G formula based on SCr of 0.97  mg/dL). Liver Function Tests: Recent Labs  Lab 03/23/24 0742  AST 22  ALT 12  ALKPHOS 102  BILITOT 1.0  PROT 7.7  ALBUMIN 4.1   No results for input(s): LIPASE, AMYLASE in the last 168 hours. No results for input(s): AMMONIA in the last 168 hours. Coagulation Profile: Recent Labs  Lab 03/23/24 0742  INR 1.2   Cardiac Enzymes: No results for input(s): CKTOTAL, CKMB, CKMBINDEX, TROPONINI in the last 168 hours. BNP (last 3 results) No results for input(s): PROBNP in the last 8760 hours. HbA1C: No results for input(s): HGBA1C in the last 72 hours. CBG: No results for input(s): GLUCAP in the last 168 hours. Lipid Profile: No results for input(s): CHOL, HDL, LDLCALC, TRIG, CHOLHDL, LDLDIRECT in the last 72 hours. Thyroid  Function Tests: No results for input(s): TSH, T4TOTAL, FREET4, T3FREE, THYROIDAB in the last 72 hours. Anemia Panel: No results for input(s): VITAMINB12, FOLATE, FERRITIN, TIBC, IRON, RETICCTPCT in the last 72 hours. Urine analysis:    Component Value Date/Time   COLORURINE YELLOW (A) 03/25/2023 2208   APPEARANCEUR HAZY (A) 03/25/2023 2208   APPEARANCEUR Clear 11/18/2019 1359   LABSPEC 1.040 (H) 03/25/2023 2208   LABSPEC 1.011 08/30/2013 0053   PHURINE 5.0 03/25/2023 2208   GLUCOSEU NEGATIVE 03/25/2023 2208   GLUCOSEU see comment 08/30/2013 0053   HGBUR SMALL (A) 03/25/2023 2208   BILIRUBINUR NEGATIVE 03/25/2023 2208   BILIRUBINUR negative 02/07/2021 1828   BILIRUBINUR Negative 11/18/2019 1359   BILIRUBINUR see comment 08/30/2013 0053   KETONESUR NEGATIVE 03/25/2023 2208   PROTEINUR NEGATIVE 03/25/2023 2208   UROBILINOGEN 0.2 02/07/2021 1828   NITRITE POSITIVE (A) 03/25/2023 2208   LEUKOCYTESUR MODERATE (A) 03/25/2023 2208   LEUKOCYTESUR see comment 08/30/2013 0053    Radiological Exams on Admission: No results found.  EKG: Independently reviewed.  A-fib, with RVR, no acute ST  changes.  Assessment/Plan Principal Problem:   Lower GI bleed Active Problems:   Atrial fibrillation with RVR (HCC)  (please populate well all problems here in Problem List. (For example, if patient is on BP meds at home and you resume or decide to hold them, it is a problem that needs  to be her. Same for CAD, COPD, HLD and so on)  Hematochezia - Clinically suspect diverticulosis nonpainful lower GI bleed.  Probably self-limiting.  GI consulted by ED who recommended conservative management, no plans for GI intervention emergently. - Hold off Eliquis  - Recheck H&H this afternoon and tonight, and transfuse for hemodynamic instability. - Start liquid diet  A-fib with RVR - Resume home rate control medication including Cardizem  and metoprolol  -Hold off Eliquis   IIDM -SSI for now  Total time spent on patient care 55 minutes DVT prophylaxis: SCD Code Status: DNR/DNI Family Communication: Daughter at bedside Disposition Plan: Expect less than 2 midnight hospital stay Consults called: Curbside consult with GI Admission status: Telemetry observation   Cort ONEIDA Mana MD [facility] Pager [phone]  03/23/2024, 11:33 AM

## 2024-03-24 DIAGNOSIS — I4891 Unspecified atrial fibrillation: Secondary | ICD-10-CM

## 2024-03-24 DIAGNOSIS — K922 Gastrointestinal hemorrhage, unspecified: Secondary | ICD-10-CM | POA: Diagnosis not present

## 2024-03-24 LAB — GLUCOSE, CAPILLARY
Glucose-Capillary: 174 mg/dL — ABNORMAL HIGH (ref 70–99)
Glucose-Capillary: 227 mg/dL — ABNORMAL HIGH (ref 70–99)

## 2024-03-24 LAB — BASIC METABOLIC PANEL WITH GFR
Anion gap: 11 (ref 5–15)
BUN: 21 mg/dL (ref 8–23)
CO2: 26 mmol/L (ref 22–32)
Calcium: 9.4 mg/dL (ref 8.9–10.3)
Chloride: 103 mmol/L (ref 98–111)
Creatinine, Ser: 0.94 mg/dL (ref 0.44–1.00)
GFR, Estimated: 57 mL/min — ABNORMAL LOW (ref >60)
Glucose, Bld: 126 mg/dL — ABNORMAL HIGH (ref 70–99)
Potassium: 4.5 mmol/L (ref 3.5–5.1)
Sodium: 140 mmol/L (ref 135–145)

## 2024-03-24 LAB — CBC
HCT: 41.9 % (ref 36.0–46.0)
Hemoglobin: 13.8 g/dL (ref 12.0–15.0)
MCH: 31 pg (ref 26.0–34.0)
MCHC: 32.9 g/dL (ref 30.0–36.0)
MCV: 94.2 fL (ref 80.0–100.0)
Platelets: 192 K/uL (ref 150–400)
RBC: 4.45 MIL/uL (ref 3.87–5.11)
RDW: 12.7 % (ref 11.5–15.5)
WBC: 8.8 K/uL (ref 4.0–10.5)
nRBC: 0 % (ref 0.0–0.2)

## 2024-03-24 NOTE — Care Management Obs Status (Signed)
 MEDICARE OBSERVATION STATUS NOTIFICATION   Patient Details  Name: Victoria Dalton MRN: [id number] Date of Birth: [birth year]   Medicare Observation Status Notification Given:  Yes    Rojelio SHAUNNA Rattler 03/24/2024, 12:45 PM

## 2024-03-24 NOTE — TOC CM/SW Note (Signed)
 Transition of Care [facility]) - Inpatient Brief Assessment   Patient Details  Name: Victoria Dalton MRN: [id number] Date of Birth: [birth year]  Transition of Care [facility]) CM/SW Contact:    Daved JONETTA Hamilton, RN Phone Number: 03/24/2024, 8:42 AM   Clinical Narrative:   Transition of Care [facility]) Screening Note   Patient Details  Name: Victoria Dalton Date of Birth: 1931/04/07   Transition of Care [facility]) CM/SW Contact:    Daved JONETTA Hamilton, RN Phone Number: 03/24/2024, 8:42 AM    Transition of Care Department [facility]) has reviewed patient and no TOC needs have been identified at this time.  If new patient transition needs arise, please place a TOC consult.    Transition of Care Asessment: Insurance and Status: Insurance coverage has been reviewed Patient has primary care physician: Yes   Prior level of function:: Independent Prior/Current Home Services: No current home services Social Drivers of Health Review: SDOH reviewed no interventions necessary Readmission risk has been reviewed: No (Patient in observation status- no score generated) Transition of care needs: no transition of care needs at this time

## 2024-03-24 NOTE — Discharge Summary (Signed)
 Physician Discharge Summary   Patient: Victoria Dalton MRN: [id number] DOB: 01/14/1931  Admit date:     03/23/2024  Discharge date: 03/24/24  Discharge Physician: Murvin Mana   PCP: Glover Lenis, MD   Recommendations at discharge:   Follow-up with PCP in 1 week. Follow-up with GI as scheduled.  Discharge Diagnoses: Principal Problem:   Lower GI bleed Active Problems:   Atrial fibrillation with RVR (HCC)  Resolved Problems:   * No resolved hospital problems. *  Hospital Course:  Victoria Dalton is a 89 y.o. female with medical history significant of PAF on Eliquis , IIDM, HTN, H LD, presented with new onset of rectal bleed.  Patient was monitored overnight, hemoglobin has been normal.  Bleeding seems to have stopped today, she had 2 bowel movements, no blood. At this point, she is medically stable for discharge.  She has an scheduled appointment with GI in the next few days.  Assessment and Plan: Rectal bleeding. Etiology still unclear, this is related to Eliquis .  Eliquis  has since been discontinued, no additional bleeding.  Medically stable for discharge.  Hemoglobin still normal today. Per GI, no workup is needed as inpatient  Chronic severe fibrillation with RVR. Resume home treatment, heart rate better controlled.  Discontinued Eliquis .  Type 2 diabetes Follow-up with PCP as outpatient.  Class III obesity with BMI 44.26. Diet and exercise.      Consultants: GI Procedures performed: None  Disposition: Home health Diet recommendation:  Discharge Diet Orders (From admission, onward)     Start     Ordered   03/24/24 0000  Diet - low sodium heart healthy        03/24/24 1031           Cardiac diet DISCHARGE MEDICATION: Allergies as of 03/24/2024       Reactions   Chlorhexidine  Itching   Ramipril Other (See Comments)   IRREGULAR HEART BEAT   Penicillins Rash   Has patient had a PCN reaction causing immediate rash, facial/tongue/throat  swelling, SOB or lightheadedness with hypotension: Yes Has patient had a PCN reaction causing severe rash involving mucus membranes or skin necrosis: Yes Has patient had a PCN reaction that required hospitalization No Has patient had a PCN reaction occurring within the last 10 years: No If all of the above answers are NO, then may proceed with Cephalosporin use.   Poison Oak Extract Rash   Sulfa Antibiotics Rash        Medication List     STOP taking these medications    apixaban  5 MG Tabs tablet Commonly known as: ELIQUIS        TAKE these medications    acetaminophen  650 MG CR tablet Commonly known as: TYLENOL  Take 650 mg by mouth every 8 (eight) hours as needed for pain. What changed: Another medication with the same name was removed. Continue taking this medication, and follow the directions you see here.   carboxymethylcellulose 0.5 % Soln Commonly known as: REFRESH PLUS 1 drop 3 (three) times daily as needed.   diltiazem  240 MG 24 hr capsule Commonly known as: CARDIZEM  CD Take 1 capsule (240 mg total) by mouth daily.   furosemide  40 MG tablet Commonly known as: LASIX  Take 1 tablet (40 mg total) by mouth every Monday, Wednesday, and Friday. What changed:  how much to take additional instructions   glipiZIDE 5 MG tablet Commonly known as: GLUCOTROL Take 5-10 mg by mouth daily. 2 tabs in the morning and one tab at night  metoprolol  succinate 50 MG 24 hr tablet Commonly known as: TOPROL -XL Take 50 mg by mouth in the morning. Take with or immediately following a meal.   pantoprazole  40 MG tablet Commonly known as: PROTONIX  TAKE 1 TABLET(40 MG) BY MOUTH DAILY   PRESERVISION AREDS 2 PO Take by mouth.   simvastatin  40 MG tablet Commonly known as: ZOCOR  Take 1 tablet (40 mg total) by mouth daily.        Follow-up Information     Glover Lenis, MD Follow up in 1 week(s).   Specialty: Family Medicine Contact information: [address] [place] [address] [phone]         Jane Delmar Pike, NP Follow up.   Specialty: Gastroenterology Why: as scheduled Contact information: [address] [facility] [place] [address] [phone]                Discharge Exam: [name]   03/23/24 0724  Weight: 109.8 kg   General exam: Appears calm and comfortable, morbid obesity Respiratory system: Clear to auscultation. Respiratory effort normal. Cardiovascular system: S1 & S2 heard, RRR. No JVD, murmurs, rubs, gallops or clicks. No pedal edema. Gastrointestinal system: Abdomen is nondistended, soft and nontender. No organomegaly or masses felt. Normal bowel sounds heard. Central nervous system: Alert and oriented. No focal neurological deficits. Extremities: Symmetric 5 x 5 power. Skin: No rashes, lesions or ulcers Psychiatry: Judgement and insight appear normal. Mood & affect appropriate.    Condition at discharge: good  The results of significant diagnostics from this hospitalization (including imaging, microbiology, ancillary and laboratory) are listed below for reference.   Imaging Studies: No results found.  Microbiology: Results for orders placed or performed during the hospital encounter of 03/25/23  Culture, blood (Routine x 2)     Status: None   Collection Time: 03/25/23  6:14 PM   Specimen: BLOOD  Result Value Ref Range Status   Specimen Description BLOOD BLOOD LEFT FOREARM  Final   Special Requests   Final    BOTTLES DRAWN AEROBIC AND ANAEROBIC Blood Culture results may not be optimal due to an inadequate volume of blood received in culture bottles   Culture   Final    NO GROWTH 5 DAYS Performed at [facility], [address] [place], [place] [address]    Report Status 03/30/2023 FINAL  Final  Resp panel by RT-PCR (RSV, Flu A&B, Covid) Anterior Nasal Swab     Status: None   Collection Time: 03/25/23  6:49 PM   Specimen: Anterior Nasal Swab   Result Value Ref Range Status   SARS Coronavirus 2 by RT PCR NEGATIVE NEGATIVE Final    Comment: (NOTE) SARS-CoV-2 target nucleic acids are NOT DETECTED.  The SARS-CoV-2 RNA is generally detectable in upper respiratory specimens during the acute phase of infection. The lowest concentration of SARS-CoV-2 viral copies this assay can detect is 138 copies/mL. A negative result does not preclude SARS-Cov-2 infection and should not be used as the sole basis for treatment or other patient management decisions. A negative result may occur with  improper specimen collection/handling, submission of specimen other than nasopharyngeal swab, presence of viral mutation(s) within the areas targeted by this assay, and inadequate number of viral copies(<138 copies/mL). A negative result must be combined with clinical observations, patient history, and epidemiological information. The expected result is Negative.  Fact Sheet for Patients:  bloggercourse.com  Fact Sheet for Healthcare Providers:  seriousbroker.it  This test is no  t yet approved or cleared by the [place]  FDA and  has been authorized for detection and/or diagnosis of SARS-CoV-2 by FDA under an Emergency Use Authorization (EUA). This EUA will remain  in effect (meaning this test can be used) for the duration of the COVID-19 declaration under Section 564(b)(1) of the Act, 21 U.S.C.section 360bbb-3(b)(1), unless the authorization is terminated  or revoked sooner.       Influenza A by PCR NEGATIVE NEGATIVE Final   Influenza B by PCR NEGATIVE NEGATIVE Final    Comment: (NOTE) The Xpert Xpress SARS-CoV-2/FLU/RSV plus assay is intended as an aid in the diagnosis of influenza from Nasopharyngeal swab specimens and should not be used as a sole basis for treatment. Nasal washings and aspirates are unacceptable for Xpert Xpress SARS-CoV-2/FLU/RSV testing.  Fact Sheet for  Patients: bloggercourse.com  Fact Sheet for Healthcare Providers: seriousbroker.it  This test is not yet approved or cleared by the [place]  FDA and has been authorized for detection and/or diagnosis of SARS-CoV-2 by FDA under an Emergency Use Authorization (EUA). This EUA will remain in effect (meaning this test can be used) for the duration of the COVID-19 declaration under Section 564(b)(1) of the Act, 21 U.S.C. section 360bbb-3(b)(1), unless the authorization is terminated or revoked.     Resp Syncytial Virus by PCR NEGATIVE NEGATIVE Final    Comment: (NOTE) Fact Sheet for Patients: bloggercourse.com  Fact Sheet for Healthcare Providers: seriousbroker.it  This test is not yet approved or cleared by the [place]  FDA and has been authorized for detection and/or diagnosis of SARS-CoV-2 by FDA under an Emergency Use Authorization (EUA). This EUA will remain in effect (meaning this test can be used) for the duration of the COVID-19 declaration under Section 564(b)(1) of the Act, 21 U.S.C. section 360bbb-3(b)(1), unless the authorization is terminated or revoked.  Performed at [facility], [address] [place], [place] [address]   Urine Culture     Status: Abnormal   Collection Time: 03/25/23 10:08 PM   Specimen: Urine, Random  Result Value Ref Range Status   Specimen Description   Final    URINE, RANDOM Performed at [facility], [address] [place], [place] [address]    Special Requests   Final    NONE Reflexed from [phone] Performed at [facility], [address] [place], [place] [address]    Culture >=100,000 COLONIES/mL ESCHERICHIA COLI (A)  Final   Report Status 03/28/2023 FINAL  Final   Organism ID, Bacteria ESCHERICHIA COLI (A)  Final      Susceptibility   Escherichia coli - MIC*    AMPICILLIN >=32 RESISTANT  Resistant     CEFAZOLIN <=4 SENSITIVE Sensitive     CEFEPIME <=0.12 SENSITIVE Sensitive     CEFTRIAXONE  <=0.25 SENSITIVE Sensitive     CIPROFLOXACIN  <=0.25 SENSITIVE Sensitive     GENTAMICIN  <=1 SENSITIVE Sensitive     IMIPENEM <=0.25 SENSITIVE Sensitive     NITROFURANTOIN <=16 SENSITIVE Sensitive     TRIMETH/SULFA <=20 SENSITIVE Sensitive     AMPICILLIN/SULBACTAM 8 SENSITIVE Sensitive     PIP/TAZO <=4 SENSITIVE Sensitive ug/mL    * >=100,000 COLONIES/mL ESCHERICHIA COLI  Culture, blood (Routine x 2)     Status: None   Collection Time: 03/25/23 11:24 PM   Specimen: BLOOD  Result Value Ref Range Status   Specimen Description BLOOD BLOOD RIGHT HAND  Final   Special Requests   Final    BOTTLES DRAWN AEROBIC AND ANAEROBIC Blood  Culture adequate volume   Culture   Final    NO GROWTH 5 DAYS Performed at [facility], [address] [place], [place] [address]    Report Status 03/31/2023 FINAL  Final    Labs: CBC: Recent Labs  Lab 03/23/24 0742 03/23/24 1435 03/23/24 1934 03/24/24 0427  WBC 7.2  --   --  8.8  HGB 14.5 13.3 13.3 13.8  HCT 44.0 39.4 40.3 41.9  MCV 93.8  --   --  94.2  PLT 185  --   --  192   Basic Metabolic Panel: Recent Labs  Lab 03/23/24 0742 03/24/24 0427  NA 140 140  K 4.3 4.5  CL 102 103  CO2 26 26  GLUCOSE 177* 126*  BUN 24* 21  CREATININE 0.97 0.94  CALCIUM 9.1 9.4   Liver Function Tests: Recent Labs  Lab 03/23/24 0742  AST 22  ALT 12  ALKPHOS 102  BILITOT 1.0  PROT 7.7  ALBUMIN 4.1   CBG: Recent Labs  Lab 03/23/24 1140 03/23/24 1632 03/23/24 2202 03/24/24 0755  GLUCAP 171* 225* 109* 174*    Discharge time spent: 35 minutes.  Signed: Murvin Mana, MD [facility] 03/24/2024

## 2024-03-24 NOTE — Plan of Care (Signed)

## 2024-03-24 NOTE — Care Management Obs Status (Signed)
 MEDICARE OBSERVATION STATUS NOTIFICATION   Patient Details  Name: Victoria Dalton MRN: [id number] Date of Birth: 1931/02/23   Medicare Observation Status Notification Given:  Yes    Rojelio SHAUNNA Rattler 03/24/2024, 12:49 PM

## 2024-03-24 NOTE — TOC Initial Note (Signed)
 Transition of Care [facility]) - Initial/Assessment Note    Patient Details  Name: Victoria Dalton MRN: [id number] Date of Birth: 08-29-1930  Transition of [facility]) CM/SW Contact:    Daved JONETTA Hamilton, RN Phone Number: 03/24/2024, 12:39 PM  Clinical Narrative:                  Met with patient, patient daughter Joen at bedside. Introduced self and explained my role. Discussed that doctor has placed an order for home health services for physical therapy. Patient and daughter verbalized agreement with this plan. Patient and daughter presented with choice, daughter verbally requested [facility] and patient verbalized agreement with this.   Patient lives alone however daughter Larraine lives across the street. Kelsey manages patient appointments and medication and provides transportation when needed, otherwise, patient is independent in other ADL's  [facility] selected in [place] and [place] notified.  Expected Discharge Plan: Home w Home Health Services Barriers to Discharge: Barriers Resolved   Patient Goals and CMS Choice Patient states their goals for this hospitalization and ongoing recovery are:: To get back home   Choice offered to / list presented to : Patient, Adult Children      Expected Discharge Plan and Services   Discharge Planning Services: CM Consult   Living arrangements for the past 2 months: Single Family Home Expected Discharge Date: 03/24/24                         HH Arranged: PT HH Agency: [facility] Date [facility] Agency Contacted: 03/24/24 Time HH Agency Contacted: 1235 Representative spoke with at [facility] Agency: Larraine  Prior Living Arrangements/Services Living arrangements for the past 2 months: Single Family Home Lives with:: Self Patient language and need for interpreter reviewed:: Yes Do you feel safe going back to the place where you live?: Yes      Need for Family Participation in Patient Care: Yes (Comment) Care giver support system in place?: Yes  (comment)   Criminal Activity/Legal Involvement Pertinent to Current Situation/Hospitalization: No - Comment as needed  Activities of Daily Living      Permission Sought/Granted Permission sought to share information with : Family Supports Permission granted to share information with : Yes, Verbal Permission Granted  Share Information with NAME: Joen     Permission granted to share info w Relationship: Daughter  Permission granted to share info w Contact Information: [phone]  Emotional Assessment Appearance:: Appears stated age, Well-Groomed Attitude/Demeanor/Rapport: Engaged, Self-Confident Affect (typically observed): Appropriate Orientation: : Oriented to Self, Oriented to Place, Oriented to Situation Alcohol  / Substance Use: Not Applicable Psych Involvement: No (comment)  Admission diagnosis:  Rectal bleeding [K62.5] Lower GI bleed [K92.2] Patient Active Problem List   Diagnosis Date Noted   Lower GI bleed 03/23/2024   Atrial fibrillation, chronic (HCC) 03/26/2023   GERD without esophagitis 03/26/2023   Sepsis secondary to UTI (HCC) 03/26/2023   Sepsis due to gram-negative UTI (HCC) 03/25/2023   Atrial fibrillation with RVR (HCC) 03/08/2022   Chronic kidney disease (CKD), stage III (moderate) (HCC) 03/08/2022   Acute on chronic diastolic CHF (congestive heart failure) (HCC) 03/08/2022   Acute respiratory failure with hypoxia (HCC)    Lymph node enlargement 05/01/2021   Need for shingles vaccine 05/01/2021   Gastroenteritis 10/12/2020   Candidiasis of breast 05/31/2020   Need for influenza vaccination 03/29/2020   Class 3 severe obesity due to excess calories in adult [facility]) 11/16/2019   Vaginitis 10/25/2019  Blood in stool    Gallbladder mass    Cholangitis (HCC)    Cholecystitis    Right upper quadrant pain    Hyperbilirubinemia    Protein-calorie malnutrition, severe 01/11/2016   Cholecystitis with cholelithiasis 01/10/2016   Benign essential HTN  12/06/2015   Dyslipidemia 05/23/2015   Gastro-esophageal reflux disease without esophagitis 05/08/2014   Controlled type 2 diabetes mellitus without complication (HCC) 05/08/2014   Type 2 diabetes mellitus without complication 05/08/2014   PCP:  Glover Lenis, MD Pharmacy:   [id number] [place], [place] - [address] AT [facility] OF [address] [facility] [address] [place] [address] Phone: [phone] Fax: [phone]     Social Drivers of Health (SDOH) Social History: SDOH Screenings   Food Insecurity: No Food Insecurity (03/23/2024)  Housing: Unknown (03/23/2024)  Transportation Needs: No Transportation Needs (03/23/2024)  Utilities: Not At Risk (03/23/2024)  Alcohol  Screen: Low Risk  (02/21/2021)  Depression (PHQ2-9): Low Risk  (05/01/2021)  Financial Resource Strain: Low Risk  (03/11/2023)   Received from [facility]  Physical Activity: Inactive (02/21/2021)  Social Connections: Moderately Integrated (03/23/2024)  Stress: No Stress Concern Present (02/21/2021)  Tobacco Use: Low Risk  (03/23/2024)   SDOH Interventions: Housing Interventions: Patient Declined Transportation Interventions: Patient Declined Utilities Interventions: Patient Declined Social Connections Interventions: Patient Declined   Readmission Risk Interventions     No data to display

## 2024-07-11 ENCOUNTER — Encounter: Admitting: Family

## 2024-10-10 ENCOUNTER — Encounter (INDEPENDENT_AMBULATORY_CARE_PROVIDER_SITE_OTHER): Admitting: Ophthalmology
